# Patient Record
Sex: Female | Born: 1935 | Race: White | Hispanic: No | Marital: Married | Smoking: Former smoker
Health system: Southern US, Community
[De-identification: ages and names within clinical notes are randomized; demographics above are authoritative.]

## PROBLEM LIST (undated history)

## (undated) DIAGNOSIS — I10 Essential (primary) hypertension: Secondary | ICD-10-CM

## (undated) DIAGNOSIS — R570 Cardiogenic shock: Secondary | ICD-10-CM

## (undated) DIAGNOSIS — E785 Hyperlipidemia, unspecified: Secondary | ICD-10-CM

## (undated) DIAGNOSIS — I2510 Atherosclerotic heart disease of native coronary artery without angina pectoris: Secondary | ICD-10-CM

## (undated) DIAGNOSIS — R001 Bradycardia, unspecified: Secondary | ICD-10-CM

## (undated) DIAGNOSIS — J9600 Acute respiratory failure, unspecified whether with hypoxia or hypercapnia: Secondary | ICD-10-CM

## (undated) DIAGNOSIS — F329 Major depressive disorder, single episode, unspecified: Secondary | ICD-10-CM

## (undated) DIAGNOSIS — Z9861 Coronary angioplasty status: Secondary | ICD-10-CM

## (undated) DIAGNOSIS — I480 Paroxysmal atrial fibrillation: Secondary | ICD-10-CM

## (undated) DIAGNOSIS — I639 Cerebral infarction, unspecified: Secondary | ICD-10-CM

## (undated) DIAGNOSIS — I951 Orthostatic hypotension: Secondary | ICD-10-CM

## (undated) DIAGNOSIS — G43909 Migraine, unspecified, not intractable, without status migrainosus: Secondary | ICD-10-CM

## (undated) DIAGNOSIS — F32A Depression, unspecified: Secondary | ICD-10-CM

## (undated) DIAGNOSIS — D649 Anemia, unspecified: Secondary | ICD-10-CM

## (undated) DIAGNOSIS — I255 Ischemic cardiomyopathy: Secondary | ICD-10-CM

## (undated) DIAGNOSIS — E039 Hypothyroidism, unspecified: Secondary | ICD-10-CM

## (undated) HISTORY — DX: Cerebral infarction, unspecified: I63.9

## (undated) HISTORY — PX: SHOULDER SURGERY: SHX246

---

## 1998-03-21 ENCOUNTER — Other Ambulatory Visit: Admission: RE | Admit: 1998-03-21 | Discharge: 1998-03-21 | Payer: Self-pay | Admitting: Obstetrics and Gynecology

## 1998-07-11 ENCOUNTER — Ambulatory Visit (HOSPITAL_COMMUNITY): Admission: RE | Admit: 1998-07-11 | Discharge: 1998-07-11 | Payer: Self-pay | Admitting: Gastroenterology

## 1998-10-10 ENCOUNTER — Ambulatory Visit (HOSPITAL_COMMUNITY): Admission: RE | Admit: 1998-10-10 | Discharge: 1998-10-10 | Payer: Self-pay | Admitting: Specialist

## 1998-10-10 ENCOUNTER — Encounter: Payer: Self-pay | Admitting: Specialist

## 1999-05-15 ENCOUNTER — Other Ambulatory Visit: Admission: RE | Admit: 1999-05-15 | Discharge: 1999-05-15 | Payer: Self-pay | Admitting: Obstetrics and Gynecology

## 1999-09-09 ENCOUNTER — Encounter: Admission: RE | Admit: 1999-09-09 | Discharge: 1999-09-09 | Payer: Self-pay | Admitting: Obstetrics and Gynecology

## 1999-09-09 ENCOUNTER — Encounter: Payer: Self-pay | Admitting: Obstetrics and Gynecology

## 1999-09-30 ENCOUNTER — Encounter (INDEPENDENT_AMBULATORY_CARE_PROVIDER_SITE_OTHER): Payer: Self-pay

## 1999-09-30 ENCOUNTER — Other Ambulatory Visit: Admission: RE | Admit: 1999-09-30 | Discharge: 1999-09-30 | Payer: Self-pay | Admitting: Obstetrics and Gynecology

## 2000-03-10 ENCOUNTER — Encounter: Admission: RE | Admit: 2000-03-10 | Discharge: 2000-03-10 | Payer: Self-pay | Admitting: Obstetrics and Gynecology

## 2000-03-10 ENCOUNTER — Encounter: Payer: Self-pay | Admitting: Obstetrics and Gynecology

## 2000-04-19 ENCOUNTER — Other Ambulatory Visit: Admission: RE | Admit: 2000-04-19 | Discharge: 2000-04-19 | Payer: Self-pay | Admitting: Obstetrics and Gynecology

## 2000-11-03 ENCOUNTER — Encounter: Payer: Self-pay | Admitting: Gastroenterology

## 2000-11-03 ENCOUNTER — Encounter: Admission: RE | Admit: 2000-11-03 | Discharge: 2000-11-03 | Payer: Self-pay | Admitting: Gastroenterology

## 2000-12-22 ENCOUNTER — Encounter (INDEPENDENT_AMBULATORY_CARE_PROVIDER_SITE_OTHER): Payer: Self-pay

## 2000-12-22 ENCOUNTER — Ambulatory Visit (HOSPITAL_COMMUNITY): Admission: RE | Admit: 2000-12-22 | Discharge: 2000-12-22 | Payer: Self-pay | Admitting: Obstetrics and Gynecology

## 2001-03-14 ENCOUNTER — Encounter: Admission: RE | Admit: 2001-03-14 | Discharge: 2001-03-14 | Payer: Self-pay | Admitting: Gastroenterology

## 2001-03-14 ENCOUNTER — Encounter: Payer: Self-pay | Admitting: Gastroenterology

## 2001-04-25 ENCOUNTER — Other Ambulatory Visit: Admission: RE | Admit: 2001-04-25 | Discharge: 2001-04-25 | Payer: Self-pay | Admitting: Obstetrics and Gynecology

## 2001-04-27 ENCOUNTER — Encounter: Payer: Self-pay | Admitting: Obstetrics and Gynecology

## 2001-04-27 ENCOUNTER — Encounter: Admission: RE | Admit: 2001-04-27 | Discharge: 2001-04-27 | Payer: Self-pay | Admitting: Obstetrics and Gynecology

## 2002-03-21 ENCOUNTER — Encounter: Payer: Self-pay | Admitting: Obstetrics and Gynecology

## 2002-03-21 ENCOUNTER — Encounter: Admission: RE | Admit: 2002-03-21 | Discharge: 2002-03-21 | Payer: Self-pay | Admitting: Obstetrics and Gynecology

## 2002-05-01 ENCOUNTER — Other Ambulatory Visit: Admission: RE | Admit: 2002-05-01 | Discharge: 2002-05-01 | Payer: Self-pay | Admitting: Obstetrics and Gynecology

## 2003-04-02 ENCOUNTER — Encounter: Payer: Self-pay | Admitting: Obstetrics and Gynecology

## 2003-04-02 ENCOUNTER — Encounter: Admission: RE | Admit: 2003-04-02 | Discharge: 2003-04-02 | Payer: Self-pay | Admitting: Obstetrics and Gynecology

## 2003-05-08 ENCOUNTER — Other Ambulatory Visit: Admission: RE | Admit: 2003-05-08 | Discharge: 2003-05-08 | Payer: Self-pay | Admitting: Obstetrics and Gynecology

## 2003-05-30 ENCOUNTER — Encounter: Payer: Self-pay | Admitting: Emergency Medicine

## 2003-05-30 ENCOUNTER — Emergency Department (HOSPITAL_COMMUNITY): Admission: EM | Admit: 2003-05-30 | Discharge: 2003-05-30 | Payer: Self-pay | Admitting: Emergency Medicine

## 2003-06-11 ENCOUNTER — Encounter: Admission: RE | Admit: 2003-06-11 | Discharge: 2003-06-11 | Payer: Self-pay | Admitting: Gastroenterology

## 2003-06-11 ENCOUNTER — Encounter: Payer: Self-pay | Admitting: Gastroenterology

## 2003-08-10 ENCOUNTER — Ambulatory Visit (HOSPITAL_BASED_OUTPATIENT_CLINIC_OR_DEPARTMENT_OTHER): Admission: RE | Admit: 2003-08-10 | Discharge: 2003-08-10 | Payer: Self-pay | Admitting: Gastroenterology

## 2004-01-10 ENCOUNTER — Ambulatory Visit (HOSPITAL_COMMUNITY): Admission: RE | Admit: 2004-01-10 | Discharge: 2004-01-10 | Payer: Self-pay | Admitting: Family Medicine

## 2004-04-18 ENCOUNTER — Encounter: Admission: RE | Admit: 2004-04-18 | Discharge: 2004-04-18 | Payer: Self-pay | Admitting: Gastroenterology

## 2004-05-16 ENCOUNTER — Encounter: Admission: RE | Admit: 2004-05-16 | Discharge: 2004-05-16 | Payer: Self-pay | Admitting: Obstetrics and Gynecology

## 2005-03-04 ENCOUNTER — Ambulatory Visit: Payer: Self-pay | Admitting: Internal Medicine

## 2005-06-01 ENCOUNTER — Encounter: Admission: RE | Admit: 2005-06-01 | Discharge: 2005-06-01 | Payer: Self-pay | Admitting: Obstetrics and Gynecology

## 2006-06-07 ENCOUNTER — Encounter: Admission: RE | Admit: 2006-06-07 | Discharge: 2006-06-07 | Payer: Self-pay | Admitting: Obstetrics and Gynecology

## 2006-12-30 ENCOUNTER — Encounter: Admission: RE | Admit: 2006-12-30 | Discharge: 2006-12-30 | Payer: Self-pay | Admitting: Gastroenterology

## 2007-06-29 ENCOUNTER — Encounter: Admission: RE | Admit: 2007-06-29 | Discharge: 2007-06-29 | Payer: Self-pay | Admitting: Obstetrics and Gynecology

## 2008-07-11 ENCOUNTER — Encounter: Admission: RE | Admit: 2008-07-11 | Discharge: 2008-07-11 | Payer: Self-pay | Admitting: Obstetrics and Gynecology

## 2008-07-30 ENCOUNTER — Ambulatory Visit (HOSPITAL_COMMUNITY): Admission: RE | Admit: 2008-07-30 | Discharge: 2008-07-30 | Payer: Self-pay | Admitting: Gastroenterology

## 2008-08-06 ENCOUNTER — Emergency Department (HOSPITAL_COMMUNITY): Admission: EM | Admit: 2008-08-06 | Discharge: 2008-08-06 | Payer: Self-pay | Admitting: Emergency Medicine

## 2009-02-15 ENCOUNTER — Encounter: Admission: RE | Admit: 2009-02-15 | Discharge: 2009-02-15 | Payer: Self-pay | Admitting: Gastroenterology

## 2009-07-31 ENCOUNTER — Encounter: Admission: RE | Admit: 2009-07-31 | Discharge: 2009-07-31 | Payer: Self-pay | Admitting: Obstetrics and Gynecology

## 2010-08-05 ENCOUNTER — Encounter: Admission: RE | Admit: 2010-08-05 | Discharge: 2010-08-05 | Payer: Self-pay | Admitting: Obstetrics and Gynecology

## 2010-11-08 ENCOUNTER — Encounter: Payer: Self-pay | Admitting: Obstetrics and Gynecology

## 2011-02-03 ENCOUNTER — Other Ambulatory Visit (HOSPITAL_COMMUNITY)
Admission: RE | Admit: 2011-02-03 | Discharge: 2011-02-03 | Disposition: A | Discharge status: Home / Self Care | Payer: Medicare Other | Source: Ambulatory Visit | Attending: Internal Medicine | Admitting: Internal Medicine

## 2011-02-03 ENCOUNTER — Other Ambulatory Visit: Payer: Self-pay | Admitting: Internal Medicine

## 2011-02-03 DIAGNOSIS — Z124 Encounter for screening for malignant neoplasm of cervix: Secondary | ICD-10-CM | POA: Insufficient documentation

## 2011-02-03 DIAGNOSIS — Z1159 Encounter for screening for other viral diseases: Secondary | ICD-10-CM | POA: Insufficient documentation

## 2011-03-06 NOTE — Op Note (Signed)
[facility] [place] [facility] [place]  Patient:    Melinda Noble, Melinda Noble                          MRN: [id number] Proc. Date: 12/22/00 Adm. Date:  [id number] Attending:  Morene Antu                           Operative Report  PREOPERATIVE DIAGNOSES:       1. Postmenopausal bleeding.                               2. Endocervical mass.  POSTOPERATIVE DIAGNOSES:      1. Postmenopausal bleeding.                               2. Endocervical mass.  PROCEDURE:                    Dilation and curettage hysteroscopy through                               resectoscopic excision of cervical mass.  SURGEON:                      Sherry A. Rosalio Macadamia, M.D.  ANESTHESIA:                   MAC.  INDICATIONS:                  This is a 77 year old G4, P4-0-0-4 woman, who has had intermittent postmenopausal bleeding for several months.  The patient was initially treated by lowering her estrogen dose and increasing her progesterone dose.  The patient had approximately one month of no bleeding; however, the bleeding has persisted since the middle of January.  Because of this, an ultrasound was performed.  Ultrasound revealed normal uterus with endometrial stripe; however, there was an approximately .5 cm mass in the high endocervical cavity.  Because of this, the patient is brought to the operating room for [facility] hysteroscopy with the resectoscope.  FINDINGS:                     Normal size anteflexed uterus, high endocervical mass.  DESCRIPTION OF PROCEDURE:     The patient was brought into the operating room and given adequate IV sedation.  She was placed in dorsal lithotomy position. Her perineum was washed with Hibiclens.  Pelvic examination was performed. The patient was draped in a sterile fashion.  Speculum was placed within the vagina.  The vagina was washed with Hibiclens.  Paracervical block was administered with 1% Nesacaine.  Anterior lip of the cervix was grasped with  a single-tooth tenaculum.  The cervix was sounded.  The cervix was dilated with Pratt dilators to a #31.  Hysteroscope was easily introduced into the endometrial cavity.  Pictures were obtained.  Superficial samples in the endometrium were obtained circumferentially.  The cervical mass was then excised using double-loop right-angled resector.  This was excised in its entirety.  Final picture was obtained.  The speculum was placed back in the vagina.  Adequate hemostasis was present.  All instruments were removed from the vagina.  The patient was taken out of the dorsal lithotomy  position.  She was awakened.  She was moved from the operating table to a stretcher in stable condition.  Complications were none.  Estimated blood loss less than 5 cc. Sorbitol differential -210. DD:  12/22/00 TD:  12/22/00 Job: [id number]

## 2011-07-03 ENCOUNTER — Other Ambulatory Visit: Payer: Self-pay | Admitting: Internal Medicine

## 2011-07-03 DIAGNOSIS — Z1231 Encounter for screening mammogram for malignant neoplasm of breast: Secondary | ICD-10-CM

## 2011-08-11 ENCOUNTER — Ambulatory Visit
Admission: RE | Admit: 2011-08-11 | Discharge: 2011-08-11 | Disposition: A | Discharge status: Home / Self Care | Payer: Medicare Other | Source: Ambulatory Visit | Attending: Internal Medicine | Admitting: Internal Medicine

## 2011-08-11 DIAGNOSIS — Z1231 Encounter for screening mammogram for malignant neoplasm of breast: Secondary | ICD-10-CM

## 2011-08-14 ENCOUNTER — Other Ambulatory Visit: Payer: Self-pay | Admitting: Gastroenterology

## 2011-10-29 DIAGNOSIS — L820 Inflamed seborrheic keratosis: Secondary | ICD-10-CM | POA: Diagnosis not present

## 2011-12-24 DIAGNOSIS — L820 Inflamed seborrheic keratosis: Secondary | ICD-10-CM | POA: Diagnosis not present

## 2011-12-24 DIAGNOSIS — L738 Other specified follicular disorders: Secondary | ICD-10-CM | POA: Diagnosis not present

## 2011-12-24 DIAGNOSIS — L259 Unspecified contact dermatitis, unspecified cause: Secondary | ICD-10-CM | POA: Diagnosis not present

## 2012-01-28 DIAGNOSIS — L259 Unspecified contact dermatitis, unspecified cause: Secondary | ICD-10-CM | POA: Diagnosis not present

## 2012-01-28 DIAGNOSIS — B079 Viral wart, unspecified: Secondary | ICD-10-CM | POA: Diagnosis not present

## 2012-02-08 DIAGNOSIS — I10 Essential (primary) hypertension: Secondary | ICD-10-CM | POA: Diagnosis not present

## 2012-02-08 DIAGNOSIS — E039 Hypothyroidism, unspecified: Secondary | ICD-10-CM | POA: Diagnosis not present

## 2012-02-08 DIAGNOSIS — R05 Cough: Secondary | ICD-10-CM | POA: Diagnosis not present

## 2012-02-08 DIAGNOSIS — M76899 Other specified enthesopathies of unspecified lower limb, excluding foot: Secondary | ICD-10-CM | POA: Diagnosis not present

## 2012-02-08 DIAGNOSIS — K219 Gastro-esophageal reflux disease without esophagitis: Secondary | ICD-10-CM | POA: Diagnosis not present

## 2012-02-08 DIAGNOSIS — R0789 Other chest pain: Secondary | ICD-10-CM | POA: Diagnosis not present

## 2012-02-08 DIAGNOSIS — Z1331 Encounter for screening for depression: Secondary | ICD-10-CM | POA: Diagnosis not present

## 2012-02-08 DIAGNOSIS — Z86010 Personal history of colonic polyps: Secondary | ICD-10-CM | POA: Diagnosis not present

## 2012-02-08 DIAGNOSIS — Z0000 Encounter for general adult medical examination without abnormal findings: Secondary | ICD-10-CM | POA: Diagnosis not present

## 2012-02-08 DIAGNOSIS — E559 Vitamin D deficiency, unspecified: Secondary | ICD-10-CM | POA: Diagnosis not present

## 2012-02-08 DIAGNOSIS — Z79899 Other long term (current) drug therapy: Secondary | ICD-10-CM | POA: Diagnosis not present

## 2012-02-09 DIAGNOSIS — J301 Allergic rhinitis due to pollen: Secondary | ICD-10-CM | POA: Diagnosis not present

## 2012-02-09 DIAGNOSIS — Z1331 Encounter for screening for depression: Secondary | ICD-10-CM | POA: Diagnosis not present

## 2012-02-09 DIAGNOSIS — K219 Gastro-esophageal reflux disease without esophagitis: Secondary | ICD-10-CM | POA: Diagnosis not present

## 2012-02-09 DIAGNOSIS — R0789 Other chest pain: Secondary | ICD-10-CM | POA: Diagnosis not present

## 2012-02-09 DIAGNOSIS — Z0000 Encounter for general adult medical examination without abnormal findings: Secondary | ICD-10-CM | POA: Diagnosis not present

## 2012-02-09 DIAGNOSIS — R05 Cough: Secondary | ICD-10-CM | POA: Diagnosis not present

## 2012-02-09 DIAGNOSIS — Z86010 Personal history of colonic polyps: Secondary | ICD-10-CM | POA: Diagnosis not present

## 2012-02-09 DIAGNOSIS — E559 Vitamin D deficiency, unspecified: Secondary | ICD-10-CM | POA: Diagnosis not present

## 2012-03-23 DIAGNOSIS — Z961 Presence of intraocular lens: Secondary | ICD-10-CM | POA: Diagnosis not present

## 2012-03-23 DIAGNOSIS — H26499 Other secondary cataract, unspecified eye: Secondary | ICD-10-CM | POA: Diagnosis not present

## 2012-03-23 DIAGNOSIS — H31009 Unspecified chorioretinal scars, unspecified eye: Secondary | ICD-10-CM | POA: Diagnosis not present

## 2012-03-23 DIAGNOSIS — H1045 Other chronic allergic conjunctivitis: Secondary | ICD-10-CM | POA: Diagnosis not present

## 2012-04-29 DIAGNOSIS — L821 Other seborrheic keratosis: Secondary | ICD-10-CM | POA: Diagnosis not present

## 2012-04-29 DIAGNOSIS — L820 Inflamed seborrheic keratosis: Secondary | ICD-10-CM | POA: Diagnosis not present

## 2012-06-03 DIAGNOSIS — IMO0002 Reserved for concepts with insufficient information to code with codable children: Secondary | ICD-10-CM | POA: Diagnosis not present

## 2012-06-07 DIAGNOSIS — M545 Low back pain: Secondary | ICD-10-CM | POA: Diagnosis not present

## 2012-06-07 DIAGNOSIS — IMO0002 Reserved for concepts with insufficient information to code with codable children: Secondary | ICD-10-CM | POA: Diagnosis not present

## 2012-06-10 DIAGNOSIS — M545 Low back pain: Secondary | ICD-10-CM | POA: Diagnosis not present

## 2012-06-10 DIAGNOSIS — IMO0002 Reserved for concepts with insufficient information to code with codable children: Secondary | ICD-10-CM | POA: Diagnosis not present

## 2012-06-13 DIAGNOSIS — M545 Low back pain: Secondary | ICD-10-CM | POA: Diagnosis not present

## 2012-06-13 DIAGNOSIS — IMO0002 Reserved for concepts with insufficient information to code with codable children: Secondary | ICD-10-CM | POA: Diagnosis not present

## 2012-06-17 DIAGNOSIS — IMO0002 Reserved for concepts with insufficient information to code with codable children: Secondary | ICD-10-CM | POA: Diagnosis not present

## 2012-06-17 DIAGNOSIS — M545 Low back pain: Secondary | ICD-10-CM | POA: Diagnosis not present

## 2012-06-21 DIAGNOSIS — M545 Low back pain: Secondary | ICD-10-CM | POA: Diagnosis not present

## 2012-06-21 DIAGNOSIS — IMO0002 Reserved for concepts with insufficient information to code with codable children: Secondary | ICD-10-CM | POA: Diagnosis not present

## 2012-06-24 DIAGNOSIS — M545 Low back pain: Secondary | ICD-10-CM | POA: Diagnosis not present

## 2012-06-24 DIAGNOSIS — IMO0002 Reserved for concepts with insufficient information to code with codable children: Secondary | ICD-10-CM | POA: Diagnosis not present

## 2012-06-28 DIAGNOSIS — IMO0002 Reserved for concepts with insufficient information to code with codable children: Secondary | ICD-10-CM | POA: Diagnosis not present

## 2012-06-28 DIAGNOSIS — M545 Low back pain: Secondary | ICD-10-CM | POA: Diagnosis not present

## 2012-06-30 DIAGNOSIS — IMO0002 Reserved for concepts with insufficient information to code with codable children: Secondary | ICD-10-CM | POA: Diagnosis not present

## 2012-06-30 DIAGNOSIS — M545 Low back pain: Secondary | ICD-10-CM | POA: Diagnosis not present

## 2012-07-06 ENCOUNTER — Other Ambulatory Visit: Payer: Self-pay | Admitting: Internal Medicine

## 2012-07-06 DIAGNOSIS — IMO0002 Reserved for concepts with insufficient information to code with codable children: Secondary | ICD-10-CM | POA: Diagnosis not present

## 2012-07-06 DIAGNOSIS — M545 Low back pain: Secondary | ICD-10-CM | POA: Diagnosis not present

## 2012-07-06 DIAGNOSIS — Z1231 Encounter for screening mammogram for malignant neoplasm of breast: Secondary | ICD-10-CM

## 2012-07-08 DIAGNOSIS — IMO0002 Reserved for concepts with insufficient information to code with codable children: Secondary | ICD-10-CM | POA: Diagnosis not present

## 2012-07-08 DIAGNOSIS — M545 Low back pain: Secondary | ICD-10-CM | POA: Diagnosis not present

## 2012-07-26 DIAGNOSIS — I10 Essential (primary) hypertension: Secondary | ICD-10-CM | POA: Diagnosis not present

## 2012-07-26 DIAGNOSIS — R252 Cramp and spasm: Secondary | ICD-10-CM | POA: Diagnosis not present

## 2012-07-26 DIAGNOSIS — K5900 Constipation, unspecified: Secondary | ICD-10-CM | POA: Diagnosis not present

## 2012-07-26 DIAGNOSIS — IMO0002 Reserved for concepts with insufficient information to code with codable children: Secondary | ICD-10-CM | POA: Diagnosis not present

## 2012-07-26 DIAGNOSIS — Z0000 Encounter for general adult medical examination without abnormal findings: Secondary | ICD-10-CM | POA: Diagnosis not present

## 2012-07-26 DIAGNOSIS — E559 Vitamin D deficiency, unspecified: Secondary | ICD-10-CM | POA: Diagnosis not present

## 2012-07-26 DIAGNOSIS — Z23 Encounter for immunization: Secondary | ICD-10-CM | POA: Diagnosis not present

## 2012-07-26 DIAGNOSIS — K219 Gastro-esophageal reflux disease without esophagitis: Secondary | ICD-10-CM | POA: Diagnosis not present

## 2012-07-26 DIAGNOSIS — M76899 Other specified enthesopathies of unspecified lower limb, excluding foot: Secondary | ICD-10-CM | POA: Diagnosis not present

## 2012-07-27 DIAGNOSIS — M545 Low back pain: Secondary | ICD-10-CM | POA: Diagnosis not present

## 2012-07-27 DIAGNOSIS — IMO0002 Reserved for concepts with insufficient information to code with codable children: Secondary | ICD-10-CM | POA: Diagnosis not present

## 2012-08-17 ENCOUNTER — Ambulatory Visit
Admission: RE | Admit: 2012-08-17 | Discharge: 2012-08-17 | Disposition: A | Discharge status: Home / Self Care | Payer: Medicare Other | Source: Ambulatory Visit | Attending: Internal Medicine | Admitting: Internal Medicine

## 2012-08-17 DIAGNOSIS — Z1231 Encounter for screening mammogram for malignant neoplasm of breast: Secondary | ICD-10-CM | POA: Diagnosis not present

## 2012-08-26 ENCOUNTER — Other Ambulatory Visit: Payer: Self-pay | Admitting: Dermatology

## 2012-08-26 DIAGNOSIS — L851 Acquired keratosis [keratoderma] palmaris et plantaris: Secondary | ICD-10-CM | POA: Diagnosis not present

## 2012-08-26 DIAGNOSIS — D235 Other benign neoplasm of skin of trunk: Secondary | ICD-10-CM | POA: Diagnosis not present

## 2012-08-26 DIAGNOSIS — Z85828 Personal history of other malignant neoplasm of skin: Secondary | ICD-10-CM | POA: Diagnosis not present

## 2012-10-25 DIAGNOSIS — F4320 Adjustment disorder, unspecified: Secondary | ICD-10-CM | POA: Diagnosis not present

## 2012-11-10 DIAGNOSIS — Z85828 Personal history of other malignant neoplasm of skin: Secondary | ICD-10-CM | POA: Diagnosis not present

## 2012-11-10 DIAGNOSIS — B353 Tinea pedis: Secondary | ICD-10-CM | POA: Diagnosis not present

## 2012-11-10 DIAGNOSIS — L821 Other seborrheic keratosis: Secondary | ICD-10-CM | POA: Diagnosis not present

## 2012-11-10 DIAGNOSIS — D1801 Hemangioma of skin and subcutaneous tissue: Secondary | ICD-10-CM | POA: Diagnosis not present

## 2012-11-10 DIAGNOSIS — L819 Disorder of pigmentation, unspecified: Secondary | ICD-10-CM | POA: Diagnosis not present

## 2012-11-10 DIAGNOSIS — L820 Inflamed seborrheic keratosis: Secondary | ICD-10-CM | POA: Diagnosis not present

## 2012-11-10 DIAGNOSIS — L723 Sebaceous cyst: Secondary | ICD-10-CM | POA: Diagnosis not present

## 2012-11-22 DIAGNOSIS — M25579 Pain in unspecified ankle and joints of unspecified foot: Secondary | ICD-10-CM | POA: Diagnosis not present

## 2012-11-22 DIAGNOSIS — M66369 Spontaneous rupture of flexor tendons, unspecified lower leg: Secondary | ICD-10-CM | POA: Diagnosis not present

## 2012-12-05 DIAGNOSIS — M7660 Achilles tendinitis, unspecified leg: Secondary | ICD-10-CM | POA: Diagnosis not present

## 2012-12-05 DIAGNOSIS — R269 Unspecified abnormalities of gait and mobility: Secondary | ICD-10-CM | POA: Diagnosis not present

## 2012-12-07 DIAGNOSIS — M7660 Achilles tendinitis, unspecified leg: Secondary | ICD-10-CM | POA: Diagnosis not present

## 2012-12-07 DIAGNOSIS — R269 Unspecified abnormalities of gait and mobility: Secondary | ICD-10-CM | POA: Diagnosis not present

## 2012-12-08 DIAGNOSIS — M7660 Achilles tendinitis, unspecified leg: Secondary | ICD-10-CM | POA: Diagnosis not present

## 2012-12-08 DIAGNOSIS — R269 Unspecified abnormalities of gait and mobility: Secondary | ICD-10-CM | POA: Diagnosis not present

## 2012-12-13 DIAGNOSIS — R269 Unspecified abnormalities of gait and mobility: Secondary | ICD-10-CM | POA: Diagnosis not present

## 2012-12-13 DIAGNOSIS — M7660 Achilles tendinitis, unspecified leg: Secondary | ICD-10-CM | POA: Diagnosis not present

## 2012-12-15 DIAGNOSIS — R269 Unspecified abnormalities of gait and mobility: Secondary | ICD-10-CM | POA: Diagnosis not present

## 2012-12-15 DIAGNOSIS — M7660 Achilles tendinitis, unspecified leg: Secondary | ICD-10-CM | POA: Diagnosis not present

## 2012-12-26 DIAGNOSIS — R269 Unspecified abnormalities of gait and mobility: Secondary | ICD-10-CM | POA: Diagnosis not present

## 2012-12-26 DIAGNOSIS — M7660 Achilles tendinitis, unspecified leg: Secondary | ICD-10-CM | POA: Diagnosis not present

## 2013-01-03 DIAGNOSIS — R269 Unspecified abnormalities of gait and mobility: Secondary | ICD-10-CM | POA: Diagnosis not present

## 2013-01-03 DIAGNOSIS — M7660 Achilles tendinitis, unspecified leg: Secondary | ICD-10-CM | POA: Diagnosis not present

## 2013-01-06 DIAGNOSIS — M7660 Achilles tendinitis, unspecified leg: Secondary | ICD-10-CM | POA: Diagnosis not present

## 2013-01-06 DIAGNOSIS — R269 Unspecified abnormalities of gait and mobility: Secondary | ICD-10-CM | POA: Diagnosis not present

## 2013-01-09 DIAGNOSIS — M25579 Pain in unspecified ankle and joints of unspecified foot: Secondary | ICD-10-CM | POA: Diagnosis not present

## 2013-01-09 DIAGNOSIS — M25519 Pain in unspecified shoulder: Secondary | ICD-10-CM | POA: Diagnosis not present

## 2013-01-10 DIAGNOSIS — R269 Unspecified abnormalities of gait and mobility: Secondary | ICD-10-CM | POA: Diagnosis not present

## 2013-01-10 DIAGNOSIS — M7660 Achilles tendinitis, unspecified leg: Secondary | ICD-10-CM | POA: Diagnosis not present

## 2013-01-11 DIAGNOSIS — I10 Essential (primary) hypertension: Secondary | ICD-10-CM | POA: Diagnosis not present

## 2013-01-11 DIAGNOSIS — F329 Major depressive disorder, single episode, unspecified: Secondary | ICD-10-CM | POA: Diagnosis not present

## 2013-01-18 DIAGNOSIS — M5412 Radiculopathy, cervical region: Secondary | ICD-10-CM | POA: Diagnosis not present

## 2013-01-18 DIAGNOSIS — M75120 Complete rotator cuff tear or rupture of unspecified shoulder, not specified as traumatic: Secondary | ICD-10-CM | POA: Diagnosis not present

## 2013-01-27 ENCOUNTER — Emergency Department (HOSPITAL_COMMUNITY): Payer: Medicare Other

## 2013-01-27 ENCOUNTER — Encounter (HOSPITAL_COMMUNITY): Payer: Self-pay | Admitting: Emergency Medicine

## 2013-01-27 ENCOUNTER — Emergency Department (HOSPITAL_COMMUNITY)
Admission: EM | Admit: 2013-01-27 | Discharge: 2013-01-27 | Disposition: A | Discharge status: Home / Self Care | Payer: Medicare Other | Attending: Emergency Medicine | Admitting: Emergency Medicine

## 2013-01-27 DIAGNOSIS — M545 Low back pain: Secondary | ICD-10-CM | POA: Diagnosis not present

## 2013-01-27 DIAGNOSIS — M542 Cervicalgia: Secondary | ICD-10-CM

## 2013-01-27 DIAGNOSIS — S298XXA Other specified injuries of thorax, initial encounter: Secondary | ICD-10-CM | POA: Diagnosis not present

## 2013-01-27 DIAGNOSIS — S0993XA Unspecified injury of face, initial encounter: Secondary | ICD-10-CM | POA: Diagnosis not present

## 2013-01-27 DIAGNOSIS — Y9389 Activity, other specified: Secondary | ICD-10-CM | POA: Insufficient documentation

## 2013-01-27 DIAGNOSIS — S0990XA Unspecified injury of head, initial encounter: Secondary | ICD-10-CM | POA: Diagnosis not present

## 2013-01-27 DIAGNOSIS — G44309 Post-traumatic headache, unspecified, not intractable: Secondary | ICD-10-CM | POA: Diagnosis not present

## 2013-01-27 DIAGNOSIS — R51 Headache: Secondary | ICD-10-CM | POA: Diagnosis not present

## 2013-01-27 DIAGNOSIS — IMO0002 Reserved for concepts with insufficient information to code with codable children: Secondary | ICD-10-CM | POA: Insufficient documentation

## 2013-01-27 DIAGNOSIS — T148XXA Other injury of unspecified body region, initial encounter: Secondary | ICD-10-CM | POA: Diagnosis not present

## 2013-01-27 DIAGNOSIS — Y92410 Unspecified street and highway as the place of occurrence of the external cause: Secondary | ICD-10-CM | POA: Insufficient documentation

## 2013-01-27 DIAGNOSIS — S199XXA Unspecified injury of neck, initial encounter: Secondary | ICD-10-CM | POA: Insufficient documentation

## 2013-01-27 HISTORY — DX: Essential (primary) hypertension: I10

## 2013-01-27 MED ORDER — METHOCARBAMOL 500 MG PO TABS: 500.0000 mg | ORAL_TABLET | Freq: Two times a day (BID) | ORAL | Status: DC | PRN

## 2013-01-27 MED ORDER — OXYCODONE-ACETAMINOPHEN 5-325 MG PO TABS: 1.0000 | ORAL_TABLET | ORAL | Status: DC | PRN

## 2013-01-27 NOTE — ED Notes (Signed)
Patient transported to X-ray 
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
## 2013-01-28 NOTE — ED Provider Notes (Signed)
Medical screening examination/treatment/procedure(s) were conducted as a shared visit with non-physician practitioner(s) and myself.  I personally evaluated the patient during the encounter   Loren Racer, MD 01/28/13 [phone]

## 2013-01-30 DIAGNOSIS — M19019 Primary osteoarthritis, unspecified shoulder: Secondary | ICD-10-CM | POA: Diagnosis not present

## 2013-02-08 DIAGNOSIS — F329 Major depressive disorder, single episode, unspecified: Secondary | ICD-10-CM | POA: Diagnosis not present

## 2013-02-08 DIAGNOSIS — Z79899 Other long term (current) drug therapy: Secondary | ICD-10-CM | POA: Diagnosis not present

## 2013-02-08 DIAGNOSIS — I10 Essential (primary) hypertension: Secondary | ICD-10-CM | POA: Diagnosis not present

## 2013-02-08 DIAGNOSIS — Z0000 Encounter for general adult medical examination without abnormal findings: Secondary | ICD-10-CM | POA: Diagnosis not present

## 2013-02-08 DIAGNOSIS — IMO0002 Reserved for concepts with insufficient information to code with codable children: Secondary | ICD-10-CM | POA: Diagnosis not present

## 2013-02-08 DIAGNOSIS — Z1331 Encounter for screening for depression: Secondary | ICD-10-CM | POA: Diagnosis not present

## 2013-02-08 DIAGNOSIS — E039 Hypothyroidism, unspecified: Secondary | ICD-10-CM | POA: Diagnosis not present

## 2013-02-08 DIAGNOSIS — R252 Cramp and spasm: Secondary | ICD-10-CM | POA: Diagnosis not present

## 2013-02-08 DIAGNOSIS — E559 Vitamin D deficiency, unspecified: Secondary | ICD-10-CM | POA: Diagnosis not present

## 2013-02-08 DIAGNOSIS — K219 Gastro-esophageal reflux disease without esophagitis: Secondary | ICD-10-CM | POA: Diagnosis not present

## 2013-02-22 DIAGNOSIS — Z85828 Personal history of other malignant neoplasm of skin: Secondary | ICD-10-CM | POA: Diagnosis not present

## 2013-02-22 DIAGNOSIS — L259 Unspecified contact dermatitis, unspecified cause: Secondary | ICD-10-CM | POA: Diagnosis not present

## 2013-02-22 DIAGNOSIS — L940 Localized scleroderma [morphea]: Secondary | ICD-10-CM | POA: Diagnosis not present

## 2013-02-22 DIAGNOSIS — L821 Other seborrheic keratosis: Secondary | ICD-10-CM | POA: Diagnosis not present

## 2013-02-22 DIAGNOSIS — L738 Other specified follicular disorders: Secondary | ICD-10-CM | POA: Diagnosis not present

## 2013-02-22 DIAGNOSIS — L723 Sebaceous cyst: Secondary | ICD-10-CM | POA: Diagnosis not present

## 2013-05-10 ENCOUNTER — Emergency Department (HOSPITAL_COMMUNITY)
Admission: EM | Admit: 2013-05-10 | Discharge: 2013-05-10 | Disposition: A | Discharge status: Home / Self Care | Payer: Medicare Other | Attending: Emergency Medicine | Admitting: Emergency Medicine

## 2013-05-10 ENCOUNTER — Encounter (HOSPITAL_COMMUNITY): Payer: Self-pay | Admitting: Emergency Medicine

## 2013-05-10 DIAGNOSIS — Z79899 Other long term (current) drug therapy: Secondary | ICD-10-CM | POA: Diagnosis not present

## 2013-05-10 DIAGNOSIS — I10 Essential (primary) hypertension: Secondary | ICD-10-CM | POA: Diagnosis not present

## 2013-05-10 DIAGNOSIS — R110 Nausea: Secondary | ICD-10-CM | POA: Insufficient documentation

## 2013-05-10 DIAGNOSIS — H538 Other visual disturbances: Secondary | ICD-10-CM | POA: Insufficient documentation

## 2013-05-10 DIAGNOSIS — R51 Headache: Secondary | ICD-10-CM | POA: Insufficient documentation

## 2013-05-10 HISTORY — DX: Major depressive disorder, single episode, unspecified: F32.9

## 2013-05-10 HISTORY — DX: Depression, unspecified: F32.A

## 2013-05-10 HISTORY — DX: Migraine, unspecified, not intractable, without status migrainosus: G43.909

## 2013-05-10 MED ORDER — METOCLOPRAMIDE HCL 5 MG/ML IJ SOLN
10.0000 mg | Freq: Once | INTRAMUSCULAR | Status: AC
  Administered 2013-05-10: 10 mg via INTRAMUSCULAR
  Filled 2013-05-10: qty 2

## 2013-05-10 MED ORDER — DIPHENHYDRAMINE HCL 50 MG/ML IJ SOLN
25.0000 mg | Freq: Once | INTRAMUSCULAR | Status: AC
  Administered 2013-05-10: 25 mg via INTRAVENOUS
  Filled 2013-05-10: qty 1

## 2013-05-10 MED ORDER — TRAMADOL HCL 50 MG PO TABS: 50.0000 mg | ORAL_TABLET | Freq: Four times a day (QID) | ORAL | Status: DC | PRN

## 2013-05-10 MED ORDER — SODIUM CHLORIDE 0.9 % IV BOLUS (SEPSIS)
1000.0000 mL | Freq: Once | INTRAVENOUS | Status: AC
  Administered 2013-05-10: 1000 mL via INTRAVENOUS

## 2013-05-10 MED ORDER — IBUPROFEN 400 MG PO TABS
600.0000 mg | ORAL_TABLET | Freq: Once | ORAL | Status: AC
  Administered 2013-05-10: 600 mg via ORAL
  Filled 2013-05-10 (×2): qty 1

## 2013-05-10 MED ORDER — METOCLOPRAMIDE HCL 10 MG PO TABS: 10.0000 mg | ORAL_TABLET | Freq: Four times a day (QID) | ORAL | Status: DC | PRN

## 2013-05-10 NOTE — ED Provider Notes (Signed)
History    CSN: [id number] Arrival date & time 05/10/13  0459  First MD Initiated Contact with Patient 05/10/13 [phone]     Chief Complaint  Patient presents with  . Headache   (Consider location/radiation/quality/duration/timing/severity/associated sxs/prior Treatment) The history is provided by the patient.   79 year old female was awakened at 1 AM by severe headache at her vertex. TEE headache as a dull pain with associated photophobia. There is nausea but no vomiting. Headache is similar to migraines she has had in the past but she states it has been many years since she's had a migraine. Headache was rated at 9/10 at its worst but it has subsided somewhat and is now rated at 8/10. Nothing has made headache any better. She denies any visual disturbance, weakness, numbness. Past Medical History  Diagnosis Date  . Hypertension    History reviewed. No pertinent past surgical history. No family history on file. History  Substance Use Topics  . Smoking status: Never Smoker   . Smokeless tobacco: Not on file  . Alcohol Use: No   OB History   Grav Para Term Preterm Abortions TAB SAB Ect Mult Living                 Review of Systems  All other systems reviewed and are negative.    Allergies  Iohexol; Penicillins; and Sulfa antibiotics  Home Medications   Current Outpatient Rx  Name  Route  Sig  Dispense  Refill  . atenolol (TENORMIN) 25 MG tablet   Oral   Take 25 mg by mouth daily.         . Biotin 1000 MCG tablet   Oral   Take 1,000 mcg by mouth daily.         . calcium-vitamin D (OSCAL WITH D) 500-200 MG-UNIT per tablet   Oral   Take 2 tablets by mouth daily.         . cholecalciferol (VITAMIN D) 1000 UNITS tablet   Oral   Take 1,000 Units by mouth daily.         . clorazepate (TRANXENE) 3.75 MG tablet   Oral   Take 0.9375 mg by mouth 2 (two) times daily as needed for anxiety.         Marland Kitchen levothyroxine (SYNTHROID, LEVOTHROID) 100 MCG tablet    Oral   Take 100 mcg by mouth daily before breakfast.         . methocarbamol (ROBAXIN) 500 MG tablet   Oral   Take 1 tablet (500 mg total) by mouth 2 (two) times daily as needed.   20 tablet   0   . Multiple Vitamins-Minerals (MULTIVITAMIN WITH MINERALS) tablet   Oral   Take 1 tablet by mouth daily.         Marland Kitchen oxyCODONE-acetaminophen (PERCOCET/ROXICET) 5-325 MG per tablet   Oral   Take 1 tablet by mouth every 4 (four) hours as needed for pain.   15 tablet   0   . sertraline (ZOLOFT) 50 MG tablet   Oral   Take 50 mg by mouth daily.         . vitamin E (VITAMIN E) 400 UNIT capsule   Oral   Take 400 Units by mouth daily.          BP 205/81  Temp(Src) 98.1 F (36.7 C) (Oral)  Resp 18  SpO2 100% Physical Exam  Nursing note and vitals reviewed.  79 year old female, resting comfortably and in no  acute distress. Vital signs are significant for hypertension with pressure 25/81. Oxygen saturation is 100%, which is normal. Head is normocephalic and atraumatic. PERRLA, EOMI. Oropharynx is clear. Fundi show no hemorrhage, exudate, or papilledema. There is no tenderness to palpation over the temporal arteries. Neck is nontender and supple without adenopathy or JVD. Back is nontender and there is no CVA tenderness. Lungs are clear without rales, wheezes, or rhonchi. Chest is nontender. Heart has regular rate and rhythm without murmur. Abdomen is soft, flat, nontender without masses or hepatosplenomegaly and peristalsis is normoactive. Extremities have no cyanosis or edema, full range of motion is present. Skin is warm and dry without rash. Neurologic: Mental status is normal, cranial nerves are intact, there are no motor or sensory deficits.  ED Course  Procedures (including critical care time)  1. Headache   2. Hypertension     MDM  Headache which is probably a migraine headache. I reviewed her old records and she had an MRA in 2008 showing no evidence of any aneurysm  is. She will be given a headache cocktail and reassessed.  She got excellent relief of her headache with IV fluids and IV metoclopramide with diphenhydramine. Blood pressure has come down to under 180 systolic indicating that at least a portion of her hypertension was related to her degree of pain. She is discharged with prescription for metoclopramide and is to followup with her PCP  Dione Booze, MD 05/10/13 [phone]

## 2013-05-10 NOTE — ED Notes (Addendum)
PT. WOKE UP AT 1 AM THIS MORNING WITH HEADACHE AND NAUSEA , DENIES INJURY , NO BLURRED VISION ,FEVER OR CHILLS. HYPERTENSIVE AT ARRIVAL.

## 2013-05-10 NOTE — ED Notes (Signed)
Pt is here with headache to forehead area that woke her up with nausea.  Pt pain has improved.  No drifts, weakness, vision change, slurred speech, or facial droop.  Ambulatory to bathroom.  Pupils 3 RRB

## 2013-06-26 DIAGNOSIS — M7660 Achilles tendinitis, unspecified leg: Secondary | ICD-10-CM | POA: Diagnosis not present

## 2013-06-28 DIAGNOSIS — M7660 Achilles tendinitis, unspecified leg: Secondary | ICD-10-CM | POA: Diagnosis not present

## 2013-07-03 DIAGNOSIS — M7660 Achilles tendinitis, unspecified leg: Secondary | ICD-10-CM | POA: Diagnosis not present

## 2013-07-06 DIAGNOSIS — M7660 Achilles tendinitis, unspecified leg: Secondary | ICD-10-CM | POA: Diagnosis not present

## 2013-07-11 ENCOUNTER — Other Ambulatory Visit: Payer: Self-pay

## 2013-07-11 DIAGNOSIS — Z1231 Encounter for screening mammogram for malignant neoplasm of breast: Secondary | ICD-10-CM

## 2013-07-12 DIAGNOSIS — M7660 Achilles tendinitis, unspecified leg: Secondary | ICD-10-CM | POA: Diagnosis not present

## 2013-07-27 DIAGNOSIS — M7660 Achilles tendinitis, unspecified leg: Secondary | ICD-10-CM | POA: Diagnosis not present

## 2013-08-01 DIAGNOSIS — M7660 Achilles tendinitis, unspecified leg: Secondary | ICD-10-CM | POA: Diagnosis not present

## 2013-08-08 DIAGNOSIS — Z23 Encounter for immunization: Secondary | ICD-10-CM | POA: Diagnosis not present

## 2013-08-09 DIAGNOSIS — M7660 Achilles tendinitis, unspecified leg: Secondary | ICD-10-CM | POA: Diagnosis not present

## 2013-08-10 DIAGNOSIS — L821 Other seborrheic keratosis: Secondary | ICD-10-CM | POA: Diagnosis not present

## 2013-08-10 DIAGNOSIS — D1801 Hemangioma of skin and subcutaneous tissue: Secondary | ICD-10-CM | POA: Diagnosis not present

## 2013-08-10 DIAGNOSIS — L723 Sebaceous cyst: Secondary | ICD-10-CM | POA: Diagnosis not present

## 2013-08-10 DIAGNOSIS — L570 Actinic keratosis: Secondary | ICD-10-CM | POA: Diagnosis not present

## 2013-08-10 DIAGNOSIS — D239 Other benign neoplasm of skin, unspecified: Secondary | ICD-10-CM | POA: Diagnosis not present

## 2013-08-10 DIAGNOSIS — Z85828 Personal history of other malignant neoplasm of skin: Secondary | ICD-10-CM | POA: Diagnosis not present

## 2013-08-10 DIAGNOSIS — L820 Inflamed seborrheic keratosis: Secondary | ICD-10-CM | POA: Diagnosis not present

## 2013-08-10 DIAGNOSIS — L819 Disorder of pigmentation, unspecified: Secondary | ICD-10-CM | POA: Diagnosis not present

## 2013-08-16 DIAGNOSIS — E559 Vitamin D deficiency, unspecified: Secondary | ICD-10-CM | POA: Diagnosis not present

## 2013-08-16 DIAGNOSIS — R252 Cramp and spasm: Secondary | ICD-10-CM | POA: Diagnosis not present

## 2013-08-16 DIAGNOSIS — I10 Essential (primary) hypertension: Secondary | ICD-10-CM | POA: Diagnosis not present

## 2013-08-16 DIAGNOSIS — IMO0002 Reserved for concepts with insufficient information to code with codable children: Secondary | ICD-10-CM | POA: Diagnosis not present

## 2013-08-16 DIAGNOSIS — E039 Hypothyroidism, unspecified: Secondary | ICD-10-CM | POA: Diagnosis not present

## 2013-08-16 DIAGNOSIS — F4320 Adjustment disorder, unspecified: Secondary | ICD-10-CM | POA: Diagnosis not present

## 2013-08-16 DIAGNOSIS — K5900 Constipation, unspecified: Secondary | ICD-10-CM | POA: Diagnosis not present

## 2013-08-16 DIAGNOSIS — M7660 Achilles tendinitis, unspecified leg: Secondary | ICD-10-CM | POA: Diagnosis not present

## 2013-08-16 DIAGNOSIS — K219 Gastro-esophageal reflux disease without esophagitis: Secondary | ICD-10-CM | POA: Diagnosis not present

## 2013-08-21 ENCOUNTER — Ambulatory Visit
Admission: RE | Admit: 2013-08-21 | Discharge: 2013-08-21 | Disposition: A | Discharge status: Home / Self Care | Payer: Medicare Other | Source: Ambulatory Visit

## 2013-08-21 DIAGNOSIS — Z1231 Encounter for screening mammogram for malignant neoplasm of breast: Secondary | ICD-10-CM | POA: Diagnosis not present

## 2013-08-23 DIAGNOSIS — M7660 Achilles tendinitis, unspecified leg: Secondary | ICD-10-CM | POA: Diagnosis not present

## 2013-08-30 DIAGNOSIS — M7660 Achilles tendinitis, unspecified leg: Secondary | ICD-10-CM | POA: Diagnosis not present

## 2013-08-30 DIAGNOSIS — R269 Unspecified abnormalities of gait and mobility: Secondary | ICD-10-CM | POA: Diagnosis not present

## 2013-09-04 DIAGNOSIS — M7660 Achilles tendinitis, unspecified leg: Secondary | ICD-10-CM | POA: Diagnosis not present

## 2013-09-04 DIAGNOSIS — R269 Unspecified abnormalities of gait and mobility: Secondary | ICD-10-CM | POA: Diagnosis not present

## 2013-10-09 ENCOUNTER — Emergency Department (HOSPITAL_COMMUNITY): Payer: Medicare Other

## 2013-10-09 ENCOUNTER — Ambulatory Visit (HOSPITAL_COMMUNITY): Admit: 2013-10-09 | Payer: Self-pay | Admitting: Cardiology

## 2013-10-09 ENCOUNTER — Inpatient Hospital Stay (HOSPITAL_COMMUNITY)
Admission: EM | Admit: 2013-10-09 | Discharge: 2013-10-17 | DRG: 246 | Disposition: A | Discharge status: Skilled Nursing Facility | Payer: Medicare Other | Attending: Cardiology | Admitting: Cardiology

## 2013-10-09 ENCOUNTER — Other Ambulatory Visit: Payer: Self-pay | Admitting: Internal Medicine

## 2013-10-09 ENCOUNTER — Encounter (HOSPITAL_COMMUNITY): Payer: Self-pay | Admitting: Emergency Medicine

## 2013-10-09 ENCOUNTER — Encounter (HOSPITAL_COMMUNITY)
Admission: EM | Disposition: A | Discharge status: Skilled Nursing Facility | Payer: Self-pay | Source: Home / Self Care | Attending: Cardiology

## 2013-10-09 DIAGNOSIS — I501 Left ventricular failure: Secondary | ICD-10-CM

## 2013-10-09 DIAGNOSIS — K5900 Constipation, unspecified: Secondary | ICD-10-CM | POA: Diagnosis not present

## 2013-10-09 DIAGNOSIS — I4891 Unspecified atrial fibrillation: Secondary | ICD-10-CM | POA: Diagnosis not present

## 2013-10-09 DIAGNOSIS — M549 Dorsalgia, unspecified: Secondary | ICD-10-CM | POA: Diagnosis not present

## 2013-10-09 DIAGNOSIS — E872 Acidosis, unspecified: Secondary | ICD-10-CM

## 2013-10-09 DIAGNOSIS — I509 Heart failure, unspecified: Secondary | ICD-10-CM | POA: Diagnosis not present

## 2013-10-09 DIAGNOSIS — I2582 Chronic total occlusion of coronary artery: Secondary | ICD-10-CM | POA: Diagnosis present

## 2013-10-09 DIAGNOSIS — J9601 Acute respiratory failure with hypoxia: Secondary | ICD-10-CM

## 2013-10-09 DIAGNOSIS — E874 Mixed disorder of acid-base balance: Secondary | ICD-10-CM | POA: Diagnosis not present

## 2013-10-09 DIAGNOSIS — I2109 ST elevation (STEMI) myocardial infarction involving other coronary artery of anterior wall: Principal | ICD-10-CM | POA: Diagnosis present

## 2013-10-09 DIAGNOSIS — R935 Abnormal findings on diagnostic imaging of other abdominal regions, including retroperitoneum: Secondary | ICD-10-CM | POA: Diagnosis not present

## 2013-10-09 DIAGNOSIS — R0902 Hypoxemia: Secondary | ICD-10-CM | POA: Diagnosis present

## 2013-10-09 DIAGNOSIS — Z87891 Personal history of nicotine dependence: Secondary | ICD-10-CM

## 2013-10-09 DIAGNOSIS — E039 Hypothyroidism, unspecified: Secondary | ICD-10-CM | POA: Diagnosis present

## 2013-10-09 DIAGNOSIS — IMO0002 Reserved for concepts with insufficient information to code with codable children: Secondary | ICD-10-CM | POA: Diagnosis not present

## 2013-10-09 DIAGNOSIS — Z955 Presence of coronary angioplasty implant and graft: Secondary | ICD-10-CM

## 2013-10-09 DIAGNOSIS — I2510 Atherosclerotic heart disease of native coronary artery without angina pectoris: Secondary | ICD-10-CM | POA: Diagnosis present

## 2013-10-09 DIAGNOSIS — J9600 Acute respiratory failure, unspecified whether with hypoxia or hypercapnia: Secondary | ICD-10-CM | POA: Diagnosis not present

## 2013-10-09 DIAGNOSIS — R918 Other nonspecific abnormal finding of lung field: Secondary | ICD-10-CM | POA: Diagnosis not present

## 2013-10-09 DIAGNOSIS — I10 Essential (primary) hypertension: Secondary | ICD-10-CM

## 2013-10-09 DIAGNOSIS — J9819 Other pulmonary collapse: Secondary | ICD-10-CM | POA: Diagnosis not present

## 2013-10-09 DIAGNOSIS — Z79899 Other long term (current) drug therapy: Secondary | ICD-10-CM

## 2013-10-09 DIAGNOSIS — R071 Chest pain on breathing: Secondary | ICD-10-CM | POA: Diagnosis not present

## 2013-10-09 DIAGNOSIS — Z4682 Encounter for fitting and adjustment of non-vascular catheter: Secondary | ICD-10-CM | POA: Diagnosis not present

## 2013-10-09 DIAGNOSIS — I480 Paroxysmal atrial fibrillation: Secondary | ICD-10-CM

## 2013-10-09 DIAGNOSIS — K8020 Calculus of gallbladder without cholecystitis without obstruction: Secondary | ICD-10-CM | POA: Diagnosis not present

## 2013-10-09 DIAGNOSIS — Z882 Allergy status to sulfonamides status: Secondary | ICD-10-CM

## 2013-10-09 DIAGNOSIS — T41205A Adverse effect of unspecified general anesthetics, initial encounter: Secondary | ICD-10-CM | POA: Diagnosis not present

## 2013-10-09 DIAGNOSIS — I498 Other specified cardiac arrhythmias: Secondary | ICD-10-CM | POA: Diagnosis present

## 2013-10-09 DIAGNOSIS — E785 Hyperlipidemia, unspecified: Secondary | ICD-10-CM

## 2013-10-09 DIAGNOSIS — R7309 Other abnormal glucose: Secondary | ICD-10-CM | POA: Diagnosis not present

## 2013-10-09 DIAGNOSIS — R109 Unspecified abdominal pain: Secondary | ICD-10-CM

## 2013-10-09 DIAGNOSIS — I469 Cardiac arrest, cause unspecified: Secondary | ICD-10-CM

## 2013-10-09 DIAGNOSIS — Z91041 Radiographic dye allergy status: Secondary | ICD-10-CM

## 2013-10-09 DIAGNOSIS — M79609 Pain in unspecified limb: Secondary | ICD-10-CM | POA: Diagnosis not present

## 2013-10-09 DIAGNOSIS — I255 Ischemic cardiomyopathy: Secondary | ICD-10-CM

## 2013-10-09 DIAGNOSIS — T380X5A Adverse effect of glucocorticoids and synthetic analogues, initial encounter: Secondary | ICD-10-CM | POA: Diagnosis not present

## 2013-10-09 DIAGNOSIS — Z880 Allergy status to penicillin: Secondary | ICD-10-CM

## 2013-10-09 DIAGNOSIS — I219 Acute myocardial infarction, unspecified: Secondary | ICD-10-CM

## 2013-10-09 DIAGNOSIS — I2542 Coronary artery dissection: Secondary | ICD-10-CM | POA: Diagnosis not present

## 2013-10-09 DIAGNOSIS — G43909 Migraine, unspecified, not intractable, without status migrainosus: Secondary | ICD-10-CM | POA: Diagnosis present

## 2013-10-09 DIAGNOSIS — R570 Cardiogenic shock: Secondary | ICD-10-CM

## 2013-10-09 DIAGNOSIS — I2589 Other forms of chronic ischemic heart disease: Secondary | ICD-10-CM | POA: Diagnosis not present

## 2013-10-09 DIAGNOSIS — F329 Major depressive disorder, single episode, unspecified: Secondary | ICD-10-CM | POA: Diagnosis present

## 2013-10-09 DIAGNOSIS — Y840 Cardiac catheterization as the cause of abnormal reaction of the patient, or of later complication, without mention of misadventure at the time of the procedure: Secondary | ICD-10-CM | POA: Diagnosis not present

## 2013-10-09 DIAGNOSIS — I213 ST elevation (STEMI) myocardial infarction of unspecified site: Secondary | ICD-10-CM

## 2013-10-09 DIAGNOSIS — I517 Cardiomegaly: Secondary | ICD-10-CM | POA: Diagnosis not present

## 2013-10-09 DIAGNOSIS — J811 Chronic pulmonary edema: Secondary | ICD-10-CM | POA: Diagnosis present

## 2013-10-09 DIAGNOSIS — F3289 Other specified depressive episodes: Secondary | ICD-10-CM | POA: Diagnosis present

## 2013-10-09 HISTORY — DX: Acute respiratory failure, unspecified whether with hypoxia or hypercapnia: J96.00

## 2013-10-09 HISTORY — PX: CORONARY ANGIOPLASTY WITH STENT PLACEMENT: SHX49

## 2013-10-09 HISTORY — PX: LEFT HEART CATHETERIZATION WITH CORONARY ANGIOGRAM: SHX5451

## 2013-10-09 HISTORY — DX: Coronary angioplasty status: Z98.61

## 2013-10-09 HISTORY — DX: Atherosclerotic heart disease of native coronary artery without angina pectoris: I25.10

## 2013-10-09 HISTORY — DX: Ischemic cardiomyopathy: I25.5

## 2013-10-09 LAB — BASIC METABOLIC PANEL
CO2: 22 mEq/L (ref 19–32)
Calcium: 10.3 mg/dL (ref 8.4–10.5)
Creatinine, Ser: 0.87 mg/dL (ref 0.50–1.10)
Glucose, Bld: 147 mg/dL — ABNORMAL HIGH (ref 70–99)

## 2013-10-09 LAB — CBC
HCT: 44.9 % (ref 36.0–46.0)
Hemoglobin: 14.9 g/dL (ref 12.0–15.0)
MCH: 30.7 pg (ref 26.0–34.0)
MCV: 92.6 fL (ref 78.0–100.0)
RBC: 4.85 MIL/uL (ref 3.87–5.11)

## 2013-10-09 LAB — APTT: aPTT: 33 seconds (ref 24–37)

## 2013-10-09 LAB — PROTIME-INR
INR: 0.94 (ref 0.00–1.49)
Prothrombin Time: 12.4 seconds (ref 11.6–15.2)

## 2013-10-09 LAB — POCT I-STAT TROPONIN I: Troponin i, poc: 1.21 ng/mL (ref 0.00–0.08)

## 2013-10-09 SURGERY — LEFT HEART CATHETERIZATION WITH CORONARY ANGIOGRAM
Anesthesia: LOCAL

## 2013-10-09 MED ORDER — DIPHENHYDRAMINE HCL 50 MG/ML IJ SOLN
50.0000 mg | Freq: Once | INTRAMUSCULAR | Status: AC
  Administered 2013-10-09: 50 mg via INTRAVENOUS
  Filled 2013-10-09: qty 1

## 2013-10-09 MED ORDER — MORPHINE SULFATE 4 MG/ML IJ SOLN: 6.0000 mg | Freq: Once | INTRAMUSCULAR | Status: DC

## 2013-10-09 MED ORDER — MORPHINE SULFATE 4 MG/ML IJ SOLN
INTRAMUSCULAR | Status: AC
  Filled 2013-10-09: qty 1

## 2013-10-09 MED ORDER — MORPHINE SULFATE 2 MG/ML IJ SOLN
INTRAMUSCULAR | Status: AC
  Administered 2013-10-09: 6 mg via INTRAVENOUS
  Filled 2013-10-09: qty 1

## 2013-10-09 MED ORDER — HEPARIN SODIUM (PORCINE) 1000 UNIT/ML IJ SOLN
4000.0000 [IU] | Freq: Once | INTRAMUSCULAR | Status: AC
  Administered 2013-10-09: 4000 [IU] via INTRAVENOUS
  Filled 2013-10-09: qty 4

## 2013-10-09 MED ORDER — ASPIRIN 81 MG PO CHEW
324.0000 mg | CHEWABLE_TABLET | Freq: Once | ORAL | Status: AC
  Administered 2013-10-09: 324 mg via ORAL
  Filled 2013-10-09: qty 4

## 2013-10-09 MED ORDER — ONDANSETRON HCL 4 MG/2ML IJ SOLN
4.0000 mg | Freq: Once | INTRAMUSCULAR | Status: AC
  Administered 2013-10-09: 4 mg via INTRAVENOUS

## 2013-10-09 MED ORDER — FENTANYL CITRATE 0.05 MG/ML IJ SOLN
50.0000 ug | Freq: Once | INTRAMUSCULAR | Status: AC
  Administered 2013-10-09: 50 ug via INTRAVENOUS
  Filled 2013-10-09: qty 2

## 2013-10-09 MED ORDER — ASPIRIN 81 MG PO CHEW
324.0000 mg | CHEWABLE_TABLET | Freq: Once | ORAL | Status: DC
  Filled 2013-10-09: qty 4

## 2013-10-09 MED ORDER — ONDANSETRON HCL 4 MG/2ML IJ SOLN
INTRAMUSCULAR | Status: AC
  Filled 2013-10-09: qty 2

## 2013-10-09 MED ORDER — FENTANYL CITRATE 0.05 MG/ML IJ SOLN
INTRAMUSCULAR | Status: AC
  Filled 2013-10-09: qty 2

## 2013-10-09 MED ORDER — IOHEXOL 350 MG/ML SOLN
100.0000 mL | Freq: Once | INTRAVENOUS | Status: AC | PRN
  Administered 2013-10-09: 100 mL via INTRAVENOUS

## 2013-10-09 MED ORDER — SODIUM CHLORIDE 0.9 % IV BOLUS (SEPSIS)
1000.0000 mL | Freq: Once | INTRAVENOUS | Status: AC
  Administered 2013-10-09: 1000 mL via INTRAVENOUS

## 2013-10-09 MED ORDER — MIDAZOLAM HCL 2 MG/2ML IJ SOLN
INTRAMUSCULAR | Status: AC
  Filled 2013-10-09: qty 2

## 2013-10-09 MED ORDER — METHYLPREDNISOLONE SODIUM SUCC 125 MG IJ SOLR
125.0000 mg | Freq: Once | INTRAMUSCULAR | Status: AC
  Administered 2013-10-09: 125 mg via INTRAVENOUS
  Filled 2013-10-09: qty 2

## 2013-10-09 MED ORDER — ONDANSETRON HCL 4 MG/2ML IJ SOLN: 4.0000 mg | Freq: Once | INTRAMUSCULAR | Status: DC

## 2013-10-09 NOTE — ED Notes (Signed)
Pt states that this morning she started having extreme pain between her shoulder blades that radiates to her chest. States that she had an ekg done at her PCP but doc said that she could be having problems with her gall bladder or her aorta. Nausea.

## 2013-10-09 NOTE — ED Notes (Addendum)
Patient returned to room from CT, new EKG obtained and given to [facility].

## 2013-10-09 NOTE — ED Notes (Signed)
EDP Campos at bedside; order to activate code STEMI. [facility] Paged

## 2013-10-09 NOTE — ED Notes (Signed)
Dr Juleen China notified of elevated troponin

## 2013-10-09 NOTE — ED Notes (Addendum)
Patient transported to CT with RN and MD at CT bedside

## 2013-10-09 NOTE — H&P (Signed)
History and Physical  Patient ID: Melinda Noble MRN: [id number], DOB: [birth year] Date of Encounter: 10/10/2013, 3:31 AM Primary Physician: Pearla Dubonnet, MD Primary Cardiologist: None  Chief Complaint: Chest pain Reason for Admission: STEMI  HPI: 79 year old woman with history of HTN and hypothyroidism, who presented to the [facility] ED with back pain that began this morning.  Over the course of the day, the pain became more localized to the center of her chest and has been accompanied by nausea.  She denies a history of prior heart disease.  Initial EKG showed 1 mm of anterolateral ST elevation without reciprocal changes in the setting of borderline LVH.  Due to the patient's initial pain being located in the back and accompanied by markedly elevated blood pressure, CTA of the chest and abdomen was performed without evidence of aortic dissection.  However, the ST elevation became progressively more pronounced on subsequent EKG's leading a STEMI alert being sent at 11:13 PM.  Her initial troponin at [facility] was 1.21.  The patient was brought to the cardiac cath lab where she would found to have an occlusion of the mid-LAD.  After initial PCI to the mid-LAD, poor reflow was noted further attempts to open the mid/distal LAD with PTCA and additional sent placement was performed.  During the case, the patient became progressively more agitated followed by periods of apnea, prompting intubation and mechanical ventilation.  She has also required norepinephrine for blood pressure support following intubation.  Past Medical History  Diagnosis Date  . Hypertension   . Migraine headache   . Thyroid disease   . Depression      Most Recent Cardiac Studies: CTA chest (10/09/13): No evidence of aortic dissection.  Coronary artery calcifications present.   Surgical History:  Past Surgical History  Procedure Laterality Date  . Shoulder surgery       Home Meds: Prior to Admission medications    Medication Sig Start Date Nasiya Pascual Date Taking? Authorizing Provider  atenolol (TENORMIN) 50 MG tablet Take 25 mg by mouth 2 (two) times daily.    Historical Provider, MD  Biotin 1000 MCG tablet Take 1,000 mcg by mouth daily.    Historical Provider, MD  calcium-vitamin D (OSCAL WITH D) 500-200 MG-UNIT per tablet Take 2 tablets by mouth daily.    Historical Provider, MD  cholecalciferol (VITAMIN D) 1000 UNITS tablet Take 1,000 Units by mouth daily.    Historical Provider, MD  clorazepate (TRANXENE) 3.75 MG tablet Take 0.9375 mg by mouth 2 (two) times daily as needed for anxiety.    Historical Provider, MD  levothyroxine (SYNTHROID, LEVOTHROID) 100 MCG tablet Take 100 mcg by mouth daily before breakfast.    Historical Provider, MD  metoCLOPramide (REGLAN) 10 MG tablet Take 1 tablet (10 mg total) by mouth every 6 (six) hours as needed (Nausea or headache). 05/10/13   Dione Booze, MD  Multiple Vitamins-Minerals (MULTIVITAMIN WITH MINERALS) tablet Take 1 tablet by mouth daily.    Historical Provider, MD  omeprazole (PRILOSEC) 20 MG capsule Take 20 mg by mouth daily.    Historical Provider, MD  sertraline (ZOLOFT) 50 MG tablet Take 50 mg by mouth daily.    Historical Provider, MD  traMADol (ULTRAM) 50 MG tablet Take 1 tablet (50 mg total) by mouth every 6 (six) hours as needed for pain. 05/10/13   Dione Booze, MD  vitamin E (VITAMIN E) 400 UNIT capsule Take 400 Units by mouth daily.    Historical Provider, MD    Allergies:  Allergies  Allergen Reactions  . Iohexol      Code: HIVES, Desc: anaphylactic shock s/p contrast injection many yrs ago--suggested that pt NEVER have iv contrast//a.c., Onset Date: [id number]   . Penicillins Nausea Only  . Sulfa Antibiotics Rash    History   Social History  . Marital Status: Married    Spouse Name: N/A    Number of Children: N/A  . Years of Education: N/A   Occupational History  . Not on file.   Social History Main Topics  . Smoking status: Former Games developer    . Smokeless tobacco: Not on file  . Alcohol Use: No  . Drug Use: No  . Sexual Activity: Not on file   Other Topics Concern  . Not on file   Social History Narrative  . No narrative on file     No family history on file.  Review of Systems: Unable to obtain due to patient's critical illness, intubation, and mechanical ventilation.    Labs:   Lab Results  Component Value Date   WBC 11.6* 10/09/2013   HGB 14.9 10/09/2013   HCT 44.9 10/09/2013   MCV 92.6 10/09/2013   PLT 243 10/09/2013     Recent Labs Lab 10/09/13 2220  NA 136  K 3.8  CL 102  CO2 22  BUN 26*  CREATININE 0.87  CALCIUM 10.3  GLUCOSE 147*   Tn: 1.21  Radiology/Studies:  Ct Angio Chest W/cm &/or Wo Cm  10/09/2013   CLINICAL DATA:  Chest and abdominal pain.  Assess for dissection.  EXAM: CT ANGIOGRAPHY CHEST, ABDOMEN AND PELVIS  TECHNIQUE: Multidetector CT imaging through the chest, abdomen and pelvis was performed using the standard protocol during bolus administration of intravenous contrast. Multiplanar reconstructed images including MIPs were obtained and reviewed to evaluate the vascular anatomy.  CONTRAST:  OMNIPAQUE IOHEXOL 350 MG/ML SOLN  COMPARISON:  None.  FINDINGS: CTA CHEST FINDINGS  There is no evidence of aortic dissection. Minimal calcific atherosclerotic disease is noted along the aortic arch and descending thoracic aorta, with minimal thrombus along the descending thoracic aorta. There is no evidence of aneurysmal dilatation. Aside from mild calcification along the proximal great vessels, the great vessels are grossly unremarkable in appearance.  Evaluation for pulmonary embolus is markedly limited given the phase of contrast enhancement; no central pulmonary embolus is seen.  Mild bilateral dependent subsegmental atelectasis is noted, most prominent along the medial aspect of the right lung. This appears to reflect underlying osteophytes. There is no evidence of pleural effusion or  pneumothorax. No masses are identified; no abnormal focal contrast enhancement is seen.  The mediastinum is unremarkable in appearance. No mediastinal lymphadenopathy is seen. No pericardial effusion is identified. No axillary lymphadenopathy is seen. The diminutive thyroid gland is unremarkable in appearance.  No acute osseous abnormalities are seen.  Review of the MIP images confirms the above findings.  CTA ABDOMEN AND PELVIS FINDINGS  There is no evidence of aortic dissection. Scattered calcific atherosclerotic disease is noted along the abdominal aorta and its branches. The celiac trunk, superior mesenteric artery, bilateral renal arteries and inferior mesenteric artery remain patent. Calcification is most prominent at the origin of the superior mesenteric artery, with perhaps mild associated luminal narrowing.  The liver and spleen are unremarkable in appearance. Several large stones are seen within the gallbladder; the gallbladder is otherwise unremarkable in appearance. The pancreas and adrenal glands are unremarkable.  The kidneys are unremarkable in appearance. There is no evidence of hydronephrosis.  No renal or ureteral stones are seen. No perinephric stranding is appreciated.  No free fluid is identified. The small bowel is unremarkable in appearance. The stomach is within normal limits. No acute vascular abnormalities are seen.  The appendix is not definitely seen; there is no evidence for appendicitis. Scattered diverticulosis is noted along the distal descending and sigmoid colon, without evidence of diverticulitis.  The bladder is mildly distended and grossly unremarkable in appearance. The uterus is within normal limits. The ovaries are relatively symmetric; no suspicious adnexal masses are seen. No inguinal lymphadenopathy is seen.  No acute osseous abnormalities are identified.  Review of the MIP images confirms the above findings.  IMPRESSION: 1. No evidence of aortic dissection. Mild calcific  atherosclerotic disease more prominent within the abdomen. Calcification is most prominent at the origin of the superior mesenteric artery, with perhaps mild associated luminal narrowing. 2. No central pulmonary embolus seen, though evaluation for pulmonary embolus is limited given the phase of contrast enhancement. 3. Mild bilateral dependent subsegmental atelectasis noted; lungs otherwise clear. 4. Cholelithiasis; gallbladder otherwise unremarkable in appearance. 5. Scattered diverticulosis along the distal descending and sigmoid colon, without evidence of diverticulitis.   Electronically Signed   By: Roanna Raider M.D.   On: 10/09/2013 23:27    EKG: NSR with LAD and anterolateral ST elevation (1-2 mm V2-V6).  Physical Exam: Blood pressure 167/59, pulse 131, temperature 98.1 F (36.7 C), temperature source Oral, resp. rate 18, height 5\' 6"  (1.676 m), weight 95.255 kg (210 lb), SpO2 91.00%. General: Overweight, elderly woman lying in bed.  She is intubated and sedated following LHC. Head: ETT in place.  PERRL  Neck: Negative for carotid bruits. JVD not elevated. Lungs: Clear anteriorly without wheezes or crackles Heart: RRR with S1 S2. No murmurs, rubs, or gallops appreciated. Abdomen: Soft, non-tender, non-distended with normoactive bowel sounds. No hepatomegaly. No rebound/guarding. No obvious abdominal masses. Msk:  Unable to assess due to intubation/sedation. Extremities: No clubbing or cyanosis. No edema.  Distal pedal pulses are 2+ and equal bilaterally.  Right femoral venous and arterial sheaths in place with small, soft overlying hematoma.  Right wrist TR band in place.  2+ left radial and 1+ PT/DP pulses bilaterally. Neuro: Intubated and sedated.  Prior to [facility], patient was alert and oriented.  She was able to move all 4 extremities. Psych:  Unable to assess.    ASSESSMENT AND PLAN:  79 y/o woman with h/o HTN and hypothyroidism, admitted following emergent LHC for STEMI with mid-LAD  occlusion.  Cardiac:  Patient s/p DES x 2 to the mid/distal LAD with poor reflow noted on initial post-PCI images.  She remains on tirofiban, as P2Y12 inhibitor could not be administered during LHC.  She remains hypotensive, requiring norepinephrine to maintain MAP's above 65.  - Admit to cardiac ICU  - Load and continue ticagrelor once enteric access has been obtained and continue ASA 81 mg daily  - Plan to complete 18 hour course of tirofiban, given extensive LAD clot burden  - Continue norepinephrine for goal MAP > 65  - Consider adding dopamine for BP and inotropic support, particularly if patient remains bradycardiac  - Obtain transthoracic echocardiogram in AM to assess LV function  - Given hypotension requiring vasopressor support, will not initiate beta-blocker or ACEI/ARB at this time  - Initiate atorvastatin 80 mg daily  - Consider gentle diuresis as blood pressure allows  - Right femoral venous and arterial sheath to be maintained until additional arterial and central venous  access can be obtained in the morning, following washout of bivalirudin  - Trend troponins until they have peaked, then stop  - Obtain lipid panel and hemoglobin A1c for further risk stratification  Pulm: Patient intubated for airway protection and hypoxic respiratory failure in the setting of apnea.  - CCM consulted for assistance with vent management; appreciate recommendations  - Obtain CXR to confirm ETT placement and to evaluate for interval development of pulmonary edema  - ABG now  - Diurese as blood pressure allows.  Renal: No evidence of renal dysfunction thus far, though patient received large IV contrast load with CTA and LHC.  - Foley catheter in place for careful I/O monitoring  - Avoid nephrotoxic agents  ID: No evidence of acute infection; hypotension most likely related to cardiogenic shock.  Suspect mild leukocytosis is due to MI.  - Obtain CXR  - Obtain UA with reflex culture  - Consider  empiric antibiotics if patient becomes febrile or has worsening hemodynamic instability  GI/FEN: Enteric access placed with OG tube.  - Start GI prophylaxis with PPI  - Defer tube feeds at this time given critical illness with high pressor requirements  Endocrine:  Patient has history of hypothyroidism.  - Continue home levothyroxine dose  - Glucose monitoring with SSI as needed  Neuro:  Patient intubated and sedated but no focal deficits prior to intubation.  - Continue midazolam and fentanyl for sedation.  Code status: Full code (patient reportedly has living will but family states that she would want to undergo CPR if condition is potentially reversible).  Signed, [place] Arroyave A. MD  10/10/2013, 3:31 AM

## 2013-10-09 NOTE — ED Provider Notes (Signed)
CSN: [id number]     Arrival date & time 10/09/13  2210 History   First MD Initiated Contact with Patient 10/09/13 2219     Chief Complaint  Patient presents with  . Pain between Shoulders    (Consider location/radiation/quality/duration/timing/severity/associated sxs/prior Treatment) HPI  79yF with back pain. Onset this morning. Pain is between shoulder blades and radiates into lower back and both arms. Has been constant since onset. No CP. No SOB. No diaphoresis. Nausea.  Never had pain like this before. No unusual leg pain or swelling. No fever or chills. Saw PCP for the same today and was told concerned that either her gallbladder or her aorta. Hx of HTN.  Former smoker. No known CAD. Reports previous stress testing years ago which she reports as routine and thinks was normal. Took two tylenol PTA because of her symptoms. Not on ASA or other thinners.   Past Medical History  Diagnosis Date  . Hypertension   . Migraine headache   . Thyroid disease   . Depression    Past Surgical History  Procedure Laterality Date  . Shoulder surgery     No family history on file. History  Substance Use Topics  . Smoking status: Former Games developer  . Smokeless tobacco: Not on file  . Alcohol Use: No   OB History   Grav Para Term Preterm Abortions TAB SAB Ect Mult Living                 Review of Systems  All systems reviewed and negative, other than as noted in HPI.   Allergies  Iohexol; Penicillins; and Sulfa antibiotics  Home Medications   Current Outpatient Rx  Name  Route  Sig  Dispense  Refill  . atenolol (TENORMIN) 50 MG tablet   Oral   Take 25 mg by mouth 2 (two) times daily.         . Biotin 1000 MCG tablet   Oral   Take 1,000 mcg by mouth daily.         . calcium-vitamin D (OSCAL WITH D) 500-200 MG-UNIT per tablet   Oral   Take 2 tablets by mouth daily.         . cholecalciferol (VITAMIN D) 1000 UNITS tablet   Oral   Take 1,000 Units by mouth daily.          . clorazepate (TRANXENE) 3.75 MG tablet   Oral   Take 0.9375 mg by mouth 2 (two) times daily as needed for anxiety.         Marland Kitchen levothyroxine (SYNTHROID, LEVOTHROID) 100 MCG tablet   Oral   Take 100 mcg by mouth daily before breakfast.         . metoCLOPramide (REGLAN) 10 MG tablet   Oral   Take 1 tablet (10 mg total) by mouth every 6 (six) hours as needed (Nausea or headache).   30 tablet   0   . Multiple Vitamins-Minerals (MULTIVITAMIN WITH MINERALS) tablet   Oral   Take 1 tablet by mouth daily.         Marland Kitchen omeprazole (PRILOSEC) 20 MG capsule   Oral   Take 20 mg by mouth daily.         . sertraline (ZOLOFT) 50 MG tablet   Oral   Take 50 mg by mouth daily.         . traMADol (ULTRAM) 50 MG tablet   Oral   Take 1 tablet (50 mg total) by mouth every 6 (  six) hours as needed for pain.   15 tablet   0   . vitamin E (VITAMIN E) 400 UNIT capsule   Oral   Take 400 Units by mouth daily.          BP 167/59  Pulse 68  Temp(Src) 98.1 F (36.7 C) (Oral)  Resp 18  SpO2 91% Physical Exam  Nursing note and vitals reviewed. Constitutional:  Laying in bed. Appears uncomfortable.   HENT:  Head: Normocephalic and atraumatic.  Eyes: Conjunctivae are normal. Right eye exhibits no discharge. Left eye exhibits no discharge.  Neck: Neck supple.  Cardiovascular: Normal rate, regular rhythm and normal heart sounds.  Exam reveals no gallop and no friction rub.   No murmur heard. Pulmonary/Chest: Effort normal and breath sounds normal. No respiratory distress. She exhibits no tenderness.  Abdominal: Soft. She exhibits no distension. There is no tenderness.  Musculoskeletal: She exhibits no edema and no tenderness.  Lower extremities symmetric as compared to each other. No calf tenderness. Negative Homan's. No palpable cords.   Neurological: She is alert.  Skin: Skin is warm and dry. She is not diaphoretic.  Psychiatric: She has a normal mood and affect. Her behavior is  normal. Thought content normal.    ED Course  Procedures (including critical care time)  CRITICAL CARE Performed by: Raeford Razor  Total critical care time: 40 minutes  Critical care time was exclusive of separately billable procedures and treating other patients. Critical care was necessary to treat or prevent imminent or life-threatening deterioration. Critical care was time spent personally by me on the following activities: development of treatment plan with patient and/or surrogate as well as nursing, discussions with consultants, evaluation of patient's response to treatment, examination of patient, obtaining history from patient or surrogate, ordering and performing treatments and interventions, ordering and review of laboratory studies, ordering and review of radiographic studies, pulse oximetry and re-evaluation of patient's condition. .  Labs Review Labs Reviewed  CBC - Abnormal; Notable for the following:    WBC 11.6 (*)    All other components within normal limits  POCT I-STAT TROPONIN I - Abnormal; Notable for the following:    Troponin i, poc 1.21 (*)    All other components within normal limits  BASIC METABOLIC PANEL  TYPE AND SCREEN   Imaging Review Ct Angio Chest W/cm &/or Wo Cm  10/09/2013   CLINICAL DATA:  Chest and abdominal pain.  Assess for dissection.  EXAM: CT ANGIOGRAPHY CHEST, ABDOMEN AND PELVIS  TECHNIQUE: Multidetector CT imaging through the chest, abdomen and pelvis was performed using the standard protocol during bolus administration of intravenous contrast. Multiplanar reconstructed images including MIPs were obtained and reviewed to evaluate the vascular anatomy.  CONTRAST:  OMNIPAQUE IOHEXOL 350 MG/ML SOLN  COMPARISON:  None.  FINDINGS: CTA CHEST FINDINGS  There is no evidence of aortic dissection. Minimal calcific atherosclerotic disease is noted along the aortic arch and descending thoracic aorta, with minimal thrombus along the descending  thoracic aorta. There is no evidence of aneurysmal dilatation. Aside from mild calcification along the proximal great vessels, the great vessels are grossly unremarkable in appearance.  Evaluation for pulmonary embolus is markedly limited given the phase of contrast enhancement; no central pulmonary embolus is seen.  Mild bilateral dependent subsegmental atelectasis is noted, most prominent along the medial aspect of the right lung. This appears to reflect underlying osteophytes. There is no evidence of pleural effusion or pneumothorax. No masses are identified; no abnormal focal contrast  enhancement is seen.  The mediastinum is unremarkable in appearance. No mediastinal lymphadenopathy is seen. No pericardial effusion is identified. No axillary lymphadenopathy is seen. The diminutive thyroid gland is unremarkable in appearance.  No acute osseous abnormalities are seen.  Review of the MIP images confirms the above findings.  CTA ABDOMEN AND PELVIS FINDINGS  There is no evidence of aortic dissection. Scattered calcific atherosclerotic disease is noted along the abdominal aorta and its branches. The celiac trunk, superior mesenteric artery, bilateral renal arteries and inferior mesenteric artery remain patent. Calcification is most prominent at the origin of the superior mesenteric artery, with perhaps mild associated luminal narrowing.  The liver and spleen are unremarkable in appearance. Several large stones are seen within the gallbladder; the gallbladder is otherwise unremarkable in appearance. The pancreas and adrenal glands are unremarkable.  The kidneys are unremarkable in appearance. There is no evidence of hydronephrosis. No renal or ureteral stones are seen. No perinephric stranding is appreciated.  No free fluid is identified. The small bowel is unremarkable in appearance. The stomach is within normal limits. No acute vascular abnormalities are seen.  The appendix is not definitely seen; there is no  evidence for appendicitis. Scattered diverticulosis is noted along the distal descending and sigmoid colon, without evidence of diverticulitis.  The bladder is mildly distended and grossly unremarkable in appearance. The uterus is within normal limits. The ovaries are relatively symmetric; no suspicious adnexal masses are seen. No inguinal lymphadenopathy is seen.  No acute osseous abnormalities are identified.  Review of the MIP images confirms the above findings.  IMPRESSION: 1. No evidence of aortic dissection. Mild calcific atherosclerotic disease more prominent within the abdomen. Calcification is most prominent at the origin of the superior mesenteric artery, with perhaps mild associated luminal narrowing. 2. No central pulmonary embolus seen, though evaluation for pulmonary embolus is limited given the phase of contrast enhancement. 3. Mild bilateral dependent subsegmental atelectasis noted; lungs otherwise clear. 4. Cholelithiasis; gallbladder otherwise unremarkable in appearance. 5. Scattered diverticulosis along the distal descending and sigmoid colon, without evidence of diverticulitis.   Electronically Signed   By: Roanna Raider M.D.   On: 10/09/2013 23:27   Ct Angio Abdomen W/cm &/or Wo Contrast  10/09/2013   CLINICAL DATA:  Chest and abdominal pain.  Assess for dissection.  EXAM: CT ANGIOGRAPHY CHEST, ABDOMEN AND PELVIS  TECHNIQUE: Multidetector CT imaging through the chest, abdomen and pelvis was performed using the standard protocol during bolus administration of intravenous contrast. Multiplanar reconstructed images including MIPs were obtained and reviewed to evaluate the vascular anatomy.  CONTRAST:  OMNIPAQUE IOHEXOL 350 MG/ML SOLN  COMPARISON:  None.  FINDINGS: CTA CHEST FINDINGS  There is no evidence of aortic dissection. Minimal calcific atherosclerotic disease is noted along the aortic arch and descending thoracic aorta, with minimal thrombus along the descending thoracic aorta.  There is no evidence of aneurysmal dilatation. Aside from mild calcification along the proximal great vessels, the great vessels are grossly unremarkable in appearance.  Evaluation for pulmonary embolus is markedly limited given the phase of contrast enhancement; no central pulmonary embolus is seen.  Mild bilateral dependent subsegmental atelectasis is noted, most prominent along the medial aspect of the right lung. This appears to reflect underlying osteophytes. There is no evidence of pleural effusion or pneumothorax. No masses are identified; no abnormal focal contrast enhancement is seen.  The mediastinum is unremarkable in appearance. No mediastinal lymphadenopathy is seen. No pericardial effusion is identified. No axillary lymphadenopathy is seen. The  diminutive thyroid gland is unremarkable in appearance.  No acute osseous abnormalities are seen.  Review of the MIP images confirms the above findings.  CTA ABDOMEN AND PELVIS FINDINGS  There is no evidence of aortic dissection. Scattered calcific atherosclerotic disease is noted along the abdominal aorta and its branches. The celiac trunk, superior mesenteric artery, bilateral renal arteries and inferior mesenteric artery remain patent. Calcification is most prominent at the origin of the superior mesenteric artery, with perhaps mild associated luminal narrowing.  The liver and spleen are unremarkable in appearance. Several large stones are seen within the gallbladder; the gallbladder is otherwise unremarkable in appearance. The pancreas and adrenal glands are unremarkable.  The kidneys are unremarkable in appearance. There is no evidence of hydronephrosis. No renal or ureteral stones are seen. No perinephric stranding is appreciated.  No free fluid is identified. The small bowel is unremarkable in appearance. The stomach is within normal limits. No acute vascular abnormalities are seen.  The appendix is not definitely seen; there is no evidence for  appendicitis. Scattered diverticulosis is noted along the distal descending and sigmoid colon, without evidence of diverticulitis.  The bladder is mildly distended and grossly unremarkable in appearance. The uterus is within normal limits. The ovaries are relatively symmetric; no suspicious adnexal masses are seen. No inguinal lymphadenopathy is seen.  No acute osseous abnormalities are identified.  Review of the MIP images confirms the above findings.  IMPRESSION: 1. No evidence of aortic dissection. Mild calcific atherosclerotic disease more prominent within the abdomen. Calcification is most prominent at the origin of the superior mesenteric artery, with perhaps mild associated luminal narrowing. 2. No central pulmonary embolus seen, though evaluation for pulmonary embolus is limited given the phase of contrast enhancement. 3. Mild bilateral dependent subsegmental atelectasis noted; lungs otherwise clear. 4. Cholelithiasis; gallbladder otherwise unremarkable in appearance. 5. Scattered diverticulosis along the distal descending and sigmoid colon, without evidence of diverticulitis.   Electronically Signed   By: Roanna Raider M.D.   On: 10/09/2013 23:27    EKG Interpretation   None      EKG:  Rhythm: normal sinus Rate: 68 PR: 196 ms QRS: 109 ms QTc: 447 ms ST segments: >16mm STE V2-V5  MDM   1. STEMI (ST elevation myocardial infarction)      79 year old female with back pain. Initial clinical concern for aortic dissection. Initial EKGs concerning, but nondiagnostic. Spoke with internationalist, Dr [name]. He reviewed EKGs and then subsequently discussed elevated troponin. Given high suspicion for dissection, procedeed with CT angiography. Solumedrol/benadryl pretreatment because of her history and I accompanied her to CT. Aspirin and heparin were held. CT does not show evidence of a dissection. Patient developing chest pain in the emergency room. Repeat EKG with dynamic changes and  consistent with STEMI. Aspirin/heparin ordered. Transfer to [place] for cardiac catheterization and further management.    Raeford Razor, MD 10/09/13 [phone]

## 2013-10-10 ENCOUNTER — Inpatient Hospital Stay (HOSPITAL_COMMUNITY): Payer: Medicare Other

## 2013-10-10 ENCOUNTER — Encounter (HOSPITAL_COMMUNITY): Payer: Self-pay | Admitting: Anesthesiology

## 2013-10-10 ENCOUNTER — Inpatient Hospital Stay (HOSPITAL_COMMUNITY): Payer: Medicare Other | Admitting: Anesthesiology

## 2013-10-10 ENCOUNTER — Encounter (HOSPITAL_COMMUNITY)
Admission: EM | Disposition: A | Discharge status: Skilled Nursing Facility | Payer: Self-pay | Source: Home / Self Care | Attending: Cardiology

## 2013-10-10 ENCOUNTER — Encounter (HOSPITAL_COMMUNITY): Payer: Medicare Other | Admitting: Anesthesiology

## 2013-10-10 DIAGNOSIS — Z9861 Coronary angioplasty status: Secondary | ICD-10-CM

## 2013-10-10 DIAGNOSIS — R570 Cardiogenic shock: Secondary | ICD-10-CM | POA: Diagnosis not present

## 2013-10-10 DIAGNOSIS — I2510 Atherosclerotic heart disease of native coronary artery without angina pectoris: Secondary | ICD-10-CM

## 2013-10-10 DIAGNOSIS — E872 Acidosis: Secondary | ICD-10-CM

## 2013-10-10 DIAGNOSIS — Z4682 Encounter for fitting and adjustment of non-vascular catheter: Secondary | ICD-10-CM | POA: Diagnosis not present

## 2013-10-10 DIAGNOSIS — I219 Acute myocardial infarction, unspecified: Secondary | ICD-10-CM

## 2013-10-10 DIAGNOSIS — I2542 Coronary artery dissection: Secondary | ICD-10-CM | POA: Diagnosis not present

## 2013-10-10 DIAGNOSIS — R935 Abnormal findings on diagnostic imaging of other abdominal regions, including retroperitoneum: Secondary | ICD-10-CM | POA: Diagnosis not present

## 2013-10-10 DIAGNOSIS — J9600 Acute respiratory failure, unspecified whether with hypoxia or hypercapnia: Secondary | ICD-10-CM | POA: Diagnosis not present

## 2013-10-10 DIAGNOSIS — I517 Cardiomegaly: Secondary | ICD-10-CM

## 2013-10-10 DIAGNOSIS — R918 Other nonspecific abnormal finding of lung field: Secondary | ICD-10-CM | POA: Diagnosis not present

## 2013-10-10 DIAGNOSIS — Z955 Presence of coronary angioplasty implant and graft: Secondary | ICD-10-CM

## 2013-10-10 DIAGNOSIS — I2109 ST elevation (STEMI) myocardial infarction involving other coronary artery of anterior wall: Secondary | ICD-10-CM

## 2013-10-10 HISTORY — DX: Acute respiratory failure, unspecified whether with hypoxia or hypercapnia: J96.00

## 2013-10-10 LAB — LIPID PANEL
Cholesterol: 173 mg/dL (ref 0–200)
HDL: 46 mg/dL (ref >39)
Total CHOL/HDL Ratio: 3.8 RATIO
Triglycerides: 73 mg/dL (ref <150)

## 2013-10-10 LAB — POCT I-STAT 3, ART BLOOD GAS (G3+)
Acid-base deficit: 9 mmol/L — ABNORMAL HIGH (ref 0.0–2.0)
Bicarbonate: 19.4 mEq/L — ABNORMAL LOW (ref 20.0–24.0)
O2 Saturation: 95 %
O2 Saturation: 99 %
Patient temperature: 94
TCO2: 19 mmol/L (ref 0–100)
pCO2 arterial: 44.2 mmHg (ref 35.0–45.0)
pH, Arterial: 7.236 — ABNORMAL LOW (ref 7.350–7.450)
pO2, Arterial: 127 mmHg — ABNORMAL HIGH (ref 80.0–100.0)

## 2013-10-10 LAB — GLUCOSE, CAPILLARY
Glucose-Capillary: 297 mg/dL — ABNORMAL HIGH (ref 70–99)
Glucose-Capillary: 307 mg/dL — ABNORMAL HIGH (ref 70–99)
Glucose-Capillary: 127 mg/dL — ABNORMAL HIGH (ref 70–99)

## 2013-10-10 LAB — BLOOD GAS, ARTERIAL
Acid-base deficit: 6.4 mmol/L — ABNORMAL HIGH (ref 0.0–2.0)
Drawn by: 39866
FIO2: 0.7 %
O2 Saturation: 99.1 %
PEEP: 5 cmH2O
Patient temperature: 95.4
RATE: 24 resp/min
pCO2 arterial: 28.1 mmHg — ABNORMAL LOW (ref 35.0–45.0)
pO2, Arterial: 124 mmHg — ABNORMAL HIGH (ref 80.0–100.0)

## 2013-10-10 LAB — BASIC METABOLIC PANEL
BUN: 22 mg/dL (ref 6–23)
CO2: 20 mEq/L (ref 19–32)
Calcium: 8.7 mg/dL (ref 8.4–10.5)
Creatinine, Ser: 0.7 mg/dL (ref 0.50–1.10)
Glucose, Bld: 345 mg/dL — ABNORMAL HIGH (ref 70–99)
Sodium: 137 mEq/L (ref 135–145)

## 2013-10-10 LAB — URINALYSIS, ROUTINE W REFLEX MICROSCOPIC
Ketones, ur: 15 mg/dL — AB
Leukocytes, UA: NEGATIVE
Nitrite: NEGATIVE
Protein, ur: NEGATIVE mg/dL
pH: 5 (ref 5.0–8.0)

## 2013-10-10 LAB — CBC
HCT: 40.2 % (ref 36.0–46.0)
Hemoglobin: 13.4 g/dL (ref 12.0–15.0)
Hemoglobin: 12.3 g/dL (ref 12.0–15.0)
MCH: 31.2 pg (ref 26.0–34.0)
MCH: 30.8 pg (ref 26.0–34.0)
MCHC: 33.3 g/dL (ref 30.0–36.0)
MCV: 93.7 fL (ref 78.0–100.0)
MCV: 92 fL (ref 78.0–100.0)
Platelets: 283 10*3/uL (ref 150–400)
Platelets: 243 10*3/uL (ref 150–400)
RBC: 4.29 MIL/uL (ref 3.87–5.11)
RBC: 4 MIL/uL (ref 3.87–5.11)
WBC: 21.7 10*3/uL — ABNORMAL HIGH (ref 4.0–10.5)

## 2013-10-10 LAB — CK TOTAL AND CKMB (NOT AT ARMC)
CK, MB: >500 ng/mL (ref 0.3–4.0)
CK, MB: 376.4 ng/mL (ref 0.3–4.0)
Relative Index: 15.2 — ABNORMAL HIGH (ref 0.0–2.5)
Relative Index: 12.2 — ABNORMAL HIGH (ref 0.0–2.5)
Total CK: 2469 U/L — ABNORMAL HIGH (ref 7–177)

## 2013-10-10 LAB — TYPE AND SCREEN
ABO/RH(D): O POS
Antibody Screen: NEGATIVE

## 2013-10-10 LAB — TROPONIN I
Troponin I: >20 ng/mL (ref <0.30)
Troponin I: >20 ng/mL (ref <0.30)

## 2013-10-10 LAB — HEMOGLOBIN A1C: Mean Plasma Glucose: 120 mg/dL — ABNORMAL HIGH (ref <117)

## 2013-10-10 LAB — MRSA PCR SCREENING: MRSA by PCR: NEGATIVE

## 2013-10-10 LAB — PLATELET COUNT: Platelets: 275 10*3/uL (ref 150–400)

## 2013-10-10 LAB — ABO/RH: ABO/RH(D): O POS

## 2013-10-10 SURGERY — CORONARY ARTERY BYPASS GRAFTING (CABG)
Anesthesia: General

## 2013-10-10 MED ORDER — FENTANYL BOLUS VIA INFUSION
25.0000 ug | INTRAVENOUS | Status: DC | PRN
  Administered 2013-10-10 (×2): 50 ug via INTRAVENOUS
  Filled 2013-10-10: qty 50

## 2013-10-10 MED ORDER — ACETAMINOPHEN 325 MG PO TABS
650.0000 mg | ORAL_TABLET | ORAL | Status: DC | PRN
  Administered 2013-10-11 – 2013-10-16 (×7): 650 mg via ORAL
  Filled 2013-10-10 (×7): qty 2

## 2013-10-10 MED ORDER — ATROPINE SULFATE 0.1 MG/ML IJ SOLN
INTRAMUSCULAR | Status: AC
  Filled 2013-10-10: qty 10

## 2013-10-10 MED ORDER — DOPAMINE-DEXTROSE 3.2-5 MG/ML-% IV SOLN
INTRAVENOUS | Status: AC
  Filled 2013-10-10: qty 250

## 2013-10-10 MED ORDER — DOPAMINE-DEXTROSE 3.2-5 MG/ML-% IV SOLN: 2.0000 ug/kg/min | INTRAVENOUS | Status: DC

## 2013-10-10 MED ORDER — TIROFIBAN HCL IV 12.5 MG/250 ML
0.0750 ug/kg/min | INTRAVENOUS | Status: AC
  Administered 2013-10-10: 0.075 ug/kg/min via INTRAVENOUS
  Filled 2013-10-10 (×2): qty 250

## 2013-10-10 MED ORDER — SODIUM CHLORIDE 0.9 % IV SOLN
1.0000 mg/h | INTRAVENOUS | Status: AC
  Filled 2013-10-10: qty 10

## 2013-10-10 MED ORDER — SUCCINYLCHOLINE CHLORIDE 20 MG/ML IJ SOLN
INTRAMUSCULAR | Status: AC | PRN
  Administered 2013-10-10: 100 mg via INTRAVENOUS

## 2013-10-10 MED ORDER — NITROGLYCERIN IN D5W 200-5 MCG/ML-% IV SOLN
2.0000 ug/min | INTRAVENOUS | Status: DC
  Filled 2013-10-10: qty 250

## 2013-10-10 MED ORDER — ATORVASTATIN CALCIUM 80 MG PO TABS
80.0000 mg | ORAL_TABLET | Freq: Every day | ORAL | Status: DC
  Administered 2013-10-10 (×2): 80 mg
  Filled 2013-10-10 (×3): qty 1

## 2013-10-10 MED ORDER — DEXMEDETOMIDINE HCL IN NACL 400 MCG/100ML IV SOLN
0.1000 ug/kg/h | INTRAVENOUS | Status: DC
  Filled 2013-10-10: qty 100

## 2013-10-10 MED ORDER — SODIUM CHLORIDE 0.9 % IV SOLN
INTRAVENOUS | Status: DC
  Filled 2013-10-10: qty 1

## 2013-10-10 MED ORDER — BIVALIRUDIN 250 MG IV SOLR
INTRAVENOUS | Status: AC
  Filled 2013-10-10: qty 250

## 2013-10-10 MED ORDER — ROCURONIUM BROMIDE 100 MG/10ML IV SOLN
INTRAVENOUS | Status: AC | PRN
  Administered 2013-10-10: 50 mg via INTRAVENOUS

## 2013-10-10 MED ORDER — SODIUM CHLORIDE 0.9 % IV SOLN
0.0000 ug/h | INTRAVENOUS | Status: DC
  Administered 2013-10-11: 100 ug/h via INTRAVENOUS
  Filled 2013-10-10 (×2): qty 50

## 2013-10-10 MED ORDER — ASPIRIN 81 MG PO CHEW
81.0000 mg | CHEWABLE_TABLET | Freq: Every day | ORAL | Status: DC
  Administered 2013-10-10 – 2013-10-17 (×8): 81 mg via ORAL
  Filled 2013-10-10 (×8): qty 1

## 2013-10-10 MED ORDER — SODIUM CHLORIDE 0.9 % IV SOLN
10.0000 ug/h | INTRAVENOUS | Status: DC
  Filled 2013-10-10: qty 50

## 2013-10-10 MED ORDER — PANTOPRAZOLE SODIUM 40 MG IV SOLR
40.0000 mg | Freq: Every day | INTRAVENOUS | Status: DC
  Administered 2013-10-10 – 2013-10-11 (×2): 40 mg via INTRAVENOUS
  Filled 2013-10-10 (×2): qty 40

## 2013-10-10 MED ORDER — VANCOMYCIN HCL 10 G IV SOLR
1250.0000 mg | INTRAVENOUS | Status: DC
  Filled 2013-10-10: qty 1250

## 2013-10-10 MED ORDER — MAGNESIUM SULFATE 50 % IJ SOLN
40.0000 meq | INTRAMUSCULAR | Status: DC
  Filled 2013-10-10: qty 10

## 2013-10-10 MED ORDER — BIOTENE DRY MOUTH MT LIQD
15.0000 mL | Freq: Four times a day (QID) | OROMUCOSAL | Status: DC
  Administered 2013-10-10: 15 mL via OROMUCOSAL

## 2013-10-10 MED ORDER — POTASSIUM CHLORIDE 2 MEQ/ML IV SOLN
80.0000 meq | INTRAVENOUS | Status: DC
  Filled 2013-10-10: qty 40

## 2013-10-10 MED ORDER — TIROFIBAN HCL IV 12.5 MG/250 ML
0.0750 ug/kg/min | INTRAVENOUS | Status: DC
  Filled 2013-10-10: qty 250

## 2013-10-10 MED ORDER — TIROFIBAN HCL IV 12.5 MG/250 ML
INTRAVENOUS | Status: AC
  Filled 2013-10-10: qty 250

## 2013-10-10 MED ORDER — TICAGRELOR 90 MG PO TABS
90.0000 mg | ORAL_TABLET | Freq: Two times a day (BID) | ORAL | Status: DC
  Administered 2013-10-10 – 2013-10-17 (×15): 90 mg via ORAL
  Filled 2013-10-10 (×17): qty 1

## 2013-10-10 MED ORDER — LEVOTHYROXINE SODIUM 100 MCG PO TABS
100.0000 ug | ORAL_TABLET | Freq: Every day | ORAL | Status: DC
  Administered 2013-10-10 – 2013-10-17 (×8): 100 ug via ORAL
  Filled 2013-10-10 (×10): qty 1

## 2013-10-10 MED ORDER — SODIUM CHLORIDE 0.9 % IV SOLN
INTRAVENOUS | Status: DC
  Filled 2013-10-10: qty 40

## 2013-10-10 MED ORDER — PLASMA-LYTE 148 IV SOLN
INTRAVENOUS | Status: DC
  Filled 2013-10-10: qty 2.5

## 2013-10-10 MED ORDER — SODIUM CHLORIDE 0.9 % IJ SOLN
3.0000 mL | Freq: Two times a day (BID) | INTRAMUSCULAR | Status: DC
  Administered 2013-10-10 – 2013-10-11 (×2): 10 mL via INTRAVENOUS
  Administered 2013-10-12 – 2013-10-16 (×7): 3 mL via INTRAVENOUS

## 2013-10-10 MED ORDER — NOREPINEPHRINE BITARTRATE 1 MG/ML IJ SOLN
2.0000 ug/min | INTRAVENOUS | Status: DC
  Administered 2013-10-10: 15 ug/min via INTRAVENOUS
  Administered 2013-10-11: 4 ug/min via INTRAVENOUS
  Filled 2013-10-10 (×2): qty 16

## 2013-10-10 MED ORDER — LIDOCAINE HCL (PF) 1 % IJ SOLN
INTRAMUSCULAR | Status: AC
  Filled 2013-10-10: qty 30

## 2013-10-10 MED ORDER — ONDANSETRON HCL 4 MG/2ML IJ SOLN: 4.0000 mg | Freq: Four times a day (QID) | INTRAMUSCULAR | Status: DC | PRN

## 2013-10-10 MED ORDER — SODIUM CHLORIDE 0.9 % IV SOLN
INTRAVENOUS | Status: DC
  Filled 2013-10-10: qty 30

## 2013-10-10 MED ORDER — PHENYLEPHRINE HCL 10 MG/ML IJ SOLN
30.0000 ug/min | INTRAVENOUS | Status: DC
  Filled 2013-10-10: qty 2

## 2013-10-10 MED ORDER — SODIUM CHLORIDE 0.9 % IV SOLN
250.0000 mL | INTRAVENOUS | Status: DC | PRN
  Administered 2013-10-10: 250 mL via INTRAVENOUS

## 2013-10-10 MED ORDER — PANTOPRAZOLE SODIUM 40 MG PO TBEC: 40.0000 mg | DELAYED_RELEASE_TABLET | Freq: Every day | ORAL | Status: DC

## 2013-10-10 MED ORDER — SODIUM BICARBONATE 8.4 % IV SOLN: 50.0000 meq | Freq: Once | INTRAVENOUS | Status: DC

## 2013-10-10 MED ORDER — DOPAMINE-DEXTROSE 3.2-5 MG/ML-% IV SOLN
5.0000 ug/kg/min | INTRAVENOUS | Status: DC
  Administered 2013-10-10: 5 ug/kg/min via INTRAVENOUS

## 2013-10-10 MED ORDER — ADENOSINE 6 MG/2ML IV SOLN
INTRAVENOUS | Status: AC
  Filled 2013-10-10: qty 2

## 2013-10-10 MED ORDER — DEXTROSE 5 % IV SOLN
750.0000 mg | INTRAVENOUS | Status: DC
  Filled 2013-10-10: qty 750

## 2013-10-10 MED ORDER — DEXTROSE 5 % IV SOLN
1.5000 g | INTRAVENOUS | Status: DC
  Filled 2013-10-10: qty 1.5

## 2013-10-10 MED ORDER — CHLORHEXIDINE GLUCONATE 0.12 % MT SOLN: 15.0000 mL | Freq: Two times a day (BID) | OROMUCOSAL | Status: DC

## 2013-10-10 MED ORDER — MIDAZOLAM HCL 2 MG/2ML IJ SOLN
INTRAMUSCULAR | Status: AC
  Filled 2013-10-10: qty 2

## 2013-10-10 MED ORDER — EPINEPHRINE HCL 1 MG/ML IJ SOLN
0.5000 ug/min | INTRAVENOUS | Status: DC
  Filled 2013-10-10: qty 4

## 2013-10-10 MED ORDER — BIOTENE DRY MOUTH MT LIQD
15.0000 mL | Freq: Four times a day (QID) | OROMUCOSAL | Status: DC
  Administered 2013-10-10 – 2013-10-11 (×5): 15 mL via OROMUCOSAL

## 2013-10-10 MED ORDER — VERAPAMIL HCL 2.5 MG/ML IV SOLN
INTRAVENOUS | Status: AC
  Filled 2013-10-10: qty 2

## 2013-10-10 MED ORDER — SODIUM CHLORIDE 0.9 % IJ SOLN
3.0000 mL | INTRAMUSCULAR | Status: DC | PRN
  Administered 2013-10-15: 3 mL via INTRAVENOUS

## 2013-10-10 MED ORDER — HEPARIN (PORCINE) IN NACL 2-0.9 UNIT/ML-% IJ SOLN
INTRAMUSCULAR | Status: AC
  Filled 2013-10-10: qty 1000

## 2013-10-10 MED ORDER — ETOMIDATE 2 MG/ML IV SOLN
INTRAVENOUS | Status: AC | PRN
  Administered 2013-10-10: 14 mg via INTRAVENOUS

## 2013-10-10 MED ORDER — ONDANSETRON HCL 4 MG/2ML IJ SOLN
INTRAMUSCULAR | Status: AC
  Filled 2013-10-10: qty 2

## 2013-10-10 MED ORDER — HEPARIN (PORCINE) IN NACL 2-0.9 UNIT/ML-% IJ SOLN
INTRAMUSCULAR | Status: AC
  Filled 2013-10-10: qty 500

## 2013-10-10 MED ORDER — SODIUM CHLORIDE 0.9 % IV SOLN
1.0000 mL/kg/h | INTRAVENOUS | Status: AC
  Administered 2013-10-10: 1 mL/kg/h via INTRAVENOUS

## 2013-10-10 MED ORDER — NITROGLYCERIN 0.2 MG/ML ON CALL CATH LAB
INTRAVENOUS | Status: AC
  Filled 2013-10-10: qty 1

## 2013-10-10 MED ORDER — SODIUM CHLORIDE 0.9 % IV SOLN
INTRAVENOUS | Status: DC | PRN
  Administered 2013-10-10: 05:00:00 via INTRAVENOUS

## 2013-10-10 MED ORDER — ONDANSETRON HCL 4 MG/2ML IJ SOLN
4.0000 mg | Freq: Four times a day (QID) | INTRAMUSCULAR | Status: DC | PRN
  Administered 2013-10-13: 4 mg via INTRAVENOUS
  Filled 2013-10-10: qty 2

## 2013-10-10 MED ORDER — CEFAZOLIN SODIUM-DEXTROSE 2-3 GM-% IV SOLR
2.0000 g | Freq: Once | INTRAVENOUS | Status: AC
  Administered 2013-10-10: 2 g via INTRAVENOUS
  Filled 2013-10-10: qty 50

## 2013-10-10 MED ORDER — INSULIN ASPART 100 UNIT/ML ~~LOC~~ SOLN
0.0000 [IU] | SUBCUTANEOUS | Status: DC
  Administered 2013-10-10: 8 [IU] via SUBCUTANEOUS
  Administered 2013-10-10 (×2): 2 [IU] via SUBCUTANEOUS
  Administered 2013-10-10: 11 [IU] via SUBCUTANEOUS
  Administered 2013-10-10: 3 [IU] via SUBCUTANEOUS
  Administered 2013-10-10 – 2013-10-11 (×3): 2 [IU] via SUBCUTANEOUS

## 2013-10-10 MED ORDER — TICAGRELOR 90 MG PO TABS
180.0000 mg | ORAL_TABLET | Freq: Once | ORAL | Status: AC
  Administered 2013-10-10: 180 mg
  Filled 2013-10-10: qty 2

## 2013-10-10 MED ORDER — CHLORHEXIDINE GLUCONATE 0.12 % MT SOLN
15.0000 mL | Freq: Two times a day (BID) | OROMUCOSAL | Status: DC
  Administered 2013-10-10 – 2013-10-11 (×3): 15 mL via OROMUCOSAL
  Filled 2013-10-10 (×3): qty 15

## 2013-10-10 SURGICAL SUPPLY — 82 items
ATTRACTOMAT 16X20 MAGNETIC DRP (DRAPES) ×3 IMPLANT
BAG DECANTER FOR FLEXI CONT (MISCELLANEOUS) ×2 IMPLANT
BANDAGE ELASTIC 4 VELCRO ST LF (GAUZE/BANDAGES/DRESSINGS) ×3 IMPLANT
BANDAGE ELASTIC 6 VELCRO ST LF (GAUZE/BANDAGES/DRESSINGS) ×3 IMPLANT
BANDAGE GAUZE ELAST BULKY 4 IN (GAUZE/BANDAGES/DRESSINGS) ×3 IMPLANT
BASKET HEART (ORDER IN 25'S) (MISCELLANEOUS) ×1
BASKET HEART (ORDER IN 25S) (MISCELLANEOUS) ×2 IMPLANT
BLADE STERNUM SYSTEM 6 (BLADE) ×3 IMPLANT
CANISTER SUCTION 2500CC (MISCELLANEOUS) ×2 IMPLANT
CANNULA EZ GLIDE AORTIC 21FR (CANNULA) ×3 IMPLANT
CANNULA VENOUS LOW PROF 34X46 (CANNULA) IMPLANT
CATH CPB KIT HENDRICKSON (MISCELLANEOUS) ×3 IMPLANT
CATH ROBINSON RED A/P 18FR (CATHETERS) ×3 IMPLANT
CATH THORACIC 36FR (CATHETERS) ×3 IMPLANT
CATH THORACIC 36FR RT ANG (CATHETERS) ×3 IMPLANT
CLIP TI MEDIUM 24 (CLIP) IMPLANT
CLIP TI WIDE RED SMALL 24 (CLIP) IMPLANT
COVER SURGICAL LIGHT HANDLE (MISCELLANEOUS) ×3 IMPLANT
COVER TABLE BACK 60X90 (DRAPES) IMPLANT
CRADLE DONUT ADULT HEAD (MISCELLANEOUS) ×3 IMPLANT
DRAPE CARDIOVASCULAR INCISE (DRAPES) ×2
DRAPE SLUSH/WARMER DISC (DRAPES) ×3 IMPLANT
DRAPE SRG 135X102X78XABS (DRAPES) ×2 IMPLANT
DRSG COVADERM 4X14 (GAUZE/BANDAGES/DRESSINGS) ×3 IMPLANT
ELECT REM PT RETURN 9FT ADLT (ELECTROSURGICAL) ×4
ELECTRODE REM PT RTRN 9FT ADLT (ELECTROSURGICAL) ×4 IMPLANT
GLOVE BIOGEL PI IND STRL 6 (GLOVE) ×1 IMPLANT
GLOVE BIOGEL PI IND STRL 6.5 (GLOVE) ×1 IMPLANT
GLOVE BIOGEL PI INDICATOR 6 (GLOVE) ×1
GLOVE BIOGEL PI INDICATOR 6.5 (GLOVE) ×1
GLOVE EUDERMIC 7 POWDERFREE (GLOVE) ×9 IMPLANT
GOWN PREVENTION PLUS XLARGE (GOWN DISPOSABLE) ×6 IMPLANT
GOWN STRL NON-REIN LRG LVL3 (GOWN DISPOSABLE) ×12 IMPLANT
HEMOSTAT POWDER SURGIFOAM 1G (HEMOSTASIS) ×6 IMPLANT
HEMOSTAT SURGICEL 2X14 (HEMOSTASIS) ×3 IMPLANT
INSERT FOGARTY XLG (MISCELLANEOUS) IMPLANT
KIT BASIN OR (CUSTOM PROCEDURE TRAY) ×3 IMPLANT
KIT ROOM TURNOVER OR (KITS) ×3 IMPLANT
KIT SUCTION CATH 14FR (SUCTIONS) ×6 IMPLANT
KIT VASOVIEW W/TROCAR VH 2000 (KITS) ×2 IMPLANT
MARKER GRAFT CORONARY BYPASS (MISCELLANEOUS) ×6 IMPLANT
NS IRRIG 1000ML POUR BTL (IV SOLUTION) ×15 IMPLANT
PACK OPEN HEART (CUSTOM PROCEDURE TRAY) ×3 IMPLANT
PAD ARMBOARD 7.5X6 YLW CONV (MISCELLANEOUS) ×6 IMPLANT
PAD ELECT DEFIB RADIOL ZOLL (MISCELLANEOUS) ×3 IMPLANT
PENCIL BUTTON HOLSTER BLD 10FT (ELECTRODE) ×3 IMPLANT
PUNCH AORTIC ROTATE 4.0MM (MISCELLANEOUS) IMPLANT
PUNCH AORTIC ROTATE 4.5MM 8IN (MISCELLANEOUS) IMPLANT
PUNCH AORTIC ROTATE 5MM 8IN (MISCELLANEOUS) IMPLANT
SPONGE GAUZE 4X4 12PLY (GAUZE/BANDAGES/DRESSINGS) ×6 IMPLANT
SUT BONE WAX W31G (SUTURE) ×3 IMPLANT
SUT MNCRL AB 4-0 PS2 18 (SUTURE) IMPLANT
SUT PROLENE 3 0 SH DA (SUTURE) ×3 IMPLANT
SUT PROLENE 4 0 RB 1 (SUTURE)
SUT PROLENE 4 0 SH DA (SUTURE) IMPLANT
SUT PROLENE 4-0 RB1 .5 CRCL 36 (SUTURE) IMPLANT
SUT PROLENE 6 0 C 1 30 (SUTURE) ×6 IMPLANT
SUT PROLENE 7 0 BV1 MDA (SUTURE) ×3 IMPLANT
SUT PROLENE 8 0 BV175 6 (SUTURE) IMPLANT
SUT SILK  1 MH (SUTURE)
SUT SILK 1 MH (SUTURE) IMPLANT
SUT STEEL 6MS V (SUTURE) IMPLANT
SUT STEEL STERNAL CCS#1 18IN (SUTURE) IMPLANT
SUT STEEL SZ 6 DBL 3X14 BALL (SUTURE) IMPLANT
SUT VIC AB 1 CTX 36 (SUTURE) ×4
SUT VIC AB 1 CTX36XBRD ANBCTR (SUTURE) ×4 IMPLANT
SUT VIC AB 2-0 CT1 27 (SUTURE)
SUT VIC AB 2-0 CT1 TAPERPNT 27 (SUTURE) IMPLANT
SUT VIC AB 2-0 CTX 27 (SUTURE) IMPLANT
SUT VIC AB 3-0 SH 27 (SUTURE)
SUT VIC AB 3-0 SH 27X BRD (SUTURE) IMPLANT
SUT VIC AB 3-0 X1 27 (SUTURE) IMPLANT
SUT VICRYL 4-0 PS2 18IN ABS (SUTURE) IMPLANT
SUTURE E-PAK OPEN HEART (SUTURE) ×3 IMPLANT
SYSTEM SAHARA CHEST DRAIN ATS (WOUND CARE) ×3 IMPLANT
TOWEL OR 17X24 6PK STRL BLUE (TOWEL DISPOSABLE) ×6 IMPLANT
TOWEL OR 17X26 10 PK STRL BLUE (TOWEL DISPOSABLE) ×6 IMPLANT
TRAY FOLEY IC TEMP SENS 14FR (CATHETERS) ×3 IMPLANT
TUBE FEEDING 8FR 16IN STR KANG (MISCELLANEOUS) ×3 IMPLANT
TUBING INSUFFLATION 10FT LAP (TUBING) ×3 IMPLANT
UNDERPAD 30X30 INCONTINENT (UNDERPADS AND DIAPERS) ×3 IMPLANT
WATER STERILE IRR 1000ML POUR (IV SOLUTION) ×6 IMPLANT

## 2013-10-10 NOTE — Progress Notes (Signed)
Subjective: Intubated, writing notes, alert and oriented.  Objective: Vital signs in last 24 hours: Temp:  [95.4 F (35.2 C)-98.1 F (36.7 C)] 97.6 F (36.4 C) (12/23 0721) Pulse Rate:  [51-131] 67 (12/23 0800) Resp:  [12-25] 25 (12/23 0800) BP: (77-182)/(22-110) 94/57 mmHg (12/23 0800) SpO2:  [91 %-100 %] 100 % (12/23 0800) Arterial Line BP: (100-155)/(45-68) 100/45 mmHg (12/23 0800) FiO2 (%):  [50 %-100 %] 50 % (12/23 0652) Weight:  [210 lb (95.255 kg)-220 lb 7.4 oz (100 kg)] 220 lb 7.4 oz (100 kg) (12/23 0345) Weight change:  Last BM Date: 10/09/13 Intake/Output from previous day: +85 12/22 0701 - 12/23 0700 In: 657.9 [I.V.:467.9; NG/GT:140; IV Piggyback:50] Out: 450 [Urine:450] Intake/Output this shift: Total I/O In: 121 [I.V.:121] Out: 150 [Urine:150]  PE: General:Pleasant affect, NAD, though anxious, ET tube bothers her.  Skin:Warm and dry, brisk capillary refill, bear hugger in place HEENT:normocephalic, sclera clear, mucus membranes moist Heart:S1S2 RRR without murmur, gallup, rub or click Lungs: without rales, + rhonchi rt base, no wheezes RUE:AVWU, non tender, + BS, do not palpate liver spleen or masses Ext:no lower ext edema, 2+ pedal pulses rt groin with ecchymosis and line in place Neuro:alert and oriented, MAE, follows commands, + facial symmetry   Lab Results:  Recent Labs  10/09/13 2220 10/10/13 0430 10/10/13 0742  WBC 11.6* 16.8*  --   HGB 14.9 13.4  --   HCT 44.9 40.2  --   PLT 243 283 275   BMET  Recent Labs  10/09/13 2220 10/10/13 0430  NA 136 137  K 3.8 4.0  CL 102 104  CO2 22 20  GLUCOSE 147* 345*  BUN 26* 22  CREATININE 0.87 0.70  CALCIUM 10.3 8.7    Recent Labs  10/10/13 0430  TROPONINI >20.00*    Lab Results  Component Value Date   CHOL 173 10/10/2013   HDL 46 10/10/2013   LDLCALC 112* 10/10/2013   TRIG 73 10/10/2013   CHOLHDL 3.8 10/10/2013   No results found for this basename: HGBA1C     No results found  for this basename: TSH    Hepatic Function Panel No results found for this basename: PROT, ALBUMIN, AST, ALT, ALKPHOS, BILITOT, BILIDIR, IBILI,  in the last 72 hours  Recent Labs  10/10/13 0430  CHOL 173   No results found for this basename: PROTIME,  in the last 72 hours  Studies/Results: Imaging studies reviewed.  Cardiac cath: 100% thrombotic occluded mid LAD associated dissection. It is unclear whether this was a spontaneous dissection versus related to the initial predilation balloon angioplasty. There is presence of intramural thrombus at the initial dissection site.  Successful complex PCI of a long segment of LAD using 2 overlapping Promus Premier DES stents as described incorporating balloon angioplasty, aspiration thrombectomy and intracoronary/IVUS with restoration of TIMI 2 flow after initial no reflow phenomenon.  Suspect that the patient's prolonged chest pain may very well have indicated prolonged LAD occlusion and therefore myocardial stunning leading to no reflow phenomenon after balloon angioplasty, dissection and intramural hematoma Moderate proximal LAD disease and moderate to significant proximal RCA disease in a large dominant RCA  Likely large anterior infarct, will confirm with echocardiogram, associated cardiogenic shock requiring vasopressor therapy  Hypoxic respiratory failure, likely secondary to sedation plus or minus Pulmonary edema requiring emergent intubation by anesthesiology  Moderate sized right femoral hematoma and small to moderate right radial hematoma, both appear stable upon transfer to CCU.  Mixed respiratory and metabolic acidosis  Medications: I have reviewed the patient's current medications. Scheduled Meds: . antiseptic oral rinse  15 mL Mouth Rinse QID  . aspirin  81 mg Oral Daily  . atorvastatin  80 mg Per Tube q1800  . chlorhexidine  15 mL Mouth Rinse BID  . insulin aspart  0-15 Units Subcutaneous Q4H  . levothyroxine  100 mcg Oral QAC  breakfast  . midazolam (VERSED) infusion  1 mg/hr Intravenous STAT  . ondansetron      . pantoprazole (PROTONIX) IV  40 mg Intravenous Daily  . sodium bicarbonate  50 mEq Intravenous Once  . sodium chloride  3 mL Intravenous Q12H  . Ticagrelor  90 mg Oral BID   Continuous Infusions: . sodium chloride 0.525 mL/kg/hr (10/10/13 0500)  . DOPamine Stopped (10/10/13 0801)  . fentaNYL infusion INTRAVENOUS 50 mcg/hr (10/10/13 0345)  . norepinephrine (LEVOPHED) Adult infusion 8 mcg/min (10/10/13 0630)  . tirofiban 0.075 mcg/kg/min (10/10/13 0416)   PRN Meds:.sodium chloride, sodium chloride, acetaminophen, fentaNYL, ondansetron (ZOFRAN) IV, sodium chloride  Assessment/Plan: Principal Problem:   STEMI (ST elevation myocardial infarction) Active Problems:   Cardiogenic shock   Presence of drug coated stent in LAD coronary artery - 2 overlapping Promx DES 2.5 x 38, 2.5 x 12 (postdilated to ~3 mm)   Metabolic acidosis   Acute respiratory failure due to hypoxia  PLAN: Pt off Dopamine, improving.  Troponin >20, intubated, Multifocal ventricular tach.  Mg+ pending- reperfusion.  CT neg for disection. CXR with poss pul edema. Pulmonary following vent.     LOS: 1 day   Time spent with pt. :30 minutes. [facility] R  Nurse Practitioner Certified Pager [phone] or after 5pm and on weekends call [phone] 10/10/2013, 8:28 AM  I seen and evaluated the patient this morning along with Nada Boozer, NP. She continues to become more stable with ABGs improved, requiring less oxygen. She is now awake and writing notes. She is asking to be extubated. Her blood pressures are relatively stable on ever decreasing dose of Levophed. Dopamine was weaned off. No sign of adverse contrast reaction. Urine output continues to be brisk. Continue Aggrastat for total of 18 hours, but loading dose of Brilinta was administered.  For now would keep the femoral lines sutured in place as long as she is on pressors and  intubated. Preferably would remove that and wants Aggrastat is complete. Hematoma is dramatically improved.  She denies any active chest discomfort. Bedside review of echocardiogram reveals as expected severe distal anterior anteroapical hypokinesis to akinesis. Will do formal read, would anticipate EF to be very low. Apparently the inferior wall does appear to be hyperdynamic and compensatory. As expected CK and troponin levels are very elevated.  At present, she remains hemodynamically stable, ECG is improving. [facility] M. is following vent, would anticipate allowing her to rest on the vent today. Left-sided airspace opacification, possible pulmonary edema. If necessary could likely initiate some diuresis as blood pressure stabilizes. Statin ordered.  Marykay Lex, M.D., M.S. [facility] [address] [place], [place]  [address]  [phone] Pager # [phone] 10/10/2013 11:55 AM

## 2013-10-10 NOTE — Progress Notes (Signed)
CRITICAL VALUE ALERT  Critical value received:  Trop >20  Date of notification:  10/10/2013  Time of notification:  0543  Critical value read back:yes  Nurse who received alert:  Sunday Corn Cartner   Pt post cath, with previously charted positive trop. Expected value.

## 2013-10-10 NOTE — Progress Notes (Signed)
Pt placed on CP/PS per Dr. Sung Amabile. OK for pt to remain on CP/PS overnight and plan to extubate in the morning. No complications noted. RT will monitor.

## 2013-10-10 NOTE — Anesthesia Procedure Notes (Signed)
Procedure Name: Intubation Performed by: Hart Robinsons Pre-anesthesia Checklist: Patient identified, Emergency Drugs available, Suction available, Patient being monitored and Timeout performed Patient Re-evaluated:Patient Re-evaluated prior to inductionOxygen Delivery Method: Ambu bag Preoxygenation: Pre-oxygenation with 100% oxygen Intubation Type: IV induction and Rapid sequence Grade View: Grade I Tube type: Oral Tube size: 7.5 mm Number of attempts: 1 Airway Equipment and Method: Video-laryngoscopy and Stylet Placement Confirmation: ETT inserted through vocal cords under direct vision,  positive ETCO2,  CO2 detector and breath sounds checked- equal and bilateral Secured at: 21 cm Tube secured with: Tape Dental Injury: Teeth and Oropharynx as per pre-operative assessment  Comments: Induced and intubated in cath lab emergently. CF

## 2013-10-10 NOTE — Anesthesia Preprocedure Evaluation (Signed)
Anesthesia Evaluation  Patient identified by MRN, date of birth, ID band Patient awake    Reviewed: Allergy & Precautions, H&P , NPO status , Patient's Chart, lab work & pertinent test results, reviewed documented beta blocker date and time , Unable to perform ROS - Chart review only  Airway Mallampati: III TM Distance: >3 FB and <3 FB Neck ROM: full    Dental   Pulmonary shortness of breath, at rest and lying, former smoker,  + rhonchi     rales    Cardiovascular hypertension, On Medications and On Home Beta Blockers + Past MI and + Cardiac Stents Rhythm:regular     Neuro/Psych  Headaches, PSYCHIATRIC DISORDERS    GI/Hepatic negative GI ROS, Neg liver ROS,   Endo/Other  negative endocrine ROS  Renal/GU negative Renal ROS  negative genitourinary   Musculoskeletal   Abdominal   Peds  Hematology negative hematology ROS (+)   Anesthesia Other Findings See surgeon's H&P   Reproductive/Obstetrics negative OB ROS                           Anesthesia Physical Anesthesia Plan  ASA: IV and emergent  Anesthesia Plan: General   Post-op Pain Management:    Induction: Intravenous, Rapid sequence and Cricoid pressure planned  Airway Management Planned: Oral ETT  Additional Equipment: Arterial line, CVP, PA Cath, TEE and Ultrasound Guidance Line Placement  Intra-op Plan:   Post-operative Plan: Post-operative intubation/ventilation  Informed Consent: I have reviewed the patients History and Physical, chart, labs and discussed the procedure including the risks, benefits and alternatives for the proposed anesthesia with the patient or authorized representative who has indicated his/her understanding and acceptance.   History available from chart only and Only emergency history available  Plan Discussed with: CRNA and Surgeon  Anesthesia Plan Comments:         Anesthesia Quick Evaluation

## 2013-10-10 NOTE — Progress Notes (Signed)
INITIAL NUTRITION ASSESSMENT  DOCUMENTATION CODES Per approved criteria  -Obesity Unspecified   INTERVENTION: 1.  Enteral nutrition; if pt to remain intubated and TF warranted, initiate Vital HP @ 20 mL/hr continuous.  Advance by 10 mL q 4 hrs to 60 mL/hr goal to provide 1440 kcal, 126g protein, 1195 mL free water.  NUTRITION DIAGNOSIS: Inadequate oral intake related to inability to eat as evidenced by pt intubated, NPO.   Monitor:  1.  Enteral nutrition; initiation with tolerance.  Enteral nutrition to provide 60-70% of estimated calorie needs (22-25 kcals/kg ideal body weight) and 100% of estimated protein needs, based on ASPEN guidelines for permissive underfeeding in critically ill obese individuals. 2.  Wt/wt change; monitor trends  Reason for Assessment: vent  79 y.o. female  Admitting Dx: STEMI (ST elevation myocardial infarction)  ASSESSMENT: Pt admitted with back pain; found to have STEMI.  Now s/p cath and remains intubated due to cardiogenic shock.  Patient is currently intubated on ventilator support.  MV: 13.0 L/min Temp (24hrs), Avg:97.5 F (36.4 C), Min:95.4 F (35.2 C), Max:98.8 F (37.1 C)  Propofol: none  Pt is getting a bath during visit.  No family at bedside.  Nutrition Focused Physical Exam: Subcutaneous Fat:  Orbital Region: not observed Upper Arm Region: WNL Thoracic and Lumbar Region: WNL Muscle:  Temple Region: not observed Clavicle Bone Region: WNL Clavicle and Acromion Bone Region: WNL Scapular Bone Region: WNL Dorsal Hand: WNL Patellar Region: WNL Anterior Thigh Region: WNL Posterior Calf Region: WNL  Edema: none present  Height: Ht Readings from Last 1 Encounters:  10/09/13 5\' 6"  (1.676 m)    Weight: Wt Readings from Last 1 Encounters:  10/10/13 220 lb 7.4 oz (100 kg)    Ideal Body Weight: 59.1  % Ideal Body Weight: 169%  Wt Readings from Last 10 Encounters:  10/10/13 220 lb 7.4 oz (100 kg)  10/10/13 220 lb 7.4 oz  (100 kg)  10/10/13 220 lb 7.4 oz (100 kg)    Usual Body Weight: unknown  % Usual Body Weight: unable to assess  BMI:  Body mass index is 35.6 kg/(m^2).  Estimated Nutritional Needs: Kcal: 1911 Permissive underfeeding goals: 1300-1475 Protein: >/=118g Fluid: >1.5 L/day  Skin: intact  Diet Order: NPO  EDUCATION NEEDS: -Education not appropriate at this time   Intake/Output Summary (Last 24 hours) at 10/10/13 1401 Last data filed at 10/10/13 1300  Gross per 24 hour  Intake 1262.6 ml  Output   1000 ml  Net  262.6 ml    Last BM: 12/22  Labs:   Recent Labs Lab 10/09/13 2220 10/10/13 0430  NA 136 137  K 3.8 4.0  CL 102 104  CO2 22 20  BUN 26* 22  CREATININE 0.87 0.70  CALCIUM 10.3 8.7  MG  --  1.7  GLUCOSE 147* 345*    CBG (last 3)   Recent Labs  10/10/13 0449 10/10/13 0724  GLUCAP 297* 307*    Scheduled Meds: . antiseptic oral rinse  15 mL Mouth Rinse QID  . aspirin  81 mg Oral Daily  . atorvastatin  80 mg Per Tube q1800  . chlorhexidine  15 mL Mouth Rinse BID  . insulin aspart  0-15 Units Subcutaneous Q4H  . levothyroxine  100 mcg Oral QAC breakfast  . midazolam (VERSED) infusion  1 mg/hr Intravenous STAT  . pantoprazole (PROTONIX) IV  40 mg Intravenous Daily  . sodium bicarbonate  50 mEq Intravenous Once  . sodium chloride  3 mL  Intravenous Q12H  . Ticagrelor  90 mg Oral BID    Continuous Infusions: . DOPamine Stopped (10/10/13 0801)  . fentaNYL infusion INTRAVENOUS 50 mcg/hr (10/10/13 0345)  . norepinephrine (LEVOPHED) Adult infusion 10 mcg/min (10/10/13 0945)  . tirofiban 0.075 mcg/kg/min (10/10/13 0416)    Past Medical History  Diagnosis Date  . Hypertension   . Migraine headache   . Thyroid disease   . Depression   . Acute respiratory failure due to hypoxia 10/10/2013    Past Surgical History  Procedure Laterality Date  . Shoulder surgery      Loyce Dys, MS RD LDN Clinical Inpatient Dietitian Pager:  [phone] Weekend/After hours pager: [phone]

## 2013-10-10 NOTE — Progress Notes (Addendum)
During the course of the patient's cardiac catheterization PCI procedure, the patient became hypotensive and hypoxic requiring intubation and initiation of intravenous vasopressors. Central venous access and arterial line access was obtained.  And then was managing the patient's ventilator and pressors during the completion of the procedure. Post procedure I was present at the patient's bedside for at least an hour monitoring blood pressure and heart rate changes as the patient had several episodes of vagal mediated bradycardia as well as AIVR mediated hypotension.  She was also noted to be acidotic, most likely metabolic but some respiratory component. Intravenous bicarbonate was administered, and intravenous vasopressors were adjusted including titration Levophed and dopamine infusions.  The patient is critically ill with cardiac shock after anterior ST elevation MI Complicated by hypoxic respiratory failure requiring intubation. This requires extensive medical decision-making and coordination with some services including [facility].  Total critical care time for PCI and post PCI: 90 minutes  Jodiann Ognibene W, M.D., M.S. [facility] [address] [place], [place]  [address]  [phone] Pager # [phone] 10/10/2013 5:41 AM

## 2013-10-10 NOTE — Consult Note (Signed)
Reason for Consult: STEMI due to LAD occlusion Referring Physician: Dr. Joretta Bachelor Melinda Noble is an 79 y.o. female.  HPI: Melinda Noble is an 79 yo woman who presented with a cc/o severe back pain.  Melinda Noble presented to the [facility] ED ~ 10 PM 12/22 with a c/o severe pain between her shoulder blades. She had been having the pain most of the day. On arrival ECG showed diffuse J point elevation. No clear STEMI at that point. Troponin was 1.2. She was noted to be severely hypertensive.  She had a CT of the chest to r/o aortic dissection. After the Ct a repeat EKG showed clear ST elevation anteriorly. She was transported to [facility] cath lab emergently as a code STEMI.    Past Medical History  Diagnosis Date  . Hypertension   . Migraine headache   . Thyroid disease   . Depression   . Acute respiratory failure due to hypoxia 10/10/2013    Past Surgical History  Procedure Laterality Date  . Shoulder surgery      No family history on file.  Social History:  reports that she has quit smoking. She does not have any smokeless tobacco history on file. She reports that she does not drink alcohol or use illicit drugs.  Allergies:  Allergies  Allergen Reactions  . Iohexol      Code: HIVES, Desc: anaphylactic shock s/p contrast injection many yrs ago--suggested that pt NEVER have iv contrast//a.c., Onset Date: [id number]   . Penicillins Nausea Only  . Sulfa Antibiotics Rash    Medications:  Prior to Admission:  Prescriptions prior to admission  Medication Sig Dispense Refill  . acetaminophen (TYLENOL) 500 MG tablet Take 500-1,000 mg by mouth every 6 (six) hours as needed for mild pain.      Marland Kitchen atenolol (TENORMIN) 25 MG tablet Take 12.5 mg by mouth 2 (two) times daily.      . Biotin 1000 MCG tablet Take 1,000 mcg by mouth daily.      . calcium-vitamin D (OSCAL WITH D) 500-200 MG-UNIT per tablet Take 2 tablets by mouth daily.      . cholecalciferol (VITAMIN D) 1000 UNITS tablet Take 1,000 Units by  mouth daily.      . clobetasol cream (TEMOVATE) 0.05 % Apply 1 application topically 2 (two) times daily.      . clorazepate (TRANXENE) 3.75 MG tablet Take 0.9375 mg by mouth 2 (two) times daily as needed for anxiety.      . famotidine (PEPCID) 10 MG tablet Take 10 mg by mouth 2 (two) times daily as needed for heartburn or indigestion.      . fexofenadine (ALLEGRA) 60 MG tablet Take 60 mg by mouth 2 (two) times daily as needed for allergies or rhinitis.      Marland Kitchen levothyroxine (SYNTHROID, LEVOTHROID) 100 MCG tablet Take 100 mcg by mouth daily before breakfast.      . Multiple Vitamins-Minerals (MULTIVITAMIN WITH MINERALS) tablet Take 1 tablet by mouth daily.      . sertraline (ZOLOFT) 50 MG tablet Take 50 mg by mouth daily.      . vitamin E (VITAMIN E) 400 UNIT capsule Take 400 Units by mouth daily.        Results for orders placed during the hospital encounter of 10/09/13 (from the past 48 hour(s))  CBC     Status: Abnormal   Collection Time    10/09/13 10:20 PM      Result Value Range  WBC 11.6 (*) 4.0 - 10.5 K/uL   RBC 4.85  3.87 - 5.11 MIL/uL   Hemoglobin 14.9  12.0 - 15.0 g/dL   HCT 16.1  09.6 - 04.5 %   MCV 92.6  78.0 - 100.0 fL   MCH 30.7  26.0 - 34.0 pg   MCHC 33.2  30.0 - 36.0 g/dL   RDW 40.9  81.1 - 91.4 %   Platelets 243  150 - 400 K/uL  BASIC METABOLIC PANEL     Status: Abnormal   Collection Time    10/09/13 10:20 PM      Result Value Range   Sodium 136  135 - 145 mEq/L   Potassium 3.8  3.5 - 5.1 mEq/L   Chloride 102  96 - 112 mEq/L   CO2 22  19 - 32 mEq/L   Glucose, Bld 147 (*) 70 - 99 mg/dL   BUN 26 (*) 6 - 23 mg/dL   Creatinine, Ser 7.82  0.50 - 1.10 mg/dL   Calcium 95.6  8.4 - 21.3 mg/dL   GFR calc non Af Amer 61 (*) >90 mL/min   GFR calc Af Amer 71 (*) >90 mL/min   Comment: (NOTE)     The eGFR has been calculated using the CKD EPI equation.     This calculation has not been validated in all clinical situations.     eGFR's persistently <90 mL/min signify  possible Chronic Kidney     Disease.  APTT     Status: None   Collection Time    10/09/13 10:25 PM      Result Value Range   aPTT 33  24 - 37 seconds  PROTIME-INR     Status: None   Collection Time    10/09/13 10:25 PM      Result Value Range   Prothrombin Time 12.4  11.6 - 15.2 seconds   INR 0.94  0.00 - 1.49  POCT I-STAT TROPONIN I     Status: Abnormal   Collection Time    10/09/13 10:37 PM      Result Value Range   Troponin i, poc 1.21 (*) 0.00 - 0.08 ng/mL   Comment NOTIFIED PHYSICIAN     Comment 3            Comment: Due to the release kinetics of cTnI,     a negative result within the first hours     of the onset of symptoms does not rule out     myocardial infarction with certainty.     If myocardial infarction is still suspected,     repeat the test at appropriate intervals.  TYPE AND SCREEN     Status: None   Collection Time    10/09/13 10:45 PM      Result Value Range   ABO/RH(D) O POS     Antibody Screen NEG     Sample Expiration 10/12/2013    POCT ACTIVATED CLOTTING TIME     Status: None   Collection Time    10/10/13 12:11 AM      Result Value Range   Activated Clotting Time 481    TYPE AND SCREEN     Status: None   Collection Time    10/10/13  1:32 AM      Result Value Range   ABO/RH(D) O POS     Antibody Screen NEG     Sample Expiration 10/13/2013    ABO/RH     Status: None   Collection  Time    10/10/13  1:32 AM      Result Value Range   ABO/RH(D) O POS    POCT I-STAT 3, BLOOD GAS (G3+)     Status: Abnormal   Collection Time    10/10/13  2:34 AM      Result Value Range   pH, Arterial 7.241 (*) 7.350 - 7.450   pCO2 arterial 41.9  35.0 - 45.0 mmHg   pO2, Arterial 89.0  80.0 - 100.0 mmHg   Bicarbonate 18.0 (*) 20.0 - 24.0 mEq/L   TCO2 19  0 - 100 mmol/L   O2 Saturation 95.0     Acid-base deficit 9.0 (*) 0.0 - 2.0 mmol/L   Sample type ARTERIAL    MRSA PCR SCREENING     Status: None   Collection Time    10/10/13  3:29 AM      Result Value  Range   MRSA by PCR NEGATIVE  NEGATIVE   Comment:            The GeneXpert MRSA Assay (FDA     approved for NASAL specimens     only), is one component of a     comprehensive MRSA colonization     surveillance program. It is not     intended to diagnose MRSA     infection nor to guide or     monitor treatment for     MRSA infections.  POCT I-STAT 3, BLOOD GAS (G3+)     Status: Abnormal   Collection Time    10/10/13  3:55 AM      Result Value Range   pH, Arterial 7.236 (*) 7.350 - 7.450   pCO2 arterial 44.2  35.0 - 45.0 mmHg   pO2, Arterial 127.0 (*) 80.0 - 100.0 mmHg   Bicarbonate 19.4 (*) 20.0 - 24.0 mEq/L   TCO2 21  0 - 100 mmol/L   O2 Saturation 99.0     Acid-base deficit 9.0 (*) 0.0 - 2.0 mmol/L   Patient temperature 94.0 F     Collection site FEMORAL ARTERY     Drawn by Operator     Sample type ARTERIAL    URINALYSIS, ROUTINE W REFLEX MICROSCOPIC     Status: Abnormal   Collection Time    10/10/13  4:05 AM      Result Value Range   Color, Urine YELLOW  YELLOW   APPearance CLEAR  CLEAR   Specific Gravity, Urine <1.005 (*) 1.005 - 1.030   pH 5.0  5.0 - 8.0   Glucose, UA NEGATIVE  NEGATIVE mg/dL   Hgb urine dipstick NEGATIVE  NEGATIVE   Bilirubin Urine NEGATIVE  NEGATIVE   Ketones, ur 15 (*) NEGATIVE mg/dL   Protein, ur NEGATIVE  NEGATIVE mg/dL   Urobilinogen, UA 0.2  0.0 - 1.0 mg/dL   Nitrite NEGATIVE  NEGATIVE   Leukocytes, UA NEGATIVE  NEGATIVE   Comment: MICROSCOPIC NOT DONE ON URINES WITH NEGATIVE PROTEIN, BLOOD, LEUKOCYTES, NITRITE, OR GLUCOSE <1000 mg/dL.  CBC     Status: Abnormal   Collection Time    10/10/13  4:30 AM      Result Value Range   WBC 16.8 (*) 4.0 - 10.5 K/uL   RBC 4.29  3.87 - 5.11 MIL/uL   Hemoglobin 13.4  12.0 - 15.0 g/dL   HCT 08.6  57.8 - 46.9 %   MCV 93.7  78.0 - 100.0 fL   MCH 31.2  26.0 - 34.0 pg   MCHC 33.3  30.0 - 36.0 g/dL   RDW 14.7  82.9 - 56.2 %   Platelets 283  150 - 400 K/uL  BASIC METABOLIC PANEL     Status: Abnormal    Collection Time    10/10/13  4:30 AM      Result Value Range   Sodium 137  135 - 145 mEq/L   Potassium 4.0  3.5 - 5.1 mEq/L   Chloride 104  96 - 112 mEq/L   CO2 20  19 - 32 mEq/L   Glucose, Bld 345 (*) 70 - 99 mg/dL   BUN 22  6 - 23 mg/dL   Creatinine, Ser 1.30  0.50 - 1.10 mg/dL   Calcium 8.7  8.4 - 86.5 mg/dL   GFR calc non Af Amer 80 (*) >90 mL/min   GFR calc Af Amer >90  >90 mL/min   Comment: (NOTE)     The eGFR has been calculated using the CKD EPI equation.     This calculation has not been validated in all clinical situations.     eGFR's persistently <90 mL/min signify possible Chronic Kidney     Disease.  TROPONIN I     Status: Abnormal   Collection Time    10/10/13  4:30 AM      Result Value Range   Troponin I >20.00 (*) <0.30 ng/mL   Comment:            Due to the release kinetics of cTnI,     a negative result within the first hours     of the onset of symptoms does not rule out     myocardial infarction with certainty.     If myocardial infarction is still suspected,     repeat the test at appropriate intervals.     CRITICAL RESULT CALLED TO, READ BACK BY AND VERIFIED WITH:     CHURCH,H RN 10/10/2013 0543 JORDANS  HEMOGLOBIN A1C     Status: Abnormal   Collection Time    10/10/13  4:30 AM      Result Value Range   Hemoglobin A1C 5.8 (*) <5.7 %   Comment: (NOTE)                                                                               According to the ADA Clinical Practice Recommendations for 2011, when     HbA1c is used as a screening test:      >=6.5%   Diagnostic of Diabetes Mellitus               (if abnormal result is confirmed)     5.7-6.4%   Increased risk of developing Diabetes Mellitus     References:Diagnosis and Classification of Diabetes Mellitus,Diabetes     Care,2011,34(Suppl 1):S62-S69 and Standards of Medical Care in             Diabetes - 2011,Diabetes Care,2011,34 (Suppl 1):S11-S61.   Mean Plasma Glucose 120 (*) <117 mg/dL   Comment:  Performed at [facility]  LIPID PANEL     Status: Abnormal   Collection Time    10/10/13  4:30 AM      Result Value Range   Cholesterol  173  0 - 200 mg/dL   Triglycerides 73  <952 mg/dL   HDL 46  >84 mg/dL   Total CHOL/HDL Ratio 3.8     VLDL 15  0 - 40 mg/dL   LDL Cholesterol 132 (*) 0 - 99 mg/dL   Comment:            Total Cholesterol/HDL:CHD Risk     Coronary Heart Disease Risk Table                         Men   Women      1/2 Average Risk   3.4   3.3      Average Risk       5.0   4.4      2 X Average Risk   9.6   7.1      3 X Average Risk  23.4   11.0                Use the calculated Patient Ratio     above and the CHD Risk Table     to determine the patient's CHD Risk.                ATP III CLASSIFICATION (LDL):      <100     mg/dL   Optimal      440-102  mg/dL   Near or Above                        Optimal      130-159  mg/dL   Borderline      725-366  mg/dL   High      >440     mg/dL   Very High  MAGNESIUM     Status: None   Collection Time    10/10/13  4:30 AM      Result Value Range   Magnesium 1.7  1.5 - 2.5 mg/dL  GLUCOSE, CAPILLARY     Status: Abnormal   Collection Time    10/10/13  4:49 AM      Result Value Range   Glucose-Capillary 297 (*) 70 - 99 mg/dL  BLOOD GAS, ARTERIAL     Status: Abnormal   Collection Time    10/10/13  6:42 AM      Result Value Range   FIO2 0.70     Delivery systems VENTILATOR     Mode PRESSURE REGULATED VOLUME CONTROL     VT 500     Rate 24     Peep/cpap 5.0     pH, Arterial 7.406  7.350 - 7.450   pCO2 arterial 28.1 (*) 35.0 - 45.0 mmHg   pO2, Arterial 124.0 (*) 80.0 - 100.0 mmHg   Bicarbonate 17.7 (*) 20.0 - 24.0 mEq/L   TCO2 18.7  0 - 100 mmol/L   Acid-base deficit 6.4 (*) 0.0 - 2.0 mmol/L   O2 Saturation 99.1     Patient temperature 95.4     Collection site A-LINE     Drawn by [phone]     Sample type ARTERIAL     Allens test (pass/fail) PASS  PASS  GLUCOSE, CAPILLARY     Status: Abnormal   Collection  Time    10/10/13  7:24 AM      Result Value Range   Glucose-Capillary 307 (*) 70 - 99 mg/dL  PLATELET COUNT     Status: None   Collection  Time    10/10/13  7:42 AM      Result Value Range   Platelets 275  150 - 400 K/uL  TROPONIN I     Status: Abnormal   Collection Time    10/10/13  8:00 AM      Result Value Range   Troponin I >20.00 (*) <0.30 ng/mL   Comment:            Due to the release kinetics of cTnI,     a negative result within the first hours     of the onset of symptoms does not rule out     myocardial infarction with certainty.     If myocardial infarction is still suspected,     repeat the test at appropriate intervals.     CRITICAL VALUE NOTED.  VALUE IS CONSISTENT WITH PREVIOUSLY REPORTED AND CALLED VALUE.  CK TOTAL AND CKMB     Status: Abnormal   Collection Time    10/10/13  8:00 AM      Result Value Range   Total CK 3116 (*) 7 - 177 U/L   CK, MB >500.0 (*) 0.3 - 4.0 ng/mL   Comment: CRITICAL RESULT CALLED TO, READ BACK BY AND VERIFIED WITH:     K.BURGESS,RN 10/10/13 1025 BY BSLADE   Relative Index NOT CALCULATED  0.0 - 2.5  CBC     Status: Abnormal   Collection Time    10/10/13  2:00 PM      Result Value Range   WBC 21.7 (*) 4.0 - 10.5 K/uL   RBC 4.00  3.87 - 5.11 MIL/uL   Hemoglobin 12.3  12.0 - 15.0 g/dL   HCT 16.1  09.6 - 04.5 %   MCV 92.0  78.0 - 100.0 fL   MCH 30.8  26.0 - 34.0 pg   MCHC 33.4  30.0 - 36.0 g/dL   RDW 40.9  81.1 - 91.4 %   Platelets 243  150 - 400 K/uL  GLUCOSE, CAPILLARY     Status: Abnormal   Collection Time    10/10/13  3:09 PM      Result Value Range   Glucose-Capillary 142 (*) 70 - 99 mg/dL  TROPONIN I     Status: Abnormal   Collection Time    10/10/13  3:30 PM      Result Value Range   Troponin I >20.00 (*) <0.30 ng/mL   Comment: CRITICAL VALUE NOTED.  VALUE IS CONSISTENT WITH PREVIOUSLY REPORTED AND CALLED VALUE.                Due to the release kinetics of cTnI,     a negative result within the first hours     of  the onset of symptoms does not rule out     myocardial infarction with certainty.     If myocardial infarction is still suspected,     repeat the test at appropriate intervals.  CK TOTAL AND CKMB     Status: Abnormal   Collection Time    10/10/13  3:30 PM      Result Value Range   Total CK 2469 (*) 7 - 177 U/L   CK, MB 376.4 (*) 0.3 - 4.0 ng/mL   Comment: CRITICAL VALUE NOTED.  VALUE IS CONSISTENT WITH PREVIOUSLY REPORTED AND CALLED VALUE.   Relative Index 15.2 (*) 0.0 - 2.5    Ct Angio Chest W/cm &/or Wo Cm  10/09/2013   CLINICAL DATA:  Chest and abdominal  pain.  Assess for dissection.  EXAM: CT ANGIOGRAPHY CHEST, ABDOMEN AND PELVIS  TECHNIQUE: Multidetector CT imaging through the chest, abdomen and pelvis was performed using the standard protocol during bolus administration of intravenous contrast. Multiplanar reconstructed images including MIPs were obtained and reviewed to evaluate the vascular anatomy.  CONTRAST:  OMNIPAQUE IOHEXOL 350 MG/ML SOLN  COMPARISON:  None.  FINDINGS: CTA CHEST FINDINGS  There is no evidence of aortic dissection. Minimal calcific atherosclerotic disease is noted along the aortic arch and descending thoracic aorta, with minimal thrombus along the descending thoracic aorta. There is no evidence of aneurysmal dilatation. Aside from mild calcification along the proximal great vessels, the great vessels are grossly unremarkable in appearance.  Evaluation for pulmonary embolus is markedly limited given the phase of contrast enhancement; no central pulmonary embolus is seen.  Mild bilateral dependent subsegmental atelectasis is noted, most prominent along the medial aspect of the right lung. This appears to reflect underlying osteophytes. There is no evidence of pleural effusion or pneumothorax. No masses are identified; no abnormal focal contrast enhancement is seen.  The mediastinum is unremarkable in appearance. No mediastinal lymphadenopathy is seen. No pericardial  effusion is identified. No axillary lymphadenopathy is seen. The diminutive thyroid gland is unremarkable in appearance.  No acute osseous abnormalities are seen.  Review of the MIP images confirms the above findings.  CTA ABDOMEN AND PELVIS FINDINGS  There is no evidence of aortic dissection. Scattered calcific atherosclerotic disease is noted along the abdominal aorta and its branches. The celiac trunk, superior mesenteric artery, bilateral renal arteries and inferior mesenteric artery remain patent. Calcification is most prominent at the origin of the superior mesenteric artery, with perhaps mild associated luminal narrowing.  The liver and spleen are unremarkable in appearance. Several large stones are seen within the gallbladder; the gallbladder is otherwise unremarkable in appearance. The pancreas and adrenal glands are unremarkable.  The kidneys are unremarkable in appearance. There is no evidence of hydronephrosis. No renal or ureteral stones are seen. No perinephric stranding is appreciated.  No free fluid is identified. The small bowel is unremarkable in appearance. The stomach is within normal limits. No acute vascular abnormalities are seen.  The appendix is not definitely seen; there is no evidence for appendicitis. Scattered diverticulosis is noted along the distal descending and sigmoid colon, without evidence of diverticulitis.  The bladder is mildly distended and grossly unremarkable in appearance. The uterus is within normal limits. The ovaries are relatively symmetric; no suspicious adnexal masses are seen. No inguinal lymphadenopathy is seen.  No acute osseous abnormalities are identified.  Review of the MIP images confirms the above findings.  IMPRESSION: 1. No evidence of aortic dissection. Mild calcific atherosclerotic disease more prominent within the abdomen. Calcification is most prominent at the origin of the superior mesenteric artery, with perhaps mild associated luminal narrowing. 2. No  central pulmonary embolus seen, though evaluation for pulmonary embolus is limited given the phase of contrast enhancement. 3. Mild bilateral dependent subsegmental atelectasis noted; lungs otherwise clear. 4. Cholelithiasis; gallbladder otherwise unremarkable in appearance. 5. Scattered diverticulosis along the distal descending and sigmoid colon, without evidence of diverticulitis.   Electronically Signed   By: Roanna Raider M.D.   On: 10/09/2013 23:27   Ct Angio Abdomen W/cm &/or Wo Contrast  10/09/2013   CLINICAL DATA:  Chest and abdominal pain.  Assess for dissection.  EXAM: CT ANGIOGRAPHY CHEST, ABDOMEN AND PELVIS  TECHNIQUE: Multidetector CT imaging through the chest, abdomen and pelvis was performed  using the standard protocol during bolus administration of intravenous contrast. Multiplanar reconstructed images including MIPs were obtained and reviewed to evaluate the vascular anatomy.  CONTRAST:  OMNIPAQUE IOHEXOL 350 MG/ML SOLN  COMPARISON:  None.  FINDINGS: CTA CHEST FINDINGS  There is no evidence of aortic dissection. Minimal calcific atherosclerotic disease is noted along the aortic arch and descending thoracic aorta, with minimal thrombus along the descending thoracic aorta. There is no evidence of aneurysmal dilatation. Aside from mild calcification along the proximal great vessels, the great vessels are grossly unremarkable in appearance.  Evaluation for pulmonary embolus is markedly limited given the phase of contrast enhancement; no central pulmonary embolus is seen.  Mild bilateral dependent subsegmental atelectasis is noted, most prominent along the medial aspect of the right lung. This appears to reflect underlying osteophytes. There is no evidence of pleural effusion or pneumothorax. No masses are identified; no abnormal focal contrast enhancement is seen.  The mediastinum is unremarkable in appearance. No mediastinal lymphadenopathy is seen. No pericardial effusion is identified. No  axillary lymphadenopathy is seen. The diminutive thyroid gland is unremarkable in appearance.  No acute osseous abnormalities are seen.  Review of the MIP images confirms the above findings.  CTA ABDOMEN AND PELVIS FINDINGS  There is no evidence of aortic dissection. Scattered calcific atherosclerotic disease is noted along the abdominal aorta and its branches. The celiac trunk, superior mesenteric artery, bilateral renal arteries and inferior mesenteric artery remain patent. Calcification is most prominent at the origin of the superior mesenteric artery, with perhaps mild associated luminal narrowing.  The liver and spleen are unremarkable in appearance. Several large stones are seen within the gallbladder; the gallbladder is otherwise unremarkable in appearance. The pancreas and adrenal glands are unremarkable.  The kidneys are unremarkable in appearance. There is no evidence of hydronephrosis. No renal or ureteral stones are seen. No perinephric stranding is appreciated.  No free fluid is identified. The small bowel is unremarkable in appearance. The stomach is within normal limits. No acute vascular abnormalities are seen.  The appendix is not definitely seen; there is no evidence for appendicitis. Scattered diverticulosis is noted along the distal descending and sigmoid colon, without evidence of diverticulitis.  The bladder is mildly distended and grossly unremarkable in appearance. The uterus is within normal limits. The ovaries are relatively symmetric; no suspicious adnexal masses are seen. No inguinal lymphadenopathy is seen.  No acute osseous abnormalities are identified.  Review of the MIP images confirms the above findings.  IMPRESSION: 1. No evidence of aortic dissection. Mild calcific atherosclerotic disease more prominent within the abdomen. Calcification is most prominent at the origin of the superior mesenteric artery, with perhaps mild associated luminal narrowing. 2. No central pulmonary embolus  seen, though evaluation for pulmonary embolus is limited given the phase of contrast enhancement. 3. Mild bilateral dependent subsegmental atelectasis noted; lungs otherwise clear. 4. Cholelithiasis; gallbladder otherwise unremarkable in appearance. 5. Scattered diverticulosis along the distal descending and sigmoid colon, without evidence of diverticulitis.   Electronically Signed   By: Roanna Raider M.D.   On: 10/09/2013 23:27   Portable Chest Xray  10/10/2013   CLINICAL DATA:  Assess endotracheal tube placement.  EXAM: PORTABLE CHEST - 1 VIEW  COMPARISON:  Chest radiograph performed 01/27/2013, and CTA of the chest performed 10/09/2013  FINDINGS: The patient's endotracheal tube is seen ending 4 cm above the carina. Enteric tube is noted extending below the diaphragm.  There is new diffuse left-sided airspace opacification; this raises concern for  asymmetric flash pulmonary edema. More mild right perihilar airspace opacity is seen. No pleural effusion or pneumothorax is identified.  The cardiomediastinal silhouette is borderline normal in size. No acute osseous abnormalities are seen. External pacing pads are noted.  IMPRESSION: 1. Endotracheal tube seen ending 4 cm above the carina. 2. New diffuse left-sided airspace opacification raises concern for asymmetric flash pulmonary edema. More mild right perihilar airspace opacity also seen.   Electronically Signed   By: Roanna Raider M.D.   On: 10/10/2013 05:04   Dg Abd Portable 1v  10/10/2013   CLINICAL DATA:  Orogastric tube placement.  EXAM: PORTABLE ABDOMEN - 1 VIEW  COMPARISON:  CTA of the abdomen performed 10/09/2013  FINDINGS: The patient's enteric tube is noted extending across the expected location of the stomach and likely to the duodenum, though its tip is not visualized. The visualized bowel gas pattern is grossly unremarkable.  Diffuse left-sided airspace opacification is better characterized on concurrent chest radiograph. External pacing pads  are noted. No acute osseous abnormalities are seen.  IMPRESSION: 1. Enteric tube noted extending across the stomach and likely to the duodenum, though its tip is not visualized. Unremarkable bowel gas pattern. 2. Diffuse left-sided airspace opacification, better characterized on concurrent chest radiograph.   Electronically Signed   By: Roanna Raider M.D.   On: 10/10/2013 05:08    Review of Systems  Unable to perform ROS: intubated   Blood pressure 147/50, pulse 63, temperature 99.3 F (37.4 C), temperature source Oral, resp. rate 12, height 5\' 6"  (1.676 m), weight 220 lb 7.4 oz (100 kg), SpO2 99.00%. Physical Exam  Vitals reviewed. Constitutional:  Intubated and sedated on cath table with catheterization in progress when I was in to see her.  Neurological:  sedated    Assessment/Plan:  Melinda. Noble was still having CP on arrival to the cath lab. Dr. [name] did an urgent cath. She had totally occluded LAD. He was able to cross with a wire and ballooned the vessel reestablishing some flow. A stent was placed and initially there appeared to be good distal flow. Her ST segments did remain elevated. A post deployment dilatation was done and with contrast injection after that there was a no reflow phenomenon.   I was called to the cath lab at this time (~1230 AM). Additional dilatations re-established some flow distally. An additional stent was placed at the distal end of the first in case there was a dissection at the distal end of the stent or at the origin of the diagonal branch.   Dr. Excell Seltzer arrived. There continued to be issues with poor outflow despite no apparent anatomic issues. I did not feel that CABG would be helpful in this setting as the issue was outflow not inflow. However, I stayed in the lab throughout the remainder of the procedure in case surgical intervention was necessary.  An IVUS was done and showed no hemodynamically significant stenosis or dissection. The overall picture  was consistent with no reflow. The consensus among Dr. [name], Dr. Excell Seltzer and myself was that no further revascularization was possible and there was no role for surgery.  I was in the cath lab for 2 hours   Daniqua Campoy C 10/10/2013, 6:51 PM

## 2013-10-10 NOTE — Progress Notes (Signed)
Pt switched to full support due to decreased minute ventilation.

## 2013-10-10 NOTE — Progress Notes (Signed)
Echo Lab  2D Echocardiogram completed.  Rhodesia Stanger L Marasia Newhall, RDCS 10/10/2013 9:39 AM

## 2013-10-10 NOTE — CV Procedure (Addendum)
CARDIAC CATHETERIZATION AND PERCUTANEOUS CORONARY INTERVENTION REPORT  NAME:  Melinda Noble   MRN: [id number] DOB:  1930-06-01   ADMIT DATE: 10/09/2013 Procedure Date: 10/10/2013  INTERVENTIONAL CARDIOLOGIST: Marykay Lex, M.D., MS PRIMARY CARE PROVIDER: Pearla Dubonnet, MD PRIMARY CARDIOLOGIST: Marykay Lex  PATIENT:  Melinda Noble is a 79 y.o. female with a history of hypertension and hypothyroidism but no prior coronary disease, as well as reportedly well controlled lipids. Earlier in the day 10/09/2013, she noted severe pain localizes in her chest and between her shoulder blades radiating to both shoulders.  He initially went to her primary physician's office where an ECG failed to show any abnormalities. The pain persisted throughout the day the patient then presented to [facility] Emergency Room , where she was found to be quite hypertensive and described as sharp tearing type pain that sounded concerning for possible aortic dissection. She had diffuse anterolateral ST elevations but without reciprocal changes, in fact there is mild inferior solutions also noted. Based on these findings, and the concern of how she presented with the description or symptoms and hypertension, a baseline troponin was checked and she was taken for emergent CT Angiogram of the chest to rule out possible aortic dissection prior to anticoagulation. Her initial troponin was 1.2, was felt to be relatively low for ongoing chest pain all day long with possible anterior ST elevations. The CT scan did not show any dissection, however upon arriving back to the emergency room the patient's pain got progressively worse, and a repeat ECG showed clear-cut anterior ST elevations of at least 3-4 mm with reciprocal changes present in at this time it was called the case was transported to the [facility]iac Catheter[facility] Lab via the emergency room for stabilization. Upon arrival she was in 9-10 out of 10  pain with significant anterior situations noted. The decision made to proceed with emergent cardiac catheterization.   PRE-OPERATIVE DIAGNOSIS:    ANTERIOR STEMI  PROCEDURES PERFORMED:    CORONARY ANGIOGRAPHY   COMPLEX PERCUTANEOUS CORONARY INTERVENTION OF 100% THROMBOTICALLY OCCLUDED MID LAD WITH PRE-DILATION RELATED DISSECTION WITH SUBINTIMAL HEMATOMA - PCI WITH 2 OVERLAPPING DES PROMUS PREMIER (2.5 MM X 38 MM proximal; 2.5 MM X 12 MM distal overlap - post-dilated to ~3 mm)  INTRACORONARY THROMBECTOMY  INTRAVASCULAR ULTRASOUND OF DISTAL TO MID LAD  PTCA OF DISTAL LAD   PLACEMENT OF CENTRAL VENOUS LINE - RIGHT COMMON FEMORAL VEIN  PLACEMENT OF RIGHT COMMON FEMORAL ARTERIAL SHEATH FOR POSSIBLE IABP  INTUBATION - BY ANESTHESIOLOGY  PROCEDURE:Consent:  Risks of procedure as well as the alternatives and risks of each were explained to the (patient/caregiver).  Emergency Verbal Consent for procedure obtained -- Unable to obtain consent because of emergent medical necessity.  PROCEDURE: The patient was brought to the 2nd Floor [facility] in the fasting state and prepped and draped in the usual sterile fashion for Right groin or radial access. A modified Allen's test with plethysmography was performed, revealing excellent Ulnar artery collateral flow.  Sterile technique was used including antiseptics, cap, gloves, gown, hand hygiene, mask and sheet.  Skin prep: Chlorhexidine.  Time Out: Verified patient identification, verified procedure, site/side was marked, verified correct patient position, special equipment/implants available, medications/allergies/relevent history reviewed, required imaging and test results available.  Performed  Access: RIGHT RADIAL Artery; 6 Fr Sheath -- Seldinger technique (Angiocath Micropuncture Kit)  IA Radial Cocktail, IV ANGIOMAX Diagnostic:   5 Fr TIG 4.0, 6 Fr XB LAD  3.5 advanced and exchanged over long exchange safety J  wire  Left & Right Coronary Artery Angiography: TIG 4.0  After Initial No-Reflow post Stent dilation & Intubation -- The decision was made to obtain Emergent Femoral Arterial & Venous Access for possible IABP & Vasopressor medications.  Right Common Femoral Artery & Vein - Unfortunately the patient coughed during initial access attempt & access was lost; pressure held x 4 min; then first Venous followed by Arterial Access was obtained.  Arterial Access: RFA - 6 Fr; Modified Seldinger technique (very difficult to advance wire, required needle repositioning)  Venous Access: RFV - 6 Fr; Seldinger Technique   PCI:  6 Fr XB LAD 3.5  TR Band:  1445 Hours, 13 mL air (small hematoma noted - pt was moving arm extensively during procedure.  RFA & RFV lines sutured into place & manual pressure held x 2 for ~15-20 min for hematoma noted after sheath placement; site was soft upon transfer to CCU.  MEDICATIONS:  Anesthesia:  Local Lidocaine 2 ml - R wrist; 15 ml R Groin.  Sedation:  2 mg IV Versed, 100 mcg IV fentanyl ;  Followed by Fentanyl & Versed gtt  Premedication: 125 mcg of intravenous Solu-Medrol, 50 mg IV Benadryl and 20 mg IV Pepcid given at [facility] long prior to CT angiogram of the chest  Omnipaque Contrast: 300-320 ml  Anticoagulation:  Angiomax Bolus & drip  Anti-Platelet Agent:  Aggrastat single bolus with drip administered after initial stent deployment. Radial Cocktail: 5 mg Verapamil, 400 mcg NTG, 2 ml 2% Lidocaine in 10 ml NS Intracoronary Nitroglycerin 200 mcg x3 injections Intracoronary Adenosine 12 mcg x2 injections Levophed drip initiated at 10 mcg per kilogram per minute increased to 20 mcg kilogram per minute  Hemodynamics:  Central Aortic / Mean Pressures: Initial pressure is 145/75 mmHg; final pressure on 20 mcg of Levophed 124/70 mmHg  Left Ventriculography: Aortic valve was not crossed, and she plans were to perform LV gram after PCI, however the activity or  seizure and the patient's instability decision was made to the procedure  Coronary Anatomy:  Left Main: Large-caliber vessel that bifurcates into a small caliber nondominant Circumflex and a moderate to large caliber LAD LAD: Large arge-caliber vessel the proximal 50% eccentric lesion. After this there is a moderate caliber first diagonal branch and a significant septal perforator trunk followed by a 100% occlusion of the LAD in the mid vessel.  Following initial angioplasty, a focal dissection is noted just beyond the initial occlusion site. This did not appear to extend to the level of the second diagonal branch which was also a moderate caliber vessel. The LAD distally remained relatively large-caliber vessel distally is a wraps the apex.  D1: Moderate caliber relatively proximal branch that was not involved with the stenosis. However there was some diffuse irregularities at the takeoff of this vessel that required a stent the past proximal to the takeoff.  D2: Small moderate caliber vessel also not involved lesion, but jeopardized by the upstream parent vessel occlusion  Left Circumflex: Small moderate caliber vessel that gives off a small AV groove branch then bifurcates into OM 1 and OM 2 with minimal luminal irregularities.   RCA: Very large caliber, dominant vessel with a inferior takeoff. There is a 60-70% short acentric stenosis at the first bend. The vessel then mobilize into a large caliber vessel that bifurcates distally into the Right Posterior Descending Artery and the  Right Posterior AV Groove Branch (RPAV); after the initial  lesion, there minimal luminal irregularities.  RPDA: Large-caliber vessel it reaches all again the apex. Minimal luminal irregularities.  RPL Sysytem:The RPAV he is a moderate caliber vessel that essentially terminates as a large posterior lateral branch.  Initial review of the angiography were revealed the clear-cut culprit lesion the pancreas elevations  being the mid LAD occlusion. Preparations were then made to proceed with percutaneous intervention. IV Angiomax was used at time of sheath placement. Plans were to administer Brilinta, however Echinacea she the procedure patient was requiring nonrebreather mask for oxygenation and was quite nauseated. Therefore there was concern that she would not be to keep the medication down. I then decided to use IV Aggrastat single bolus.  Percutaneous Coronary Intervention:    100% mid LAD occlusion followed by dissection with intramural hematoma.  0% stenosis post-PCI  TIMI 0 flow pre-PCI TIMI 2 flow post PCI Guide: 6 Fr   XB LAD  Guidewire: A total of 4 wires were used --2 BMW wires (initial wire was BMW), A Pro-Water Soft wire followed by a Luge wire final PCI wire was Luge)  The initial BMW wire had difficulty passing beyond the first septal trunk, therefore the predilation balloon was advanced for wire support, wire then seem to easily into the distal LAD in several occasions passing into small branches, therefore confirming that the wire remained intraluminal. Predilation Balloon: Sprinter Legend 2.0 mm x 15 mm;   8 Atm x 30 Sec, 8 Atm x 40 Sec,  After initial inflations there did appear to be a initially focal dissection just distal to the initial occlusion.  Third inflation at the dissection site: 8 Atm x 45 Sec  At this point decision was made to cover the entire section with a stent. The area in question range from just at the D1 takeoff to just proximal to D2 Stent #1: Promus Premier 2.5 mm x 38 mm;   Initial deployment: 12 Atm x 30 Sec, post inflation angiography revealed restoration of flow with no clear evidence of residual dissection, therefore the balloon was used to post dilate gently.  Post-dilation with stent balloon: 16 Atm x 45 Sec  Unfortunately after this inflation, no reflow phenomenon was noted beyond the proximal portion of the stent. At this point the patient had significant  changes and worsening of the anterior ST elevations. Her pain became close to unbearable.  1 pass with the Pronto aspiration thrombectomy catheter was performed, with no significant benefit.  Cover more inflations with the original predilation balloon were performed at 8 and 12 atmospheres for 50 seconds along the stented segment.  It was at this point that the Aggrastat was initiated.   With increased sedation, the patient's respiratory status became somewhat compromised due to a history of OSA. At this point, the decision made to have anesthesia come to assist with intubation. Right common femoral arterial and venous lines were placed, with some difficulty initially with arterial access.  Pressure was held when a hematoma was noted to be present.  CT surgery consultation was also obtained and Dr. Dorris Fetch graciously came in. At this point there was concern of possible propagation of the dissection. After some consideration the decision made to place a second stent overlapping the distal portion of the first stent as there was the appearance of possible subintimal hematoma. Several additional wires were used to ensure that the initial wire was intraluminal. Each wire was easily passed into a branch of the main LAD channel.  Stent #2: Promus Premier 2.5  mm x 12 mm; 3 wires were placed including 2 BMW wires in the D2 and distal S/P branches with the working wire now being the Luge that was clearly noted to pass into a diagonal branch and a septal perforator beyond the stented segment before being passed distally. The 2 BMW wires were removed before deployment of the stent.  Initial deployment: 12 Atm x 30 Sec, post inflation angiography revealed restoration of flow with no clear evidence of residual dissection, therefore the balloon was used to post dilate gently at the overlapped segment.  Post-dilation with stent balloon: 14 Atm x 45 Sec  Again post inflation angiography revealed poor/slow  reflow.  Intracoronary adenosine was administered, somewhat improved flow was noted initially but then all by again no reflow phenomenon. At this point Dr. Excell Seltzer graciously came in to be of assistance. After reviewing the images, we decided to perform intravascular ultrasound to determine if the dissection was fully covered, and the extent of intramural hematoma  Intravascular Ultrasound (IVUS)  The IVUS catheter was advanced over the luge wire down to the distal LAD and a complete pullback was performed. This demonstrated normal lumen downstream beyond the stent, with a well covered dissection plane and intramural hematoma.  The IVUS catheter was then removed. The decision was made to perform low grade inflations with a compliant balloon throughout the entire distal LAD and stented segment. The vessel downstream appear to be close to 3 mm diameter. Stent segment did appear to be relatively well apposed.  Post-dilation Balloon: Emerge Monorail compliant balloon 3.0 mm x 30 mm   Inflations were made at the distal stent extending distally for 2 overlapping inflations, then the balloon was pulled back into the stented segment for post dilation of the stent.  At distal stent overlap: 6  Atm x 45  Sec, (3.0 mm)  Distal LAD: 4 Atm x 45 Sec (2.85 mm)   2 inflations within the stented segment: 12 Atm x 45 Sec; Final Diameter: 3.0 mm    additional injections of intracoronary nitroglycerin glycerin and intracoronary adenosine were administered.  Post deployment angiography in multiple views, with and without guidewire in place revealed excellent stent deployment and lesion coverage, However there were only remained TIMI 2 flow post PCI that did reach all the way down to the apex on final angiography.  There was no evidence of  residual dissection or perforation.   Angiomax was discontinued at this point  At this point, the decision was made to terminate the procedure. The plan for using a pigtail  catheter across aortic valve was abandoned due to  the duration of procedure.  An echocardiogram will be ordered.  After completion of the PCI the catheter was removed with the out of body over wire.  The Radial sheath was removed with placement of a TR band. A mild amount hematoma was noted due to extensive movement of the sheath. Direct manual pressure was held on the right groin for 20 minutes with the arterial and venous sheaths sutured in place.  Minimal residual hematomas noted, therefore decision was made not to place a FemoStop.  Type and cross was checked for precautionary measures.  PATIENT DISPOSITION:    The patient was transferred to the CCU intubated and critically ill, however essentially hemodynamicaly stable in addition, albeit on Levophed.  There was definite improvement in the ECG post PCI.  The patient became hypoxic, and hypotensive with extreme ST elevations during the initial slow reflow timeframe. She was intubated for  airway management, partly because of hypoxia and to allow for no sedation but also noted as the patient noted being extremely nauseated.    EBL:  External EBL likely less than 40 ml, however in the femoral hematoma quite likely 50-60 mL additional loss minimum.   The patient  was initially unstable, stabilized with intubation and pressors followed by completed PCI with restoration of TIMI 2 flow in the LAD.  POST-OPERATIVE DIAGNOSIS:    100% thrombotic occluded mid LAD associated dissection. It is unclear whether this was a spontaneous dissection versus related to the initial predilation balloon angioplasty. There is presence of intramural thrombus at the initial dissection site.    Successful complex PCI of a long segment of LAD using 2 overlapping Promus Premier DES stents as described incorporating balloon angioplasty, aspiration thrombectomy and intracoronary/IVUS with restoration of TIMI 2 flow after initial no reflow phenomenon.  Suspect that the  patient's prolonged chest pain may very well have indicated prolonged LAD occlusion and therefore myocardial stunning leading to no reflow phenomenon after balloon angioplasty, dissection and intramural hematoma  Moderate proximal LAD disease and moderate to significant proximal RCA disease in a large dominant RCA  Likely large anterior infarct, will confirm with echocardiogram, associated cardiogenic shock requiring vasopressor therapy  Hypoxic respiratory failure, likely secondary to sedation plus or minus Pulmonary edema requiring emergent intubation by anesthesiology  Moderate sized right femoral hematoma and small to moderate right radial hematoma, both appear stable upon transfer to CCU.  Mixed respiratory and metabolic acidosis  PLAN OF CARE:  Admit to CCU, with critical care to assist with ventilation status. May require intravenous bicarbonate for acidosis  Post radial cath care  Will remove femoral lines in the morning once hemodynamic stable, and no need for central venous access  Administer loading dose of Ticagrelor per OG tube once placed  Would hope to begin to wean Levophed throughout the morning. Monitor for reperfusion arrhythmias and bradycardia  Monitor for signs of heart failure, may require diuresis, for now we'll allow for pressure stabilization as she is intubated. Consider diuresis as needed for oxygenation, and based on echocardiographic findings.  Right heart catheterization was not performed due to to the prolonged intervention procedure. I personally spent 20-25 minutes discussing the patient's prognosis, presentation, and the complete procedure with the patient family including her husband, 2 sons and 2 daughters along with one son-in-law.    Marykay Lex, M.D., M.S. [facility] [address] [place], [place]  [address]  [phone]  10/10/2013 3:29 AM

## 2013-10-10 NOTE — Consult Note (Signed)
Name: Melinda Noble MRN: [id number] DOB: Jan 28, 1930    ADMISSION DATE:  10/09/2013 CONSULTATION DATE:  10/10/2013  REFERRING MD :  [name] PRIMARY SERVICE: Cards  CHIEF COMPLAINT:  Chest pain  BRIEF PATIENT DESCRIPTION: 79/F with acute AWMI, CT angio neg for dissection, underwent PTCA to LAD , course complicated by resp failure due to pulmonary edema & cardiogenic shock.  SIGNIFICANT EVENTS / STUDIES:  CT angio 12/22 neg dissection  LINES / TUBES: ETT 12/23 >> Rt fem CVL 12/23 (cards) >>  CULTURES: resp 12/23 >>  ANTIBIOTICS: Cefazolin 12/23 >>  HISTORY OF PRESENT ILLNESS:  79 year old woman with history of HTN and hypothyroidism, who presented to the [facility] ED with back pain & J point 1mm elevation on initial EKG. CT angio neg for dissection. STEMi was evident on follow up ekg. Underwent emergent PTCA/ stent  to LAD with timi-2 flow. Course coplicated by apneic episodes requiring intubation & hypotension requiring pressors,   PAST MEDICAL HISTORY :  Past Medical History  Diagnosis Date  . Hypertension   . Migraine headache   . Thyroid disease   . Depression    Past Surgical History  Procedure Laterality Date  . Shoulder surgery     Prior to Admission medications   Medication Sig Start Date End Date Taking? Authorizing Provider  atenolol (TENORMIN) 50 MG tablet Take 25 mg by mouth 2 (two) times daily.    Historical Provider, MD  Biotin 1000 MCG tablet Take 1,000 mcg by mouth daily.    Historical Provider, MD  calcium-vitamin D (OSCAL WITH D) 500-200 MG-UNIT per tablet Take 2 tablets by mouth daily.    Historical Provider, MD  cholecalciferol (VITAMIN D) 1000 UNITS tablet Take 1,000 Units by mouth daily.    Historical Provider, MD  clorazepate (TRANXENE) 3.75 MG tablet Take 0.9375 mg by mouth 2 (two) times daily as needed for anxiety.    Historical Provider, MD  levothyroxine (SYNTHROID, LEVOTHROID) 100 MCG tablet Take 100 mcg by mouth daily before breakfast.     Historical Provider, MD  metoCLOPramide (REGLAN) 10 MG tablet Take 1 tablet (10 mg total) by mouth every 6 (six) hours as needed (Nausea or headache). 05/10/13   Dione Booze, MD  Multiple Vitamins-Minerals (MULTIVITAMIN WITH MINERALS) tablet Take 1 tablet by mouth daily.    Historical Provider, MD  omeprazole (PRILOSEC) 20 MG capsule Take 20 mg by mouth daily.    Historical Provider, MD  sertraline (ZOLOFT) 50 MG tablet Take 50 mg by mouth daily.    Historical Provider, MD  traMADol (ULTRAM) 50 MG tablet Take 1 tablet (50 mg total) by mouth every 6 (six) hours as needed for pain. 05/10/13   Dione Booze, MD  vitamin E (VITAMIN E) 400 UNIT capsule Take 400 Units by mouth daily.    Historical Provider, MD   Allergies  Allergen Reactions  . Iohexol      Code: HIVES, Desc: anaphylactic shock s/p contrast injection many yrs ago--suggested that pt NEVER have iv contrast//a.c., Onset Date: 40981191   . Penicillins Nausea Only  . Sulfa Antibiotics Rash    FAMILY HISTORY:  No family history on file. SOCIAL HISTORY:  reports that she has quit smoking. She does not have any smokeless tobacco history on file. She reports that she does not drink alcohol or use illicit drugs.  REVIEW OF SYSTEMS:  Unable to obtain since intubated  SUBJECTIVE:   VITAL SIGNS: Temp:  [95.4 F (35.2 C)-98.1 F (36.7 C)] 95.4 F (35.2  C) (12/23 0345) Pulse Rate:  [51-131] 85 (12/23 0500) Resp:  [12-24] 24 (12/23 0500) BP: (77-182)/(22-110) 142/48 mmHg (12/23 0500) SpO2:  [91 %-100 %] 99 % (12/23 0500) Arterial Line BP: (111-155)/(52-68) 137/66 mmHg (12/23 0500) FiO2 (%):  [80 %-100 %] 80 % (12/23 0500) Weight:  [95.255 kg (210 lb)-100 kg (220 lb 7.4 oz)] 100 kg (220 lb 7.4 oz) (12/23 0345) HEMODYNAMICS:   VENTILATOR SETTINGS: Vent Mode:  [-] PRVC FiO2 (%):  [80 %-100 %] 80 % Set Rate:  [16 bmp-24 bmp] 24 bmp Vt Set:  [500 mL] 500 mL PEEP:  [5 cmH20] 5 cmH20 Plateau Pressure:  [17 cmH20] 17 cmH20 INTAKE /  OUTPUT: Intake/Output     12/22 0701 - 12/23 0700   I.V. (mL/kg) 217.9 (2.2)   IV Piggyback 50   Total Intake(mL/kg) 267.9 (2.7)   Urine (mL/kg/hr) 450   Total Output 450   Net -182.1         PHYSICAL EXAMINATION: Gen.acutely ill, well-nourished, in no distress, sedated ENT - no lesions, no post nasal drip Neck: No JVD, no thyromegaly, no carotid bruits Lungs: no use of accessory muscles, no dullness to percussion, clear without rales or rhonchi  Cardiovascular: Rhythm regular, heart sounds  normal, no murmurs, no peripheral edema Abdomen: soft and non-tender, no hepatosplenomegaly, BS normal. Musculoskeletal: No deformities, no cyanosis or clubbing Neuro:  Seadted, unresponsive Skin:  Warm, no lesions/ rash   LABS:  CBC  Recent Labs Lab 10/09/13 2220 10/10/13 0430  WBC 11.6* 16.8*  HGB 14.9 13.4  HCT 44.9 40.2  PLT 243 283   Coag's  Recent Labs Lab 10/09/13 2225  APTT 33  INR 0.94   BMET  Recent Labs Lab 10/09/13 2220  NA 136  K 3.8  CL 102  CO2 22  BUN 26*  CREATININE 0.87  GLUCOSE 147*   Electrolytes  Recent Labs Lab 10/09/13 2220  CALCIUM 10.3   Sepsis Markers No results found for this basename: LATICACIDVEN, PROCALCITON, O2SATVEN,  in the last 168 hours ABG  Recent Labs Lab 10/10/13 0355  PHART 7.236*  PCO2ART 44.2  PO2ART 127.0*   Liver Enzymes No results found for this basename: AST, ALT, ALKPHOS, BILITOT, ALBUMIN,  in the last 168 hours Cardiac Enzymes No results found for this basename: TROPONINI, PROBNP,  in the last 168 hours Glucose No results found for this basename: GLUCAP,  in the last 168 hours  Imaging Ct Angio Chest W/cm &/or Wo Cm  10/09/2013   CLINICAL DATA:  Chest and abdominal pain.  Assess for dissection.  EXAM: CT ANGIOGRAPHY CHEST, ABDOMEN AND PELVIS  TECHNIQUE: Multidetector CT imaging through the chest, abdomen and pelvis was performed using the standard protocol during bolus administration of intravenous  contrast. Multiplanar reconstructed images including MIPs were obtained and reviewed to evaluate the vascular anatomy.  CONTRAST:  OMNIPAQUE IOHEXOL 350 MG/ML SOLN  COMPARISON:  None.  FINDINGS: CTA CHEST FINDINGS  There is no evidence of aortic dissection. Minimal calcific atherosclerotic disease is noted along the aortic arch and descending thoracic aorta, with minimal thrombus along the descending thoracic aorta. There is no evidence of aneurysmal dilatation. Aside from mild calcification along the proximal great vessels, the great vessels are grossly unremarkable in appearance.  Evaluation for pulmonary embolus is markedly limited given the phase of contrast enhancement; no central pulmonary embolus is seen.  Mild bilateral dependent subsegmental atelectasis is noted, most prominent along the medial aspect of the right lung. This appears to  reflect underlying osteophytes. There is no evidence of pleural effusion or pneumothorax. No masses are identified; no abnormal focal contrast enhancement is seen.  The mediastinum is unremarkable in appearance. No mediastinal lymphadenopathy is seen. No pericardial effusion is identified. No axillary lymphadenopathy is seen. The diminutive thyroid gland is unremarkable in appearance.  No acute osseous abnormalities are seen.  Review of the MIP images confirms the above findings.  CTA ABDOMEN AND PELVIS FINDINGS  There is no evidence of aortic dissection. Scattered calcific atherosclerotic disease is noted along the abdominal aorta and its branches. The celiac trunk, superior mesenteric artery, bilateral renal arteries and inferior mesenteric artery remain patent. Calcification is most prominent at the origin of the superior mesenteric artery, with perhaps mild associated luminal narrowing.  The liver and spleen are unremarkable in appearance. Several large stones are seen within the gallbladder; the gallbladder is otherwise unremarkable in appearance. The pancreas and  adrenal glands are unremarkable.  The kidneys are unremarkable in appearance. There is no evidence of hydronephrosis. No renal or ureteral stones are seen. No perinephric stranding is appreciated.  No free fluid is identified. The small bowel is unremarkable in appearance. The stomach is within normal limits. No acute vascular abnormalities are seen.  The appendix is not definitely seen; there is no evidence for appendicitis. Scattered diverticulosis is noted along the distal descending and sigmoid colon, without evidence of diverticulitis.  The bladder is mildly distended and grossly unremarkable in appearance. The uterus is within normal limits. The ovaries are relatively symmetric; no suspicious adnexal masses are seen. No inguinal lymphadenopathy is seen.  No acute osseous abnormalities are identified.  Review of the MIP images confirms the above findings.  IMPRESSION: 1. No evidence of aortic dissection. Mild calcific atherosclerotic disease more prominent within the abdomen. Calcification is most prominent at the origin of the superior mesenteric artery, with perhaps mild associated luminal narrowing. 2. No central pulmonary embolus seen, though evaluation for pulmonary embolus is limited given the phase of contrast enhancement. 3. Mild bilateral dependent subsegmental atelectasis noted; lungs otherwise clear. 4. Cholelithiasis; gallbladder otherwise unremarkable in appearance. 5. Scattered diverticulosis along the distal descending and sigmoid colon, without evidence of diverticulitis.   Electronically Signed   By: Roanna Raider M.D.   On: 10/09/2013 23:27   Ct Angio Abdomen W/cm &/or Wo Contrast  10/09/2013   CLINICAL DATA:  Chest and abdominal pain.  Assess for dissection.  EXAM: CT ANGIOGRAPHY CHEST, ABDOMEN AND PELVIS  TECHNIQUE: Multidetector CT imaging through the chest, abdomen and pelvis was performed using the standard protocol during bolus administration of intravenous contrast. Multiplanar  reconstructed images including MIPs were obtained and reviewed to evaluate the vascular anatomy.  CONTRAST:  OMNIPAQUE IOHEXOL 350 MG/ML SOLN  COMPARISON:  None.  FINDINGS: CTA CHEST FINDINGS  There is no evidence of aortic dissection. Minimal calcific atherosclerotic disease is noted along the aortic arch and descending thoracic aorta, with minimal thrombus along the descending thoracic aorta. There is no evidence of aneurysmal dilatation. Aside from mild calcification along the proximal great vessels, the great vessels are grossly unremarkable in appearance.  Evaluation for pulmonary embolus is markedly limited given the phase of contrast enhancement; no central pulmonary embolus is seen.  Mild bilateral dependent subsegmental atelectasis is noted, most prominent along the medial aspect of the right lung. This appears to reflect underlying osteophytes. There is no evidence of pleural effusion or pneumothorax. No masses are identified; no abnormal focal contrast enhancement is seen.  The mediastinum  is unremarkable in appearance. No mediastinal lymphadenopathy is seen. No pericardial effusion is identified. No axillary lymphadenopathy is seen. The diminutive thyroid gland is unremarkable in appearance.  No acute osseous abnormalities are seen.  Review of the MIP images confirms the above findings.  CTA ABDOMEN AND PELVIS FINDINGS  There is no evidence of aortic dissection. Scattered calcific atherosclerotic disease is noted along the abdominal aorta and its branches. The celiac trunk, superior mesenteric artery, bilateral renal arteries and inferior mesenteric artery remain patent. Calcification is most prominent at the origin of the superior mesenteric artery, with perhaps mild associated luminal narrowing.  The liver and spleen are unremarkable in appearance. Several large stones are seen within the gallbladder; the gallbladder is otherwise unremarkable in appearance. The pancreas and adrenal glands are  unremarkable.  The kidneys are unremarkable in appearance. There is no evidence of hydronephrosis. No renal or ureteral stones are seen. No perinephric stranding is appreciated.  No free fluid is identified. The small bowel is unremarkable in appearance. The stomach is within normal limits. No acute vascular abnormalities are seen.  The appendix is not definitely seen; there is no evidence for appendicitis. Scattered diverticulosis is noted along the distal descending and sigmoid colon, without evidence of diverticulitis.  The bladder is mildly distended and grossly unremarkable in appearance. The uterus is within normal limits. The ovaries are relatively symmetric; no suspicious adnexal masses are seen. No inguinal lymphadenopathy is seen.  No acute osseous abnormalities are identified.  Review of the MIP images confirms the above findings.  IMPRESSION: 1. No evidence of aortic dissection. Mild calcific atherosclerotic disease more prominent within the abdomen. Calcification is most prominent at the origin of the superior mesenteric artery, with perhaps mild associated luminal narrowing. 2. No central pulmonary embolus seen, though evaluation for pulmonary embolus is limited given the phase of contrast enhancement. 3. Mild bilateral dependent subsegmental atelectasis noted; lungs otherwise clear. 4. Cholelithiasis; gallbladder otherwise unremarkable in appearance. 5. Scattered diverticulosis along the distal descending and sigmoid colon, without evidence of diverticulitis.   Electronically Signed   By: Roanna Raider M.D.   On: 10/09/2013 23:27   Portable Chest Xray  10/10/2013   CLINICAL DATA:  Assess endotracheal tube placement.  EXAM: PORTABLE CHEST - 1 VIEW  COMPARISON:  Chest radiograph performed 01/27/2013, and CTA of the chest performed 10/09/2013  FINDINGS: The patient's endotracheal tube is seen ending 4 cm above the carina. Enteric tube is noted extending below the diaphragm.  There is new diffuse  left-sided airspace opacification; this raises concern for asymmetric flash pulmonary edema. More mild right perihilar airspace opacity is seen. No pleural effusion or pneumothorax is identified.  The cardiomediastinal silhouette is borderline normal in size. No acute osseous abnormalities are seen. External pacing pads are noted.  IMPRESSION: 1. Endotracheal tube seen ending 4 cm above the carina. 2. New diffuse left-sided airspace opacification raises concern for asymmetric flash pulmonary edema. More mild right perihilar airspace opacity also seen.   Electronically Signed   By: Roanna Raider M.D.   On: 10/10/2013 05:04     CXR: ETT in position, new left sided ASD - edema vs pa  ASSESSMENT / PLAN:  PULMONARY A:Acute respiratory failure - due to cardiogenic shock, pulm edema vs aspiration, favor edema due to bloody secretions P:   PRVC Vent bundle   CARDIOVASCULAR A: AWMI s/p PTCA to LAD Cardiogenic shock AIVR - s/o revascularisation P:  Taper levophed to off Keep low dose dopamine due to brady rhythm-  AIVR Lasix once off levo Rest per cards - tirofiban etc  RENAL A:  High risk AKI - contrast for CT & angio Metab acidosis, likely lactate P:   Follow Hydrate chk lactate - expect to resolve as hemodynamics improve  GASTROINTESTINAL A:  No issues P:   Protonix for SUP  HEMATOLOGIC A:  Rt femoral hematoma with CVL attempt P:  Follow CBC -expect Hb to drop  INFECTIOUS A:  Doubt aspiration pna - favor asymm edema P:   Hold abx, low threshold to start  ENDOCRINE A:  Hyperglycemia, steroid induced -given for contrast allergy   P:   SSI -mod scale, ca increase to resistant  NEUROLOGIC A:  No issues P:   Fent gtt Add versed gtt only if required  TODAY'S SUMMARY: Cardiogenic shock & pulmonary edema s/p PTCA to LAD for AWMI- keep intubated x 24h until off pressors, d/w cards  I have personally obtained a history, examined the patient, evaluated laboratory and  imaging results, formulated the assessment and plan and placed orders. CRITICAL CARE: The patient is critically ill with multiple organ systems failure and requires high complexity decision making for assessment and support, frequent evaluation and titration of therapies, application of advanced monitoring technologies and extensive interpretation of multiple databases. Critical Care Time devoted to patient care services described in this note is 60 minutes.   Oretha Milch  Pulmonary and Critical Care Medicine [facility] Pager: [phone]  10/10/2013, 5:07 AM

## 2013-10-10 NOTE — Care Management Note (Addendum)
    Page 1 of 1   10/10/2013     1:00:28 PM   CARE MANAGEMENT NOTE 10/10/2013  Patient:  Melinda Noble, Melinda Noble   Account Number:  [id number]  Date Initiated:  10/10/2013  Documentation initiated by:  Junius Creamer  Subjective/Objective Assessment:   adm w mi, vent     Action/Plan:   lives w husband, pcp dr Molly Maduro gates   Anticipated DC Date:     Anticipated DC Plan:  HOME/SELF CARE      DC Planning Services  CM consult  Medication Assistance      Choice offered to / List presented to:             Status of service:   Medicare Important Message given?   (If response is "NO", the following Medicare IM given date fields will be blank) Date Medicare IM given:   Date Additional Medicare IM given:    Discharge Disposition:  HOME/SELF CARE  Per UR Regulation:  Reviewed for med. necessity/level of care/duration of stay  If discussed at Long Length of Stay Meetings, dates discussed:    Comments:  12/23 0925 debbie Geana Walts rn,bsn left 30day free brilinta card w per cm sec to med coverage til -10-19-13. has 30day free card to start.

## 2013-10-10 NOTE — Progress Notes (Signed)
Met pt's husband in ed and escorted him to 2nd floor lobby. Per pt's husband's request, called their [place] at [facility]. Pt's priest, two sons and daughter arrived while pt was in cath lab. Served as liaison btwn dr and family until cath was complete and Dr. [name] updated family and in detail described the cath procedure and pt's condition. Family expressed appreciation for updates during the process and appreciative of Dr. [name]. Marjory Lies Chaplain

## 2013-10-10 NOTE — H&P (Signed)
History and Physical Interval Note:  NAME:  Melinda Noble   MRN: [id number] DOB:  [birth year]   ADMIT DATE: 10/09/2013   10/10/2013 3:15 AM  Melinda Noble is a 79 y.o. female who initially presented to [facility] ER with Severe pain in the back btw the shoulder blades radiating to arms. Had been having Sx most of the day - that "never stopped". Upon initial evaluation --ECG noted to have what appeared to be diffuse J point elevation - more prominent in Anterior Leads - no reciprocal changes.   She was profoundly hypertensive. Troponin checked - was 1.2.  Based upon her presentation / type of pain, there was concern for possible Aortic Dissection -- a CTA of the chest was performed to rule out this Life Threatening condition prior to initiating anticoagulation.  Interestingly, once completed, the repeat ECG showed clear Anterior (~3-4 mm) Anterior STE with reciprocal changes & pain was worse.  Code STEMI called.  She was brought [facility] & up to the cath Lab upon team arrival.    She was in ~8-9/10 CP, but hemodynamically stable.  Past Medical History  Diagnosis Date  . Hypertension   . Migraine headache   . Thyroid disease   . Depression    Past Surgical History  Procedure Laterality Date  . Shoulder surgery      FAMHx: No family history on file.  SOCHx:  reports that she has quit smoking. She does not have any smokeless tobacco history on file. She reports that she does not drink alcohol or use illicit drugs.  ALLERGIES: Allergies  Allergen Reactions  . Iohexol      Code: HIVES, Desc: anaphylactic shock s/p contrast injection many yrs ago--suggested that pt NEVER have iv contrast//a.c., Onset Date: 13244010   . Penicillins Nausea Only  . Sulfa Antibiotics Rash    HOME MEDICATIONS: Prescriptions prior to admission  Medication Sig Dispense Refill  . atenolol (TENORMIN) 50 MG tablet Take 25 mg by mouth 2 (two) times daily.      . Biotin 1000 MCG tablet Take 1,000 mcg by mouth  daily.      . calcium-vitamin D (OSCAL WITH D) 500-200 MG-UNIT per tablet Take 2 tablets by mouth daily.      . cholecalciferol (VITAMIN D) 1000 UNITS tablet Take 1,000 Units by mouth daily.      . clorazepate (TRANXENE) 3.75 MG tablet Take 0.9375 mg by mouth 2 (two) times daily as needed for anxiety.      Marland Kitchen levothyroxine (SYNTHROID, LEVOTHROID) 100 MCG tablet Take 100 mcg by mouth daily before breakfast.      . metoCLOPramide (REGLAN) 10 MG tablet Take 1 tablet (10 mg total) by mouth every 6 (six) hours as needed (Nausea or headache).  30 tablet  0  . Multiple Vitamins-Minerals (MULTIVITAMIN WITH MINERALS) tablet Take 1 tablet by mouth daily.      Marland Kitchen omeprazole (PRILOSEC) 20 MG capsule Take 20 mg by mouth daily.      . sertraline (ZOLOFT) 50 MG tablet Take 50 mg by mouth daily.      . traMADol (ULTRAM) 50 MG tablet Take 1 tablet (50 mg total) by mouth every 6 (six) hours as needed for pain.  15 tablet  0  . vitamin E (VITAMIN E) 400 UNIT capsule Take 400 Units by mouth daily.        PHYSICAL EXAM:Blood pressure 167/59, pulse 68, temperature 98.1 F (36.7 C), temperature source Oral, resp. rate 18, height 5'  6" (1.676 m), weight 210 lb (95.255 kg), SpO2 91.00%. See Full H&P  IMPRESSION & PLAN The patients' history has been reviewed, patient examined, no change in status from most recent note, stable for surgery. I have reviewed the patients' chart and labs. Questions were answered to the patient's satisfaction.    Melinda Noble has presented today for surgery, with the diagnosis of ANTERIOR STEMI: The various methods of treatment have been discussed with the patient and family.   Risks / Complications include, but not limited to: Death, MI, CVA/TIA, VF/VT (with defibrillation), Bradycardia (need for temporary pacer placement), contrast induced nephropathy, bleeding / bruising / hematoma / pseudoaneurysm, vascular or coronary injury (with possible emergent CT or Vascular Surgery), adverse  medication reactions, infection.     After consideration of risks, benefits and other options for treatment, the patient has given verbal (Emergency Consent) FOR Procedure(s): EMERGENT.  LEFT HEART CATHETERIZATION AND CORONARY ANGIOGRAPHY +/- AD HOC PERCUTANEOUS CORONARY INTERVENTION   as a surgical intervention.   We will proceed with the planned procedure.   HARDING,DAVID W [facility] [address] [place], [place]  [address]  [phone]  10/10/2013 3:15 AM

## 2013-10-10 NOTE — Progress Notes (Signed)
Indwelling foley catheter inserted using sterile procedure after a bedside huddle, per physician orders. Urine sample sent for UA directly after insertion. Melinda Noble

## 2013-10-11 ENCOUNTER — Inpatient Hospital Stay (HOSPITAL_COMMUNITY): Payer: Medicare Other

## 2013-10-11 ENCOUNTER — Other Ambulatory Visit: Payer: Medicare Other

## 2013-10-11 DIAGNOSIS — J811 Chronic pulmonary edema: Secondary | ICD-10-CM | POA: Diagnosis present

## 2013-10-11 DIAGNOSIS — R918 Other nonspecific abnormal finding of lung field: Secondary | ICD-10-CM | POA: Diagnosis not present

## 2013-10-11 DIAGNOSIS — I501 Left ventricular failure: Secondary | ICD-10-CM

## 2013-10-11 DIAGNOSIS — I2109 ST elevation (STEMI) myocardial infarction involving other coronary artery of anterior wall: Secondary | ICD-10-CM | POA: Diagnosis not present

## 2013-10-11 DIAGNOSIS — I219 Acute myocardial infarction, unspecified: Secondary | ICD-10-CM | POA: Diagnosis not present

## 2013-10-11 DIAGNOSIS — I2542 Coronary artery dissection: Secondary | ICD-10-CM | POA: Diagnosis not present

## 2013-10-11 DIAGNOSIS — R570 Cardiogenic shock: Secondary | ICD-10-CM | POA: Diagnosis not present

## 2013-10-11 DIAGNOSIS — J9600 Acute respiratory failure, unspecified whether with hypoxia or hypercapnia: Secondary | ICD-10-CM | POA: Diagnosis not present

## 2013-10-11 DIAGNOSIS — E872 Acidosis: Secondary | ICD-10-CM | POA: Diagnosis not present

## 2013-10-11 LAB — CBC WITH DIFFERENTIAL/PLATELET
Basophils Absolute: 0 10*3/uL (ref 0.0–0.1)
Basophils Relative: 0 % (ref 0–1)
Eosinophils Absolute: 0 10*3/uL (ref 0.0–0.7)
Eosinophils Relative: 0 % (ref 0–5)
HCT: 32.7 % — ABNORMAL LOW (ref 36.0–46.0)
MCH: 30.4 pg (ref 26.0–34.0)
MCHC: 32.4 g/dL (ref 30.0–36.0)
MCV: 93.7 fL (ref 78.0–100.0)
Monocytes Absolute: 1.9 10*3/uL — ABNORMAL HIGH (ref 0.1–1.0)
Platelets: 216 10*3/uL (ref 150–400)
RBC: 3.49 MIL/uL — ABNORMAL LOW (ref 3.87–5.11)
RDW: 14.9 % (ref 11.5–15.5)

## 2013-10-11 LAB — COMPREHENSIVE METABOLIC PANEL
ALT: 43 U/L — ABNORMAL HIGH (ref 0–35)
AST: 202 U/L — ABNORMAL HIGH (ref 0–37)
Albumin: 2.9 g/dL — ABNORMAL LOW (ref 3.5–5.2)
Alkaline Phosphatase: 69 U/L (ref 39–117)
BUN: 23 mg/dL (ref 6–23)
Chloride: 111 mEq/L (ref 96–112)
Glucose, Bld: 130 mg/dL — ABNORMAL HIGH (ref 70–99)
Potassium: 3.8 mEq/L (ref 3.5–5.1)
Sodium: 141 mEq/L (ref 135–145)
Total Bilirubin: 0.5 mg/dL (ref 0.3–1.2)
Total Protein: 5.5 g/dL — ABNORMAL LOW (ref 6.0–8.3)

## 2013-10-11 LAB — POCT I-STAT 3, ART BLOOD GAS (G3+)
Bicarbonate: 23.9 mEq/L (ref 20.0–24.0)
TCO2: 25 mmol/L (ref 0–100)
pCO2 arterial: 38.9 mmHg (ref 35.0–45.0)
pH, Arterial: 7.398 (ref 7.350–7.450)

## 2013-10-11 LAB — TROPONIN I: Troponin I: >20 ng/mL (ref <0.30)

## 2013-10-11 LAB — PRO B NATRIURETIC PEPTIDE: Pro B Natriuretic peptide (BNP): 2172 pg/mL — ABNORMAL HIGH (ref 0–450)

## 2013-10-11 LAB — GLUCOSE, CAPILLARY: Glucose-Capillary: 125 mg/dL — ABNORMAL HIGH (ref 70–99)

## 2013-10-11 LAB — POCT ACTIVATED CLOTTING TIME: Activated Clotting Time: 110 seconds

## 2013-10-11 MED ORDER — FENTANYL CITRATE 0.05 MG/ML IJ SOLN
12.5000 ug | INTRAMUSCULAR | Status: DC | PRN
  Administered 2013-10-12 (×2): 12.5 ug via INTRAVENOUS
  Administered 2013-10-12 – 2013-10-14 (×2): 25 ug via INTRAVENOUS
  Filled 2013-10-11 (×4): qty 2

## 2013-10-11 MED ORDER — AMIODARONE HCL IN DEXTROSE 360-4.14 MG/200ML-% IV SOLN
30.0000 mg/h | INTRAVENOUS | Status: DC
  Administered 2013-10-12 – 2013-10-13 (×2): 30 mg/h via INTRAVENOUS
  Filled 2013-10-11 (×7): qty 200

## 2013-10-11 MED ORDER — ATORVASTATIN CALCIUM 80 MG PO TABS
80.0000 mg | ORAL_TABLET | Freq: Every day | ORAL | Status: DC
  Administered 2013-10-12 – 2013-10-16 (×5): 80 mg via ORAL
  Filled 2013-10-11 (×6): qty 1

## 2013-10-11 MED ORDER — AMIODARONE HCL IN DEXTROSE 360-4.14 MG/200ML-% IV SOLN
60.0000 mg/h | INTRAVENOUS | Status: AC
  Administered 2013-10-11 (×2): 60 mg/h via INTRAVENOUS
  Filled 2013-10-11: qty 200

## 2013-10-11 MED ORDER — NOREPINEPHRINE BITARTRATE 1 MG/ML IJ SOLN
2.0000 ug/min | INTRAMUSCULAR | Status: DC
  Filled 2013-10-11: qty 16

## 2013-10-11 MED ORDER — AMIODARONE HCL IN DEXTROSE 360-4.14 MG/200ML-% IV SOLN
INTRAVENOUS | Status: AC
  Administered 2013-10-11: 60 mg/h via INTRAVENOUS
  Filled 2013-10-11: qty 200

## 2013-10-11 MED ORDER — AMIODARONE LOAD VIA INFUSION
150.0000 mg | Freq: Once | INTRAVENOUS | Status: AC
  Administered 2013-10-11: 150 mg via INTRAVENOUS
  Filled 2013-10-11: qty 83.34

## 2013-10-11 MED ORDER — BIOTENE DRY MOUTH MT LIQD
15.0000 mL | Freq: Two times a day (BID) | OROMUCOSAL | Status: DC
  Administered 2013-10-11 – 2013-10-17 (×10): 15 mL via OROMUCOSAL

## 2013-10-11 MED ORDER — SPIRONOLACTONE 12.5 MG HALF TABLET
12.5000 mg | ORAL_TABLET | Freq: Once | ORAL | Status: AC
  Administered 2013-10-11: 12.5 mg via ORAL
  Filled 2013-10-11: qty 1

## 2013-10-11 MED ORDER — ATROPINE SULFATE 0.1 MG/ML IJ SOLN
INTRAMUSCULAR | Status: AC
  Filled 2013-10-11: qty 10

## 2013-10-11 NOTE — Significant Event (Signed)
Pt has passed SBT and notes indicate plan for extubation after sheaths removed. Extubation protocol ordered. Will monitor post extubation   Billy Fischer, MD ; [facility] [phone].  After 5:30 PM or weekends, call [phone]

## 2013-10-11 NOTE — Progress Notes (Signed)
R femoral arterial and venous sheaths removed; pressure held for 25 minutes on arterial sheath starting at 1436; pressure held for 10 minutes on venous sheath starting at 1450; site a level 1 (bruising, marked) at beginning of sheath hold; site level 1 (same bruising) at end of holding; pt educated on holding pressure when coughing; R pedal pulse +1 before and after sheath hold; pt's vital signs remained stable throughout sheath hold; atropine at bedside, but did not have to use; 2nd RN at bedside as well for sheath pull Shearon Balo, RN

## 2013-10-11 NOTE — Progress Notes (Addendum)
225cc of fentanyl wasted in sink, witnessed by 2 RNs; Shearon Balo, RN Carlton Adam, RN

## 2013-10-11 NOTE — Procedures (Signed)
Extubation Procedure Note  Patient Details:   Name: Melinda Noble DOB: 1930-08-31 MRN: [id number]   Airway Documentation:     Evaluation  O2 sats: stable throughout Complications: No apparent complications Patient did tolerate procedure well. Bilateral Breath Sounds: Rhonchi Suctioning: Airway Yes  Pt is stable and placed on 4L [place].   Devra Dopp D 10/11/2013, 4:22 PM

## 2013-10-11 NOTE — Clinical Documentation Improvement (Signed)
THIS DOCUMENT IS NOT A PERMANENT PART OF THE MEDICAL RECORD  Please update your documentation with the medical record to reflect your response to this query. If you need help knowing how to do this please call [phone].  10/11/13   Dear Dr. [name],  Per Notes patient developed "pulmonary edema" associated with Acute respiratory failure and cardiogenic shock. Please indicate in Notes and DC summary if this is "Acute pulmonary edema" to illustrate the severity of illness and risk of mortality of this patient. Thank you.  You may use possible, probable, or suspect with inpatient documentation. possible, probable, suspected diagnoses MUST be documented at the time of discharge  Reviewed: additional documentation in the medical record  Thank You,  Beverley Fiedler  Clinical Documentation Specialist: [phone] Health Information Management [place]

## 2013-10-11 NOTE — Progress Notes (Signed)
ADMISSION DATE:  10/09/2013 CONSULTATION DATE:  10/11/2013  REFERRING MD :  [name] PRIMARY SERVICE: Cards  CHIEF COMPLAINT:  Chest pain  BRIEF PATIENT DESCRIPTION: 82/F with acute AWMI, CT angio neg for dissection, underwent PTCA to LAD , course complicated by resp failure due to pulmonary edema & cardiogenic shock.  SIGNIFICANT EVENTS / STUDIES:  CT angio 12/22 neg dissection  LINES / TUBES: ETT 12/23 >> Rt fem CVL 12/23 (cards) >>  CULTURES: resp 12/23 >>  ANTIBIOTICS: Cefazolin 12/23 >>off    SUBJECTIVE:  Looks good  VITAL SIGNS: Temp:  [98.8 F (37.1 C)-99.4 F (37.4 C)] 98.9 F (37.2 C) (12/24 0800) Pulse Rate:  [56-109] 90 (12/24 0952) Resp:  [12-25] 19 (12/24 0952) BP: (87-170)/(39-147) 151/62 mmHg (12/24 0952) SpO2:  [95 %-100 %] 99 % (12/24 0952) Arterial Line BP: (103-166)/(34-69) 166/66 mmHg (12/24 0952) FiO2 (%):  [30 %] 30 % (12/24 0939) HEMODYNAMICS:   VENTILATOR SETTINGS: Vent Mode:  [-] PSV;CPAP FiO2 (%):  [30 %] 30 % Set Rate:  [14 bmp-24 bmp] 14 bmp Vt Set:  [500 mL] 500 mL PEEP:  [5 cmH20] 5 cmH20 Pressure Support:  [5 cmH20-8 cmH20] 5 cmH20 Plateau Pressure:  [15 cmH20-18 cmH20] 18 cmH20 INTAKE / OUTPUT: Intake/Output     12/23 0701 - 12/24 0700 12/24 0701 - 12/25 0700   I.V. (mL/kg) 1563.9 (15.6) 110 (1.1)   NG/GT 50    IV Piggyback     Total Intake(mL/kg) 1613.9 (16.1) 110 (1.1)   Urine (mL/kg/hr) 1140 (0.5) 45 (0.1)   Emesis/NG output 250 (0.1)    Total Output 1390 45   Net +223.9 +65          PHYSICAL EXAMINATION: Gen.No distress on SBT ENT - orally intubated  Neck: No JVD, no thyromegaly, no carotid bruits Lungs: basilar rales  Cardiovascular: Rhythm regular, heart sounds  normal, no murmurs, no peripheral edema Abdomen: soft and non-tender, no hepatosplenomegaly, BS normal. Musculoskeletal: No deformities, no cyanosis or clubbing Neuro:  Seadted, unresponsive Skin:  Warm, no lesions/  rash   LABS:  CBC  Recent Labs Lab 10/10/13 0430 10/10/13 0742 10/10/13 1400 10/11/13 0500  WBC 16.8*  --  21.7* 17.6*  HGB 13.4  --  12.3 10.6*  HCT 40.2  --  36.8 32.7*  PLT 283 275 243 216   Coag's  Recent Labs Lab 10/09/13 2225  APTT 33  INR 0.94   BMET  Recent Labs Lab 10/09/13 2220 10/10/13 0430 10/11/13 0500  NA 136 137 141  K 3.8 4.0 3.8  CL 102 104 111  CO2 22 20 21   BUN 26* 22 23  CREATININE 0.87 0.70 0.79  GLUCOSE 147* 345* 130*   Electrolytes  Recent Labs Lab 10/09/13 2220 10/10/13 0430 10/11/13 0500  CALCIUM 10.3 8.7 9.3  MG  --  1.7  --    Sepsis Markers No results found for this basename: LATICACIDVEN, PROCALCITON, O2SATVEN,  in the last 168 hours ABG  Recent Labs Lab 10/10/13 0234 10/10/13 0355 10/10/13 0642  PHART 7.241* 7.236* 7.406  PCO2ART 41.9 44.2 28.1*  PO2ART 89.0 127.0* 124.0*   Liver Enzymes  Recent Labs Lab 10/11/13 0500  AST 202*  ALT 43*  ALKPHOS 69  BILITOT 0.5  ALBUMIN 2.9*   Cardiac Enzymes  Recent Labs Lab 10/10/13 0800 10/10/13 1530 10/11/13 0500  TROPONINI >20.00* >20.00* >20.00*  PROBNP  --   --  2172.0*   Glucose  Recent Labs Lab 10/10/13 1127 10/10/13 1509  10/10/13 2037 10/10/13 2349 10/11/13 0500 10/11/13 0834  GLUCAP 170* 142* 127* 142* 125* 121*    Imaging Ct Angio Chest W/cm &/or Wo Cm  10/09/2013   CLINICAL DATA:  Chest and abdominal pain.  Assess for dissection.  EXAM: CT ANGIOGRAPHY CHEST, ABDOMEN AND PELVIS  TECHNIQUE: Multidetector CT imaging through the chest, abdomen and pelvis was performed using the standard protocol during bolus administration of intravenous contrast. Multiplanar reconstructed images including MIPs were obtained and reviewed to evaluate the vascular anatomy.  CONTRAST:  OMNIPAQUE IOHEXOL 350 MG/ML SOLN  COMPARISON:  None.  FINDINGS: CTA CHEST FINDINGS  There is no evidence of aortic dissection. Minimal calcific atherosclerotic disease is noted  along the aortic arch and descending thoracic aorta, with minimal thrombus along the descending thoracic aorta. There is no evidence of aneurysmal dilatation. Aside from mild calcification along the proximal great vessels, the great vessels are grossly unremarkable in appearance.  Evaluation for pulmonary embolus is markedly limited given the phase of contrast enhancement; no central pulmonary embolus is seen.  Mild bilateral dependent subsegmental atelectasis is noted, most prominent along the medial aspect of the right lung. This appears to reflect underlying osteophytes. There is no evidence of pleural effusion or pneumothorax. No masses are identified; no abnormal focal contrast enhancement is seen.  The mediastinum is unremarkable in appearance. No mediastinal lymphadenopathy is seen. No pericardial effusion is identified. No axillary lymphadenopathy is seen. The diminutive thyroid gland is unremarkable in appearance.  No acute osseous abnormalities are seen.  Review of the MIP images confirms the above findings.  CTA ABDOMEN AND PELVIS FINDINGS  There is no evidence of aortic dissection. Scattered calcific atherosclerotic disease is noted along the abdominal aorta and its branches. The celiac trunk, superior mesenteric artery, bilateral renal arteries and inferior mesenteric artery remain patent. Calcification is most prominent at the origin of the superior mesenteric artery, with perhaps mild associated luminal narrowing.  The liver and spleen are unremarkable in appearance. Several large stones are seen within the gallbladder; the gallbladder is otherwise unremarkable in appearance. The pancreas and adrenal glands are unremarkable.  The kidneys are unremarkable in appearance. There is no evidence of hydronephrosis. No renal or ureteral stones are seen. No perinephric stranding is appreciated.  No free fluid is identified. The small bowel is unremarkable in appearance. The stomach is within normal limits. No  acute vascular abnormalities are seen.  The appendix is not definitely seen; there is no evidence for appendicitis. Scattered diverticulosis is noted along the distal descending and sigmoid colon, without evidence of diverticulitis.  The bladder is mildly distended and grossly unremarkable in appearance. The uterus is within normal limits. The ovaries are relatively symmetric; no suspicious adnexal masses are seen. No inguinal lymphadenopathy is seen.  No acute osseous abnormalities are identified.  Review of the MIP images confirms the above findings.  IMPRESSION: 1. No evidence of aortic dissection. Mild calcific atherosclerotic disease more prominent within the abdomen. Calcification is most prominent at the origin of the superior mesenteric artery, with perhaps mild associated luminal narrowing. 2. No central pulmonary embolus seen, though evaluation for pulmonary embolus is limited given the phase of contrast enhancement. 3. Mild bilateral dependent subsegmental atelectasis noted; lungs otherwise clear. 4. Cholelithiasis; gallbladder otherwise unremarkable in appearance. 5. Scattered diverticulosis along the distal descending and sigmoid colon, without evidence of diverticulitis.   Electronically Signed   By: Roanna Raider M.D.   On: 10/09/2013 23:27   Ct Angio Abdomen W/cm &/or Wo  Contrast  10/09/2013   CLINICAL DATA:  Chest and abdominal pain.  Assess for dissection.  EXAM: CT ANGIOGRAPHY CHEST, ABDOMEN AND PELVIS  TECHNIQUE: Multidetector CT imaging through the chest, abdomen and pelvis was performed using the standard protocol during bolus administration of intravenous contrast. Multiplanar reconstructed images including MIPs were obtained and reviewed to evaluate the vascular anatomy.  CONTRAST:  OMNIPAQUE IOHEXOL 350 MG/ML SOLN  COMPARISON:  None.  FINDINGS: CTA CHEST FINDINGS  There is no evidence of aortic dissection. Minimal calcific atherosclerotic disease is noted along the aortic arch and  descending thoracic aorta, with minimal thrombus along the descending thoracic aorta. There is no evidence of aneurysmal dilatation. Aside from mild calcification along the proximal great vessels, the great vessels are grossly unremarkable in appearance.  Evaluation for pulmonary embolus is markedly limited given the phase of contrast enhancement; no central pulmonary embolus is seen.  Mild bilateral dependent subsegmental atelectasis is noted, most prominent along the medial aspect of the right lung. This appears to reflect underlying osteophytes. There is no evidence of pleural effusion or pneumothorax. No masses are identified; no abnormal focal contrast enhancement is seen.  The mediastinum is unremarkable in appearance. No mediastinal lymphadenopathy is seen. No pericardial effusion is identified. No axillary lymphadenopathy is seen. The diminutive thyroid gland is unremarkable in appearance.  No acute osseous abnormalities are seen.  Review of the MIP images confirms the above findings.  CTA ABDOMEN AND PELVIS FINDINGS  There is no evidence of aortic dissection. Scattered calcific atherosclerotic disease is noted along the abdominal aorta and its branches. The celiac trunk, superior mesenteric artery, bilateral renal arteries and inferior mesenteric artery remain patent. Calcification is most prominent at the origin of the superior mesenteric artery, with perhaps mild associated luminal narrowing.  The liver and spleen are unremarkable in appearance. Several large stones are seen within the gallbladder; the gallbladder is otherwise unremarkable in appearance. The pancreas and adrenal glands are unremarkable.  The kidneys are unremarkable in appearance. There is no evidence of hydronephrosis. No renal or ureteral stones are seen. No perinephric stranding is appreciated.  No free fluid is identified. The small bowel is unremarkable in appearance. The stomach is within normal limits. No acute vascular  abnormalities are seen.  The appendix is not definitely seen; there is no evidence for appendicitis. Scattered diverticulosis is noted along the distal descending and sigmoid colon, without evidence of diverticulitis.  The bladder is mildly distended and grossly unremarkable in appearance. The uterus is within normal limits. The ovaries are relatively symmetric; no suspicious adnexal masses are seen. No inguinal lymphadenopathy is seen.  No acute osseous abnormalities are identified.  Review of the MIP images confirms the above findings.  IMPRESSION: 1. No evidence of aortic dissection. Mild calcific atherosclerotic disease more prominent within the abdomen. Calcification is most prominent at the origin of the superior mesenteric artery, with perhaps mild associated luminal narrowing. 2. No central pulmonary embolus seen, though evaluation for pulmonary embolus is limited given the phase of contrast enhancement. 3. Mild bilateral dependent subsegmental atelectasis noted; lungs otherwise clear. 4. Cholelithiasis; gallbladder otherwise unremarkable in appearance. 5. Scattered diverticulosis along the distal descending and sigmoid colon, without evidence of diverticulitis.   Electronically Signed   By: Roanna Raider M.D.   On: 10/09/2013 23:27   Dg Chest Port 1 View  10/11/2013   CLINICAL DATA:  Pulmonary edema .  EXAM: PORTABLE CHEST - 1 VIEW  COMPARISON:  10/10/2013.  FINDINGS: Endotracheal tube, and NG  tube in stable anatomic position. Defibrillator pads are noted. Previously identified severe diffuse left lung infiltrate has partially cleared. Mild infiltrate present in the right lung base. These findings suggest underlying pulmonary edema with significant clearing of the left lung from prior exam. No pleural effusion or pneumothorax.  IMPRESSION: 1.  Stable line and tube positions.  2. Significant interim clearing of diffuse left lung pulmonary infiltrate suggesting clearing of asymmetric pulmonary edema.  Mild bibasilar infiltrates are present.   Electronically Signed   By: Maisie Fus  Register   On: 10/11/2013 07:28   Portable Chest Xray  10/10/2013   CLINICAL DATA:  Assess endotracheal tube placement.  EXAM: PORTABLE CHEST - 1 VIEW  COMPARISON:  Chest radiograph performed 01/27/2013, and CTA of the chest performed 10/09/2013  FINDINGS: The patient's endotracheal tube is seen ending 4 cm above the carina. Enteric tube is noted extending below the diaphragm.  There is new diffuse left-sided airspace opacification; this raises concern for asymmetric flash pulmonary edema. More mild right perihilar airspace opacity is seen. No pleural effusion or pneumothorax is identified.  The cardiomediastinal silhouette is borderline normal in size. No acute osseous abnormalities are seen. External pacing pads are noted.  IMPRESSION: 1. Endotracheal tube seen ending 4 cm above the carina. 2. New diffuse left-sided airspace opacification raises concern for asymmetric flash pulmonary edema. More mild right perihilar airspace opacity also seen.   Electronically Signed   By: Roanna Raider M.D.   On: 10/10/2013 05:04   Dg Abd Portable 1v  10/10/2013   CLINICAL DATA:  Orogastric tube placement.  EXAM: PORTABLE ABDOMEN - 1 VIEW  COMPARISON:  CTA of the abdomen performed 10/09/2013  FINDINGS: The patient's enteric tube is noted extending across the expected location of the stomach and likely to the duodenum, though its tip is not visualized. The visualized bowel gas pattern is grossly unremarkable.  Diffuse left-sided airspace opacification is better characterized on concurrent chest radiograph. External pacing pads are noted. No acute osseous abnormalities are seen.  IMPRESSION: 1. Enteric tube noted extending across the stomach and likely to the duodenum, though its tip is not visualized. Unremarkable bowel gas pattern. 2. Diffuse left-sided airspace opacification, better characterized on concurrent chest radiograph.    Electronically Signed   By: Roanna Raider M.D.   On: 10/10/2013 05:08    CXR: ETT in position, new left sided ASD - edema vs pna, marked improvement   ASSESSMENT / PLAN:  PULMONARY A:Acute respiratory failure - due to cardiogenic shock, pulm edema vs aspiration, favor edema  CXR improved/ excellent SBT parameters  P:   Wean w/ plan to extubate after activity restrictions expired from removing sheath Weaning this am with excellent MV, TV, rate, rsbi, abg Did pass concerns myocardial O2 demand concerns after extubation and vent off to family Supportive care F/u cxr in am  She has excellent neurostatus for extubation  CARDIOVASCULAR A:  AWMI s/p PTCA to LAD Cardiogenic shock-->improved  AIVR - s/o revascularisation P:  Taper levophed to off , MAP goal 60 Rest per cards - tirofiban etc  RENAL A:   High risk AKI - contrast for CT & angio Metab acidosis, likely lactate P:   Follow Hydrate chk lactate - expect to resolve as hemodynamics improve  GASTROINTESTINAL A:  No issues P:   Protonix for SUP If not extubated, start TF  HEMATOLOGIC A:   Rt femoral hematoma with CVL attempt P:  Follow CBC -expect Hb to drop  INFECTIOUS A:  Doubt aspiration  pna - favor asymm edema P:   Hold abx, low threshold to start pcxr resolving  ENDOCRINE A:   Hyperglycemia, steroid induced -given for contrast allergy   P:   SSI -mod scale, ca increase to resistant  NEUROLOGIC A:  No issues P:   D/c sedation, PRN   TODAY'S SUMMARY: Cardiogenic shock  (resolving) & pulmonary edema s/p PTCA to LAD for AWMI- Should be able to come off vent later today. Updated great family  I have personally obtained a history, examined the patient, evaluated laboratory and imaging results, formulated the assessment and plan and placed orders. CRITICAL CARE: The patient is critically ill with multiple organ systems failure and requires high complexity decision making for assessment and support,  frequent evaluation and titration of therapies, application of advanced monitoring technologies and extensive interpretation of multiple databases. Critical Care Time devoted to patient care services described in this note is 30 minutes.   BABCOCK,PETE    10/11/2013, 10:15 AM  Mcarthur Rossetti. Tyson Alias, MD, FACP Pgr: [phone] [facility]

## 2013-10-11 NOTE — Progress Notes (Signed)
Pt c/o of L sided jaw & ear pain; MD made aware, STAT EKG ordered; no new changes - MD aware; pain resolved quickly; will continue to monitor closely and update as needed

## 2013-10-11 NOTE — Progress Notes (Signed)
PULMONARY / CRITICAL CARE MEDICINE  ADMISSION DATE:  10/09/2013 CONSULTATION DATE:  10/12/2013  REFERRING MD :  Cardiology PRIMARY SERVICE: Cardiology  CHIEF COMPLAINT:  Acute respiratory failure  BRIEF PATIENT DESCRIPTION: 79 yo with AMI s/p PTCA / stents to LAD who developed cardiogenic shock, pulmonary edema and acute respiratory failure.  SIGNIFICANT EVENTS / STUDIES:  12/22  CTA chest >>>  no dissection  LINES / TUBES: ETT 12/23 >>> 12/24 R fem CVL 12/23 >>> 12/24  CULTURES: 12/23  MRSA >>> neg  ANTIBIOTICS:  INTERVAL HISTORY: Extubated. No respiratory issues overnight. AF/RVR >>> Amiodarone started.  VITAL SIGNS: Temp:  [98.3 F (36.8 C)-98.9 F (37.2 C)] 98.6 F (37 C) (12/25 0400) Pulse Rate:  [51-110] 67 (12/25 0500) Resp:  [12-23] 22 (12/25 0500) BP: (71-151)/(31-122) 101/46 mmHg (12/25 0500) SpO2:  [96 %-100 %] 99 % (12/25 0500) Arterial Line BP: (97-166)/(42-69) 108/45 mmHg (12/24 1400) FiO2 (%):  [30 %] 30 % (12/24 1600) HEMODYNAMICS:   VENTILATOR SETTINGS: Vent Mode:  [-] PSV;CPAP FiO2 (%):  [30 %] 30 % PEEP:  [5 cmH20] 5 cmH20 Pressure Support:  [5 cmH20-8 cmH20] 8 cmH20 INTAKE / OUTPUT: Intake/Output     12/24 0701 - 12/25 0700   I.V. (mL/kg) 1081.4 (10.8)   Total Intake(mL/kg) 1081.4 (10.8)   Urine (mL/kg/hr) 295 (0.1)   Total Output 295   Net +786.4         PHYSICAL EXAMINATION: General:  Appears acutely ill, mechanically ventilated, synchronous Neuro:  Encephalopathic, nonfocal, cough / gag diminished HEENT:  PERRL, OETT / OGT Cardiovascular:  RRR, no m/r/g Lungs:  Bilateral diminished air entry, no w/r/r Abdomen:  Soft, nontender, bowel sounds diminished Musculoskeletal:  Moves all extremities, no edema Skin:  Intact  LABS:  CBC  Recent Labs Lab 10/10/13 0430 10/10/13 0742 10/10/13 1400 10/11/13 0500  WBC 16.8*  --  21.7* 17.6*  HGB 13.4  --  12.3 10.6*  HCT 40.2  --  36.8 32.7*  PLT 283 275 243 216   Coag's  Recent  Labs Lab 10/09/13 2225  APTT 33  INR 0.94   BMET  Recent Labs Lab 10/09/13 2220 10/10/13 0430 10/11/13 0500  NA 136 137 141  K 3.8 4.0 3.8  CL 102 104 111  CO2 22 20 21   BUN 26* 22 23  CREATININE 0.87 0.70 0.79  GLUCOSE 147* 345* 130*   Electrolytes  Recent Labs Lab 10/09/13 2220 10/10/13 0430 10/11/13 0500  CALCIUM 10.3 8.7 9.3  MG  --  1.7  --    Sepsis Markers No results found for this basename: LATICACIDVEN, PROCALCITON, O2SATVEN,  in the last 168 hours ABG  Recent Labs Lab 10/10/13 0355 10/10/13 0642 10/11/13 1118  PHART 7.236* 7.406 7.398  PCO2ART 44.2 28.1* 38.9  PO2ART 127.0* 124.0* 111.0*   Liver Enzymes  Recent Labs Lab 10/11/13 0500  AST 202*  ALT 43*  ALKPHOS 69  BILITOT 0.5  ALBUMIN 2.9*   Cardiac Enzymes  Recent Labs Lab 10/10/13 0800 10/10/13 1530 10/11/13 0500  TROPONINI >20.00* >20.00* >20.00*  PROBNP  --   --  2172.0*   Glucose  Recent Labs Lab 10/10/13 1509 10/10/13 2037 10/10/13 2349 10/11/13 0500 10/11/13 0834 10/11/13 1116  GLUCAP 142* 127* 142* 125* 121* 108*   CXR:   ASSESSMENT / PLAN:  PULMONARY A:  Acute respiratory failure in setting of acute pulmonary edema. P:   Goal SpO2>92 Supplemental oxygen PRN  CARDIOVASCULAR A: NSTEMI s/p PTCA to LAD.  Cardiogenic  shock - resolving. P:  Cardiology following  ASA, Brilinta, Lipitor Amiodarone gtt Levophed gtt @2 , titrate to off  RENAL A:  No active issues. P:   Trend BMP NS@KVO   GASTROINTESTINAL A:  No active issues. P:   GI Px is not indicated Advance diet  HEMATOLOGIC A:  R femoral hematoma with CVL attempt. P:  Trend CBC SCDs for DVT Px  INFECTIOUS A:  No overt infection. P:   Defer antibiotics  ENDOCRINE A:  Hyperglycemia, steroid induced - given for contrast allergy. Hypothyroidism. P:   SSI Synthroid  NEUROLOGIC A:  No active issues. P:   No intervention required.  PCCM will sign off. Please re consult if  necessary.  I have personally obtained history, examined patient, evaluated and interpreted laboratory and imaging results, reviewed medical records, formulated assessment / plan and placed orders.  Lonia Farber, MD Pulmonary and Critical Care Medicine [facility] Pager: [phone]  10/12/2013, 6:05 AM

## 2013-10-11 NOTE — Progress Notes (Signed)
20cc of versed wasted in sink; witnessed by 2 RNs; Shearon Balo, RN

## 2013-10-11 NOTE — Progress Notes (Addendum)
Patient ID: Melinda Noble, female   DOB: 31-Oct-1929, 79 y.o.   MRN: [id number]  Subjective: Intubated, writing notes, alert and oriented. Left on Levophed of ~10 overnight.    Objective: Vital signs in last 24 hours: Temp:  [98.8 F (37.1 C)-99.4 F (37.4 C)] 98.9 F (37.2 C) (12/24 0800) Pulse Rate:  [56-109] 90 (12/24 0952) Resp:  [12-25] 19 (12/24 0952) BP: (87-170)/(39-147) 151/62 mmHg (12/24 0952) SpO2:  [95 %-100 %] 99 % (12/24 0952) Arterial Line BP: (103-166)/(34-69) 166/66 mmHg (12/24 0952) FiO2 (%):  [30 %] 30 % (12/24 0939) Weight change:  Last BM Date: 10/09/13 Intake/Output from previous day: +85 12/23 0701 - 12/24 0700 In: 1613.9 [I.V.:1563.9; NG/GT:50] Out: 1390 [Urine:1140; Emesis/NG output:250] Intake/Output this shift: Total I/O In: 110 [I.V.:110] Out: 45 [Urine:45]  PE: General:Pleasant affect, NAD, though anxious, ET tube bothers her.  Skin:Warm and dry, brisk capillary refill, bear hugger in place HEENT:normocephalic, sclera clear, mucus membranes moist Heart:S1S2 RRR without murmur, gallup, rub or click Lungs: without rales, + rhonchi rt base, no wheezes WUJ:WJXB, non tender, + BS, do not palpate liver spleen or masses Ext:no lower ext edema, 2+ pedal pulses rt groin with ecchymosis and line in place Neuro:alert and oriented, MAE, follows commands, + facial symmetry   Lab Results:  Recent Labs  10/10/13 1400 10/11/13 0500  WBC 21.7* 17.6*  HGB 12.3 10.6*  HCT 36.8 32.7*  PLT 243 216   BMET  Recent Labs  10/10/13 0430 10/11/13 0500  NA 137 141  K 4.0 3.8  CL 104 111  CO2 20 21  GLUCOSE 345* 130*  BUN 22 23  CREATININE 0.70 0.79  CALCIUM 8.7 9.3    Recent Labs  10/10/13 1530 10/11/13 0500  TROPONINI >20.00* >20.00*    Lab Results  Component Value Date   CHOL 173 10/10/2013   HDL 46 10/10/2013   LDLCALC 112* 10/10/2013   TRIG 73 10/10/2013   CHOLHDL 3.8 10/10/2013   Lab Results  Component Value Date   HGBA1C 5.8*  10/10/2013     No results found for this basename: TSH    Hepatic Function Panel  Recent Labs  10/11/13 0500  PROT 5.5*  ALBUMIN 2.9*  AST 202*  ALT 43*  ALKPHOS 69  BILITOT 0.5    Recent Labs  10/10/13 0430  CHOL 173   No results found for this basename: PROTIME,  in the last 72 hours  Studies/Results: Imaging studies personally reviewed. - CXR this AM with notable improvement of L Lung infiltrate  Cardiac cath: 100% thrombotic occluded mid LAD associated dissection. It is unclear whether this was a spontaneous dissection versus related to the initial predilation balloon angioplasty. There is presence of intramural thrombus at the initial dissection site.  Successful complex PCI of a long segment of LAD using 2 overlapping Promus Premier DES stents as described incorporating balloon angioplasty, aspiration thrombectomy and intracoronary/IVUS with restoration of TIMI 2 flow after initial no reflow phenomenon.  Suspect that the patient's prolonged chest pain may very well have indicated prolonged LAD occlusion and therefore myocardial stunning leading to no reflow phenomenon after balloon angioplasty, dissection and intramural hematoma Moderate proximal LAD disease and moderate to significant proximal RCA disease in a large dominant RCA  Likely large anterior infarct, will confirm with echocardiogram, associated cardiogenic shock requiring vasopressor therapy  Hypoxic respiratory failure, likely secondary to sedation plus or minus Pulmonary edema requiring emergent intubation by   Medications: I have reviewed the patient's  current medications. Scheduled Meds: . antiseptic oral rinse  15 mL Mouth Rinse QID  . aspirin  81 mg Oral Daily  . atorvastatin  80 mg Per Tube q1800  . chlorhexidine  15 mL Mouth Rinse BID  . insulin aspart  0-15 Units Subcutaneous Q4H  . levothyroxine  100 mcg Oral QAC breakfast  . pantoprazole (PROTONIX) IV  40 mg Intravenous Daily  . sodium  bicarbonate  50 mEq Intravenous Once  . sodium chloride  3 mL Intravenous Q12H  . Ticagrelor  90 mg Oral BID   Continuous Infusions: . DOPamine Stopped (10/10/13 0801)  . fentaNYL infusion INTRAVENOUS 75 mcg/hr (10/11/13 1000)  . norepinephrine (LEVOPHED) Adult infusion 4 mcg/min (10/11/13 1000)   PRN Meds:.sodium chloride, sodium chloride, acetaminophen, fentaNYL, ondansetron (ZOFRAN) IV, sodium chloride  Assessment/Plan: Principal Problem:   STEMI (ST elevation myocardial infarction) Active Problems:   Cardiogenic shock   Presence of drug coated stent in LAD coronary artery - 2 overlapping Promx DES 2.5 x 38, 2.5 x 12 (postdilated to ~3 mm)   Metabolic acidosis   Acute respiratory failure due to hypoxia from acute pulmonary edema   Acute pulmonary edema with congestive heart failure - due to Anterior STEMI   ECG - evolving Ant-Antlat MI Off Dopamine, improving.  Levophed down to 10. Aggrestat complete last PM - Brilinta Per OG tube given Troponin >20, intubated,  Tele more stable.   CT neg for disection.  CXR with improved pul edema. Pulmonary following vent. WBC elevated - from MI as no SSx of infection    LOS: 2 days   Plan: Echo confirms large Anterior / Anterolateral MI with apical A/DysKinesis as expected -- hope is that with reperfusion, some of the territory is hybernating & may recover.  Will need Life Vest on D/c   Wean Levophed to off - with hopes to wean completely off;   Once off pressors, would work to d/c RFA &RFV lines (while still intubated), then move towards extubation hopefully by this PM  On DAPT & Statin  Once stable off of pressors would consider ACE-I/ARB (as opposed to BB, due to episodic bradycardia --> but low dose BB prior to d/c)  No significant HF with improved CXR, but would anticipate need for low dose diuretic on d/c; start spironolactone today.  Thankfully renal Fxn has remained stable with steady UOP  Nausea with emesis - will need  standing antiemetic.  Not yet ready for [facility] - but will need [facility] & PT eval once able to ambulate - ? D/c needs.  Marykay Lex, M.D., M.S. [facility] [address] [place], [place]  [address]  [phone] Pager # [phone] 10/11/2013 11:00 AM  CRITICAL CARE:  The patient is critically ill with multiple organ systems failure and requires high complexity decision making for assessment and support, frequent evaluation and titration of therapies, application of advanced monitoring technologies and extensive interpretation of multiple databases. Also  Extensive patient & family counseling.  Critical Care Time devoted to patient care services described in this note is 45 minutes.

## 2013-10-12 ENCOUNTER — Inpatient Hospital Stay (HOSPITAL_COMMUNITY): Payer: Medicare Other

## 2013-10-12 ENCOUNTER — Other Ambulatory Visit: Payer: Self-pay | Admitting: Cardiology

## 2013-10-12 DIAGNOSIS — R570 Cardiogenic shock: Secondary | ICD-10-CM | POA: Diagnosis not present

## 2013-10-12 DIAGNOSIS — I2542 Coronary artery dissection: Secondary | ICD-10-CM | POA: Diagnosis not present

## 2013-10-12 DIAGNOSIS — I2109 ST elevation (STEMI) myocardial infarction involving other coronary artery of anterior wall: Secondary | ICD-10-CM | POA: Diagnosis not present

## 2013-10-12 DIAGNOSIS — I501 Left ventricular failure: Secondary | ICD-10-CM | POA: Diagnosis not present

## 2013-10-12 DIAGNOSIS — M79609 Pain in unspecified limb: Secondary | ICD-10-CM

## 2013-10-12 DIAGNOSIS — I219 Acute myocardial infarction, unspecified: Secondary | ICD-10-CM | POA: Diagnosis not present

## 2013-10-12 DIAGNOSIS — J9600 Acute respiratory failure, unspecified whether with hypoxia or hypercapnia: Secondary | ICD-10-CM | POA: Diagnosis not present

## 2013-10-12 LAB — BASIC METABOLIC PANEL
BUN: 20 mg/dL (ref 6–23)
CO2: 21 mEq/L (ref 19–32)
Chloride: 109 mEq/L (ref 96–112)
Creatinine, Ser: 0.65 mg/dL (ref 0.50–1.10)
GFR calc non Af Amer: 82 mL/min — ABNORMAL LOW (ref >90)
Glucose, Bld: 107 mg/dL — ABNORMAL HIGH (ref 70–99)
Potassium: 4 mEq/L (ref 3.5–5.1)

## 2013-10-12 LAB — CBC
HCT: 29.9 % — ABNORMAL LOW (ref 36.0–46.0)
Hemoglobin: 9.6 g/dL — ABNORMAL LOW (ref 12.0–15.0)
MCHC: 32.1 g/dL (ref 30.0–36.0)
RBC: 3.17 MIL/uL — ABNORMAL LOW (ref 3.87–5.11)
WBC: 10.9 10*3/uL — ABNORMAL HIGH (ref 4.0–10.5)

## 2013-10-12 LAB — HEPARIN LEVEL (UNFRACTIONATED): Heparin Unfractionated: <0.1 IU/mL — ABNORMAL LOW (ref 0.30–0.70)

## 2013-10-12 MED ORDER — HEPARIN (PORCINE) IN NACL 100-0.45 UNIT/ML-% IJ SOLN
1500.0000 [IU]/h | INTRAMUSCULAR | Status: DC
  Administered 2013-10-12: 1100 [IU]/h via INTRAVENOUS
  Administered 2013-10-13: 1500 [IU]/h via INTRAVENOUS
  Filled 2013-10-12 (×3): qty 250

## 2013-10-12 MED ORDER — MENTHOL 3 MG MT LOZG
1.0000 | LOZENGE | OROMUCOSAL | Status: DC | PRN
  Filled 2013-10-12: qty 9

## 2013-10-12 MED ORDER — FUROSEMIDE 20 MG PO TABS
20.0000 mg | ORAL_TABLET | Freq: Two times a day (BID) | ORAL | Status: DC
  Administered 2013-10-12 – 2013-10-17 (×11): 20 mg via ORAL
  Filled 2013-10-12 (×15): qty 1

## 2013-10-12 NOTE — Progress Notes (Signed)
*  PRELIMINARY RESULTS* Vascular Ultrasound Lower extremity venous duplex has been completed.  Preliminary findings: no evidence of DVT   Farrel Demark, RDMS, RVT  10/12/2013, 11:33 AM

## 2013-10-12 NOTE — Progress Notes (Signed)
Subjective: Complaining of pain between her shoulder blades, every similar to the pain she presented with. Denies chest pain. No SOB.   Objective: Vital signs in last 24 hours: Temp:  [98.3 F (36.8 C)-98.7 F (37.1 C)] 98.6 F (37 C) (12/25 0400) Pulse Rate:  [51-110] 58 (12/25 0600) Resp:  [12-23] 16 (12/25 0600) BP: (71-151)/(31-122) 107/37 mmHg (12/25 0600) SpO2:  [96 %-100 %] 100 % (12/25 0600) Arterial Line BP: (97-166)/(45-69) 108/45 mmHg (12/24 1400) FiO2 (%):  [30 %] 30 % (12/24 1600) Last BM Date: 10/09/13  Intake/Output from previous day: 12/24 0701 - 12/25 0700 In: 1120 [I.V.:1120] Out: 545 [Urine:545] Intake/Output this shift:    Medications Current Facility-Administered Medications  Medication Dose Route Frequency Provider Last Rate Last Dose  . 0.9 %  sodium chloride infusion  250 mL Intravenous PRN Marykay Lex, MD 20 mL/hr at 10/11/13 1600 250 mL at 10/11/13 1600  . 0.9 %  sodium chloride infusion   Intravenous PRN Marykay Lex, MD      . acetaminophen (TYLENOL) tablet 650 mg  650 mg Oral Q4H PRN Marykay Lex, MD   650 mg at 10/11/13 2140  . amiodarone (NEXTERONE PREMIX) 360 mg/200 mL dextrose IV infusion  30 mg/hr Intravenous Continuous Marykay Lex, MD 16.7 mL/hr at 10/12/13 0105 30 mg/hr at 10/12/13 0105  . antiseptic oral rinse (BIOTENE) solution 15 mL  15 mL Mouth Rinse BID Nelda Bucks, MD   15 mL at 10/11/13 2000  . aspirin chewable tablet 81 mg  81 mg Oral Daily Marykay Lex, MD   81 mg at 10/11/13 0913  . atorvastatin (LIPITOR) tablet 80 mg  80 mg Oral q1800 Merwyn Katos, MD      . fentaNYL (SUBLIMAZE) injection 12.5-25 mcg  12.5-25 mcg Intravenous Q2H PRN Merwyn Katos, MD      . levothyroxine (SYNTHROID, LEVOTHROID) tablet 100 mcg  100 mcg Oral QAC breakfast Marykay Lex, MD   100 mcg at 10/11/13 0900  . menthol-cetylpyridinium (CEPACOL) lozenge 3 mg  1 lozenge Oral PRN Lonia Farber, MD      .  norepinephrine (LEVOPHED) 16 mg in dextrose 5 % 250 mL infusion  2-50 mcg/min Intravenous Titrated Marykay Lex, MD 3.8 mL/hr at 10/12/13 0200 4 mcg/min at 10/12/13 0200  . ondansetron (ZOFRAN) injection 4 mg  4 mg Intravenous Q6H PRN Marykay Lex, MD      . sodium chloride 0.9 % injection 3 mL  3 mL Intravenous Q12H Marykay Lex, MD   10 mL at 10/11/13 0951  . sodium chloride 0.9 % injection 3 mL  3 mL Intravenous PRN Marykay Lex, MD      . Ticagrelor [facility]) tablet 90 mg  90 mg Oral BID Marykay Lex, MD   90 mg at 10/11/13 2140   Facility-Administered Medications Ordered in Other Encounters  Medication Dose Route Frequency Provider Last Rate Last Dose  . etomidate (AMIDATE) injection    Anesthesia Intra-op Hart Robinsons, MD   14 mg at 10/10/13 0105  . rocuronium [facility]) injection    Anesthesia Intra-op Hart Robinsons, MD   50 mg at 10/10/13 0110  . succinylcholine (ANECTINE) injection    Anesthesia Intra-op Hart Robinsons, MD   100 mg at 10/10/13 0105    PE: General appearance: alert, cooperative and no distress Lungs: clear to auscultation bilaterally Heart: irregularly irregular rhythm Extremities: no LEE Pulses: 2+ and symmetric Skin: warm and dry  Neurologic: Grossly normal  Lab Results:   Recent Labs  10/10/13 1400 10/11/13 0500 10/12/13 0616  WBC 21.7* 17.6* 10.9*  HGB 12.3 10.6* 9.6*  HCT 36.8 32.7* 29.9*  PLT 243 216 PLATELETS APPEAR ADEQUATE   BMET  Recent Labs  10/10/13 0430 10/11/13 0500 10/12/13 0616  NA 137 141 139  K 4.0 3.8 4.0  CL 104 111 109  CO2 20 21 21   GLUCOSE 345* 130* 107*  BUN 22 23 20   CREATININE 0.70 0.79 0.65  CALCIUM 8.7 9.3 9.2   PT/INR  Recent Labs  10/09/13 2225  LABPROT 12.4  INR 0.94   Cholesterol  Recent Labs  10/10/13 0430  CHOL 173   Cardiac Panel (last 3 results)  Recent Labs  10/10/13 0800 10/10/13 1530 10/10/13 2215 10/11/13 0500  CKTOTAL 3116* 2469* 1464*  --   CKMB  >500.0* 376.4* 178.2*  --   TROPONINI >20.00* >20.00*  --  >20.00*  RELINDX NOT CALCULATED 15.2* 12.2*  --       Assessment/Plan  Principal Problem:   STEMI (ST elevation myocardial infarction) Active Problems:   Cardiogenic shock   Presence of drug coated stent in LAD coronary artery - 2 overlapping Promx DES 2.5 x 38, 2.5 x 12 (postdilated to ~3 mm)   Metabolic acidosis   Acute respiratory failure due to hypoxia from acute pulmonary edema   Acute pulmonary edema with congestive heart failure - due to Anterior STEMI  Plan: Pt now with back pain between her shoulder blades, very similar to the pain she presented with. No chest pain. I have ordered a STAT EKG to evaluate. Will also recheck troponin to see if levels are trending downward. She continues in A-fib. HR  controlled in the 60-70s. Continue Amiodarone. BP stable on Levophed, 4 mcg/min. Will continue ASA, Brilinta and statin for newly placed DES in LAD. MD to follow.     LOS: 3 days    Melinda Noble 10/12/2013 8:10 AM  Agree with note written by Boyce Medici  PAC  S/P LAD intervention for LAD dissection/ Ant STEMI complicated by CGS on pressors. Just extubated yesterday. MAP about 60 on low dose levophed. Will attempt to wean. C/O back pain which was her presenting symptom but EKG shows no new changes (Qs across her precordium with persistent ST elevation c/w antero apical infarct). EF 30-35% by 2D. She remains in AFIB with CVR on IV amio. Will start on iv hep per Pharm w/o a bolus. Begin low dose oral lasix (UOP decreased and SOB on O2). Follow lytes, BNP. No BB for bow given relative hypotension. Keep in CCU.   Melinda Noble 10/12/2013 9:01 AM

## 2013-10-12 NOTE — Progress Notes (Signed)
ANTICOAGULATION CONSULT NOTE - Initial Consult  Pharmacy Consult for heparin Indication: atrial fibrillation  Allergies  Allergen Reactions  . Iohexol      Code: HIVES, Desc: anaphylactic shock s/p contrast injection many yrs ago--suggested that pt NEVER have iv contrast//a.c., Onset Date: [id number]   . Penicillins Nausea Only  . Sulfa Antibiotics Rash    Patient Measurements: Height: 5\' 6"  (167.6 cm) Weight: 220 lb 7.4 oz (100 kg) IBW/kg (Calculated) : 59.3 Heparin Dosing Weight: 82kg  Vital Signs: Temp: 98.4 F (36.9 C) (12/25 1300) Temp src: Oral (12/25 1300) BP: 127/58 mmHg (12/25 1800) Pulse Rate: 73 (12/25 1800)  Labs:  Recent Labs  10/09/13 2220 10/09/13 2225  10/10/13 0430  10/10/13 0800 10/10/13 1400 10/10/13 1530 10/10/13 2215 10/11/13 0500 10/12/13 0616 10/12/13 1057 10/12/13 1820  HGB 14.9  --   --  13.4  --   --  12.3  --   --  10.6* 9.6*  --   --   HCT 44.9  --   --  40.2  --   --  36.8  --   --  32.7* 29.9*  --   --   PLT 243  --   --  283  < >  --  243  --   --  216 PLATELETS APPEAR ADEQUATE  --   --   APTT  --  33  --   --   --   --   --   --   --   --   --   --   --   LABPROT  --  12.4  --   --   --   --   --   --   --   --   --   --   --   INR  --  0.94  --   --   --   --   --   --   --   --   --   --   --   HEPARINUNFRC  --   --   --   --   --   --   --   --   --   --   --   --  <0.10*  CREATININE 0.87  --   --  0.70  --   --   --   --   --  0.79 0.65  --   --   CKTOTAL  --   --   --   --   --  3116*  --  2469* 1464*  --   --   --   --   CKMB  --   --   --   --   --  >500.0*  --  376.4* 178.2*  --   --   --   --   TROPONINI  --   --   < > >20.00*  --  >20.00*  --  >20.00*  --  >20.00*  --  15.19*  --   < > = values in this interval not displayed.  Estimated Creatinine Clearance: 66.9 ml/min (by C-G formula based on Cr of 0.65).   Medical History: Past Medical History  Diagnosis Date  . Hypertension   . Migraine headache   . Thyroid  disease   . Depression   . Acute respiratory failure due to hypoxia 10/10/2013    Assessment: 82 YOF s/p placement of DES to LAD with recurrent chest  pain. Remains in AFib on amiodarone gtt. No changes on EKG. To start heparin for AFib. No overt bleeding noted. CBC stable. Heparin level undetectable.  Goal of Therapy:  Heparin level 0.3-0.7 units/ml Monitor platelets by anticoagulation protocol: Yes   Plan:  1. Increase heparin drip to 1500 units/hr 2. Heparin level in 8 hours 3. Daily heparin level and CBC 4. Follow for s/s bleeding and long term anticoagulation plans  Talbert Cage, PharmD Clinical Pharmacist 10/12/2013 7:09 PM

## 2013-10-12 NOTE — Progress Notes (Signed)
ANTICOAGULATION CONSULT NOTE - Initial Consult  Pharmacy Consult for heparin Indication: atrial fibrillation  Allergies  Allergen Reactions  . Iohexol      Code: HIVES, Desc: anaphylactic shock s/p contrast injection many yrs ago--suggested that pt NEVER have iv contrast//a.c., Onset Date: [id number]   . Penicillins Nausea Only  . Sulfa Antibiotics Rash    Patient Measurements: Height: 5\' 6"  (167.6 cm) Weight: 220 lb 7.4 oz (100 kg) IBW/kg (Calculated) : 59.3 Heparin Dosing Weight: 82kg  Vital Signs: Temp: 98.6 F (37 C) (12/25 0400) Temp src: Oral (12/25 0400) BP: 104/44 mmHg (12/25 0800) Pulse Rate: 60 (12/25 0800)  Labs:  Recent Labs  10/09/13 2220 10/09/13 2225  10/10/13 0430  10/10/13 0800 10/10/13 1400 10/10/13 1530 10/10/13 2215 10/11/13 0500 10/12/13 0616  HGB 14.9  --   --  13.4  --   --  12.3  --   --  10.6* 9.6*  HCT 44.9  --   --  40.2  --   --  36.8  --   --  32.7* 29.9*  PLT 243  --   --  283  < >  --  243  --   --  216 PLATELETS APPEAR ADEQUATE  APTT  --  33  --   --   --   --   --   --   --   --   --   LABPROT  --  12.4  --   --   --   --   --   --   --   --   --   INR  --  0.94  --   --   --   --   --   --   --   --   --   CREATININE 0.87  --   --  0.70  --   --   --   --   --  0.79 0.65  CKTOTAL  --   --   --   --   --  3116*  --  2469* 1464*  --   --   CKMB  --   --   --   --   --  >500.0*  --  376.4* 178.2*  --   --   TROPONINI  --   --   < > >20.00*  --  >20.00*  --  >20.00*  --  >20.00*  --   < > = values in this interval not displayed.  Estimated Creatinine Clearance: 66.9 ml/min (by C-G formula based on Cr of 0.65).   Medical History: Past Medical History  Diagnosis Date  . Hypertension   . Migraine headache   . Thyroid disease   . Depression   . Acute respiratory failure due to hypoxia 10/10/2013    Assessment: 82 YOF s/p placement of DES to LAD with recurrent chest pain. Remains in AFib on amiodarone gtt. No changes on EKG. To  start heparin for AFib. No overt bleeding noted. CBC stable.  Goal of Therapy:  Heparin level 0.3-0.7 units/ml Monitor platelets by anticoagulation protocol: Yes   Plan:  1. Start heparin drip at 1100 units/hr with NO BOLUS 2. Heparin level in 8 hours 3. Daily heparin level and CBC 4. Follow for s/s bleeding and long term anticoagulation plans  Cash Duce D. Lakeesha Fontanilla, PharmD, BCPS Clinical Pharmacist Pager: [phone] 10/12/2013 9:22 AM

## 2013-10-13 DIAGNOSIS — J9600 Acute respiratory failure, unspecified whether with hypoxia or hypercapnia: Secondary | ICD-10-CM | POA: Diagnosis not present

## 2013-10-13 DIAGNOSIS — I219 Acute myocardial infarction, unspecified: Secondary | ICD-10-CM | POA: Diagnosis not present

## 2013-10-13 DIAGNOSIS — I4891 Unspecified atrial fibrillation: Secondary | ICD-10-CM

## 2013-10-13 DIAGNOSIS — R570 Cardiogenic shock: Secondary | ICD-10-CM | POA: Diagnosis not present

## 2013-10-13 DIAGNOSIS — I501 Left ventricular failure: Secondary | ICD-10-CM | POA: Diagnosis not present

## 2013-10-13 LAB — BASIC METABOLIC PANEL
BUN: 19 mg/dL (ref 6–23)
CO2: 28 mEq/L (ref 19–32)
Chloride: 108 mEq/L (ref 96–112)
Creatinine, Ser: 0.74 mg/dL (ref 0.50–1.10)
GFR calc non Af Amer: 78 mL/min — ABNORMAL LOW (ref >90)
Potassium: 4.4 mEq/L (ref 3.5–5.1)

## 2013-10-13 LAB — CBC
HCT: 29.5 % — ABNORMAL LOW (ref 36.0–46.0)
MCH: 30.8 pg (ref 26.0–34.0)
MCV: 94.6 fL (ref 78.0–100.0)
RBC: 3.12 MIL/uL — ABNORMAL LOW (ref 3.87–5.11)
WBC: 8.1 10*3/uL (ref 4.0–10.5)

## 2013-10-13 LAB — HEPARIN LEVEL (UNFRACTIONATED): Heparin Unfractionated: 0.74 IU/mL — ABNORMAL HIGH (ref 0.30–0.70)

## 2013-10-13 MED ORDER — CARVEDILOL 3.125 MG PO TABS
3.1250 mg | ORAL_TABLET | Freq: Two times a day (BID) | ORAL | Status: DC
  Administered 2013-10-13: 3.125 mg via ORAL
  Filled 2013-10-13 (×4): qty 1

## 2013-10-13 MED ORDER — AMIODARONE HCL 200 MG PO TABS
400.0000 mg | ORAL_TABLET | Freq: Two times a day (BID) | ORAL | Status: DC
  Administered 2013-10-13 – 2013-10-17 (×9): 400 mg via ORAL
  Filled 2013-10-13 (×11): qty 2

## 2013-10-13 NOTE — Progress Notes (Signed)
CARDIAC REHAB PHASE I   PRE:  Rate/Rhythm: 67 sinus rhythm  BP:  Supine:   Sitting: 112/60  Standing:    SaO2: 97% 2L  MODE:  Ambulation: 170 ft   POST:  Rate/Rhythem: 96 sinus  BP:  Supine:   Sitting: 132/70  Standing:    SaO2: 99% 2L  1305-1345 Pt ambulated in hallway x2 assist using rolling walker.  Slow gait, strong veer to right.  Pt took 3 standing rest breaks.  Pt returned to chair, call light in reach. Husband in room.  Pt stated interest in participating in outpatient cardiac rehab.  Pt oriented to CRPII.  At pt request referral will be sent to [facility]  Outpatient cardiac rehab.  Understanding verbalized  Melinda Noble

## 2013-10-13 NOTE — Progress Notes (Signed)
Subjective: No complaints.   Objective: Vital signs in last 24 hours: Temp:  [98.1 F (36.7 C)-98.4 F (36.9 C)] 98.2 F (36.8 C) (12/26 0000) Pulse Rate:  [54-76] 58 (12/26 0700) Resp:  [10-25] 11 (12/26 0700) BP: (93-136)/(33-83) 132/54 mmHg (12/26 0700) SpO2:  [86 %-100 %] 97 % (12/26 0700) Last BM Date: 10/09/13 ("Loose stool" before admission)  Intake/Output from previous day: 12/25 0701 - 12/26 0700 In: 1375.3 [P.O.:360; I.V.:1015.3] Out: 3325 [Urine:3325] Intake/Output this shift:    Medications Current Facility-Administered Medications  Medication Dose Route Frequency Provider Last Rate Last Dose  . 0.9 %  sodium chloride infusion  250 mL Intravenous PRN Marykay Lex, MD 20 mL/hr at 10/11/13 1600 250 mL at 10/11/13 1600  . 0.9 %  sodium chloride infusion   Intravenous PRN Marykay Lex, MD      . acetaminophen (TYLENOL) tablet 650 mg  650 mg Oral Q4H PRN Marykay Lex, MD   650 mg at 10/13/13 0534  . amiodarone (NEXTERONE PREMIX) 360 mg/200 mL dextrose IV infusion  30 mg/hr Intravenous Continuous Marykay Lex, MD 16.7 mL/hr at 10/13/13 0245 30 mg/hr at 10/13/13 0245  . antiseptic oral rinse (BIOTENE) solution 15 mL  15 mL Mouth Rinse BID Nelda Bucks, MD   15 mL at 10/12/13 0844  . aspirin chewable tablet 81 mg  81 mg Oral Daily Marykay Lex, MD   81 mg at 10/12/13 1610  . atorvastatin (LIPITOR) tablet 80 mg  80 mg Oral q1800 Merwyn Katos, MD   80 mg at 10/12/13 1819  . fentaNYL (SUBLIMAZE) injection 12.5-25 mcg  12.5-25 mcg Intravenous Q2H PRN Merwyn Katos, MD   12.5 mcg at 10/12/13 1829  . furosemide (LASIX) tablet 20 mg  20 mg Oral BID Robbie Lis, PA-C   20 mg at 10/12/13 1819  . heparin ADULT infusion 100 units/mL (25000 units/250 mL)  1,500 Units/hr Intravenous Continuous Marykay Lex, MD 15 mL/hr at 10/13/13 0530 1,500 Units/hr at 10/13/13 0530  . levothyroxine (SYNTHROID, LEVOTHROID) tablet 100 mcg  100 mcg Oral QAC breakfast  Marykay Lex, MD   100 mcg at 10/12/13 [phone]  . menthol-cetylpyridinium (CEPACOL) lozenge 3 mg  1 lozenge Oral PRN Lonia Farber, MD      . norepinephrine (LEVOPHED) 16 mg in dextrose 5 % 250 mL infusion  2-50 mcg/min Intravenous Titrated Marykay Lex, MD   1 mcg/min at 10/12/13 1112  . ondansetron (ZOFRAN) injection 4 mg  4 mg Intravenous Q6H PRN Marykay Lex, MD      . sodium chloride 0.9 % injection 3 mL  3 mL Intravenous Q12H Marykay Lex, MD   3 mL at 10/12/13 0931  . sodium chloride 0.9 % injection 3 mL  3 mL Intravenous PRN Marykay Lex, MD      . Ticagrelor [facility]) tablet 90 mg  90 mg Oral BID Marykay Lex, MD   90 mg at 10/12/13 2204   Facility-Administered Medications Ordered in Other Encounters  Medication Dose Route Frequency Provider Last Rate Last Dose  . etomidate (AMIDATE) injection    Anesthesia Intra-op Hart Robinsons, MD   14 mg at 10/10/13 0105  . rocuronium [facility]) injection    Anesthesia Intra-op Hart Robinsons, MD   50 mg at 10/10/13 0110  . succinylcholine (ANECTINE) injection    Anesthesia Intra-op Hart Robinsons, MD   100 mg at 10/10/13 0105    PE: General appearance: alert,  cooperative and no distress Lungs: clear to auscultation bilaterally Heart: regular rate and rhythm Extremities: no LEE Pulses: 2+ and symmetric Skin: warm and dry Neurologic: Grossly normal  Lab Results:   Recent Labs  10/11/13 0500 10/12/13 0616 10/13/13 0505  WBC 17.6* 10.9* 8.1  HGB 10.6* 9.6* 9.6*  HCT 32.7* 29.9* 29.5*  PLT 216 PLATELETS APPEAR ADEQUATE 122*   BMET  Recent Labs  10/11/13 0500 10/12/13 0616 10/13/13 0505  NA 141 139 144  K 3.8 4.0 4.4  CL 111 109 108  CO2 21 21 28   GLUCOSE 130* 107* 107*  BUN 23 20 19   CREATININE 0.79 0.65 0.74  CALCIUM 9.3 9.2 9.5     Assessment/Plan    Principal Problem:   STEMI (ST elevation myocardial infarction) Active Problems:   Cardiogenic shock   Presence of drug coated  stent in LAD coronary artery - 2 overlapping Promx DES 2.5 x 38, 2.5 x 12 (postdilated to ~3 mm)   Metabolic acidosis   Acute respiratory failure due to hypoxia from acute pulmonary edema   Acute pulmonary edema with congestive heart failure - due to Anterior STEMI  Plan: converted to NSR. Will transition to PO Amiodarone. HR stable in the 60s. BP improved. SBP in the 130s. Off of levophed. Plan to get out of bed and in a chair today. Ambulate. Transfer to stepdown.     LOS: 4 days    Brittainy M. Delmer Islam 10/13/2013 7:54 AM  Agree with note written by Boyce Medici  [facility]  Day # 4  Ant STEMI with PCI/Stent LAD complicated by no reflow and CGS. She converted to NSR yesterday on IV Amio and SBP increased subsequent to that allowing pressors to be weaned off. Diuresed on PO diuretic. Feeling better. Exam benign. Labs remarkable for decreasing plts since starting IV hep, therefore will D/C. OOB to chair. Ambulate. [facility] consult. Tx to Step down.   Jamelia Varano J 10/13/2013 8:00 AM

## 2013-10-14 DIAGNOSIS — I219 Acute myocardial infarction, unspecified: Secondary | ICD-10-CM | POA: Diagnosis not present

## 2013-10-14 DIAGNOSIS — J9600 Acute respiratory failure, unspecified whether with hypoxia or hypercapnia: Secondary | ICD-10-CM | POA: Diagnosis not present

## 2013-10-14 DIAGNOSIS — I2109 ST elevation (STEMI) myocardial infarction involving other coronary artery of anterior wall: Secondary | ICD-10-CM | POA: Diagnosis not present

## 2013-10-14 DIAGNOSIS — R570 Cardiogenic shock: Secondary | ICD-10-CM | POA: Diagnosis not present

## 2013-10-14 DIAGNOSIS — I2542 Coronary artery dissection: Secondary | ICD-10-CM | POA: Diagnosis not present

## 2013-10-14 DIAGNOSIS — I501 Left ventricular failure: Secondary | ICD-10-CM | POA: Diagnosis not present

## 2013-10-14 LAB — BASIC METABOLIC PANEL
BUN: 18 mg/dL (ref 6–23)
CO2: 28 mEq/L (ref 19–32)
Chloride: 105 mEq/L (ref 96–112)
GFR calc Af Amer: >90 mL/min (ref >90)
GFR calc non Af Amer: 78 mL/min — ABNORMAL LOW (ref >90)
Glucose, Bld: 95 mg/dL (ref 70–99)
Potassium: 4.1 mEq/L (ref 3.5–5.1)
Sodium: 140 mEq/L (ref 135–145)

## 2013-10-14 LAB — CBC
HCT: 32.9 % — ABNORMAL LOW (ref 36.0–46.0)
Hemoglobin: 10.5 g/dL — ABNORMAL LOW (ref 12.0–15.0)
MCH: 30.2 pg (ref 26.0–34.0)
MCHC: 31.9 g/dL (ref 30.0–36.0)
RBC: 3.48 MIL/uL — ABNORMAL LOW (ref 3.87–5.11)
RDW: 14.3 % (ref 11.5–15.5)

## 2013-10-14 MED ORDER — CARVEDILOL 6.25 MG PO TABS
6.2500 mg | ORAL_TABLET | Freq: Two times a day (BID) | ORAL | Status: DC
  Administered 2013-10-14 – 2013-10-17 (×6): 6.25 mg via ORAL
  Filled 2013-10-14 (×9): qty 1

## 2013-10-14 MED ORDER — LISINOPRIL 5 MG PO TABS
5.0000 mg | ORAL_TABLET | Freq: Every day | ORAL | Status: DC
  Administered 2013-10-14 – 2013-10-17 (×4): 5 mg via ORAL
  Filled 2013-10-14 (×4): qty 1

## 2013-10-14 MED ORDER — ALPRAZOLAM 0.25 MG PO TABS
0.2500 mg | ORAL_TABLET | Freq: Three times a day (TID) | ORAL | Status: DC | PRN
  Administered 2013-10-14 – 2013-10-17 (×4): 0.25 mg via ORAL
  Filled 2013-10-14 (×4): qty 1

## 2013-10-14 NOTE — Progress Notes (Signed)
Subjective:  No CP/SOB  Objective:  Temp:  [98.1 F (36.7 C)-99.1 F (37.3 C)] 98.1 F (36.7 C) (12/27 0738) Pulse Rate:  [57-66] 66 (12/27 0738) Resp:  [13-20] 18 (12/27 0738) BP: (117-158)/(41-72) 149/72 mmHg (12/27 0738) SpO2:  [94 %-100 %] 99 % (12/27 0738) Weight change:   Intake/Output from previous day: 12/26 0701 - 12/27 0700 In: 656.9 [P.O.:600; I.V.:56.9] Out: 1925 [Urine:1925]  Intake/Output from this shift:    Physical Exam: General appearance: alert and no distress Neck: no adenopathy, no carotid bruit, no JVD, supple, symmetrical, trachea midline and thyroid not enlarged, symmetric, no tenderness/mass/nodules Lungs: clear to auscultation bilaterally Heart: regular rate and rhythm, S1, S2 normal, no murmur, click, rub or gallop Extremities: extremities normal, atraumatic, no cyanosis or edema and Right groin ecchymotic  Lab Results: Results for orders placed during the hospital encounter of 10/09/13 (from the past 48 hour(s))  TROPONIN I     Status: Abnormal   Collection Time    10/12/13 10:57 AM      Result Value Range   Troponin I 15.19 (*) <0.30 ng/mL   Comment:            Due to the release kinetics of cTnI,     a negative result within the first hours     of the onset of symptoms does not rule out     myocardial infarction with certainty.     If myocardial infarction is still suspected,     repeat the test at appropriate intervals.     CRITICAL VALUE NOTED.  VALUE IS CONSISTENT WITH PREVIOUSLY REPORTED AND CALLED VALUE.  HEPARIN LEVEL (UNFRACTIONATED)     Status: Abnormal   Collection Time    10/12/13  6:20 PM      Result Value Range   Heparin Unfractionated <0.10 (*) 0.30 - 0.70 IU/mL   Comment:            IF HEPARIN RESULTS ARE BELOW     EXPECTED VALUES, AND PATIENT     DOSAGE HAS BEEN CONFIRMED,     SUGGEST FOLLOW UP TESTING     OF ANTITHROMBIN III LEVELS.  CBC     Status: Abnormal   Collection Time    10/13/13  5:05 AM   Result Value Range   WBC 8.1  4.0 - 10.5 K/uL   RBC 3.12 (*) 3.87 - 5.11 MIL/uL   Hemoglobin 9.6 (*) 12.0 - 15.0 g/dL   HCT 16.1 (*) 09.6 - 04.5 %   MCV 94.6  78.0 - 100.0 fL   MCH 30.8  26.0 - 34.0 pg   MCHC 32.5  30.0 - 36.0 g/dL   RDW 40.9  81.1 - 91.4 %   Platelets 122 (*) 150 - 400 K/uL  BASIC METABOLIC PANEL     Status: Abnormal   Collection Time    10/13/13  5:05 AM      Result Value Range   Sodium 144  135 - 145 mEq/L   Potassium 4.4  3.5 - 5.1 mEq/L   Chloride 108  96 - 112 mEq/L   CO2 28  19 - 32 mEq/L   Glucose, Bld 107 (*) 70 - 99 mg/dL   BUN 19  6 - 23 mg/dL   Creatinine, Ser 7.82  0.50 - 1.10 mg/dL   Calcium 9.5  8.4 - 95.6 mg/dL   GFR calc non Af Amer 78 (*) >90 mL/min   GFR calc Af Amer >90  >90  mL/min   Comment: (NOTE)     The eGFR has been calculated using the CKD EPI equation.     This calculation has not been validated in all clinical situations.     eGFR's persistently <90 mL/min signify possible Chronic Kidney     Disease.  HEPARIN LEVEL (UNFRACTIONATED)     Status: Abnormal   Collection Time    10/13/13  7:20 AM      Result Value Range   Heparin Unfractionated 0.74 (*) 0.30 - 0.70 IU/mL   Comment:            IF HEPARIN RESULTS ARE BELOW     EXPECTED VALUES, AND PATIENT     DOSAGE HAS BEEN CONFIRMED,     SUGGEST FOLLOW UP TESTING     OF ANTITHROMBIN III LEVELS.  CBC     Status: Abnormal   Collection Time    10/14/13  6:03 AM      Result Value Range   WBC 8.6  4.0 - 10.5 K/uL   RBC 3.48 (*) 3.87 - 5.11 MIL/uL   Hemoglobin 10.5 (*) 12.0 - 15.0 g/dL   HCT 13.0 (*) 86.5 - 78.4 %   MCV 94.5  78.0 - 100.0 fL   MCH 30.2  26.0 - 34.0 pg   MCHC 31.9  30.0 - 36.0 g/dL   RDW 69.6  29.5 - 28.4 %   Platelets 158  150 - 400 K/uL  BASIC METABOLIC PANEL     Status: Abnormal   Collection Time    10/14/13  6:03 AM      Result Value Range   Sodium 140  135 - 145 mEq/L   Potassium 4.1  3.5 - 5.1 mEq/L   Chloride 105  96 - 112 mEq/L   CO2 28  19 - 32 mEq/L    Glucose, Bld 95  70 - 99 mg/dL   BUN 18  6 - 23 mg/dL   Creatinine, Ser 1.32  0.50 - 1.10 mg/dL   Calcium 9.3  8.4 - 44.0 mg/dL   GFR calc non Af Amer 78 (*) >90 mL/min   GFR calc Af Amer >90  >90 mL/min   Comment: (NOTE)     The eGFR has been calculated using the CKD EPI equation.     This calculation has not been validated in all clinical situations.     eGFR's persistently <90 mL/min signify possible Chronic Kidney     Disease.    Imaging: Imaging results have been reviewed  Assessment/Plan:   1. Principal Problem: 2.   STEMI (ST elevation myocardial infarction) 3. Active Problems: 4.   Cardiogenic shock 5.   Presence of drug coated stent in LAD coronary artery - 2 overlapping Promx DES 2.5 x 38, 2.5 x 12 (postdilated to ~3 mm) 6.   Metabolic acidosis 7.   Acute respiratory failure due to hypoxia from acute pulmonary edema 8.   Acute pulmonary edema with congestive heart failure - due to Anterior STEMI 9.   Time Spent Directly with Patient:  20 minutes  Length of Stay:  LOS: 5 days   Day #5 Ant STEMI Rx with direct intervention complicated by CGS requiring pressors. LVEF 35% by 2D 12/23. Was in AF  But converted to NSR on IV now PO amio. Plts have increased off of iv Hep. Denies CP/SOB. Ambulating with [facility]. Exam benign. Will titrate meds (BP OK). Add ACE-I. Tx tele tomorrow then prob D/C home on Monday (her Birthday!!!).  Runell Gess 10/14/2013, 7:49 AM

## 2013-10-14 NOTE — Progress Notes (Signed)
Sleeping while turned onto Rt side .VSS .Spouse at bedside.

## 2013-10-14 NOTE — Progress Notes (Addendum)
CARDIAC REHAB PHASE I   PRE:  Rate/Rhythm: 68 NSR  BP:  Supine: 147/61 Sitting:   Standing:    SaO2: 98% RA  MODE:  Ambulation: 140 ft   POST:  Rate/Rhythm: 100 NSR  BP:  Supine:   Sitting: 179/66 rck 152/58 Standing:    SaO2: 99% RA  4098-1191 Patient ambulated 140 ft with assist x2 and pushing rolling walker. Gait steady, c/o SOB, "feeling tired" mid-way through walk. To chair after walk with legs elevated, call bell within reach. MI book given, patient received breakfast will return to continue MI education.   [id number] MI education completed with patient including, restrictions, risk factor modification, ASA and Brilinta use, CP, NTG use and calling 911. Activity progression discussed, heart healthy diet given, stent card and MI book given. Patient voices understanding of instructions given.  Cristy Hilts, MS, ACSM CES

## 2013-10-14 NOTE — Progress Notes (Signed)
C/O ACHING PAIN ,"9" / 10 ON PAIN SCALE, TO lT SCAPULA . SEE VS SECTION .Systolic  B/p = 84 ( correlates w/ SBP taken w/doppler). Fentanyl given IV at 13:28p for same pain . See 12-lead ECG  Done at that time .Another 12-lead ECG done at this time and Dr again informed . No changes in cephalocaudel assessment.Reassurance provided to Pt and spouse .

## 2013-10-15 DIAGNOSIS — I4891 Unspecified atrial fibrillation: Secondary | ICD-10-CM | POA: Diagnosis not present

## 2013-10-15 DIAGNOSIS — I501 Left ventricular failure: Secondary | ICD-10-CM | POA: Diagnosis not present

## 2013-10-15 DIAGNOSIS — I219 Acute myocardial infarction, unspecified: Secondary | ICD-10-CM | POA: Diagnosis not present

## 2013-10-15 DIAGNOSIS — R570 Cardiogenic shock: Secondary | ICD-10-CM | POA: Diagnosis not present

## 2013-10-15 LAB — CBC
HCT: 30.5 % — ABNORMAL LOW (ref 36.0–46.0)
Hemoglobin: 9.9 g/dL — ABNORMAL LOW (ref 12.0–15.0)
MCHC: 32.5 g/dL (ref 30.0–36.0)
Platelets: 165 10*3/uL (ref 150–400)

## 2013-10-15 LAB — BASIC METABOLIC PANEL
BUN: 22 mg/dL (ref 6–23)
Chloride: 104 mEq/L (ref 96–112)
GFR calc non Af Amer: 77 mL/min — ABNORMAL LOW (ref >90)
Glucose, Bld: 105 mg/dL — ABNORMAL HIGH (ref 70–99)
Potassium: 3.4 mEq/L — ABNORMAL LOW (ref 3.5–5.1)

## 2013-10-15 MED ORDER — SENNA 8.6 MG PO TABS
1.0000 | ORAL_TABLET | Freq: Every day | ORAL | Status: DC | PRN
  Administered 2013-10-15: 8.6 mg via ORAL
  Filled 2013-10-15: qty 1

## 2013-10-15 MED ORDER — DOCUSATE SODIUM 100 MG PO CAPS
100.0000 mg | ORAL_CAPSULE | Freq: Two times a day (BID) | ORAL | Status: DC | PRN
  Administered 2013-10-15: 100 mg via ORAL
  Filled 2013-10-15: qty 1

## 2013-10-15 MED ORDER — POTASSIUM CHLORIDE CRYS ER 20 MEQ PO TBCR
40.0000 meq | EXTENDED_RELEASE_TABLET | Freq: Once | ORAL | Status: AC
  Administered 2013-10-15: 40 meq via ORAL
  Filled 2013-10-15: qty 2

## 2013-10-15 MED ORDER — FLEET ENEMA 7-19 GM/118ML RE ENEM
1.0000 | ENEMA | Freq: Every day | RECTAL | Status: DC | PRN
  Filled 2013-10-15: qty 1

## 2013-10-15 NOTE — Progress Notes (Signed)
Subjective:  No CP/SOB  Objective:  Temp:  [97.8 F (36.6 C)-99.3 F (37.4 C)] 98.2 F (36.8 C) (12/28 0318) Pulse Rate:  [48-73] 63 (12/28 0318) Resp:  [15-98] 98 (12/28 0318) BP: (82-149)/(26-72) 104/47 mmHg (12/28 0318) SpO2:  [94 %-100 %] 94 % (12/28 0318) Weight change:   Intake/Output from previous day: 12/27 0701 - 12/28 0700 In: 420 [P.O.:420] Out: 600 [Urine:600]  Intake/Output from this shift:    Physical Exam: General appearance: alert and no distress Neck: no adenopathy, no carotid bruit, no JVD, supple, symmetrical, trachea midline and thyroid not enlarged, symmetric, no tenderness/mass/nodules Lungs: clear to auscultation bilaterally Heart: regular rate and rhythm, S1, S2 normal, no murmur, click, rub or gallop Extremities: extremities normal, atraumatic, no cyanosis or edema  Lab Results: Results for orders placed during the hospital encounter of 10/09/13 (from the past 48 hour(s))  CBC     Status: Abnormal   Collection Time    10/14/13  6:03 AM      Result Value Range   WBC 8.6  4.0 - 10.5 K/uL   RBC 3.48 (*) 3.87 - 5.11 MIL/uL   Hemoglobin 10.5 (*) 12.0 - 15.0 g/dL   HCT 81.1 (*) 91.4 - 78.2 %   MCV 94.5  78.0 - 100.0 fL   MCH 30.2  26.0 - 34.0 pg   MCHC 31.9  30.0 - 36.0 g/dL   RDW 95.6  21.3 - 08.6 %   Platelets 158  150 - 400 K/uL  BASIC METABOLIC PANEL     Status: Abnormal   Collection Time    10/14/13  6:03 AM      Result Value Range   Sodium 140  135 - 145 mEq/L   Potassium 4.1  3.5 - 5.1 mEq/L   Chloride 105  96 - 112 mEq/L   CO2 28  19 - 32 mEq/L   Glucose, Bld 95  70 - 99 mg/dL   BUN 18  6 - 23 mg/dL   Creatinine, Ser 5.78  0.50 - 1.10 mg/dL   Calcium 9.3  8.4 - 46.9 mg/dL   GFR calc non Af Amer 78 (*) >90 mL/min   GFR calc Af Amer >90  >90 mL/min   Comment: (NOTE)     The eGFR has been calculated using the CKD EPI equation.     This calculation has not been validated in all clinical situations.     eGFR's persistently  <90 mL/min signify possible Chronic Kidney     Disease.  CBC     Status: Abnormal   Collection Time    10/15/13  5:00 AM      Result Value Range   WBC 8.7  4.0 - 10.5 K/uL   RBC 3.25 (*) 3.87 - 5.11 MIL/uL   Hemoglobin 9.9 (*) 12.0 - 15.0 g/dL   HCT 62.9 (*) 52.8 - 41.3 %   MCV 93.8  78.0 - 100.0 fL   MCH 30.5  26.0 - 34.0 pg   MCHC 32.5  30.0 - 36.0 g/dL   RDW 24.4  01.0 - 27.2 %   Platelets 165  150 - 400 K/uL  BASIC METABOLIC PANEL     Status: Abnormal   Collection Time    10/15/13  5:00 AM      Result Value Range   Sodium 139  135 - 145 mEq/L   Potassium 3.4 (*) 3.5 - 5.1 mEq/L   Comment: DELTA CHECK NOTED   Chloride 104  96 - 112 mEq/L   CO2 25  19 - 32 mEq/L   Glucose, Bld 105 (*) 70 - 99 mg/dL   BUN 22  6 - 23 mg/dL   Creatinine, Ser 1.61  0.50 - 1.10 mg/dL   Calcium 9.2  8.4 - 09.6 mg/dL   GFR calc non Af Amer 77 (*) >90 mL/min   GFR calc Af Amer 89 (*) >90 mL/min   Comment: (NOTE)     The eGFR has been calculated using the CKD EPI equation.     This calculation has not been validated in all clinical situations.     eGFR's persistently <90 mL/min signify possible Chronic Kidney     Disease.    Imaging: Imaging results have been reviewed  Assessment/Plan:   1. Principal Problem: 2.   STEMI (ST elevation myocardial infarction) 3. Active Problems: 4.   Cardiogenic shock 5.   Presence of drug coated stent in LAD coronary artery - 2 overlapping Promx DES 2.5 x 38, 2.5 x 12 (postdilated to ~3 mm) 6.   Metabolic acidosis 7.   Acute respiratory failure due to hypoxia from acute pulmonary edema 8.   Acute pulmonary edema with congestive heart failure - due to Anterior STEMI 9.   Time Spent Directly with Patient:  20 minutes  Day #6 Ant STEMI/PCI/Stent with CGS and PAF. Marked improvement. NSR. VSS. Exam benign. Meds adjusted yesterday. Labs OK except for low K (replace). [facility]. Tx tele. Ambulate. Prob back to [facility]. ROV with [facility] 1-2 weeks then  [facility].   Length of Stay:  LOS: 6 days    Tidus Upchurch J 10/15/2013, 7:35 AM

## 2013-10-16 DIAGNOSIS — I255 Ischemic cardiomyopathy: Secondary | ICD-10-CM | POA: Diagnosis present

## 2013-10-16 DIAGNOSIS — I10 Essential (primary) hypertension: Secondary | ICD-10-CM | POA: Diagnosis not present

## 2013-10-16 DIAGNOSIS — E785 Hyperlipidemia, unspecified: Secondary | ICD-10-CM | POA: Diagnosis not present

## 2013-10-16 DIAGNOSIS — I2589 Other forms of chronic ischemic heart disease: Secondary | ICD-10-CM | POA: Diagnosis not present

## 2013-10-16 DIAGNOSIS — I219 Acute myocardial infarction, unspecified: Secondary | ICD-10-CM | POA: Diagnosis not present

## 2013-10-16 DIAGNOSIS — I517 Cardiomegaly: Secondary | ICD-10-CM | POA: Diagnosis not present

## 2013-10-16 DIAGNOSIS — I480 Paroxysmal atrial fibrillation: Secondary | ICD-10-CM | POA: Diagnosis not present

## 2013-10-16 LAB — BASIC METABOLIC PANEL
BUN: 22 mg/dL (ref 6–23)
Calcium: 9.2 mg/dL (ref 8.4–10.5)
Creatinine, Ser: 0.81 mg/dL (ref 0.50–1.10)
GFR calc non Af Amer: 66 mL/min — ABNORMAL LOW (ref >90)
Glucose, Bld: 101 mg/dL — ABNORMAL HIGH (ref 70–99)

## 2013-10-16 LAB — CBC
HCT: 29.4 % — ABNORMAL LOW (ref 36.0–46.0)
Hemoglobin: 9.6 g/dL — ABNORMAL LOW (ref 12.0–15.0)
MCH: 30.9 pg (ref 26.0–34.0)
MCHC: 32.7 g/dL (ref 30.0–36.0)
MCV: 94.5 fL (ref 78.0–100.0)

## 2013-10-16 MED ORDER — PERFLUTREN LIPID MICROSPHERE
1.0000 mL | INTRAVENOUS | Status: DC | PRN
  Filled 2013-10-16: qty 10

## 2013-10-16 MED ORDER — PERFLUTREN LIPID MICROSPHERE
1.0000 mL | Freq: Once | INTRAVENOUS | Status: AC
  Administered 2013-10-16: 2 mL via INTRAVENOUS
  Filled 2013-10-16 (×2): qty 10

## 2013-10-16 MED ORDER — LOPERAMIDE HCL 2 MG PO CAPS
2.0000 mg | ORAL_CAPSULE | Freq: Two times a day (BID) | ORAL | Status: DC | PRN
Start: 2013-10-16 — End: 2013-10-17
  Administered 2013-10-16: 2 mg via ORAL
  Filled 2013-10-16: qty 1

## 2013-10-16 NOTE — Progress Notes (Signed)
Subjective:  No chest pain  Objective:  Vital Signs in the last 24 hours: Temp:  [98.7 F (37.1 C)-99 F (37.2 C)] 98.7 F (37.1 C) (12/29 0547) Pulse Rate:  [62-65] 62 (12/29 0802) Resp:  [16-18] 16 (12/29 0802) BP: (92-129)/(42-61) 129/50 mmHg (12/29 0802) SpO2:  [95 %-99 %] 95 % (12/29 0802)  Intake/Output from previous day:  Intake/Output Summary (Last 24 hours) at 10/16/13 1610 Last data filed at 10/15/13 2100  Gross per 24 hour  Intake    360 ml  Output    601 ml  Net   -241 ml    Physical Exam: General appearance: alert, cooperative and no distress Lungs: clear to auscultation bilaterally Heart: regular rate and rhythm   Rate: 62  Rhythm: normal sinus rhythm  Lab Results:  Recent Labs  10/15/13 0500 10/16/13 0555  WBC 8.7 9.1  HGB 9.9* 9.6*  PLT 165 182    Recent Labs  10/15/13 0500 10/16/13 0555  NA 139 138  K 3.4* 3.7  CL 104 104  CO2 25 25  GLUCOSE 105* 101*  BUN 22 22  CREATININE 0.79 0.81   No results found for this basename: TROPONINI, CK, MB,  in the last 72 hours No results found for this basename: INR,  in the last 72 hours  Imaging: Imaging results have been reviewed  Cardiac Studies:  Assessment/Plan:   Principal Problem:   STEMI 10/09/13 Active Problems:   Presence of drug coated stent in LAD coronary artery - 2 overlapping Promx DES 2.5 x 38, 2.5 x 12 (postdilated to ~3 mm)   Cardiomyopathy, ischemic- 30-35% on admission 10/09/13   PAF- peri-MI. NSR on Amio at discharge   Cardiogenic shock- resolved   Metabolic acidosis- resolved   Acute respiratory failure due to hypoxia from acute pulmonary edema- resolved   Hx of HTN   Dyslipidemia- LDL 112   Acute pulmonary edema with congestive heart failure - due to Anterior STEMI- resolved   PLAN: Echo today. Possible discharge later. Continue Amio for now post MI but try and avoid anticoagulation if possible.   Corine Shelter PA-C Beeper [phone] 10/16/2013, 9:28 AM  I have  seen and evaluated the patient this AM along with Corine Shelter, PA.  I agree with his findings, examination as well as impression recommendations.  Looks & feels good today.  Walking with [facility]. BP & HR stable - no further Afib or CHF Sx.   Tolerating good baseline doses of ACE-I & Carvedilol  On PO Diuretic - no nausea  Continue Amiodarone load PO for peri-MI Afib.  Will continue until ~90 day f/u Echo (for Afib / & potential VT suppression given reduced EF with Anterior Scar).  Plan re-look Echo today w/ contrast to reassess EF +/- LV Apical thrombus (hope is to avoid triple medication for Antiplatelet & Anticoagulation).  Pending results of Echo - anticipate possible d/c later today.  If + thrombus, will need to keep & initiate Heparin/Warfarin  If ~20-30% EF, would order LifeVest (that may hold up d/c)  If EF >= 35% -- d/c to [name].   Marykay Lex, M.D., M.S. [facility] [address] [place], [place]  [address]  [phone] Pager # [phone] 10/16/2013 9:59 AM

## 2013-10-16 NOTE — Progress Notes (Signed)
RN notified social worker & [place], [place] of patient's intentions to be discharged to rehab section of her living facility, pending FL2 completion.  Social worker consult placed today.  Will continue to monitor. [place], Mitzi Hansen

## 2013-10-16 NOTE — Progress Notes (Signed)
  Echocardiogram 2D Echocardiogram has been performed.  [name] 10/16/2013, 2:16 PM

## 2013-10-16 NOTE — Evaluation (Signed)
Physical Therapy Evaluation Patient Details Name: DONTA MCINROY MRN: [id number] DOB: May 23, 1930 Today's Date: 10/16/2013 Time: 4098-1191 PT Time Calculation (min): 12 min  PT Assessment / Plan / Recommendation History of Present Illness  79 y.o. female who initially presented to [facility] ER with Severe pain in the back btw the shoulder blades radiating to arms. Had been having Sx most of the day - that "never stopped". Upon initial evaluation --ECG noted to have what appeared to be diffuse J point elevation - more prominent in Anterior Leads - no reciprocal changes.   Clinical Impression  Pt presents with decreased activity tolerance.  No further acute care PT needs identified at this time.   Pt will benefit from cardiac rehab at d/c and will need RW for energy conservation.    PT Assessment  All further PT needs can be met in the next venue of care    Follow Up Recommendations  Other (comment) (cardiac rehab)    Does the patient have the potential to tolerate intense rehabilitation      Barriers to Discharge        Equipment Recommendations  Rolling walker with 5" wheels    Recommendations for Other Services     Frequency      Precautions / Restrictions Restrictions Weight Bearing Restrictions: No   Pertinent Vitals/Pain No c/o pain      Mobility  Transfers Transfers: Sit to Stand;Stand to Sit Sit to Stand: 6: Modified independent (Device/Increase time) Stand to Sit: 6: Modified independent (Device/Increase time) Ambulation/Gait Ambulation/Gait Assistance: 6: Modified independent (Device/Increase time) Ambulation Distance (Feet): 100 Feet Assistive device: None Ambulation/Gait Assistance Details: pt requires multiple standing rest breaks during gait due to fatigue.  pt with no LOB.  Discussed with pt/daughters PT recommendation to use RW at this time for energy conservation, all in agreement.    Exercises     PT Diagnosis: Difficulty walking;Generalized weakness  PT  Problem List: Decreased strength;Decreased activity tolerance;Decreased mobility PT Treatment Interventions:       PT Goals(Current goals can be found in the care plan section)    Visit Information  Last PT Received On: 10/16/13 Assistance Needed: +1 History of Present Illness: 79 y.o. female who initially presented to [facility] ER with Severe pain in the back btw the shoulder blades radiating to arms. Had been having Sx most of the day - that "never stopped". Upon initial evaluation --ECG noted to have what appeared to be diffuse J point elevation - more prominent in Anterior Leads - no reciprocal changes.        Prior Functioning  Home Living Family/patient expects to be discharged to:: Assisted living Home Equipment: None Prior Function Level of Independence: Independent Communication Communication: No difficulties    Cognition  Cognition Arousal/Alertness: Awake/alert Behavior During Therapy: WFL for tasks assessed/performed Overall Cognitive Status: Within Functional Limits for tasks assessed    Extremity/Trunk Assessment Lower Extremity Assessment Lower Extremity Assessment: Generalized weakness Cervical / Trunk Assessment Cervical / Trunk Assessment: Normal   Balance    End of Session PT - End of Session Equipment Utilized During Treatment: Gait belt Activity Tolerance: Patient tolerated treatment well Patient left: in chair;with call bell/phone within reach;with family/visitor present Nurse Communication: Mobility status  GP     Granvel Proudfoot 10/16/2013, 1:17 PM

## 2013-10-16 NOTE — Progress Notes (Signed)
CARDIAC REHAB PHASE I   PRE:  Rate/Rhythm: 66 SR  BP:  Supine:   Sitting: 90/40  Standing:    SaO2: 100 RA  MODE:  Ambulation: 372 ft   POST:  Rate/Rhythm:   BP:  Supine:   Sitting: 110/66  Standing:    SaO2: 98 RA 0850-0930 Assisted X 1 and used walker to ambulate. Gait steady with walker. Pt able to walk 372 feet without c/o of cp or SOB. VS stable Pt back to recliner after walk with call light in reach and family present.  Melina Copa RN 10/16/2013 9:29 AM

## 2013-10-17 ENCOUNTER — Encounter (HOSPITAL_COMMUNITY): Payer: Self-pay | Admitting: Cardiology

## 2013-10-17 DIAGNOSIS — J9600 Acute respiratory failure, unspecified whether with hypoxia or hypercapnia: Secondary | ICD-10-CM | POA: Diagnosis not present

## 2013-10-17 DIAGNOSIS — I219 Acute myocardial infarction, unspecified: Secondary | ICD-10-CM | POA: Diagnosis not present

## 2013-10-17 DIAGNOSIS — R570 Cardiogenic shock: Secondary | ICD-10-CM | POA: Diagnosis not present

## 2013-10-17 DIAGNOSIS — I501 Left ventricular failure: Secondary | ICD-10-CM | POA: Diagnosis not present

## 2013-10-17 LAB — CBC
HCT: 31.2 % — ABNORMAL LOW (ref 36.0–46.0)
MCH: 30.4 pg (ref 26.0–34.0)
MCHC: 32.1 g/dL (ref 30.0–36.0)
MCV: 94.8 fL (ref 78.0–100.0)
Platelets: 203 10*3/uL (ref 150–400)
RDW: 14.6 % (ref 11.5–15.5)

## 2013-10-17 LAB — BASIC METABOLIC PANEL
BUN: 21 mg/dL (ref 6–23)
Chloride: 104 mEq/L (ref 96–112)
Creatinine, Ser: 0.86 mg/dL (ref 0.50–1.10)
GFR calc Af Amer: 71 mL/min — ABNORMAL LOW (ref >90)
GFR calc non Af Amer: 62 mL/min — ABNORMAL LOW (ref >90)
Glucose, Bld: 103 mg/dL — ABNORMAL HIGH (ref 70–99)

## 2013-10-17 MED ORDER — AMIODARONE HCL 200 MG PO TABS: 200.0000 mg | ORAL_TABLET | Freq: Every day | ORAL | Status: DC

## 2013-10-17 MED ORDER — AMIODARONE HCL 400 MG PO TABS: 400.0000 mg | ORAL_TABLET | Freq: Two times a day (BID) | ORAL | Status: DC

## 2013-10-17 MED ORDER — CARVEDILOL 6.25 MG PO TABS: 6.2500 mg | ORAL_TABLET | Freq: Two times a day (BID) | ORAL | Status: DC

## 2013-10-17 MED ORDER — TICAGRELOR 90 MG PO TABS: 90.0000 mg | ORAL_TABLET | Freq: Two times a day (BID) | ORAL | Status: DC

## 2013-10-17 MED ORDER — LISINOPRIL 5 MG PO TABS: 5.0000 mg | ORAL_TABLET | Freq: Every day | ORAL | Status: DC

## 2013-10-17 MED ORDER — ATORVASTATIN CALCIUM 80 MG PO TABS: 80.0000 mg | ORAL_TABLET | Freq: Every day | ORAL | Status: DC

## 2013-10-17 MED ORDER — FUROSEMIDE 20 MG PO TABS: 20.0000 mg | ORAL_TABLET | Freq: Two times a day (BID) | ORAL | Status: DC

## 2013-10-17 MED ORDER — ASPIRIN 81 MG PO CHEW: 81.0000 mg | CHEWABLE_TABLET | Freq: Every day | ORAL | Status: AC

## 2013-10-17 MED ORDER — AMIODARONE HCL 200 MG PO TABS: 200.0000 mg | ORAL_TABLET | Freq: Two times a day (BID) | ORAL | Status: DC

## 2013-10-17 NOTE — Progress Notes (Signed)
CARDIAC REHAB PHASE I   PRE:  Rate/Rhythm: 58 SB  BP:  Supine:   Sitting: 100/48  Standing:    SaO2: 98 RA  MODE:  Ambulation: 300 ft   POST:  Rate/Rhythm: 74  BP:  Supine:   Sitting: 100/50  Standing:    SaO2: 96 RA 1020-1045 Assisted X 1 and used walker to ambulate. Gait steady with walker. Pt is DOE and tires easily. No c/o of cp. VS stable Pt back to recliner after walk with call light in reach and family present.   Melina Copa RN 10/17/2013 10:43 AM

## 2013-10-17 NOTE — Discharge Summary (Signed)
Physician Discharge Summary  Patient ID: Melinda Noble MRN: [id number] DOB/AGE: 1930-04-17 79 y.o.  Admit date: 10/09/2013 Discharge date: 10/17/2013  Admission Diagnoses: STEMI  Discharge Diagnoses:  Principal Problem:   STEMI 10/09/13 Active Problems:   Cardiogenic shock- resolved   Presence of drug coated stent in LAD coronary artery - 2 overlapping Promx DES 2.5 x 38, 2.5 x 12 (postdilated to ~3 mm)   Metabolic acidosis- resolved   Acute respiratory failure due to hypoxia from acute pulmonary edema- resolved   Acute pulmonary edema with congestive heart failure - due to Anterior STEMI- resolved   Hx of HTN   Cardiomyopathy, ischemic- 30-35% on admission 10/09/13   Dyslipidemia- LDL 112   PAF- peri-MI. NSR on Amio at discharge   Discharged Condition: stable  Hospital Course: Melinda Noble is a 79 y.o. female with a history of hypertension and hypothyroidism but no prior coronary disease, as well as reportedly well controlled lipids. Earlier in the day on 10/09/2013, she noted severe pain localized in her chest and between her shoulder blades radiating to both shoulders. She initially went to her primary physician's office where an ECG failed to show any abnormalities. The pain persisted throughout the day. She then presented to [facility] Emergency Room, where she was found to be quite hypertensive and she described a sharp tearing type pain that sounded concerning for possible aortic dissection. She had diffuse anterolateral ST elevations but without reciprocal changes, in fact there is mild inferior solutions also noted. Based on these findings, and the concern of how she presented with the description or symptoms and hypertension, a baseline troponin was checked and she was taken for emergent CT Angiogram of the chest to rule out possible aortic dissection prior to anticoagulation. Her initial troponin was 1.2, was felt to be relatively low for ongoing chest pain all day long  with possible anterior ST elevations.  The CT scan did not show any dissection, however upon arriving back to the emergency room the patient's pain got progressively worse, and a repeat ECG showed clear-cut anterior ST elevations of at least 3-4 mm with reciprocal changes. Code STEMI was called and she was transported to the [facility] Cardiac Catheterization Lab via the emergency room for stabilization. Upon arrival she was in 9-10 out of 10 pain with significant anterior elevations were noted.   The procedure was performed by Dr. [name]. She was found to have a 100% occlusion of the LAD. Following initial angioplasty, a focal dissection was noted just beyond the initial occlusion site. It was unclear whether this was a spontaneous dissection versus related to the initial predilation balloon angioplasty. She underwent successful, but complex PCI of a long segment of the LAD using 2 overlapping Promus Premier DES stents as described incorporating balloon angioplasty, aspiration thrombectomy and intracoronary/IVUS with restoration of TIMI 2 flow after initial no reflow phenomenon. She also had moderate to significant proximal RCA disease in a large dominant RCA. During the procedure, she developed hypoxic respiratory failure, likely secondary to sedation plus or minus Pulmonary edema requiring emergent intubation by anesthesiology. She was placed on DAPT with ASA + Brilinta. She left the cath lab in stable condition. She was admitted to the CCU and critical care assisted with ventilation status. Post-cath, she developed atrial fibrillation and was placed on IV Amiodarone. She eventually converted to NSR and she was transitioned to PO Amiodarone. She was also on Levophed initially for hypotension. She was eventually weaned off the Levophed  and she was extubated. She had no further difficulties with BP and no further a-fib. A 2D echo on 10/10/13 demonstrated an EF of 35-40%, along with diskinesis of the  territory of the LAD. A low dose BB, as well as an ACE-I was initiated. She had no further pain. Cardiac rehab was consulted to assist with ambulation. She had little difficulty ambulating. It was decided to repeat an echo with contrast to see if she had any improvement in LV function, to see if a LifeVest was indicated, and to also rule out a thrombus. The echo was performed on 10/16/13 using Definity contrast.  Her EF improved to  40-45%. There was mid to distal anterior and apical hypokinesis. Definity contrast was administered and the left ventricular apexis well-visualized. There was no evidence for apical thrombus. Late definity uptake was seen in the myocardial layer of the ventricular septum, anterior wall and out to the apex, suggesting myocardial perfusion and viability in this territory. This suggest that her systolic function will continue improve. Based on this it was determined that she did not need a LifeVest and there was no need for oral anticoagulation. Prior to discharge, she was evaluated by PT/OT and it was recommended that she undergo rehab at a SNF. SW was consulted to assist with placement. On hospital day 8, she was last seen and examined by Dr. [name], who determined that she was stable for discharge. She was discharged home on ASA + Brilinta, Coreg, Lisinopril, Amiodarone and Lipitor. She was ordered to complete her week of 400 mg Amiodarone BID. She will then transition to 400 mg daily x 2 weeks, followed by 200 mg daily. Her Lipitor will also need to be decreased to 40 mg at time of office f/u.  She will need a BMP, LFTs and TSH checked prior to f/u. She will f/u with Corine Shelter, PA-C, on 10/24/12.    Consults: critical care  Significant Diagnostic Studies:   CT of chest 09/29/13 IMPRESSION:  1. No evidence of aortic dissection. Mild calcific atherosclerotic  disease more prominent within the abdomen. Calcification is most  prominent at the origin of the superior mesenteric  artery, with  perhaps mild associated luminal narrowing.  2. No central pulmonary embolus seen, though evaluation for  pulmonary embolus is limited given the phase of contrast  enhancement.  3. Mild bilateral dependent subsegmental atelectasis noted; lungs  otherwise clear.  4. Cholelithiasis; gallbladder otherwise unremarkable in appearance.  5. Scattered diverticulosis along the distal descending and sigmoid  colon, without evidence of diverticulitis.    Emergent LHC 10/09/13 POST-OPERATIVE DIAGNOSIS:  100% thrombotic occluded mid LAD associated dissection. It is unclear whether this was a spontaneous dissection versus related to the initial predilation balloon angioplasty. There is presence of intramural thrombus at the initial dissection site.  Successful complex PCI of a long segment of LAD using 2 overlapping Promus Premier DES stents as described incorporating balloon angioplasty, aspiration thrombectomy and intracoronary/IVUS with restoration of TIMI 2 flow after initial no reflow phenomenon.  Suspect that the patient's prolonged chest pain may very well have indicated prolonged LAD occlusion and therefore myocardial stunning leading to no reflow phenomenon after balloon angioplasty, dissection and intramural hematoma Moderate proximal LAD disease and moderate to significant proximal RCA disease in a large dominant RCA  Likely large anterior infarct, will confirm with echocardiogram, associated cardiogenic shock requiring vasopressor therapy  Hypoxic respiratory failure, likely secondary to sedation plus or minus Pulmonary edema requiring emergent intubation by anesthesiology  Moderate  sized right femoral hematoma and small to moderate right radial hematoma, both appear stable upon transfer to CCU.  Mixed respiratory and metabolic acidosis   2D echo w/ contrast 10/16/13 Study Conclusions  - HPI and indications: Limited study for LVEF. R/O apical thrombus. - Left ventricle: The  cavity size was normal. There was mild concentric hypertrophy. Systolic function was mildly to moderately reduced. The estimated ejection fraction was in the range of 40% to 45%. There is mid to distal anterior and apical hypokinesis. Definity contrast was administered and the left ventricular apexis well-visualized. There is no evidence for apical thrombus. Late definity uptake is seen in the myocardial layer of the ventricular septum,anterior wall andout to the apex, suggesting myocardial perfusion and viability in this territory.   Treatments: See Hospital Course  Discharge Exam: Blood pressure 116/65, pulse 70, temperature 97.5 F (36.4 C), temperature source Axillary, resp. rate 18, height 5\' 6"  (1.676 m), weight 100 kg (220 lb 7.4 oz), SpO2 99.00%.   Disposition: 01-Home or Self Care      Discharge Orders   Future Appointments Provider Department Dept Phone   10/24/2013 10:00 AM Eda Paschal [place], PA-C [facility] [phone]   Future Orders Complete By Expires   Amb Referral to Cardiac Rehabilitation  As directed    Diet - low sodium heart healthy  As directed    Increase activity slowly  As directed        Medication List    STOP taking these medications       atenolol 25 MG tablet  Commonly known as:  TENORMIN      TAKE these medications       acetaminophen 500 MG tablet  Commonly known as:  TYLENOL  Take 500-1,000 mg by mouth every 6 (six) hours as needed for mild pain.     amiodarone 400 MG tablet  Commonly known as:  PACERONE  Take 1 tablet (400 mg total) by mouth 2 (two) times daily.     amiodarone 200 MG tablet  Commonly known as:  PACERONE  Take 1 tablet (200 mg total) by mouth 2 (two) times daily.  Start taking on:  10/20/2013     amiodarone 200 MG tablet  Commonly known as:  PACERONE  Take 1 tablet (200 mg total) by mouth daily.  Start taking on:  11/04/2013     aspirin 81 MG chewable tablet  Chew 1 tablet (81 mg total) by mouth  daily.     atorvastatin 80 MG tablet  Commonly known as:  LIPITOR  Take 1 tablet (80 mg total) by mouth daily at 6 PM.     Biotin 1000 MCG tablet  Take 1,000 mcg by mouth daily.     calcium-vitamin D 500-200 MG-UNIT per tablet  Commonly known as:  OSCAL WITH D  Take 2 tablets by mouth daily.     carvedilol 6.25 MG tablet  Commonly known as:  COREG  Take 1 tablet (6.25 mg total) by mouth 2 (two) times daily with a meal.     cholecalciferol 1000 UNITS tablet  Commonly known as:  VITAMIN D  Take 1,000 Units by mouth daily.     clobetasol cream 0.05 %  Commonly known as:  TEMOVATE  Apply 1 application topically 2 (two) times daily.     clorazepate 3.75 MG tablet  Commonly known as:  TRANXENE  Take 0.9375 mg by mouth 2 (two) times daily as needed for anxiety.     famotidine 10 MG  tablet  Commonly known as:  PEPCID  Take 10 mg by mouth 2 (two) times daily as needed for heartburn or indigestion.     fexofenadine 60 MG tablet  Commonly known as:  ALLEGRA  Take 60 mg by mouth 2 (two) times daily as needed for allergies or rhinitis.     furosemide 20 MG tablet  Commonly known as:  LASIX  Take 1 tablet (20 mg total) by mouth 2 (two) times daily.     levothyroxine 100 MCG tablet  Commonly known as:  SYNTHROID, LEVOTHROID  Take 100 mcg by mouth daily before breakfast.     lisinopril 5 MG tablet  Commonly known as:  PRINIVIL,ZESTRIL  Take 1 tablet (5 mg total) by mouth daily.     multivitamin with minerals tablet  Take 1 tablet by mouth daily.     sertraline 50 MG tablet  Commonly known as:  ZOLOFT  Take 50 mg by mouth daily.     Ticagrelor 90 MG Tabs tablet  Commonly known as:  BRILINTA  Take 1 tablet (90 mg total) by mouth 2 (two) times daily.     Ticagrelor 90 MG Tabs tablet  Commonly known as:  BRILINTA  Take 1 tablet (90 mg total) by mouth 2 (two) times daily.     vitamin E 400 UNIT capsule  Generic drug:  vitamin E  Take 400 Units by mouth daily.        Follow-up Information   Follow up with Abelino Derrick, PA-C On 10/24/2013. (10:00 am )    Specialty:  Cardiology   Contact information:   [address] [place] [address] [phone]      TIME SPENT ON DISCHARGE, INCLUDING PHYSICIAN TIME: >30 MINUTES  Signed: Robbie Lis 10/17/2013, 10:23 AM  I have seen and evaluated the patient this AM along with Boyce Medici, PA. I agree with her findings, examination as well as impression recommendations.  Admitted overnight 12/23-24 with Anterior STEMI --> LAD occlusion with dissection --> complicated/difficult PCI (2 overlapping stents/aspiration thrombectomy, IVUS guided PTCA), Cardiogenic Shock--> Intubated & on pressors.  Pressors weaned over following ~1-2 days, then extubated.  Developed Afib - CVR --> Amiodarone --> converted, Amio now PO.  BP & HR improved & tolerating low dose BB+ACE-I.  Did require some Diuretics - now on PO; EF improved on f/u Echo.    Again looks great this AM. Ecchymoses stable to improving. No HF Sx.  Encouraging results from Echo with Definity -- improved EF 40-45%, therefore no need for LifeVest; improved distal Anterior (LAD) WM with definity uptake in myocardium to suggest viability in this region. ? Would expect continued improvement & would suggest successful revascularization.  Had loose stool yesterday after given bowel regimen for constipation. Now stable.  Tele stable - a few PVCs only.  Plan:  D/c to [name] today [facility] signed yesterday - but Echo not done-read in time for d/c).  On stable BB & ACE-I dose as well as baseline diuretic.  On DAPT - ASA + Brilinta, no bleeding (Hgb stable)  No further arrhythmias - Continue Amiodarone taper (400 mg bid x 2 more days, then 200 mg bid x 2 weeks, followed by 200 mg daily maintenance dose).  Will need Chem 10 + TSH prior to TCM f/u with APP next week.  F/u with Me ~4 wks  Phase 2 [facility] consult Continue Statin - but decrease to  40 mg on TCM f/u  MD Time with pt: 15 min  Marykay Lex, M.D., M.S. [facility] [address] [place], [place]  [address]  [phone] Pager # [phone] 10/17/2013 4:20 PM

## 2013-10-17 NOTE — Progress Notes (Signed)
Subjective: No complaints.   Objective: Vital signs in last 24 hours: Temp:  [97.5 F (36.4 C)-98.6 F (37 C)] 97.5 F (36.4 C) (12/30 0426) Pulse Rate:  [60-66] 66 (12/30 0426) Resp:  [16-24] 18 (12/30 0426) BP: (97-127)/(40-78) 127/50 mmHg (12/30 0426) SpO2:  [98 %-100 %] 99 % (12/30 0426) Last BM Date: 10/16/13  Intake/Output from previous day: 12/29 0701 - 12/30 0700 In: 480 [P.O.:480] Out: 6 [Stool:6] Intake/Output this shift:    Medications Current Facility-Administered Medications  Medication Dose Route Frequency Provider Last Rate Last Dose  . 0.9 %  sodium chloride infusion  250 mL Intravenous PRN Marykay Lex, MD   250 mL at 10/11/13 1600  . 0.9 %  sodium chloride infusion   Intravenous PRN Marykay Lex, MD      . acetaminophen (TYLENOL) tablet 650 mg  650 mg Oral Q4H PRN Marykay Lex, MD   650 mg at 10/16/13 0834  . ALPRAZolam Prudy Feeler) tablet 0.25 mg  0.25 mg Oral TID PRN Robbie Lis, PA-C   0.25 mg at 10/15/13 2207  . amiodarone (PACERONE) tablet 400 mg  400 mg Oral BID Melinda Simmons, PA-C   400 mg at 10/16/13 2134  . antiseptic oral rinse (BIOTENE) solution 15 mL  15 mL Mouth Rinse BID Nelda Bucks, MD   15 mL at 10/16/13 2134  . aspirin chewable tablet 81 mg  81 mg Oral Daily Marykay Lex, MD   81 mg at 10/16/13 1011  . atorvastatin (LIPITOR) tablet 80 mg  80 mg Oral q1800 Merwyn Katos, MD   80 mg at 10/16/13 1710  . carvedilol (COREG) tablet 6.25 mg  6.25 mg Oral BID WC Runell Gess, MD   6.25 mg at 10/16/13 1711  . docusate sodium (COLACE) capsule 100 mg  100 mg Oral BID PRN Melinda Simmons, PA-C   100 mg at 10/15/13 1759  . fentaNYL (SUBLIMAZE) injection 12.5-25 mcg  12.5-25 mcg Intravenous Q2H PRN Merwyn Katos, MD   25 mcg at 10/14/13 1358  . furosemide (LASIX) tablet 20 mg  20 mg Oral BID Melinda Simmons, PA-C   20 mg at 10/16/13 1711  . levothyroxine (SYNTHROID, LEVOTHROID) tablet 100 mcg  100 mcg Oral QAC  breakfast Marykay Lex, MD   100 mcg at 10/16/13 0806  . lisinopril (PRINIVIL,ZESTRIL) tablet 5 mg  5 mg Oral Daily Runell Gess, MD   5 mg at 10/16/13 1011  . loperamide (IMODIUM) capsule 2 mg  2 mg Oral BID PRN Marykay Lex, MD   2 mg at 10/16/13 1908  . menthol-cetylpyridinium (CEPACOL) lozenge 3 mg  1 lozenge Oral PRN Lonia Farber, MD      . ondansetron (ZOFRAN) injection 4 mg  4 mg Intravenous Q6H PRN Marykay Lex, MD   4 mg at 10/13/13 2044  . senna (SENOKOT) tablet 8.6 mg  1 tablet Oral Daily PRN Melinda Simmons, PA-C   8.6 mg at 10/15/13 1759  . sodium chloride 0.9 % injection 3 mL  3 mL Intravenous Q12H Marykay Lex, MD   3 mL at 10/16/13 2135  . sodium chloride 0.9 % injection 3 mL  3 mL Intravenous PRN Marykay Lex, MD   3 mL at 10/15/13 1012  . sodium phosphate (FLEET) 7-19 GM/118ML enema 1 enema  1 enema Rectal Daily PRN Melinda Simmons, PA-C      . Ticagrelor (BRILINTA) tablet 90 mg  90 mg Oral BID  Marykay Lex, MD   90 mg at 10/16/13 2134   Facility-Administered Medications Ordered in Other Encounters  Medication Dose Route Frequency Provider Last Rate Last Dose  . etomidate (AMIDATE) injection    Anesthesia Intra-op Hart Robinsons, MD   14 mg at 10/10/13 0105  . rocuronium [facility]) injection    Anesthesia Intra-op Hart Robinsons, MD   50 mg at 10/10/13 0110  . succinylcholine (ANECTINE) injection    Anesthesia Intra-op Hart Robinsons, MD   100 mg at 10/10/13 0105    PE: General appearance: alert, cooperative and no distress Lungs: clear to auscultation bilaterally Heart: regular rate and rhythm, S1, S2 normal, no murmur, click, rub or gallop Extremities: no LEE, mild ecchymosis of the wrist and groin Pulses: 2+ and symmetric Skin: warm and dry Neurologic: Grossly normal  Lab Results:   Recent Labs  10/15/13 0500 10/16/13 0555 10/17/13 0620  WBC 8.7 9.1 9.2  HGB 9.9* 9.6* 10.0*  HCT 30.5* 29.4* 31.2*  PLT 165 182 203     BMET  Recent Labs  10/15/13 0500 10/16/13 0555 10/17/13 0620  NA 139 138 141  K 3.4* 3.7 4.5  CL 104 104 104  CO2 25 25 26   GLUCOSE 105* 101* 103*  BUN 22 22 21   CREATININE 0.79 0.81 0.86  CALCIUM 9.2 9.2 9.3   Studies/Results:  2D echo 10/16/13 Study Conclusions  - HPI and indications: Limited study for LVEF. R/O apical thrombus. - Left ventricle: The cavity size was normal. There was mild concentric hypertrophy. Systolic function was mildly to moderately reduced. The estimated ejection fraction was in the range of 40% to 45%. There is mid to distal anterior and apical hypokinesis. Definity contrast was administered and the left ventricular apexis well-visualized. There is no evidence for apical thrombus. Late definity uptake is seen in the myocardial layer of the ventricular septum,anterior wall andout to the apex, suggesting myocardial perfusion and viability in this territory.  Assessment/Plan  Principal Problem:   STEMI 10/09/13 Active Problems:   Cardiogenic shock- resolved   Presence of drug coated stent in LAD coronary artery - 2 overlapping Promx DES 2.5 x 38, 2.5 x 12 (postdilated to ~3 mm)   Metabolic acidosis- resolved   Acute respiratory failure due to hypoxia from acute pulmonary edema- resolved   Acute pulmonary edema with congestive heart failure - due to Anterior STEMI- resolved   Hx of HTN   Cardiomyopathy, ischemic- 30-35% on admission 10/09/13   Dyslipidemia- LDL 112   PAF- peri-MI. NSR on Amio at discharge  Plan: 2D echo w/ contrast yesterday demonstrated an improved EF of 40-45% (compared to 35-40% on 12/23). There was mid to distal anterior and apical hypokinesis. Definity contrast was administered and the left ventricular apexis well-visualized. There was no evidence for apical thrombus. Late definity uptake was seen in the myocardial layer of the ventricular septum, anterior wall and out to the apex, suggesting myocardial perfusion and viability in  this territory. This suggest that her systolic function will continue improve. Will consider repeating an echo in our office in 3 months. HR and BP both stable. NSR with a few PVCs on telemetry. Labs ok. Will plan of d/c to rehab facility today. Will d/c on PO amiodarone, ASA, Brilinta, Coreg, Lisinopril and Lipitor. Will arrange post-hospital f/u with Dr. [name].      LOS: 8 days    Melinda Noble 10/17/2013 8:32 AM  I have seen and evaluated the patient this AM along with [place]  Sharol Harness, [place]. I agree with her findings, examination as well as impression recommendations.  Again looks great this AM.  Ecchymoses stable to improving.  No HF Sx. Encouraging results from Echo with Definity -- improved EF 40-45%, therefore no need for LifeVest; improved distal Anterior (LAD) WM with definity uptake in myocardium to suggest viability in this region.  ? Would expect continued improvement & would suggest successful revascularization. Had loose stool yesterday after given bowel regimen for constipation.  Now stable.  Tele stable - a few PVCs only.  Plan: D/c to [name] today [facility] signed yesterday - but Echo not done-read in time for d/c). On stable BB & ACE-I dose as well as baseline diuretic. On DAPT - ASA + Brilinta, no bleeding (Hgb stable) No further arrhythmias - Continue Amiodarone taper (400 mg bid x 2 more days, then 200 mg bid x 2 weeks, followed by 200 mg daily maintenance dose).  Will need Chem 10 + TSH prior to TCM f/u with APP next week.   F/u with Me ~4 wks  Phase 2 [facility] consult  Continue Statin - but decrease to 40 mg on TCM f/u   MD Time with pt: 15 min  Esten Dollar W, M.D., M.S. [facility] [address] [place], [place]  [address]  [phone] Pager # [phone] 10/17/2013 9:48 AM

## 2013-10-18 ENCOUNTER — Other Ambulatory Visit: Payer: Self-pay | Admitting: *Deleted

## 2013-10-18 ENCOUNTER — Encounter: Payer: Self-pay | Admitting: Geriatric Medicine

## 2013-10-18 ENCOUNTER — Telehealth (HOSPITAL_COMMUNITY): Payer: Self-pay | Admitting: Cardiology

## 2013-10-18 ENCOUNTER — Ambulatory Visit: Payer: Medicare Other | Admitting: Cardiology

## 2013-10-18 ENCOUNTER — Non-Acute Institutional Stay (SKILLED_NURSING_FACILITY): Payer: Medicare Other | Admitting: Geriatric Medicine

## 2013-10-18 DIAGNOSIS — F411 Generalized anxiety disorder: Secondary | ICD-10-CM | POA: Diagnosis not present

## 2013-10-18 DIAGNOSIS — I4891 Unspecified atrial fibrillation: Secondary | ICD-10-CM | POA: Diagnosis not present

## 2013-10-18 DIAGNOSIS — I480 Paroxysmal atrial fibrillation: Secondary | ICD-10-CM

## 2013-10-18 DIAGNOSIS — R279 Unspecified lack of coordination: Secondary | ICD-10-CM | POA: Diagnosis not present

## 2013-10-18 DIAGNOSIS — M6281 Muscle weakness (generalized): Secondary | ICD-10-CM | POA: Diagnosis not present

## 2013-10-18 DIAGNOSIS — I219 Acute myocardial infarction, unspecified: Secondary | ICD-10-CM | POA: Diagnosis not present

## 2013-10-18 DIAGNOSIS — I10 Essential (primary) hypertension: Secondary | ICD-10-CM | POA: Diagnosis not present

## 2013-10-18 DIAGNOSIS — I213 ST elevation (STEMI) myocardial infarction of unspecified site: Secondary | ICD-10-CM

## 2013-10-18 DIAGNOSIS — R269 Unspecified abnormalities of gait and mobility: Secondary | ICD-10-CM | POA: Diagnosis not present

## 2013-10-18 MED ORDER — CLORAZEPATE DIPOTASSIUM 3.75 MG PO TABS: ORAL_TABLET | ORAL | Status: DC

## 2013-10-18 NOTE — Telephone Encounter (Signed)
Spoke with patient, as well as her husband, over the phone. She has been doing well. No problems/symptoms since dischage. No questions regarding her medications. She is aware of her f/u office visit on 10/24/13. she had no questions for me.   BRITTAINY SIMMONS, PA-C

## 2013-10-18 NOTE — Assessment & Plan Note (Signed)
Patient reports she as been taking low dose Tranxene "for years",during hospitalization, was given alprazolam with good result. Spouse requested this medication be prescribed during Rehab stay.

## 2013-10-18 NOTE — Assessment & Plan Note (Signed)
Heart remains in regular rhythm, rate satisfactory. Continue amiodarone as prescribed with first dose reduction due January 2.

## 2013-10-18 NOTE — Assessment & Plan Note (Addendum)
No CP, mild ftaigue, low stamina. Will benefit from Rehab environment, PT/OT to regain independent functional status. Anticipate Rehab stay 7-10 days. Followup with cardiology as scheduled January 6.

## 2013-10-18 NOTE — Assessment & Plan Note (Signed)
BP well controlled.

## 2013-10-18 NOTE — Progress Notes (Signed)
Patient ID: Melinda Noble, female   DOB: September 19, 1930, 79 y.o.   MRN: [id number]   [facility] [id number])  Code Status: Full Code  Contact Information   Name Relation Home Work Mobile   [place] Spouse [phone]  [phone]   Kariya, Lavergne Daughter   [phone]      Chief Complaint  Patient presents with  . Hospitalization Follow-up    MI    HPI:  This is a 79 y.o. female resident of [name], Independent Living  section.  She was admitted to hospital 10/09/2013 with STEMI. There was initial concern for possible aortic dissection but this was ruled out. Patient was transported to the cardiac Cath Lab where she underwent angioplasty and complex PCI of a long segment of the LAD. Procedure was complicated by hypoxic respiratory failure requiring intubation. Following procedure patient was transferred to the intensive care unit where hypotension and atrial fibrillation were treated. Patient stabilized, atrial fibrillation resolved, she was extubated. 2-D echo on 10/10/2013 demonstrated EF of 35-40% along with dyskinesis of the territory of the LAD. Patient was started on low-dose beta blocker as well as ACE inhibitor. Cardiac rehabilitation was initiated. Repeat echocardiogram on 10/16/2013 showed improved EF to 40-45%. The patient continued to improve and was ready for discharge to skilled rehabilitation section at [name] on 10/17/2013.    Allergies  Allergen Reactions  . Iohexol      Code: HIVES, Desc: anaphylactic shock s/p contrast injection many yrs ago--suggested that pt NEVER have iv contrast//a.c., Onset Date: [id number]   . Penicillins Nausea Only  . Sulfa Antibiotics Rash   Medications: Patient's Medications  New Prescriptions   No medications on file  Previous Medications   ACETAMINOPHEN (TYLENOL) 500 MG TABLET    Take 500-1,000 mg by mouth every 6 (six) hours as needed for mild pain.   ALPRAZOLAM (XANAX) 0.25 MG TABLET     Take 0.25 mg by mouth 3 (three) times daily as needed for anxiety.   AMIODARONE (PACERONE) 200 MG TABLET    Take 1 tablet (200 mg total) by mouth 2 (two) times daily.   AMIODARONE (PACERONE) 200 MG TABLET    Take 1 tablet (200 mg total) by mouth daily.   AMIODARONE (PACERONE) 400 MG TABLET    Take 1 tablet (400 mg total) by mouth 2 (two) times daily.   ASPIRIN 81 MG CHEWABLE TABLET    Chew 1 tablet (81 mg total) by mouth daily.   ATORVASTATIN (LIPITOR) 80 MG TABLET    Take 1 tablet (80 mg total) by mouth daily at 6 PM.   BIOTIN 1000 MCG TABLET    Take 1,000 mcg by mouth daily.   CALCIUM-VITAMIN D (OSCAL WITH D) 500-200 MG-UNIT PER TABLET    Take 2 tablets by mouth daily.   CARVEDILOL (COREG) 6.25 MG TABLET    Take 1 tablet (6.25 mg total) by mouth 2 (two) times daily with a meal.   CHOLECALCIFEROL (VITAMIN D) 1000 UNITS TABLET    Take 1,000 Units by mouth daily.   CLOBETASOL CREAM (TEMOVATE) 0.05 %    Apply 1 application topically 2 (two) times daily.   CLORAZEPATE (TRANXENE) 3.75 MG TABLET    Take 1/4 tablet by mouth twice daily as needed for anxiety   FAMOTIDINE (PEPCID) 10 MG TABLET    Take 10 mg by mouth 2 (two) times daily as needed for heartburn or indigestion.   FEXOFENADINE (ALLEGRA) 60 MG TABLET    Take 60 mg  by mouth 2 (two) times daily as needed for allergies or rhinitis.   FUROSEMIDE (LASIX) 20 MG TABLET    Take 1 tablet (20 mg total) by mouth 2 (two) times daily.   LEVOTHYROXINE (SYNTHROID, LEVOTHROID) 100 MCG TABLET    Take 100 mcg by mouth daily before breakfast.   LISINOPRIL (PRINIVIL,ZESTRIL) 5 MG TABLET    Take 1 tablet (5 mg total) by mouth daily.   MULTIPLE VITAMINS-MINERALS (MULTIVITAMIN WITH MINERALS) TABLET    Take 1 tablet by mouth daily.   SERTRALINE (ZOLOFT) 50 MG TABLET    Take 50 mg by mouth daily.   TICAGRELOR (BRILINTA) 90 MG TABS TABLET    Take 1 tablet (90 mg total) by mouth 2 (two) times daily.   TICAGRELOR (BRILINTA) 90 MG TABS TABLET    Take 1 tablet (90 mg  total) by mouth 2 (two) times daily.   VITAMIN E (VITAMIN E) 400 UNIT CAPSULE    Take 400 Units by mouth daily.  Modified Medications   No medications on file  Discontinued Medications   No medications on file    DATA REVIEWED  Radiological:   Cardiovascular 10/10/2013 Transthoracic Echocardiography Study Conclusions - Left ventricle: The cavity size was normal. There was mild   concentric hypertrophy, with an appearance suggesting   concentric remodeling (increased wall thickness with   normal wall mass). Systolic function was moderately reduced. The estimated ejection fraction was in the range   of 35% to 40%. Doppler parameters are consistent with abnormal left ventricular relaxation (grade 1 diastolic dysfunction). Doppler parameters are consistent with   elevated mean left atrial filling pressure. - Regional wall motion abnormality: Dyskinesis of the apical septal and apical myocardium; akinesis of the mid  anteroseptal and apical inferior myocardium; severe   hypokinesis of the apical anterior myocardium; moderate hypokinesis of the mid inferolateral and apical lateral  myocardium; mild hypokinesis of the mid inferoseptal   myocardium. - Aortic valve: Trivial regurgitation. - Left atrium: The atrium was moderately dilated.  10/12/2013 Bilateral Lower Extremity Venous Duplex Evaluation Summary: - No evidence of deep vein thrombosis involving the right  lower extremity. - No evidence of deep vein thrombosis involving the left  lower extremity. - No evidence of Baker's cyst on the right or left.  10/16/2013 Transthoracic Echocardiography  -Study Conclusions - HPI and indications: Limited study for LVEF. R/O apical  thrombus. - Left ventricle: The cavity size was normal. There was mild concentric hypertrophy. Systolic function was mildly to moderately reduced. The estimated ejection fraction was in the range of 40% to 45%. There is mid to distal anterior and apical  hypokinesis. Definity contrast was administered  and the left ventricular apexis well-visualized. There is no evidence for apical thrombus. Late definity uptake is seen in the myocardial layer of the ventricular   septum,anterior wall andout to the apex, suggesting  myocardial perfusion and viability in this territory.  Laboratory: Hospital  Recent Labs  10/09/13 2220 10/10/13 0430  10/15/13 0500 10/16/13 0555 10/17/13 0620  NA 136 137  < > 139 138 141  K 3.8 4.0  < > 3.4* 3.7 4.5  CL 102 104  < > 104 104 104  CO2 22 20  < > 25 25 26   GLUCOSE 147* 345*  < > 105* 101* 103*  BUN 26* 22  < > 22 22 21   CREATININE 0.87 0.70  < > 0.79 0.81 0.86  CALCIUM 10.3 8.7  < > 9.2 9.2 9.3  MG  --  1.7  --   --   --   --   < > = values in this interval not displayed. Liver Function Tests:  Recent Labs  10/11/13 0500  AST 202*  ALT 43*  ALKPHOS 69  BILITOT 0.5  PROT 5.5*  ALBUMIN 2.9*   No results found for this basename: LIPASE, AMYLASE,  in the last 8760 hours No results found for this basename: AMMONIA,  in the last 8760 hours CBC:  Recent Labs  10/10/13 1400 10/11/13 0500  10/15/13 0500 10/16/13 0555 10/17/13 0620  WBC 21.7* 17.6*  < > 8.7 9.1 9.2  NEUTROABS  --  13.2*  --   --   --   --   HGB 12.3 10.6*  < > 9.9* 9.6* 10.0*  HCT 36.8 32.7*  < > 30.5* 29.4* 31.2*  MCV 92.0 93.7  < > 93.8 94.5 94.8  PLT 243 216  < > 165 182 203  < > = values in this interval not displayed. Cardiac Enzymes:  Recent Labs  10/10/13 0800 10/10/13 1530 10/10/13 2215 10/11/13 0500 10/12/13 1057  CKTOTAL 3116* 2469* 1464*  --   --   CKMB >500.0* 376.4* 178.2*  --   --   TROPONINI >20.00* >20.00*  --  >20.00* 15.19*   BNP: No components found with this basename: POCBNP,  CBG:  Recent Labs  10/11/13 1116 10/15/13 2128 10/16/13 0740  GLUCAP 108* 119* 99     Past Medical History  Diagnosis Date  . Hypertension   . Migraine headache   . Thyroid disease   . Depression   . Acute  respiratory failure due to hypoxia 10/10/2013    secondary to anterior STEMI related pulmonary edema (resolved)  . ST elevation (STEMI) myocardial infarction involving left anterior descending coronary artery 10/09/13    Mid LAD occulsion s/p PCI (extensive disection)  . CAD S/P percutaneous coronary angioplasty 10/09/13    DES x 2 to Mid LAD, 2.5 x 38, 2.5 x 12 Promus (covering extensive disection), moderate RCA disesase  . Ischemic cardiomyopathy 10/16/13    Repeat Echo EF 40-45% with distal LAD hypokenesis improved from initial, post STEMI   Past Surgical History  Procedure Laterality Date  . Shoulder surgery    . Coronary angioplasty with stent placement  10/09/13    2 overlapping Promus DES to mid LAD, occlusion/dissection (2.5 x 38, 2.5 x 12)   Social History:   History   Social History Narrative   Patient is Married, since 1958 Molly Maduro)   Lives in apartment, Independent Living section [facility] since 04[facility].   Stopped Smoking many years ago, minimal aalcohol history.    Regular exercise includes swimming 3d/week,   Patient has no Advanced planning documents                   Review of Systems:  DATA OBTAINED: from patient GENERAL: Feels tired, weak, not much appetite   SKIN: No itch, rash or open wounds EYES: No eye pain, dryness or itching  No change in vision EARS: No earache, tinnitus, change in hearing NOSE: No congestion, drainage or bleeding MOUTH/THROAT: No mouth or tooth pain  No sore throat   No difficulty chewing or swallowing RESPIRATORY: No cough, wheezing, Mild SOB w/ activity CARDIAC: No chest pain, palpitations  No edema. GI: No abdominal pain  No N/V/D or constipation  No heartburn or reflux  GU: No dysuria, frequency or urgency  No change in urine volume or  character  MUSCULOSKELETAL: No joint pain, swelling or stiffness  No back pain  No muscle ache, pain,  Gait is steady  No recent falls.  NEUROLOGIC: No dizziness,  fainting, headache,  PSYCHIATRIC: No feelings of anxiety, depression Sleeps well.     Physical Exam: Filed Vitals:   10/18/13 1500  BP: 129/73  Pulse: 60  Temp: 97.1 F (36.2 C)  Resp: 17  Height: 5\' 6"  (1.676 m)  Weight: 210 lb (95.255 kg)  SpO2: 96%  Body mass index is 33.91 kg/(m^2).  GENERAL APPEARANCE: No acute distress, appropriately groomed, Overweight body habitus. Alert, pleasant, conversant. SKIN: No diaphoresis, rash, unusual lesions, wounds HEAD: Normocephalic, atraumatic EYES: Conjunctiva/lids clear. Pupils round, reactive. EOMs intact.  EARS: External exam WNL, Hearing grossly normal. NOSE: No deformity or discharge. MOUTH/THROAT: Lips w/o lesions. Oral mucosa, tongue moist, w/o lesion. Oropharynx w/o redness or lesions.  NECK: Supple, full ROM. No thyroid tenderness, enlargement or nodule LYMPHATICS: No head, neck or supraclavicular adenopathy RESPIRATORY: Breathing is even, unlabored. Lung sounds are clear and full.  CARDIOVASCULAR: Heart RRR. No murmur or extra heart sounds  ARTERIAL: No carotid bruit.  DP pulse 2+.  VENOUS: No varicosities. No venous stasis skin changes  EDEMA: No peripheral or periorbital edema. GASTROINTESTINAL: Abdomen is soft, non-tender, not distended w/ normal bowel sounds.  MUSCULOSKELETAL: Moves all extremities with full ROM, strength and tone. Back is without kyphosis, scoliosis or spinal process tenderness.  NEUROLOGIC: Oriented to time, place, person. Cranial nerves 2-12 grossly intact, speech clear, no tremor.  PSYCHIATRIC: Mood and affect appropriate to situation   Assessment/Plan   STEMI 10/09/13 No CP, mild ftaigue, low stamina. Will benefit from Rehab environment, PT/OT to regain independent functional status. Anticipate Rehab stay 7-10 days. Followup with cardiology as scheduled January 6.  Generalized anxiety disorder Patient reports she as been taking low dose Tranxene "for years",during hospitalization, was given  alprazolam with good result. Spouse requested this medication be prescribed during Rehab stay.   Hx of HTN BP well controlled  PAF- peri-MI. NSR on Amio at discharge Heart remains in regular rhythm, rate satisfactory. Continue amiodarone as prescribed with first dose reduction due January 2.   Goals of care:   [place].     Labs/tests ordered: BMP, LFTs TSH   Follow up: As needed  Zuriah Bordas T.Markeia Harkless, NP-C 10/18/2013

## 2013-10-18 NOTE — Telephone Encounter (Signed)
Made first attempt for TCM phone call. Will try again later.   Ceaira Ernster, PA-C

## 2013-10-20 DIAGNOSIS — I2119 ST elevation (STEMI) myocardial infarction involving other coronary artery of inferior wall: Secondary | ICD-10-CM | POA: Diagnosis not present

## 2013-10-20 DIAGNOSIS — R0602 Shortness of breath: Secondary | ICD-10-CM | POA: Diagnosis not present

## 2013-10-20 DIAGNOSIS — R269 Unspecified abnormalities of gait and mobility: Secondary | ICD-10-CM | POA: Diagnosis not present

## 2013-10-20 DIAGNOSIS — R279 Unspecified lack of coordination: Secondary | ICD-10-CM | POA: Diagnosis not present

## 2013-10-20 DIAGNOSIS — R079 Chest pain, unspecified: Secondary | ICD-10-CM | POA: Diagnosis not present

## 2013-10-20 DIAGNOSIS — M6281 Muscle weakness (generalized): Secondary | ICD-10-CM | POA: Diagnosis not present

## 2013-10-22 DIAGNOSIS — N390 Urinary tract infection, site not specified: Secondary | ICD-10-CM | POA: Diagnosis not present

## 2013-10-23 ENCOUNTER — Encounter: Payer: Self-pay | Admitting: Geriatric Medicine

## 2013-10-23 DIAGNOSIS — R0602 Shortness of breath: Secondary | ICD-10-CM | POA: Diagnosis not present

## 2013-10-23 DIAGNOSIS — I2119 ST elevation (STEMI) myocardial infarction involving other coronary artery of inferior wall: Secondary | ICD-10-CM | POA: Diagnosis not present

## 2013-10-23 DIAGNOSIS — I10 Essential (primary) hypertension: Secondary | ICD-10-CM | POA: Diagnosis not present

## 2013-10-23 DIAGNOSIS — M6281 Muscle weakness (generalized): Secondary | ICD-10-CM | POA: Diagnosis not present

## 2013-10-23 DIAGNOSIS — R079 Chest pain, unspecified: Secondary | ICD-10-CM | POA: Diagnosis not present

## 2013-10-23 DIAGNOSIS — I2510 Atherosclerotic heart disease of native coronary artery without angina pectoris: Secondary | ICD-10-CM | POA: Diagnosis not present

## 2013-10-23 DIAGNOSIS — R269 Unspecified abnormalities of gait and mobility: Secondary | ICD-10-CM | POA: Diagnosis not present

## 2013-10-23 DIAGNOSIS — Z79899 Other long term (current) drug therapy: Secondary | ICD-10-CM | POA: Diagnosis not present

## 2013-10-23 DIAGNOSIS — R279 Unspecified lack of coordination: Secondary | ICD-10-CM | POA: Diagnosis not present

## 2013-10-23 LAB — LIPID PANEL
CHOLESTEROL: 98 mg/dL (ref 0–200)
HDL: 33 mg/dL — AB (ref 35–70)
LDL CALC: 46 mg/dL
Triglycerides: 130 mg/dL (ref 40–160)

## 2013-10-23 LAB — BASIC METABOLIC PANEL
BUN: 18 mg/dL (ref 4–21)
Creatinine: 0.8 mg/dL (ref 0.5–1.1)
GLUCOSE: 88 mg/dL
Potassium: 3.8 mmol/L (ref 3.4–5.3)
Sodium: 136 mmol/L — AB (ref 137–147)

## 2013-10-23 LAB — TSH: TSH: 3.59 u[IU]/mL (ref 0.41–5.90)

## 2013-10-24 ENCOUNTER — Encounter (HOSPITAL_COMMUNITY): Payer: Self-pay | Admitting: Emergency Medicine

## 2013-10-24 ENCOUNTER — Inpatient Hospital Stay (HOSPITAL_COMMUNITY)
Admission: EM | Admit: 2013-10-24 | Discharge: 2013-10-25 | DRG: 312 | Disposition: A | Discharge status: Home / Self Care | Payer: Medicare Other | Attending: Cardiology | Admitting: Cardiology

## 2013-10-24 ENCOUNTER — Ambulatory Visit (INDEPENDENT_AMBULATORY_CARE_PROVIDER_SITE_OTHER): Payer: Medicare Other | Admitting: Cardiovascular Disease

## 2013-10-24 ENCOUNTER — Encounter: Payer: Self-pay | Admitting: Cardiology

## 2013-10-24 VITALS — BP 100/56 | Ht 66.0 in | Wt 208.0 lb

## 2013-10-24 DIAGNOSIS — R079 Chest pain, unspecified: Secondary | ICD-10-CM | POA: Diagnosis not present

## 2013-10-24 DIAGNOSIS — Z9861 Coronary angioplasty status: Secondary | ICD-10-CM

## 2013-10-24 DIAGNOSIS — I252 Old myocardial infarction: Secondary | ICD-10-CM

## 2013-10-24 DIAGNOSIS — R6889 Other general symptoms and signs: Secondary | ICD-10-CM | POA: Diagnosis not present

## 2013-10-24 DIAGNOSIS — I951 Orthostatic hypotension: Secondary | ICD-10-CM | POA: Diagnosis not present

## 2013-10-24 DIAGNOSIS — Z955 Presence of coronary angioplasty implant and graft: Secondary | ICD-10-CM

## 2013-10-24 DIAGNOSIS — R269 Unspecified abnormalities of gait and mobility: Secondary | ICD-10-CM | POA: Diagnosis not present

## 2013-10-24 DIAGNOSIS — I480 Paroxysmal atrial fibrillation: Secondary | ICD-10-CM

## 2013-10-24 DIAGNOSIS — E039 Hypothyroidism, unspecified: Secondary | ICD-10-CM | POA: Diagnosis present

## 2013-10-24 DIAGNOSIS — I4891 Unspecified atrial fibrillation: Secondary | ICD-10-CM

## 2013-10-24 DIAGNOSIS — R570 Cardiogenic shock: Secondary | ICD-10-CM | POA: Diagnosis not present

## 2013-10-24 DIAGNOSIS — R279 Unspecified lack of coordination: Secondary | ICD-10-CM | POA: Diagnosis not present

## 2013-10-24 DIAGNOSIS — I255 Ischemic cardiomyopathy: Secondary | ICD-10-CM

## 2013-10-24 DIAGNOSIS — I2589 Other forms of chronic ischemic heart disease: Secondary | ICD-10-CM | POA: Diagnosis not present

## 2013-10-24 DIAGNOSIS — D649 Anemia, unspecified: Secondary | ICD-10-CM | POA: Diagnosis present

## 2013-10-24 DIAGNOSIS — M6281 Muscle weakness (generalized): Secondary | ICD-10-CM | POA: Diagnosis not present

## 2013-10-24 DIAGNOSIS — R0602 Shortness of breath: Secondary | ICD-10-CM | POA: Diagnosis not present

## 2013-10-24 DIAGNOSIS — I219 Acute myocardial infarction, unspecified: Secondary | ICD-10-CM

## 2013-10-24 DIAGNOSIS — I2119 ST elevation (STEMI) myocardial infarction involving other coronary artery of inferior wall: Secondary | ICD-10-CM | POA: Diagnosis not present

## 2013-10-24 DIAGNOSIS — I959 Hypotension, unspecified: Secondary | ICD-10-CM | POA: Diagnosis not present

## 2013-10-24 DIAGNOSIS — I213 ST elevation (STEMI) myocardial infarction of unspecified site: Secondary | ICD-10-CM

## 2013-10-24 LAB — COMPREHENSIVE METABOLIC PANEL
ALT: 27 U/L (ref 0–35)
AST: 29 U/L (ref 0–37)
Albumin: 3.5 g/dL (ref 3.5–5.2)
Alkaline Phosphatase: 107 U/L (ref 39–117)
BUN: 19 mg/dL (ref 6–23)
CO2: 27 mEq/L (ref 19–32)
Calcium: 10.2 mg/dL (ref 8.4–10.5)
Chloride: 102 mEq/L (ref 96–112)
Creatinine, Ser: 0.98 mg/dL (ref 0.50–1.10)
GFR calc Af Amer: 61 mL/min — ABNORMAL LOW (ref >90)
GFR calc non Af Amer: 53 mL/min — ABNORMAL LOW (ref >90)
Glucose, Bld: 107 mg/dL — ABNORMAL HIGH (ref 70–99)
Potassium: 3.9 mEq/L (ref 3.7–5.3)
Sodium: 140 mEq/L (ref 137–147)
Total Bilirubin: 0.7 mg/dL (ref 0.3–1.2)
Total Protein: 7 g/dL (ref 6.0–8.3)

## 2013-10-24 LAB — URINALYSIS, ROUTINE W REFLEX MICROSCOPIC
Bilirubin Urine: NEGATIVE
Glucose, UA: NEGATIVE mg/dL
Hgb urine dipstick: NEGATIVE
Ketones, ur: NEGATIVE mg/dL
Leukocytes, UA: NEGATIVE
Nitrite: NEGATIVE
Protein, ur: NEGATIVE mg/dL
Specific Gravity, Urine: 1.01 (ref 1.005–1.030)
Urobilinogen, UA: 1 mg/dL (ref 0.0–1.0)
pH: 6.5 (ref 5.0–8.0)

## 2013-10-24 LAB — CBC
HCT: 32.5 % — ABNORMAL LOW (ref 36.0–46.0)
Hemoglobin: 10.8 g/dL — ABNORMAL LOW (ref 12.0–15.0)
MCH: 31 pg (ref 26.0–34.0)
MCHC: 33.2 g/dL (ref 30.0–36.0)
MCV: 93.4 fL (ref 78.0–100.0)
Platelets: ADEQUATE 10*3/uL (ref 150–400)
RBC: 3.48 MIL/uL — ABNORMAL LOW (ref 3.87–5.11)
RDW: 15.1 % (ref 11.5–15.5)
WBC: 10.3 10*3/uL (ref 4.0–10.5)

## 2013-10-24 LAB — TSH: TSH: 3.069 u[IU]/mL (ref 0.350–4.500)

## 2013-10-24 LAB — MRSA PCR SCREENING: MRSA BY PCR: NEGATIVE

## 2013-10-24 MED ORDER — ACETAMINOPHEN 325 MG PO TABS
650.0000 mg | ORAL_TABLET | Freq: Four times a day (QID) | ORAL | Status: DC | PRN
  Administered 2013-10-24 – 2013-10-25 (×2): 650 mg via ORAL
  Filled 2013-10-24 (×2): qty 2

## 2013-10-24 MED ORDER — PANTOPRAZOLE SODIUM 40 MG PO TBEC
40.0000 mg | DELAYED_RELEASE_TABLET | Freq: Every day | ORAL | Status: DC
  Administered 2013-10-25: 40 mg via ORAL
  Filled 2013-10-24: qty 1

## 2013-10-24 MED ORDER — ATORVASTATIN CALCIUM 40 MG PO TABS
40.0000 mg | ORAL_TABLET | Freq: Every day | ORAL | Status: DC
  Filled 2013-10-24: qty 1

## 2013-10-24 MED ORDER — CARVEDILOL 3.125 MG PO TABS
3.1250 mg | ORAL_TABLET | Freq: Two times a day (BID) | ORAL | Status: DC
  Administered 2013-10-24 – 2013-10-25 (×2): 3.125 mg via ORAL
  Filled 2013-10-24 (×5): qty 1

## 2013-10-24 MED ORDER — AMIODARONE HCL 200 MG PO TABS
200.0000 mg | ORAL_TABLET | Freq: Two times a day (BID) | ORAL | Status: DC
  Administered 2013-10-24 – 2013-10-25 (×3): 200 mg via ORAL
  Filled 2013-10-24 (×5): qty 1

## 2013-10-24 MED ORDER — TICAGRELOR 90 MG PO TABS
90.0000 mg | ORAL_TABLET | Freq: Two times a day (BID) | ORAL | Status: DC
Start: 2013-10-24 — End: 2013-10-25
  Administered 2013-10-24 – 2013-10-25 (×2): 90 mg via ORAL
  Filled 2013-10-24 (×4): qty 1

## 2013-10-24 MED ORDER — SODIUM CHLORIDE 0.9 % IV SOLN
INTRAVENOUS | Status: DC
  Administered 2013-10-24 – 2013-10-25 (×2): via INTRAVENOUS

## 2013-10-24 MED ORDER — ASPIRIN 81 MG PO CHEW
81.0000 mg | CHEWABLE_TABLET | Freq: Once | ORAL | Status: AC
  Administered 2013-10-24: 81 mg via ORAL
  Filled 2013-10-24: qty 1

## 2013-10-24 MED ORDER — LEVOTHYROXINE SODIUM 100 MCG PO TABS
100.0000 ug | ORAL_TABLET | Freq: Every day | ORAL | Status: DC
  Administered 2013-10-25: 100 ug via ORAL
  Filled 2013-10-24 (×3): qty 1

## 2013-10-24 MED ORDER — SERTRALINE HCL 50 MG PO TABS
50.0000 mg | ORAL_TABLET | Freq: Every day | ORAL | Status: DC
Start: 2013-10-24 — End: 2013-10-25
  Administered 2013-10-24: 50 mg via ORAL
  Filled 2013-10-24 (×3): qty 1

## 2013-10-24 MED ORDER — CLORAZEPATE DIPOTASSIUM 3.75 MG PO TABS
3.7500 mg | ORAL_TABLET | Freq: Two times a day (BID) | ORAL | Status: DC | PRN
  Administered 2013-10-25: 3.75 mg via ORAL
  Filled 2013-10-24: qty 1

## 2013-10-24 NOTE — ED Notes (Signed)
Per EMS - pt coming from cardiologist, went there for a follow up appointment. Pt had a recent STEMI and stent placed. While at office she was found to have a BP 67/44 lying then sitting up 789 systolic. 12 lead showing 1st degree heart block, no elevation. Pt denies CP/SOB. BP 103/60 manually. HR 40s. A&oX4. 20 G in left hand. Pt has no complaints at all, thought she would just go to her appointment then go home. Pt took her tylenol and xanax PTA at doctors office, as usually. Pt has been doing PT, and has been told to wait before sitting and standing to get up.

## 2013-10-24 NOTE — ED Notes (Signed)
Spoke with Dr. Ellyn Hack, informed him of pt's new complaint of pain in left shoulder. He agreed with trying tylenol first since no changes in ekg and VVS. If the pain doesn't go away would like [place] to page the on call physician. He suggests possible stress test in the morning.

## 2013-10-24 NOTE — ED Notes (Signed)
Pt reports she has actually been feeling stronger then past couple of days. sts she has started her physical therapy and gets fatigued after that and sometimes sob at the end of it but no complaints other than that. Denies cp/sob now.

## 2013-10-24 NOTE — H&P (Addendum)
Patient ID: TIJERA ALLAMAN MRN: [id number], DOB/AGE: 78-07-1930   Admit date: 10/24/12   Primary Physician: Henrine Screws, MD Primary Cardiologist: Dr Ellyn Hack  HPI:    The patient is an 80 y.o. female with a history of hypertension and hypothyroidism.  On 10/09/2013, she noted severe pain localized in her chest and between her shoulder blades radiating to both shoulders. She initially went to her primary physician's office where an ECG failed to show any abnormalities. The pain persisted throughout the day. She then presented to [facility] Emergency Room, where she was found to be quite hypertensive and she described a sharp tearing type pain that sounded concerning for possible aortic dissection. She had diffuse anterolateral ST elevations but without reciprocal changes, in fact there is mild inferior solutions also noted. Based on these findings, and the concern of how she presented with the description or symptoms and hypertension, a baseline troponin was checked and she was taken for emergent CT Angiogram of the chest to rule out possible aortic dissection prior to anticoagulation. Her initial troponin was 1.2, was felt to be relatively low for ongoing chest pain all day long with possible anterior ST elevations.                    The CT scan did not show any dissection, however upon arriving back to the emergency room the patient's pain got progressively worse, and a repeat ECG showed clear-cut anterior ST elevations of at least 3-4 mm with reciprocal changes. Code STEMI was called and she was transported to the [facility] Cardiac Catheter[facility] Lab via the emergency room for stabilization. Upon arrival she was in 9-10 out of 10 pain with significant anterior elevations were noted.                   The procedure was performed by Dr. Ellyn Hack. She was found to have a 100% occlusion of the LAD. Following initial angioplasty, a focal dissection was noted just beyond the initial  occlusion site. It was unclear whether this was a spontaneous dissection versus related to the initial predilation balloon angioplasty. She underwent successful, but complex PCI of a long segment of the LAD using 2 overlapping Promus Premier DES stents as described incorporating balloon angioplasty, aspiration thrombectomy and intracoronary/IVUS with restoration of TIMI 2 flow after initial no reflow phenomenon. She also had moderate to significant proximal RCA disease in a large dominant RCA. During the procedure, she developed hypoxic respiratory failure, likely secondary to sedation plus or minus Pulmonary edema requiring emergent intubation by anesthesiology. She was placed on DAPT with ASA + Brilinta. She left the cath lab in stable condition. She was admitted to the CCU and critical care assisted with ventilation status. Post-cath, she developed atrial fibrillation and was placed on IV Amiodarone. She eventually converted to NSR and she was transitioned to PO Amiodarone. She was also on Levophed initially for hypotension. She was eventually weaned off the Levophed and she was extubated. She had no further difficulties with BP and no further a-fib. A 2D echo on 10/10/13 demonstrated an EF of 35-40%, along with diskinesis of the territory of the LAD. A low dose BB, as well as an ACE-I was initiated. She had no further pain. Cardiac rehab was consulted to assist with ambulation. She had little difficulty ambulating. It was decided to repeat an echo with contrast to see if she had any improvement in LV function, to see if a LifeVest was indicated,  and to also rule out a thrombus. The echo was performed on 10/16/13 using Definity contrast. Her EF improved to 40-45%. There was mid to distal anterior and apical hypokinesis. Definity contrast was administered and the left ventricular apexis well-visualized. There was no evidence for apical thrombus. Late definity uptake was seen in the myocardial layer of the  ventricular septum, anterior wall and out to the apex, suggesting myocardial perfusion and viability in this territory. This suggest that her systolic function will continue improve. Based on this it was determined that she did not need a LifeVest and there was no need for oral anticoagulation. Prior to discharge, she was evaluated by PT/OT and it was recommended that she undergo rehab at a SNF. She went to [facility]. Today she is in the office for follow up. While in the office she became hypotensive. Her B/P did not come up higher than 80 systolic when she was recumbent. Despite this she was fairly asymptomatic. It was deiced to send her to the hospital for admission and further evaluation. She was seen by Dr Gwenlyn Found and myself.    Problem List: Past Medical History  Diagnosis Date  . Hypertension   . Migraine headache   . Thyroid disease   . Depression   . Acute respiratory failure due to hypoxia 10/10/2013    secondary to anterior STEMI related pulmonary edema (resolved)  . ST elevation (STEMI) myocardial infarction involving left anterior descending coronary artery 10/09/13    Mid LAD occulsion s/p PCI (extensive disection)  . CAD S/P percutaneous coronary angioplasty 10/09/13    DES x 2 to Mid LAD, 2.5 x 38, 2.5 x 12 Promus (covering extensive disection), moderate RCA disesase  . Ischemic cardiomyopathy 10/16/13    Repeat Echo EF 40-45% with distal LAD hypokenesis improved from initial, post STEMI    Past Surgical History  Procedure Laterality Date  . Shoulder surgery    . Coronary angioplasty with stent placement  10/09/13    2 overlapping Promus DES to mid LAD, occlusion/dissection (2.5 x 38, 2.5 x 12)     Allergies:  Allergies  Allergen Reactions  . Iohexol      Code: HIVES, Desc: anaphylactic shock s/p contrast injection many yrs ago--suggested that pt NEVER have iv contrast//a.c., Onset Date: [id number]   . Penicillins Nausea Only  . Sulfa Antibiotics Rash     Home  Medications Current Outpatient Prescriptions  Medication Sig Dispense Refill  . acetaminophen (TYLENOL) 500 MG tablet Take 500-1,000 mg by mouth every 6 (six) hours as needed for mild pain.      Marland Kitchen ALPRAZolam (XANAX) 0.25 MG tablet Take 0.25 mg by mouth 3 (three) times daily as needed for anxiety.      Marland Kitchen amiodarone (PACERONE) 200 MG tablet Take 200 mg by mouth 2 (two) times daily. End on 11/04/2013 then take 1(200 mg) tablets daily      . aspirin 81 MG chewable tablet Chew 1 tablet (81 mg total) by mouth daily.      Marland Kitchen atorvastatin (LIPITOR) 80 MG tablet Take 1 tablet (80 mg total) by mouth daily at 6 PM.  30 tablet  5  . Biotin 1000 MCG tablet Take 1,000 mcg by mouth daily.      . calcium-vitamin D (OSCAL WITH D) 500-200 MG-UNIT per tablet Take 2 tablets by mouth daily.      . carvedilol (COREG) 6.25 MG tablet Take 1 tablet (6.25 mg total) by mouth 2 (two) times daily with a meal.  60 tablet  5  . cholecalciferol (VITAMIN D) 1000 UNITS tablet Take 1,000 Units by mouth daily.      . clobetasol cream (TEMOVATE) [id number] % Apply 1 application topically 2 (two) times daily.      . clorazepate (TRANXENE) 3.75 MG tablet Take 1/4 tablet by mouth twice daily as needed for anxiety  30 tablet  5  . famotidine (PEPCID) 10 MG tablet Take 10 mg by mouth 2 (two) times daily as needed for heartburn or indigestion.      . fexofenadine (ALLEGRA) 60 MG tablet Take 60 mg by mouth 2 (two) times daily as needed for allergies or rhinitis.      . furosemide (LASIX) 20 MG tablet Take 1 tablet (20 mg total) by mouth 2 (two) times daily.  60 tablet  5  . levothyroxine (SYNTHROID, LEVOTHROID) 100 MCG tablet Take 100 mcg by mouth daily before breakfast.      . lisinopril (PRINIVIL,ZESTRIL) 5 MG tablet Take 1 tablet (5 mg total) by mouth daily.  30 tablet  5  . Multiple Vitamins-Minerals (MULTIVITAMIN WITH MINERALS) tablet Take 1 tablet by mouth daily.      . sertraline (ZOLOFT) 50 MG tablet Take 50 mg by mouth daily.      .  Ticagrelor (BRILINTA) 90 MG TABS tablet Take 1 tablet (90 mg total) by mouth 2 (two) times daily.  60 tablet  5  . vitamin E (VITAMIN E) 400 UNIT capsule Take 400 Units by mouth daily.       No current facility-administered medications for this visit.   Facility-Administered Medications Ordered in Other Visits  Medication Dose Route Frequency Provider Last Rate Last Dose  . etomidate (AMIDATE) injection    Anesthesia Intra-op Fulton Reek, MD   14 mg at 10/10/13 0105  . rocuronium [facility]) injection    Anesthesia Intra-op Fulton Reek, MD   50 mg at 10/10/13 0110  . succinylcholine (ANECTINE) injection    Anesthesia Intra-op Fulton Reek, MD   100 mg at 10/10/13 0105     Family History  Problem Relation Age of Onset  . Varicose Veins Father      History   Social History  . Marital Status: Married    Spouse Name: N/A    Number of Children: N/A  . Years of Education: N/A   Occupational History  . Not on file.   Social History Main Topics  . Smoking status: Former Research scientist (life sciences)  . Smokeless tobacco: Not on file  . Alcohol Use: No  . Drug Use: No  . Sexual Activity: Not on file   Other Topics Concern  . Not on file   Social History Narrative   Patient is Married, since 1958 [name])   Lives in apartment, Independent Living section at [facility] since 04/2013.   Stopped Smoking many years ago, minimal aalcohol history.    Regular exercise includes swimming 3d/week,   Patient has no Advanced planning documents                    Review of Systems: General: negative for chills, fever, night sweats or weight changes.  Cardiovascular: negative for chest pain, dyspnea on exertion, edema, orthopnea, palpitations, paroxysmal nocturnal dyspnea or shortness of breath Dermatological: negative for rash Respiratory: negative for cough or wheezing Urologic: negative for hematuria Abdominal: negative for nausea, vomiting, diarrhea, bright red blood  per rectum, melena, or hematemesis Neurologic: negative for visual changes, syncope, or dizziness All other systems reviewed and  are otherwise negative except as noted above.  Physical Exam: Blood pressure 100/56, height 5\' 6"  (1.676 m), weight 208 lb (94.348 kg).  General appearance: alert, cooperative, no distress, moderately obese and pale Neck: no carotid bruit and no JVD Lungs: clear to auscultation bilaterally Heart: regular rate and rhythm and decreased heart sounds Abdomen: obese, non tender. Rectal external hemorrhoids, heme negative brown stool. Extremities: no edema Pulses: diminnished Skin: pale. cool, and dry Neurologic: Grossly normal    Labs:  No results found for this or any previous visit (from the past 24 hour(s)).     ASSESSMENT AND PLAN:   Orthostatic hypotension STEMI 10/09/13 Cardiomyopathy 30-35%  PLAN: Admit, check labs, decrease meds, gentle hydration.   Henri Medal, PA-C 10/24/2013, 11:59 AM  Agree with note written by Kerin Ransom [facility]  Agree with note by Kerin Ransom. PT was hypotensive and orthostatic. Will admit for gentle hydration.    Lorretta Harp 10/27/2013 5:24 PM

## 2013-10-24 NOTE — ED Provider Notes (Signed)
Medical screening examination/treatment/procedure(s) were performed by non-physician practitioner and as supervising physician I was immediately available for consultation/collaboration.  EKG Interpretation   None         Aneya Daddona David Yevette Knust, MD 10/24/13 1847 

## 2013-10-24 NOTE — ED Notes (Signed)
Phlebotomy at bedside.

## 2013-10-24 NOTE — Consult Note (Signed)
As him and evaluating this point with leukocoria PA-C in the office and agree with the decision to admit her for hydration. The patient is well-known to me with a history of acute anterior wall myocardial infarction into intervention by Dr. Ellyn Hack with cardiogenic shock and proximal A. Fib. Ultimately she recovered. Today in the office she is orthostatic with hypotension. Her exam otherwise is benign. Her EKG shows sinus rhythm without acute change was inferolateral T-wave inversion. Her exam is otherwise benign.  Lorretta Harp, M.D., [place], [facility], Laverta [place] [address] [place], [place]  [address]  [phone] 10/24/2013 5:00 PM

## 2013-10-24 NOTE — ED Provider Notes (Signed)
Patient presents emergency department with chief complaints of hypertension. She was seen at her cardiologist's office earlier this morning. Was sent to the hospital for admission. However there are no beds available in the hospital, so she was sent to the emergency department. She currently denies any chest pain, or new shortness of breath. She states that she does have shortness of breath with exertion at baseline following her recent hospitalization. She denies any dizziness. She has been successfully undergoing physical therapy. Reportedly, she became hypotensive and her cardiologist's office today. Her blood pressure was 67/44 lying, and then 275 systolic sitting up. EKG showed first degree block, with no ST changes. Patient is currently stable and not in any apparent distress. I discussed the patient with cardiology, who has already placed admission orders.  PE: Gen: A&O x4 HEENT: PERRL, EOM intact CHEST: RRR, no m/r/g LUNGS: CTAB, no w/r/r ABD: BS x 4, ND/NT EXT: No edema, strong peripheral pulses NEURO: Sensation and strength intact bilaterally   Assessment: Hypotension  Plan: Admit  I discussed the patient with Dr. Doy Mince.  Patient was seen by EDP because no beds are available in the hospital, and the patient will have wait a considerable amount of time and be monitored in the ED until taken to a bed.     Montine Circle, PA-C 10/24/13 1310

## 2013-10-24 NOTE — ED Notes (Signed)
Pt c/o discomfort to IV site, no swelling or redness seen, fluids flowing through IV with no issue. Pt asked if she would like staff to start a new IV, pt refused for the moment, wants to see if the discomfort will go away. Pt told to keep an eye on it and if she sees and redness or swelling to let staff know immediately.

## 2013-10-24 NOTE — Assessment & Plan Note (Signed)
STEMI 10/09/13 with shock

## 2013-10-24 NOTE — ED Notes (Addendum)
Pt reports about 5 mins ago she started to get pain behind her left shoulder, rating at a 3/10. Pt reports that it feels the same as when she had her last MI. VSS, no elevation seen on 12 lead, pt in non-diaphoretic, resp e/u, nad.

## 2013-10-25 DIAGNOSIS — I2119 ST elevation (STEMI) myocardial infarction involving other coronary artery of inferior wall: Secondary | ICD-10-CM | POA: Diagnosis not present

## 2013-10-25 DIAGNOSIS — R269 Unspecified abnormalities of gait and mobility: Secondary | ICD-10-CM | POA: Diagnosis not present

## 2013-10-25 DIAGNOSIS — R079 Chest pain, unspecified: Secondary | ICD-10-CM | POA: Diagnosis not present

## 2013-10-25 DIAGNOSIS — I951 Orthostatic hypotension: Secondary | ICD-10-CM | POA: Diagnosis not present

## 2013-10-25 DIAGNOSIS — M6281 Muscle weakness (generalized): Secondary | ICD-10-CM | POA: Diagnosis not present

## 2013-10-25 DIAGNOSIS — R0602 Shortness of breath: Secondary | ICD-10-CM | POA: Diagnosis not present

## 2013-10-25 DIAGNOSIS — R279 Unspecified lack of coordination: Secondary | ICD-10-CM | POA: Diagnosis not present

## 2013-10-25 LAB — BASIC METABOLIC PANEL
BUN: 19 mg/dL (ref 6–23)
CO2: 22 mEq/L (ref 19–32)
Calcium: 9.2 mg/dL (ref 8.4–10.5)
Chloride: 109 mEq/L (ref 96–112)
Creatinine, Ser: 0.92 mg/dL (ref 0.50–1.10)
GFR calc Af Amer: 66 mL/min — ABNORMAL LOW (ref >90)
GFR calc non Af Amer: 57 mL/min — ABNORMAL LOW (ref >90)
Glucose, Bld: 104 mg/dL — ABNORMAL HIGH (ref 70–99)
Potassium: 3.6 mEq/L — ABNORMAL LOW (ref 3.7–5.3)
Sodium: 141 mEq/L (ref 137–147)

## 2013-10-25 LAB — CBC
HCT: 30.3 % — ABNORMAL LOW (ref 36.0–46.0)
Hemoglobin: 9.8 g/dL — ABNORMAL LOW (ref 12.0–15.0)
MCH: 30.8 pg (ref 26.0–34.0)
MCHC: 32.3 g/dL (ref 30.0–36.0)
MCV: 95.3 fL (ref 78.0–100.0)
Platelets: 226 10*3/uL (ref 150–400)
RBC: 3.18 MIL/uL — ABNORMAL LOW (ref 3.87–5.11)
RDW: 15.2 % (ref 11.5–15.5)
WBC: 6.9 10*3/uL (ref 4.0–10.5)

## 2013-10-25 MED ORDER — FUROSEMIDE 20 MG PO TABS: 20.0000 mg | ORAL_TABLET | Freq: Every day | ORAL | Status: DC | PRN

## 2013-10-25 NOTE — Discharge Summary (Signed)
CARDIOLOGY DISCHARGE SUMMARY   Patient ID: Melinda Noble MRN: [id number] DOB/AGE: October 13, 1930 80 y.o.  Admit date: 10/24/2013 Discharge date: 10/25/2013  PCP: Henrine Screws, MD Primary Cardiologist: Ellyn Hack  Primary Discharge Diagnosis: Orthostatic hypotension   Secondary Discharge Diagnosis:  Sinus bradycardia - BB changed Anemia Hypothyroid    Procedures:  None  Hospital Course: Melinda Noble is a 80 y.o. female with hx anterior STEMI 12/22, PCI LAD w/ 2 DES, EF 40-45% 12/29. Seen in f/u in office 01/06 and was hypotensive. Admitted for management.  She was hydrated overnight, her atenolol was discontinued and she was started on Coreg. Her Lasix was held. Her condition improved and she had no more hypotension.   Her labs showed anemia, in line with previous values and she is encouraged to follow up with primary care for this. Discharge CBC is below, MCV OK. Her potassium decreased slightly with hydration, and was supplemented. A TSH was checked and was WNL, so no dose change on her Synthroid was indicated.   On 10/25/2013 she was seen by Dr. Mare Ferrari. She was ambulating without chest pain, SOB or dizziness. She was evaluated and all data were reviewed. No further inpatient workup is indicated and she is considered stable for discharge, to follow up as an outpatient.   Labs:   Lab Results  Component Value Date   WBC 6.9 10/25/2013   HGB 9.8* 10/25/2013   HCT 30.3* 10/25/2013   MCV 95.3 10/25/2013   PLT 226 10/25/2013     Recent Labs Lab 10/24/13 1412 10/25/13 0240  NA 140 141  K 3.9 3.6*  CL 102 109  CO2 27 22  BUN 19 19  CREATININE 0.98 0.92  CALCIUM 10.2 9.2  PROT 7.0  --   BILITOT 0.7  --   ALKPHOS 107  --   ALT 27  --   AST 29  --   GLUCOSE 107* 104*   EKG: 10/24/2013 Sinus Brady, no acute changes HR 53  FOLLOW UP PLANS AND APPOINTMENTS Allergies  Allergen Reactions  . Iohexol      Code: HIVES, Desc: anaphylactic shock s/p contrast injection many yrs  ago--suggested that pt NEVER have iv contrast//a.c., Onset Date: [id number]   . Penicillins Nausea Only  . Sulfa Antibiotics Rash     Medication List    STOP taking these medications       atenolol 25 MG tablet  Commonly known as:  TENORMIN      TAKE these medications       acetaminophen 500 MG tablet  Commonly known as:  TYLENOL  Take 500-1,000 mg by mouth every 6 (six) hours as needed for mild pain.     amiodarone 200 MG tablet  Commonly known as:  PACERONE  Take 200 mg by mouth 2 (two) times daily. End on 11/04/2013 then take 1(200 mg) tablets daily     aspirin 81 MG chewable tablet  Chew 1 tablet (81 mg total) by mouth daily.     atorvastatin 80 MG tablet  Commonly known as:  LIPITOR  Take 1 tablet (80 mg total) by mouth daily at 6 PM.     Biotin 1000 MCG tablet  Take 1,000 mcg by mouth daily.     calcium-vitamin D 500-200 MG-UNIT per tablet  Commonly known as:  OSCAL WITH D  Take 2 tablets by mouth daily.     carvedilol 6.25 MG tablet  Commonly known as:  COREG  Take 1 tablet (6.25 mg  total) by mouth 2 (two) times daily with a meal.     cholecalciferol 1000 UNITS tablet  Commonly known as:  VITAMIN D  Take 1,000 Units by mouth daily.     clorazepate 3.75 MG tablet  Commonly known as:  TRANXENE  Take 1.875 mg by mouth 2 (two) times daily as needed for anxiety.     fexofenadine 60 MG tablet  Commonly known as:  ALLEGRA  Take 60 mg by mouth 2 (two) times daily as needed for allergies or rhinitis.     furosemide 20 MG tablet  Commonly known as:  LASIX  Take 1 tablet (20 mg total) by mouth daily as needed (Weight gain of 5 lbs in a week, or 3 lbs in a day.).     levothyroxine 100 MCG tablet  Commonly known as:  SYNTHROID, LEVOTHROID  Take 100 mcg by mouth daily before breakfast.     lisinopril 5 MG tablet  Commonly known as:  PRINIVIL,ZESTRIL  Take 1 tablet (5 mg total) by mouth daily.     multivitamin with minerals tablet  Take 1 tablet by mouth  daily.     omeprazole 20 MG capsule  Commonly known as:  PRILOSEC  Take 20 mg by mouth daily.     sertraline 50 MG tablet  Commonly known as:  ZOLOFT  Take 50 mg by mouth daily.     Ticagrelor 90 MG Tabs tablet  Commonly known as:  BRILINTA  Take 1 tablet (90 mg total) by mouth 2 (two) times daily.     vitamin E 400 UNIT capsule  Generic drug:  vitamin E  Take 400 Units by mouth daily.        Discharge Orders   Future Appointments Provider Department Dept Phone   11/08/2013 10:00 AM Doreene Burke Altha, [facility] [phone]   Future Orders Complete By Expires   Diet - low sodium heart healthy  As directed    Increase activity slowly  As directed      Follow-up Information   Follow up with Erlene Quan, PA-C On 11/08/2013. (See for Dr. Ellyn Hack at 10:00 am)    Specialty:  Cardiology   Contact information:   [address] [place] [address] [phone]       BRING ALL MEDICATIONS WITH YOU TO FOLLOW UP APPOINTMENTS  Time spent with patient to include physician time: 38 min Signed: Rosaria Ferries, PA-C 10/25/2013, 11:50 AM Co-Sign MD

## 2013-10-25 NOTE — Discharge Instructions (Signed)
Weigh every day in the morning wearing the same kind of clothing. Take a furosemide (Lasix) 20 mg once a day for weight gains of 5 lbs in 1 week or 3 lbs overnight. Call if there are any questions.

## 2013-10-25 NOTE — Progress Notes (Signed)
Patient Name: Melinda Noble Date of Encounter: 10/25/2013  Active Problems:   Orthostatic hypotension    SUBJECTIVE:  No chest pain, no SOB, feels well. Wants to get up for BM.  OBJECTIVE Filed Vitals:   10/25/13 0210 10/25/13 0400 10/25/13 0736 10/25/13 0738  BP: 109/51 97/35  129/55  Pulse: 53 55  57  Temp:  97.6 F (36.4 C) 98.5 F (36.9 C) 98.5 F (36.9 C)  TempSrc:  Oral Oral Oral  Resp: 17 16  13   Height:      Weight:  214 lb 11.7 oz (97.4 kg)    SpO2: 96% 96%  95%    Intake/Output Summary (Last 24 hours) at 10/25/13 0754 Last data filed at 10/25/13 0600  Gross per 24 hour  Intake    780 ml  Output    675 ml  Net    105 ml   Filed Weights   10/24/13 1231 10/24/13 2025 10/25/13 0400  Weight: 206 lb (93.441 kg) 214 lb 11.7 oz (97.4 kg) 214 lb 11.7 oz (97.4 kg)    PHYSICAL EXAM General: Well developed, well nourished, female in no acute distress. Head: Normocephalic, atraumatic.  Neck: Supple without bruits, JVD not elevated. Lungs:  Resp regular and unlabored, CTA. Heart: RRR, S1, S2, no S3, S4, 2/6 murmur; no rub. Abdomen: Soft, non-tender, non-distended, BS + x 4.  Extremities: No clubbing, cyanosis, no edema.  Neuro: Alert and oriented X 3. Moves all extremities spontaneously. Psych: Normal affect.  LABS: CBC: Recent Labs  10/24/13 1412 10/25/13 0240  WBC 10.3 6.9  HGB 10.8* 9.8*  HCT 32.5* 30.3*  MCV 93.4 95.3  PLT PLATELET CLUMPS NOTED ON SMEAR, COUNT APPEARS ADEQUATE 419   Basic Metabolic Panel: Recent Labs  10/24/13 1412 10/25/13 0240  NA 140 141  K 3.9 3.6*  CL 102 109  CO2 27 22  GLUCOSE 107* 104*  BUN 19 19  CREATININE 0.98 0.92  CALCIUM 10.2 9.2   Liver Function Tests: Recent Labs  10/24/13 1412  AST 29  ALT 27  ALKPHOS 107  BILITOT 0.7  PROT 7.0  ALBUMIN 3.5   BNP: Pro B Natriuretic peptide (BNP)  Date/Time Value Range Status  10/11/2013  5:00 AM 2172.0* 0 - 450 pg/mL Final   Thyroid Function Tests: Recent  Labs  10/24/13 1412  TSH 3.069    TELE:   SR, sinus brady 50s. 1 episode wide-complex beats, brief, rate <100, no Sx. See strips.  Current Medications:  . amiodarone  200 mg Oral BID  . atorvastatin  40 mg Oral q1800  . carvedilol  3.125 mg Oral BID WC  . levothyroxine  100 mcg Oral QAC breakfast  . pantoprazole  40 mg Oral Daily  . sertraline  50 mg Oral QHS  . Ticagrelor  90 mg Oral BID   . sodium chloride 75 mL/hr at 10/25/13 0321    ASSESSMENT AND PLAN: 80 yo female with hx anterior STEMI 12/22, PCI LAD w/ 2 DES, EF 40-45% 12/29. Seen in f/u in office 01/06 and was hypotensive. Admitted for management.  Active Problems:   Orthostatic hypotension - improved with hydration. BUN at baseline, Cr still slightly above baseline. Continue hydration for now. OK to increase activity and see how tolerated. PTA was on Lasix 20 mg BID. Consider Lasix 20 mg daily PRN for edema, wt gain. Coreg changed 6.25->3.125 mg BID. TSH OK, no dose change in Synthroid.     Anemia - H&H in  line with values during recent admission, MCV OK. F/u as OP.  Plan: possible d/c later if does well with ambulation.   SignedRosaria Ferries , PA-C 7:54 AM 10/25/2013 Patient seen.  Agree with assessment and plan above.  Today she feels fine, has ambulated to bathroom without any symptoms. Okay for discharge today off lasix-- use lasix only PRN for weight gain or significant edema.  Office followup with Dr. Ellyn Hack.

## 2013-10-25 NOTE — Progress Notes (Signed)
Clinical Social Work Department BRIEF PSYCHOSOCIAL ASSESSMENT 10/25/2013  Patient:  Melinda Noble, Melinda Noble     Account Number:  [id number]     Admit date:  10/24/2013  Clinical Social Worker:  Freeman Caldron  Date/Time:  10/25/2013 12:22 PM  Referred by:  Physician  Date Referred:  10/25/2013 Referred for  SNF Placement   Other Referral:   Interview type:  Family Other interview type:   CSW spoke with pt's husband over the phone, at his request.    PSYCHOSOCIAL DATA Living Status:  FACILITY Admitted from facility:  [facility] Level of care:  [place] Primary support name:  Melinda Noble ([phone]) Primary support relationship to patient:  SPOUSE Degree of support available:   Good--pt from [facility] independent living level but was at rehab before admission to hospital. Husband explained this to [place] over the phone. CSW called [name] and facility requested discharge summary and signed FL2. CSW compiling discharge packet now and bringing this to pt's husband who will bring this to facility.    CURRENT CONCERNS Current Concerns  Post-Acute Placement   Other Concerns:    SOCIAL WORK ASSESSMENT / PLAN  Assessment/plan status:  No Further Intervention Required Other assessment/ plan:   Information/referral to community resources:   Discharge packet provided to pt and husband.    PATIENT'S/FAMILY'S RESPONSE TO PLAN OF CARE: Good--pt's husband expressed gratitude for CSW assistance and understanding of CSW role.       Ky Barban, MSW, [facility] Clinical Social Worker [phone]

## 2013-10-26 ENCOUNTER — Non-Acute Institutional Stay (SKILLED_NURSING_FACILITY): Payer: Medicare Other | Admitting: Geriatric Medicine

## 2013-10-26 ENCOUNTER — Encounter: Payer: Self-pay | Admitting: Geriatric Medicine

## 2013-10-26 ENCOUNTER — Telehealth: Payer: Self-pay | Admitting: Cardiology

## 2013-10-26 DIAGNOSIS — R269 Unspecified abnormalities of gait and mobility: Secondary | ICD-10-CM | POA: Diagnosis not present

## 2013-10-26 DIAGNOSIS — M6281 Muscle weakness (generalized): Secondary | ICD-10-CM | POA: Diagnosis not present

## 2013-10-26 DIAGNOSIS — I10 Essential (primary) hypertension: Secondary | ICD-10-CM | POA: Diagnosis not present

## 2013-10-26 DIAGNOSIS — I951 Orthostatic hypotension: Secondary | ICD-10-CM

## 2013-10-26 DIAGNOSIS — I2119 ST elevation (STEMI) myocardial infarction involving other coronary artery of inferior wall: Secondary | ICD-10-CM | POA: Diagnosis not present

## 2013-10-26 DIAGNOSIS — F411 Generalized anxiety disorder: Secondary | ICD-10-CM

## 2013-10-26 DIAGNOSIS — I213 ST elevation (STEMI) myocardial infarction of unspecified site: Secondary | ICD-10-CM

## 2013-10-26 DIAGNOSIS — I4891 Unspecified atrial fibrillation: Secondary | ICD-10-CM

## 2013-10-26 DIAGNOSIS — R279 Unspecified lack of coordination: Secondary | ICD-10-CM | POA: Diagnosis not present

## 2013-10-26 DIAGNOSIS — I219 Acute myocardial infarction, unspecified: Secondary | ICD-10-CM

## 2013-10-26 DIAGNOSIS — R0602 Shortness of breath: Secondary | ICD-10-CM | POA: Diagnosis not present

## 2013-10-26 DIAGNOSIS — I480 Paroxysmal atrial fibrillation: Secondary | ICD-10-CM

## 2013-10-26 DIAGNOSIS — R079 Chest pain, unspecified: Secondary | ICD-10-CM | POA: Diagnosis not present

## 2013-10-26 NOTE — Assessment & Plan Note (Addendum)
Remains free of chest pain, dyspnea. Had episode of hypotension at cardiology office 2 days ago, treated with IV fluids in hospital overnight. Lasix was discontinued. Patient is ambulating safely, is independent with ADLs. Spouse will be a support in the home with meals in general healthcare. Anticipate patient will be ready for discharge tomorrow morning. PT and OT will continue in the home setting

## 2013-10-26 NOTE — Progress Notes (Signed)
Patient ID: Melinda Noble, female   DOB: 04/18/1930, 80 y.o.   MRN: [id number]   [facility] [phone])  Code Status: Full Code      Contact Information   Name Relation Home Work Albion L Spouse [phone]  [phone]   Shannia, Jacuinde Daughter   [phone]      Chief Complaint  Patient presents with  . Discharge Note    Rehab discharge    HPI:  This is a 80 y.o. female resident of [facility], Independent Living  section.  She was admitted to hospital 10/09/2013 with STEMI. There was initial concern for possible aortic dissection but this was ruled out. Patient was transported to the cardiac Cath Lab where she underwent angioplasty and complex PCI of a long segment of the LAD. Procedure was complicated by hypoxic respiratory failure requiring intubation. Following procedure patient was transferred to the intensive care unit where hypotension and atrial fibrillation were treated. Patient stabilized, atrial fibrillation resolved, she was extubated. 2-D echo on 10/10/2013 demonstrated EF of 35-40% along with dyskinesis of the territory of the LAD. Patient was started on low-dose beta blocker as well as ACE inhibitor. Cardiac rehabilitation was initiated. Repeat echocardiogram on 10/16/2013 showed improved EF to 40-45%. The patient continued to improve and was ready for discharge to skilled rehabilitation section at [facility] on 10/17/2013.  Last visit: STEMI 10/09/13 No CP, mild ftaigue, low stamina. Will benefit from Rehab environment, PT/OT to regain independent functional status. Anticipate Rehab stay 7-10 days. Followup with cardiology as scheduled January 6.  Generalized anxiety disorder Patient reports she as been taking low dose Tranxene "for years",during hospitalization, was given alprazolam with good result. Spouse requested this medication be prescribed during Rehab stay.   Hx of HTN BP well controlled  PAF- peri-MI.  NSR on Amio at discharge Heart remains in regular rhythm, rate satisfactory. Continue amiodarone as prescribed with first dose reduction due January 2.  Since last visit, the patient made very good progress in recovering from her MI and hospitalization. Followup labs were performed January 5, all WNL. Discharge to her  Independent living home was  anticipated following cardiology appointment 10/24/2013. During that appointment patient had a hypotensive episode; she was transferred to the emergency department and admitted to the hospital overnight. Lab results showed mild anemia and mild hypokalemia. She received IV fluids, was discharged back to rehabilitation section of [name] late 10/25/2013.  Today patient reports feeling very well. Patient and nursing staff report she is independent with ambulation and ADLs. Vital signs have remained stable, her p.o. intake is adequate.  Allergies  Allergen Reactions  . Iohexol      Code: HIVES, Desc: anaphylactic shock s/p contrast injection many yrs ago--suggested that pt NEVER have iv contrast//a.c., Onset Date: [id number]   . Penicillins Nausea Only  . Sulfa Antibiotics Rash      Medication List       This list is accurate as of: 10/26/13  9:21 PM.  Always use your most recent med list.               acetaminophen 500 MG tablet  Commonly known as:  TYLENOL  Take 500-1,000 mg by mouth every 6 (six) hours as needed for mild pain.     ALPRAZolam 0.25 MG tablet  Commonly known as:  XANAX  Take 0.25 mg by mouth 3 (three) times daily as needed for anxiety.     amiodarone 200 MG tablet  Commonly known  as:  PACERONE  Take 200 mg by mouth 2 (two) times daily. End on 11/04/2013 then take 1(200 mg) tablets daily     aspirin 81 MG chewable tablet  Chew 1 tablet (81 mg total) by mouth daily.     atorvastatin 80 MG tablet  Commonly known as:  LIPITOR  Take 1 tablet (80 mg total) by mouth daily at 6 PM.     Biotin 1000 MCG tablet  Take 1,000 mcg  by mouth daily.     calcium-vitamin D 500-200 MG-UNIT per tablet  Commonly known as:  OSCAL WITH D  Take 2 tablets by mouth daily.     carvedilol 6.25 MG tablet  Commonly known as:  COREG  Take 1 tablet (6.25 mg total) by mouth 2 (two) times daily with a meal.     cholecalciferol 1000 UNITS tablet  Commonly known as:  VITAMIN D  Take 1,000 Units by mouth daily.     fexofenadine 60 MG tablet  Commonly known as:  ALLEGRA  Take 60 mg by mouth 2 (two) times daily as needed for allergies or rhinitis.     furosemide 20 MG tablet  Commonly known as:  LASIX  Take 1 tablet (20 mg total) by mouth daily as needed (Weight gain of 5 lbs in a week, or 3 lbs in a day.).     levothyroxine 100 MCG tablet  Commonly known as:  SYNTHROID, LEVOTHROID  Take 100 mcg by mouth daily before breakfast.     lisinopril 5 MG tablet  Commonly known as:  PRINIVIL,ZESTRIL  Take 1 tablet (5 mg total) by mouth daily.     multivitamin with minerals tablet  Take 1 tablet by mouth daily.     omeprazole 20 MG capsule  Commonly known as:  PRILOSEC  Take 20 mg by mouth daily.     sertraline 50 MG tablet  Commonly known as:  ZOLOFT  Take 50 mg by mouth daily.     Ticagrelor 90 MG Tabs tablet  Commonly known as:  BRILINTA  Take 1 tablet (90 mg total) by mouth 2 (two) times daily.     vitamin E 400 UNIT capsule  Generic drug:  vitamin E  Take 400 Units by mouth daily.         DATA REVIEWED  Radiological   Cardiovascular 10/10/2013 Transthoracic Echocardiography Study Conclusions - Left ventricle: The cavity size was normal. There was mild   concentric hypertrophy, with an appearance suggesting   concentric remodeling (increased wall thickness with   normal wall mass). Systolic function was moderately reduced. The estimated ejection fraction was in the range   of 35% to 40%. Doppler parameters are consistent with abnormal left ventricular relaxation (grade 1 diastolic dysfunction). Doppler  parameters are consistent with   elevated mean left atrial filling pressure. - Regional wall motion abnormality: Dyskinesis of the apical septal and apical myocardium; akinesis of the mid  anteroseptal and apical inferior myocardium; severe   hypokinesis of the apical anterior myocardium; moderate hypokinesis of the mid inferolateral and apical lateral  myocardium; mild hypokinesis of the mid inferoseptal   myocardium. - Aortic valve: Trivial regurgitation. - Left atrium: The atrium was moderately dilated.  10/12/2013 Bilateral Lower Extremity Venous Duplex Evaluation Summary: - No evidence of deep vein thrombosis involving the right  lower extremity. - No evidence of deep vein thrombosis involving the left  lower extremity. - No evidence of Baker's cyst on the right or left.  10/16/2013 Transthoracic Echocardiography  -  Study Conclusions - HPI and indications: Limited study for LVEF. R/O apical  thrombus. - Left ventricle: The cavity size was normal. There was mild concentric hypertrophy. Systolic function was mildly to moderately reduced. The estimated ejection fraction was in the range of 40% to 45%. There is mid to distal anterior and apical hypokinesis. Definity contrast was administered  and the left ventricular apexis well-visualized. There is no evidence for apical thrombus. Late definity uptake is seen in the myocardial layer of the ventricular   septum,anterior wall andout to the apex, suggesting  myocardial perfusion and viability in this territory.  Laboratory   [facility] 10/23/2013 sodium 136, potassium 3.8, glucose 80, BUN 18, creatinine 0.79  Total cholesterol 98, triglyceride 130, HDL 33, LDL 46  TSH 3.59  Lab Results- hospital 10/25/2013  Component Value Date   WBC 6.9 10/25/2013   HGB 9.8* 10/25/2013   HCT 30.3* 10/25/2013   PLT 226 10/25/2013        GLUCOSE 104* 10/25/2013   NA 141 10/25/2013   K 3.6* 10/25/2013   CL 109 10/25/2013   CREATININE 0.92 10/25/2013   BUN 19  10/25/2013   CO2 22 10/25/2013        TSH 3.069 10/24/2013   REVIEW OF SYSTEMS DATA OBTAINED: from patient, medical record, spouse GENERAL: Feels well, no fatigue, appetite is good SKIN: No itch, rash or open wounds EYES: No eye pain, dryness or itching  No change in vision EARS: No earache, tinnitus, change in hearing NOSE: No congestion, drainage or bleeding MOUTH/THROAT: No mouth or tooth pain  No sore throat   No difficulty chewing or swallowing RESPIRATORY: No cough, wheezing, SOB CARDIAC: No chest pain, palpitations  No edema. GI: No abdominal pain  No N/V/D or constipation  No heartburn or reflux  GU: No dysuria, frequency or urgency  No change in urine volume or character  MUSCULOSKELETAL: No joint pain, swelling or stiffness  No back pain  No muscle ache, pain,  Gait is steady  No recent falls.  NEUROLOGIC: No dizziness, fainting, headache,  PSYCHIATRIC: No feelings of anxiety, depression Sleeps well.     PHYSICAL EXAM   Filed Vitals:   10/26/13 2118  BP: 123/65  Pulse: 64  Temp: 97.5 F (36.4 C)  Resp: 18  Weight: 212 lb 12.8 oz (96.525 kg)  SpO2: 98%  Body mass index is 34.36 kg/(m^2).  GENERAL APPEARANCE: No acute distress, appropriately groomed, Overweight body habitus. Alert, pleasant, conversant. SKIN: No diaphoresis, rash, unusual lesions, wounds HEAD: Normocephalic, atraumatic EYES: Conjunctiva/lids clear. Pupils round, reactive. EOMs intact.  EARS: External exam WNL, Hearing grossly normal. NOSE: No deformity or discharge. MOUTH/THROAT: Lips w/o lesions. Oral mucosa, tongue moist, w/o lesion. Oropharynx w/o redness or lesions.  NECK: Supple, full ROM. No thyroid tenderness, enlargement or nodule LYMPHATICS: No head, neck or supraclavicular adenopathy RESPIRATORY: Breathing is even, unlabored. Lung sounds are clear and full.  CARDIOVASCULAR: Heart RRR. No murmur or extra heart sounds  ARTERIAL: No carotid bruit.  DP pulse 2+.  VENOUS: No varicosities. No  venous stasis skin changes  EDEMA: No peripheral or periorbital edema. GASTROINTESTINAL: Abdomen is soft, non-tender, not distended w/ normal bowel sounds.  MUSCULOSKELETAL: Moves all extremities with full ROM, strength and tone. Back is without kyphosis, scoliosis or spinal process tenderness.  NEUROLOGIC: Oriented to time, place, person. Cranial nerves 2-12 grossly intact, speech clear, no tremor.  PSYCHIATRIC: Mood and affect appropriate to situation   ASSESSMENT/PLAN  STEMI 10/09/13 Remains free of chest  pain, dyspnea. Had episode of hypotension at cardiology office 2 days ago, treated with IV fluids in hospital overnight. Lasix was discontinued. Patient is ambulating safely, is independent with ADLs. Spouse will be a support in the home with meals in general healthcare. Anticipate patient will be ready for discharge tomorrow morning. PT and OT will continue in the home setting  Generalized anxiety disorder Both patient/ spouse report that alprazolam seems to be helping her with anxiety better than Tranxene she's been taking for years. Will send patient home with one month prescription for alprazolam. She will discuss use of this medication with her primary care provider at her next visit  Hx of HTN Blood pressure remains well-controlled on current medication  PAF- peri-MI. NSR on Amio at discharge Heart remains in regular rhythm, satisfactory rate. Amiodarone dose has been reduced as directed, patient will continue with tapering dose as indicated on discharge summary. Next cardiology followup January 26  Orthostatic hypotension Resolved   Time: 35 minutes, >50% spent counseling/or care coordination  Follow up:  With Dr. Inda Merlin in 2-3 weeks, with cardiology as scheduled  Deana Krock T.Duran Ohern, NP-C 10/26/2013

## 2013-10-26 NOTE — Assessment & Plan Note (Signed)
Heart remains in regular rhythm, satisfactory rate. Amiodarone dose has been reduced as directed, patient will continue with tapering dose as indicated on discharge summary. Next cardiology followup January 26

## 2013-10-26 NOTE — Assessment & Plan Note (Signed)
Both patient/ spouse report that alprazolam seems to be helping her with anxiety better than Tranxene she's been taking for years. Will send patient home with one month prescription for alprazolam. She will discuss use of this medication with her primary care provider at her next visit

## 2013-10-26 NOTE — Assessment & Plan Note (Signed)
Blood pressure remains well controlled on current medication.

## 2013-10-26 NOTE — Telephone Encounter (Signed)
Need some clarification, on whether or not she needs to be in rehab and she wants to leave .Marland Kitchen Please call as soon as you can .Marland Kitchen

## 2013-10-26 NOTE — Telephone Encounter (Signed)
Returned call and pt verified x 2 w/ [name], pt's husband.  Stated pt is at [facility] and is wanting to go home.  Stated pt was seen on Tuesday and admitted to hospital by [facility] r/t her BP.  Stated he hasn't seen pt's discharge papers b/c the staff at [facility] has them and he wants to make sure they have the right information.  Stated pt is ready to go home and doesn't want to stay there much longer.    RN reviewed discharge information and informed pt was supposed to STOP atenolol and take Lasix prn weight gain (5 lbs in one week or 3 lbs in one day).  Pt also informed pt is supposed to f/u in 2 weeks.  Appt w/ Kerin Ransom, PA-C rescheduled for 1.19.15 at 10:20 am w/ Tarri Fuller, PA-C per husband's request.  Husband agreed w/ plan.

## 2013-10-27 DIAGNOSIS — M6281 Muscle weakness (generalized): Secondary | ICD-10-CM | POA: Diagnosis not present

## 2013-10-27 DIAGNOSIS — R079 Chest pain, unspecified: Secondary | ICD-10-CM | POA: Diagnosis not present

## 2013-10-27 DIAGNOSIS — R269 Unspecified abnormalities of gait and mobility: Secondary | ICD-10-CM | POA: Diagnosis not present

## 2013-10-27 DIAGNOSIS — R279 Unspecified lack of coordination: Secondary | ICD-10-CM | POA: Diagnosis not present

## 2013-10-27 DIAGNOSIS — R0602 Shortness of breath: Secondary | ICD-10-CM | POA: Diagnosis not present

## 2013-10-27 DIAGNOSIS — I2119 ST elevation (STEMI) myocardial infarction involving other coronary artery of inferior wall: Secondary | ICD-10-CM | POA: Diagnosis not present

## 2013-10-27 NOTE — Assessment & Plan Note (Signed)
Resolved

## 2013-10-30 DIAGNOSIS — R279 Unspecified lack of coordination: Secondary | ICD-10-CM | POA: Diagnosis not present

## 2013-10-30 DIAGNOSIS — M6281 Muscle weakness (generalized): Secondary | ICD-10-CM | POA: Diagnosis not present

## 2013-10-30 DIAGNOSIS — R0602 Shortness of breath: Secondary | ICD-10-CM | POA: Diagnosis not present

## 2013-10-30 DIAGNOSIS — R269 Unspecified abnormalities of gait and mobility: Secondary | ICD-10-CM | POA: Diagnosis not present

## 2013-10-30 DIAGNOSIS — I2119 ST elevation (STEMI) myocardial infarction involving other coronary artery of inferior wall: Secondary | ICD-10-CM | POA: Diagnosis not present

## 2013-10-30 DIAGNOSIS — R079 Chest pain, unspecified: Secondary | ICD-10-CM | POA: Diagnosis not present

## 2013-10-31 DIAGNOSIS — R0602 Shortness of breath: Secondary | ICD-10-CM | POA: Diagnosis not present

## 2013-10-31 DIAGNOSIS — R079 Chest pain, unspecified: Secondary | ICD-10-CM | POA: Diagnosis not present

## 2013-10-31 DIAGNOSIS — R269 Unspecified abnormalities of gait and mobility: Secondary | ICD-10-CM | POA: Diagnosis not present

## 2013-10-31 DIAGNOSIS — M6281 Muscle weakness (generalized): Secondary | ICD-10-CM | POA: Diagnosis not present

## 2013-10-31 DIAGNOSIS — R279 Unspecified lack of coordination: Secondary | ICD-10-CM | POA: Diagnosis not present

## 2013-10-31 DIAGNOSIS — I2119 ST elevation (STEMI) myocardial infarction involving other coronary artery of inferior wall: Secondary | ICD-10-CM | POA: Diagnosis not present

## 2013-11-01 DIAGNOSIS — R0602 Shortness of breath: Secondary | ICD-10-CM | POA: Diagnosis not present

## 2013-11-01 DIAGNOSIS — I2119 ST elevation (STEMI) myocardial infarction involving other coronary artery of inferior wall: Secondary | ICD-10-CM | POA: Diagnosis not present

## 2013-11-01 DIAGNOSIS — M6281 Muscle weakness (generalized): Secondary | ICD-10-CM | POA: Diagnosis not present

## 2013-11-01 DIAGNOSIS — K219 Gastro-esophageal reflux disease without esophagitis: Secondary | ICD-10-CM | POA: Diagnosis not present

## 2013-11-01 DIAGNOSIS — E7800 Pure hypercholesterolemia, unspecified: Secondary | ICD-10-CM | POA: Diagnosis not present

## 2013-11-01 DIAGNOSIS — R079 Chest pain, unspecified: Secondary | ICD-10-CM | POA: Diagnosis not present

## 2013-11-01 DIAGNOSIS — I4891 Unspecified atrial fibrillation: Secondary | ICD-10-CM | POA: Diagnosis not present

## 2013-11-01 DIAGNOSIS — R110 Nausea: Secondary | ICD-10-CM | POA: Diagnosis not present

## 2013-11-01 DIAGNOSIS — E039 Hypothyroidism, unspecified: Secondary | ICD-10-CM | POA: Diagnosis not present

## 2013-11-01 DIAGNOSIS — I10 Essential (primary) hypertension: Secondary | ICD-10-CM | POA: Diagnosis not present

## 2013-11-01 DIAGNOSIS — I219 Acute myocardial infarction, unspecified: Secondary | ICD-10-CM | POA: Diagnosis not present

## 2013-11-01 DIAGNOSIS — R279 Unspecified lack of coordination: Secondary | ICD-10-CM | POA: Diagnosis not present

## 2013-11-01 DIAGNOSIS — R269 Unspecified abnormalities of gait and mobility: Secondary | ICD-10-CM | POA: Diagnosis not present

## 2013-11-02 ENCOUNTER — Telehealth: Payer: Self-pay | Admitting: Cardiology

## 2013-11-02 DIAGNOSIS — R279 Unspecified lack of coordination: Secondary | ICD-10-CM | POA: Diagnosis not present

## 2013-11-02 DIAGNOSIS — I2119 ST elevation (STEMI) myocardial infarction involving other coronary artery of inferior wall: Secondary | ICD-10-CM | POA: Diagnosis not present

## 2013-11-02 DIAGNOSIS — R0602 Shortness of breath: Secondary | ICD-10-CM | POA: Diagnosis not present

## 2013-11-02 DIAGNOSIS — M6281 Muscle weakness (generalized): Secondary | ICD-10-CM | POA: Diagnosis not present

## 2013-11-02 DIAGNOSIS — R079 Chest pain, unspecified: Secondary | ICD-10-CM | POA: Diagnosis not present

## 2013-11-02 DIAGNOSIS — R269 Unspecified abnormalities of gait and mobility: Secondary | ICD-10-CM | POA: Diagnosis not present

## 2013-11-02 NOTE — Telephone Encounter (Signed)
No changes. Stay off lisinopril for now, although cough may be viral.  -Dr. Debara Pickett

## 2013-11-02 NOTE — Telephone Encounter (Signed)
I have short be tomorrow, and will try calling them.  Had not been home earlier than 7:00 PM since getting this first message. I felt routinely make a habit of calling people at home, but will do so on this one case.  Leonie Man, MD

## 2013-11-02 NOTE — Progress Notes (Signed)
Patient ID: Melinda Noble, female   DOB: 1930-10-02, 80 y.o.   MRN: [id number] Pt admitted because of orthostatic hypottension.  Lorretta Harp, M.D., [place], [facility], Laverta [place] [address] [place], [place]  [address]  [phone] 11/02/2013 1:32 PM

## 2013-11-02 NOTE — Telephone Encounter (Signed)
Said wife has been coughing for two days and cant stop  Please call

## 2013-11-02 NOTE — Telephone Encounter (Signed)
Please call today-says he need to ask you one more question.

## 2013-11-02 NOTE — Telephone Encounter (Signed)
Returned call and pt verified x 2 w/ [name], pt's husband.  Stated pt had a bad episode of coughing last night.  Stated pt has nonproductive cough and saw her PCP yesterday.  Said pt's lungs are fine.  Stated he called the on-call provider last night and was told to stop the lisinopril.  Stated he wanted to know if that is okay and to let Dr. Ellyn Hack know about it.  Stated last dose was yesterday morning.  RN asked what BP was: 150/78 at PCP's office yesterday and 148/60 HR 59, 138/64 HR 58 today.  Pt had HA and he gave her two tylenol at 6am and HA has resolved.  Stated pt was checked thoroughly at [facility] office yesterday and it is not abnormal for her BP to be up when she goes there.  Husband informed RN will notify MD on call as Dr. Ellyn Hack is out of the office today and will call him back w/ further instructions.  Verbalized understanding and stated he wants Dr. Ellyn Hack to call him back.  Sun: 128/63 Mon: 104/60  Message forwarded to Dr. Debara Pickett.  Lisinopril dc'd from med list.

## 2013-11-02 NOTE — Telephone Encounter (Signed)
Please call-started coughing around 9 last night and coughed all night.Our office called while I was taking this message.

## 2013-11-02 NOTE — Telephone Encounter (Signed)
Returned call and informed husband per instructions by MD.  Also informed Dr. Ellyn Hack will be notified to call.  Verbalized understanding and agreed w/ plan.  Message forwarded to Dr. Ellyn Hack.

## 2013-11-03 ENCOUNTER — Ambulatory Visit: Payer: Medicare Other | Admitting: Cardiology

## 2013-11-03 DIAGNOSIS — M6281 Muscle weakness (generalized): Secondary | ICD-10-CM | POA: Diagnosis not present

## 2013-11-03 DIAGNOSIS — R279 Unspecified lack of coordination: Secondary | ICD-10-CM | POA: Diagnosis not present

## 2013-11-03 DIAGNOSIS — R079 Chest pain, unspecified: Secondary | ICD-10-CM | POA: Diagnosis not present

## 2013-11-03 DIAGNOSIS — R0602 Shortness of breath: Secondary | ICD-10-CM | POA: Diagnosis not present

## 2013-11-03 DIAGNOSIS — R269 Unspecified abnormalities of gait and mobility: Secondary | ICD-10-CM | POA: Diagnosis not present

## 2013-11-03 DIAGNOSIS — I2119 ST elevation (STEMI) myocardial infarction involving other coronary artery of inferior wall: Secondary | ICD-10-CM | POA: Diagnosis not present

## 2013-11-03 NOTE — Telephone Encounter (Signed)
Returned call to Mr. Sayer.  Stated he understood instructions, but forgot to ask what was abnormal w/ BP.  Husband advised to call if BP 90/60 or less and pt symptomatic and if 140/90 or higher.  Verbalized understanding and agreed w/ plan.  Asked again if Dr. Ellyn Hack would call and informed Dr. Ellyn Hack said he would try to call him today.  He asked that Dr. Ellyn Hack call his cell phone at [phone] b/c he will not be at home all day.  Pt informed message will be relayed.  This message is already in Dr. Allison Quarry [facility] [place].

## 2013-11-03 NOTE — Telephone Encounter (Signed)
15 min Phone call with Mr. Monterosso.  His concerns:    Aairah's depression: I recommended discussing possibly increasing Zoloft to 50mg   When can she drive?  Recommended - after initial few visits to [facility] to eval stability.  Concerns re: call with night MD - apparently not called back.  I went over call back & day triage.  He is happy with Museum/gallery conserv[facility].   Is checking BPs tid.  Very happy with Tawnia' recovery & excited about orientation with [facility] next week.  I reminded him that I may not be the one talking with him as my schedule does not always allow time to call & cannot call everyone.  This is why the triage RN is in place & that I do respond to Pt calls on Epic promptly.  Leonie Man, MD

## 2013-11-06 ENCOUNTER — Encounter: Payer: Self-pay | Admitting: Physician Assistant

## 2013-11-06 ENCOUNTER — Ambulatory Visit (INDEPENDENT_AMBULATORY_CARE_PROVIDER_SITE_OTHER): Payer: Medicare Other | Admitting: Physician Assistant

## 2013-11-06 VITALS — BP 150/80 | HR 62 | Ht 66.0 in | Wt 209.0 lb

## 2013-11-06 DIAGNOSIS — I10 Essential (primary) hypertension: Secondary | ICD-10-CM

## 2013-11-06 DIAGNOSIS — I480 Paroxysmal atrial fibrillation: Secondary | ICD-10-CM

## 2013-11-06 DIAGNOSIS — E785 Hyperlipidemia, unspecified: Secondary | ICD-10-CM | POA: Diagnosis not present

## 2013-11-06 DIAGNOSIS — I2589 Other forms of chronic ischemic heart disease: Secondary | ICD-10-CM

## 2013-11-06 DIAGNOSIS — E669 Obesity, unspecified: Secondary | ICD-10-CM

## 2013-11-06 DIAGNOSIS — I255 Ischemic cardiomyopathy: Secondary | ICD-10-CM

## 2013-11-06 DIAGNOSIS — E66811 Obesity, class 1: Secondary | ICD-10-CM

## 2013-11-06 DIAGNOSIS — I4891 Unspecified atrial fibrillation: Secondary | ICD-10-CM

## 2013-11-06 MED ORDER — ATORVASTATIN CALCIUM 40 MG PO TABS: 40.0000 mg | ORAL_TABLET | Freq: Every day | ORAL | Status: DC

## 2013-11-06 MED ORDER — LOSARTAN POTASSIUM 50 MG PO TABS: 50.0000 mg | ORAL_TABLET | Freq: Every day | ORAL | Status: DC

## 2013-11-06 NOTE — Assessment & Plan Note (Signed)
Well compensated.  No signs or symptoms of acute CHF.  Weight has decreased.  Has lasix for PRN use.

## 2013-11-06 NOTE — Assessment & Plan Note (Signed)
On amiodarone.  Heart RRR.

## 2013-11-06 NOTE — Assessment & Plan Note (Signed)
LDL has dropped into the 40's.  Will decrease Lipitor to 40mg .

## 2013-11-06 NOTE — Patient Instructions (Addendum)
1.  Keep up the daily weights.  Please call if your weight starts to go up.  2.  Keep appt with Dr. Ellyn Hack in Feb.  3.  Start Losartan 50mg  daily for BP  4.  Decrease lipitor to 40mg  daily.

## 2013-11-06 NOTE — Progress Notes (Signed)
Date:  11/06/2013   ID:  Melinda Noble, DOB 01-Dec-1929, MRN [id number]  PCP:  Melinda Screws, MD  Primary Cardiologist:  Melinda Noble     History of Present Illness: Melinda Noble is a 80 y.o. female with hx anterior STEMI 12/22, PCI LAD w/ 2 DES, EF 40-45% 12/29, HTN, PAF(on amiodarone), migraines, thyroid disease, depression.  She is on DAPT with ASA and Brilinta.  She was hospitalized Jan.  6-7, 2015 for orthostatic hypotension.  She was hydrated.  Atenolol was changed to coreg.  Lasix held and she improved.  She was advised to follow up with PCP regarding anemia.   More recently she had a dry cough so lisinopril was stopped.    The patient presents today for follow up evaluation.  She reports doing well.  She does have a dry cough at times particularly lying down, but this has improved a lot since stopping ACE-I.  She is starting Cardiac rehab on Thursday this week.  She is walking without out difficulties.    ROS:  The patient currently denies nausea, vomiting, fever, chest pain, shortness of breath, orthopnea, dizziness, PND, congestion, abdominal pain, hematochezia, melena, lower extremity edema, claudication.  Wt Readings from Last 3 Encounters:  11/06/13 209 lb (94.802 kg)  10/26/13 212 lb 12.8 oz (96.525 kg)  10/25/13 214 lb 11.7 oz (97.4 kg)     Past Medical History  Diagnosis Date  . Hypertension   . Migraine headache   . Thyroid disease   . Depression   . Acute respiratory failure due to hypoxia 10/10/2013    secondary to anterior STEMI related pulmonary edema (resolved)  . ST elevation (STEMI) myocardial infarction involving left anterior descending coronary artery 10/09/13    Mid LAD occulsion s/p PCI (extensive disection)  . CAD S/P percutaneous coronary angioplasty 10/09/13    DES x 2 to Mid LAD, 2.5 x 38, 2.5 x 12 Promus (covering extensive disection), moderate RCA disesase  . Ischemic cardiomyopathy 10/16/13    Repeat Echo EF 40-45% with distal LAD hypokenesis  improved from initial, post STEMI    Current Outpatient Prescriptions  Medication Sig Dispense Refill  . acetaminophen (TYLENOL) 500 MG tablet Take 500-1,000 mg by mouth every 6 (six) hours as needed for mild pain.      Marland Kitchen ALPRAZolam (XANAX) 0.25 MG tablet Take 0.25 mg by mouth 3 (three) times daily as needed for anxiety.      Marland Kitchen amiodarone (PACERONE) 200 MG tablet Take 200 mg by mouth 2 (two) times daily. End on 11/04/2013 then take 1(200 mg) tablets daily      . aspirin 81 MG chewable tablet Chew 1 tablet (81 mg total) by mouth daily.      Marland Kitchen atorvastatin (LIPITOR) 40 MG tablet Take 1 tablet (40 mg total) by mouth daily at 6 PM.  30 tablet  5  . Biotin 1000 MCG tablet Take 1,000 mcg by mouth daily.      . calcium-vitamin D (OSCAL WITH D) 500-200 MG-UNIT per tablet Take 2 tablets by mouth daily.      . carvedilol (COREG) 6.25 MG tablet Take 1 tablet (6.25 mg total) by mouth 2 (two) times daily with a meal.  60 tablet  5  . cholecalciferol (VITAMIN D) 1000 UNITS tablet Take 1,000 Units by mouth daily.      . fexofenadine (ALLEGRA) 60 MG tablet Take 60 mg by mouth 2 (two) times daily as needed for allergies or rhinitis.      Marland Kitchen  furosemide (LASIX) 20 MG tablet Take 1 tablet (20 mg total) by mouth daily as needed (Weight gain of 5 lbs in a week, or 3 lbs in a day.).  30 tablet  5  . levothyroxine (SYNTHROID, LEVOTHROID) 100 MCG tablet Take 100 mcg by mouth daily before breakfast.      . Multiple Vitamins-Minerals (MULTIVITAMIN WITH MINERALS) tablet Take 1 tablet by mouth daily.      Marland Kitchen omeprazole (PRILOSEC) 20 MG capsule Take 20 mg by mouth daily.      . sertraline (ZOLOFT) 50 MG tablet Take 50 mg by mouth daily.      . Ticagrelor (BRILINTA) 90 MG TABS tablet Take 1 tablet (90 mg total) by mouth 2 (two) times daily.  60 tablet  5  . vitamin E (VITAMIN E) 400 UNIT capsule Take 400 Units by mouth daily.      Marland Kitchen losartan (COZAAR) 50 MG tablet Take 1 tablet (50 mg total) by mouth daily.  30 tablet  5   No  current facility-administered medications for this visit.   Facility-Administered Medications Ordered in Other Visits  Medication Dose Route Frequency Provider Last Rate Last Dose  . etomidate (AMIDATE) injection    Anesthesia Intra-op Melinda Reek, MD   14 mg at 10/10/13 0105  . rocuronium [facility]) injection    Anesthesia Intra-op Melinda Reek, MD   50 mg at 10/10/13 0110  . succinylcholine (ANECTINE) injection    Anesthesia Intra-op Melinda Reek, MD   100 mg at 10/10/13 0105    Allergies:    Allergies  Allergen Reactions  . Iohexol      Code: HIVES, Desc: anaphylactic shock s/p contrast injection many yrs ago--suggested that pt NEVER have iv contrast//a.c., Onset Date: [id number]   . Penicillins Nausea Only  . Sulfa Antibiotics Rash    Social History:  The patient  reports that she has quit smoking. She does not have any smokeless tobacco history on file. She reports that she does not drink alcohol or use illicit drugs.   Family history:   Family History  Problem Relation Age of Onset  . Varicose Veins Father     ROS:  Please see the history of present illness.  All other systems reviewed and negative.   PHYSICAL EXAM: VS:  BP 150/80  Pulse 62  Ht 5\' 6"  (1.676 m)  Wt 209 lb (94.802 kg)  BMI 33.75 kg/m2 Obese, well developed, in no acute distress HEENT: Pupils are equal round react to light accommodation extraocular movements are intact.  Neck: no JVDNo cervical lymphadenopathy. Cardiac: Regular rate and rhythm without murmurs rubs or gallops. Lungs:  clear to auscultation bilaterally, no wheezing, rhonchi or rales Abd: soft, nontender, positive bowel sounds all quadrants Ext: no lower extremity edema.  2+ radial and dorsalis pedis pulses. Skin: warm and dry Neuro:  Grossly normal   ASSESSMENT AND PLAN:  Problem List Items Addressed This Visit   Dyslipidemia- LDL 112 (Chronic)     LDL has dropped into the 40's.  Will decrease Lipitor to 40mg .     Relevant Medications      atorvastatin (LIPITOR) tablet   HTN (hypertension) - Primary     Bp has increased since stopping lisinopril due to cough.  Will change to Losartan 50mg .  She follows up with Dr. Ellyn Noble in one month.    Relevant Medications      losartan (COZAAR) tablet   Cardiomyopathy, ischemic- 30-35% on admission 10/09/13     Well compensated.  No  signs or symptoms of acute CHF.  Weight has decreased.  Has lasix for PRN use.    PAF- peri-MI. NSR on Amio at discharge     On amiodarone.  Heart RRR.    Obesity (BMI 30.0-34.9)

## 2013-11-06 NOTE — Assessment & Plan Note (Signed)
Bp has increased since stopping lisinopril due to cough.  Will change to Losartan 50mg .  She follows up with Dr. Ellyn Hack in one month.

## 2013-11-07 DIAGNOSIS — I2119 ST elevation (STEMI) myocardial infarction involving other coronary artery of inferior wall: Secondary | ICD-10-CM | POA: Diagnosis not present

## 2013-11-07 DIAGNOSIS — R0602 Shortness of breath: Secondary | ICD-10-CM | POA: Diagnosis not present

## 2013-11-07 DIAGNOSIS — R079 Chest pain, unspecified: Secondary | ICD-10-CM | POA: Diagnosis not present

## 2013-11-07 DIAGNOSIS — R269 Unspecified abnormalities of gait and mobility: Secondary | ICD-10-CM | POA: Diagnosis not present

## 2013-11-07 DIAGNOSIS — R279 Unspecified lack of coordination: Secondary | ICD-10-CM | POA: Diagnosis not present

## 2013-11-07 DIAGNOSIS — M6281 Muscle weakness (generalized): Secondary | ICD-10-CM | POA: Diagnosis not present

## 2013-11-08 ENCOUNTER — Ambulatory Visit: Payer: Medicare Other | Admitting: Cardiology

## 2013-11-09 ENCOUNTER — Encounter (HOSPITAL_COMMUNITY)
Admission: RE | Admit: 2013-11-09 | Discharge: 2013-11-09 | Disposition: A | Discharge status: Home / Self Care | Payer: Medicare Other | Source: Ambulatory Visit | Attending: Cardiology | Admitting: Cardiology

## 2013-11-09 ENCOUNTER — Ambulatory Visit: Payer: Medicare Other | Admitting: Cardiology

## 2013-11-09 DIAGNOSIS — R0902 Hypoxemia: Secondary | ICD-10-CM | POA: Insufficient documentation

## 2013-11-09 DIAGNOSIS — I2582 Chronic total occlusion of coronary artery: Secondary | ICD-10-CM | POA: Insufficient documentation

## 2013-11-09 DIAGNOSIS — Z5189 Encounter for other specified aftercare: Secondary | ICD-10-CM | POA: Insufficient documentation

## 2013-11-09 DIAGNOSIS — Z9861 Coronary angioplasty status: Secondary | ICD-10-CM | POA: Insufficient documentation

## 2013-11-09 DIAGNOSIS — I4891 Unspecified atrial fibrillation: Secondary | ICD-10-CM | POA: Insufficient documentation

## 2013-11-09 DIAGNOSIS — I10 Essential (primary) hypertension: Secondary | ICD-10-CM | POA: Insufficient documentation

## 2013-11-09 DIAGNOSIS — I509 Heart failure, unspecified: Secondary | ICD-10-CM | POA: Insufficient documentation

## 2013-11-09 DIAGNOSIS — Z87891 Personal history of nicotine dependence: Secondary | ICD-10-CM | POA: Insufficient documentation

## 2013-11-09 DIAGNOSIS — I2589 Other forms of chronic ischemic heart disease: Secondary | ICD-10-CM | POA: Insufficient documentation

## 2013-11-09 DIAGNOSIS — R570 Cardiogenic shock: Secondary | ICD-10-CM | POA: Insufficient documentation

## 2013-11-09 DIAGNOSIS — I2109 ST elevation (STEMI) myocardial infarction involving other coronary artery of anterior wall: Secondary | ICD-10-CM | POA: Insufficient documentation

## 2013-11-09 DIAGNOSIS — E785 Hyperlipidemia, unspecified: Secondary | ICD-10-CM | POA: Insufficient documentation

## 2013-11-09 DIAGNOSIS — I2510 Atherosclerotic heart disease of native coronary artery without angina pectoris: Secondary | ICD-10-CM | POA: Insufficient documentation

## 2013-11-09 DIAGNOSIS — I498 Other specified cardiac arrhythmias: Secondary | ICD-10-CM | POA: Insufficient documentation

## 2013-11-09 DIAGNOSIS — I2542 Coronary artery dissection: Secondary | ICD-10-CM | POA: Insufficient documentation

## 2013-11-09 NOTE — Progress Notes (Signed)
Cardiac Rehab Medication Review by a Pharmacist  Does the patient  feel that his/her medications are working for him/her?  YES   Has the patient been experiencing any side effects to the medications prescribed?  NO; but does report some cough and asked about possibly medications causing it.  Also expresses that it could potentially be sinus drainage.  Recommended to monitor and bring up at next doctor's visit it does not go away.   Does the patient measure his/her own blood pressure or blood glucose at home?  YES   Does the patient have any problems obtaining medications due to transportation or finances?   NO  Understanding of regimen: good Understanding of indications: good Potential of compliance: good   Pharmacist comments: Patient expressed some concern with cough that some medications may be causing.  Recommended to monitor and if does not go away to inform MD.  Takes no other OTC/Supplements/creams/ointments.  Has no additional questions at this time.  Jeronimo Norma, PharmD Clinical Pharmacist Resident Pager: [phone]     Jeronimo Norma 11/09/2013 9:05 AM

## 2013-11-13 ENCOUNTER — Telehealth: Payer: Self-pay | Admitting: *Deleted

## 2013-11-13 ENCOUNTER — Ambulatory Visit (HOSPITAL_COMMUNITY): Payer: Medicare Other

## 2013-11-13 ENCOUNTER — Encounter (HOSPITAL_COMMUNITY)
Admission: RE | Admit: 2013-11-13 | Discharge: 2013-11-13 | Disposition: A | Discharge status: Home / Self Care | Payer: Medicare Other | Source: Ambulatory Visit | Attending: Cardiology | Admitting: Cardiology

## 2013-11-13 DIAGNOSIS — I498 Other specified cardiac arrhythmias: Secondary | ICD-10-CM | POA: Diagnosis not present

## 2013-11-13 DIAGNOSIS — I10 Essential (primary) hypertension: Secondary | ICD-10-CM | POA: Diagnosis not present

## 2013-11-13 DIAGNOSIS — Z87891 Personal history of nicotine dependence: Secondary | ICD-10-CM | POA: Diagnosis not present

## 2013-11-13 DIAGNOSIS — E785 Hyperlipidemia, unspecified: Secondary | ICD-10-CM | POA: Diagnosis not present

## 2013-11-13 DIAGNOSIS — R570 Cardiogenic shock: Secondary | ICD-10-CM | POA: Diagnosis not present

## 2013-11-13 DIAGNOSIS — I2510 Atherosclerotic heart disease of native coronary artery without angina pectoris: Secondary | ICD-10-CM | POA: Diagnosis not present

## 2013-11-13 DIAGNOSIS — R0902 Hypoxemia: Secondary | ICD-10-CM | POA: Diagnosis not present

## 2013-11-13 DIAGNOSIS — I2542 Coronary artery dissection: Secondary | ICD-10-CM | POA: Diagnosis not present

## 2013-11-13 DIAGNOSIS — I509 Heart failure, unspecified: Secondary | ICD-10-CM | POA: Diagnosis not present

## 2013-11-13 DIAGNOSIS — I2589 Other forms of chronic ischemic heart disease: Secondary | ICD-10-CM | POA: Diagnosis not present

## 2013-11-13 DIAGNOSIS — Z9861 Coronary angioplasty status: Secondary | ICD-10-CM | POA: Diagnosis not present

## 2013-11-13 DIAGNOSIS — I4891 Unspecified atrial fibrillation: Secondary | ICD-10-CM | POA: Diagnosis not present

## 2013-11-13 DIAGNOSIS — I2109 ST elevation (STEMI) myocardial infarction involving other coronary artery of anterior wall: Secondary | ICD-10-CM | POA: Diagnosis not present

## 2013-11-13 DIAGNOSIS — Z5189 Encounter for other specified aftercare: Secondary | ICD-10-CM | POA: Diagnosis not present

## 2013-11-13 DIAGNOSIS — I2582 Chronic total occlusion of coronary artery: Secondary | ICD-10-CM | POA: Diagnosis not present

## 2013-11-13 NOTE — Progress Notes (Signed)
Karry began her first day of exercise this morning at cardiac rehab. Telemetry rhythm Sinus with a downward QRS and T wave inversion. Jonita used a walker for stability. Vital signs stable. Ortha says she has been depressed since her hospitalization and hopes that participating in cardiac rehab will help.  Analycia is on an antidepressant currently and denies any suicidal ideations. Annabella said she recently talked with her primary care physician about being depressed and the current medication she is taking is working for her. Patient worked at a slow pace today and was encouraged to take frequent rest breaks. Emotional support provided. Will continue to monitor the patient throughout  the program.

## 2013-11-13 NOTE — Telephone Encounter (Signed)
Returned call and pt verified x 2 w/ pt's husband, [name].  Stated he has questions.  (See concern and advice below).  MVI:  Advised otc MVI like Centrum Silver Stomach queasy: Advised take omeprazole 20 mg daily as needed (already ordered and pt has some at home) Change Lipitor r/t stomach queasy: Advised to try omeprazole x 1 week before calling back since dose was recently decreased  Husband verbalized understanding and agreed w/ plan.

## 2013-11-13 NOTE — Telephone Encounter (Signed)
Pt's husbands called stating that he can not find the multivitamin that [facility] prescribed. He stated that the pharmacies are telling him that it has been discontinued. He also has questions about her LIpitor and some other medication. Please call his cellphone at  [phone].  [place]

## 2013-11-15 ENCOUNTER — Encounter (HOSPITAL_COMMUNITY)
Admission: RE | Admit: 2013-11-15 | Discharge: 2013-11-15 | Disposition: A | Discharge status: Home / Self Care | Payer: Medicare Other | Source: Ambulatory Visit | Attending: Cardiology | Admitting: Cardiology

## 2013-11-15 DIAGNOSIS — I2109 ST elevation (STEMI) myocardial infarction involving other coronary artery of anterior wall: Secondary | ICD-10-CM | POA: Diagnosis not present

## 2013-11-15 DIAGNOSIS — Z5189 Encounter for other specified aftercare: Secondary | ICD-10-CM | POA: Diagnosis not present

## 2013-11-15 DIAGNOSIS — I2582 Chronic total occlusion of coronary artery: Secondary | ICD-10-CM | POA: Diagnosis not present

## 2013-11-15 DIAGNOSIS — I4891 Unspecified atrial fibrillation: Secondary | ICD-10-CM | POA: Diagnosis not present

## 2013-11-15 DIAGNOSIS — I2542 Coronary artery dissection: Secondary | ICD-10-CM | POA: Diagnosis not present

## 2013-11-15 DIAGNOSIS — R570 Cardiogenic shock: Secondary | ICD-10-CM | POA: Diagnosis not present

## 2013-11-17 ENCOUNTER — Encounter (HOSPITAL_COMMUNITY)
Admission: RE | Admit: 2013-11-17 | Discharge: 2013-11-17 | Disposition: A | Discharge status: Home / Self Care | Payer: Medicare Other | Source: Ambulatory Visit | Attending: Cardiology | Admitting: Cardiology

## 2013-11-17 DIAGNOSIS — I2582 Chronic total occlusion of coronary artery: Secondary | ICD-10-CM | POA: Diagnosis not present

## 2013-11-17 DIAGNOSIS — R570 Cardiogenic shock: Secondary | ICD-10-CM | POA: Diagnosis not present

## 2013-11-17 DIAGNOSIS — Z5189 Encounter for other specified aftercare: Secondary | ICD-10-CM | POA: Diagnosis not present

## 2013-11-17 DIAGNOSIS — I4891 Unspecified atrial fibrillation: Secondary | ICD-10-CM | POA: Diagnosis not present

## 2013-11-17 DIAGNOSIS — I2542 Coronary artery dissection: Secondary | ICD-10-CM | POA: Diagnosis not present

## 2013-11-17 DIAGNOSIS — I2109 ST elevation (STEMI) myocardial infarction involving other coronary artery of anterior wall: Secondary | ICD-10-CM | POA: Diagnosis not present

## 2013-11-18 DIAGNOSIS — G4733 Obstructive sleep apnea (adult) (pediatric): Secondary | ICD-10-CM | POA: Diagnosis not present

## 2013-11-18 DIAGNOSIS — I10 Essential (primary) hypertension: Secondary | ICD-10-CM | POA: Diagnosis not present

## 2013-11-20 ENCOUNTER — Encounter (HOSPITAL_COMMUNITY)
Admission: RE | Admit: 2013-11-20 | Discharge: 2013-11-20 | Disposition: A | Discharge status: Home / Self Care | Payer: Medicare Other | Source: Ambulatory Visit | Attending: Cardiology | Admitting: Cardiology

## 2013-11-20 DIAGNOSIS — L568 Other specified acute skin changes due to ultraviolet radiation: Secondary | ICD-10-CM | POA: Diagnosis not present

## 2013-11-20 DIAGNOSIS — G4701 Insomnia due to medical condition: Secondary | ICD-10-CM | POA: Diagnosis not present

## 2013-11-20 DIAGNOSIS — Z5189 Encounter for other specified aftercare: Secondary | ICD-10-CM | POA: Diagnosis not present

## 2013-11-20 DIAGNOSIS — E7800 Pure hypercholesterolemia, unspecified: Secondary | ICD-10-CM | POA: Diagnosis not present

## 2013-11-20 DIAGNOSIS — I252 Old myocardial infarction: Secondary | ICD-10-CM | POA: Diagnosis not present

## 2013-11-20 DIAGNOSIS — F3289 Other specified depressive episodes: Secondary | ICD-10-CM | POA: Diagnosis not present

## 2013-11-20 DIAGNOSIS — F329 Major depressive disorder, single episode, unspecified: Secondary | ICD-10-CM | POA: Diagnosis not present

## 2013-11-22 ENCOUNTER — Encounter (HOSPITAL_COMMUNITY)
Admission: RE | Admit: 2013-11-22 | Discharge: 2013-11-22 | Disposition: A | Discharge status: Home / Self Care | Payer: Medicare Other | Source: Ambulatory Visit | Attending: Cardiology | Admitting: Cardiology

## 2013-11-22 ENCOUNTER — Telehealth: Payer: Self-pay | Admitting: *Deleted

## 2013-11-22 DIAGNOSIS — Z5189 Encounter for other specified aftercare: Secondary | ICD-10-CM | POA: Diagnosis not present

## 2013-11-22 DIAGNOSIS — I252 Old myocardial infarction: Secondary | ICD-10-CM | POA: Diagnosis not present

## 2013-11-22 MED ORDER — NITROGLYCERIN 0.4 MG SL SUBL: 0.4000 mg | SUBLINGUAL_TABLET | SUBLINGUAL | Status: DC | PRN

## 2013-11-22 NOTE — Progress Notes (Signed)
Reviewed home exercise with pt today.  Pt plans to walk and go to gym at [place] [employer] for exercise.  Reviewed THR, pulse, RPE, sign and symptoms, and when to call 911 or MD.  Pt was not given NTG post MI, note sent to MD.  Pt voiced understanding. Alberteen Sam, MA, ACSM RCEP

## 2013-11-22 NOTE — Telephone Encounter (Signed)
Called and left message, prescription  e-sent  on answer machine.-

## 2013-11-22 NOTE — Telephone Encounter (Signed)
Message copied by Raiford Simmonds on Wed Nov 22, 2013  5:46 PM ------      Message from: Leonie Man      Created: Wed Nov 22, 2013  3:36 PM      Regarding: RE: NTG Rx       I will ask my RN to call in an Rx for PRN SL NTG.            Leonie Man, MD                        ----- Message -----         From: Clotilde Dieter         Sent: 11/22/2013  12:23 PM           To: Leonie Man, MD      Subject: NTG Rx                                                   Dr. Ellyn Hack,            I was reviewing Laporsche's home exercise today in cardiac rehab.  She mentioned that she was not given a prescription for nitroglycerin at d/c from her MI.  Please advise.            Thanks,       Alberteen Sam, MA, ACSM RCEP             ------

## 2013-11-24 ENCOUNTER — Encounter (HOSPITAL_COMMUNITY)
Admission: RE | Admit: 2013-11-24 | Discharge: 2013-11-24 | Disposition: A | Discharge status: Home / Self Care | Payer: Medicare Other | Source: Ambulatory Visit | Attending: Cardiology | Admitting: Cardiology

## 2013-11-24 DIAGNOSIS — Z5189 Encounter for other specified aftercare: Secondary | ICD-10-CM | POA: Diagnosis not present

## 2013-11-24 DIAGNOSIS — I252 Old myocardial infarction: Secondary | ICD-10-CM | POA: Diagnosis not present

## 2013-11-24 NOTE — Progress Notes (Signed)
11:15 exercise class - Received in basket that Dr. Ellyn Hack sent pt's information to his nurse Ivin Booty to send over prescription for NTG.  Pt did received the call and is planning to pick up the Ntg on her way home from exercise.Cherre Huger, BSN

## 2013-11-24 NOTE — Progress Notes (Signed)
While exercising on the stationary bike during first station of exercise.  Reviewed pt previous PHQ2 scores which were high in many areas.  Pt thought she was feeeling bettter and once in exercise she was glad she attended.  Pt was preparing to get off bike when she slipped off the seat forward.  Pt was holding on the the arms and had her feet still in the pedal straps.  Pt did not fall completely to the ground.  Pt was assisted to the chair x 2 assist.  Pt denies any complaints or areas of discomfort. Assessed feet and ankles for any presence of tears.  None noted.Pt given a cup of water and rested for a few minutes before continuing with exercise with no complaints.   Will confer with Exercise Physiologist regarding switching to another type of equipment.  Maurice Small RN BSN

## 2013-11-25 ENCOUNTER — Emergency Department (HOSPITAL_COMMUNITY): Payer: Medicare Other

## 2013-11-25 ENCOUNTER — Encounter (HOSPITAL_COMMUNITY): Payer: Self-pay | Admitting: Emergency Medicine

## 2013-11-25 ENCOUNTER — Observation Stay (HOSPITAL_COMMUNITY)
Admission: EM | Admit: 2013-11-25 | Discharge: 2013-11-26 | Disposition: A | Discharge status: Home / Self Care | Payer: Medicare Other | Attending: Internal Medicine | Admitting: Internal Medicine

## 2013-11-25 DIAGNOSIS — R0789 Other chest pain: Principal | ICD-10-CM | POA: Insufficient documentation

## 2013-11-25 DIAGNOSIS — R42 Dizziness and giddiness: Secondary | ICD-10-CM | POA: Insufficient documentation

## 2013-11-25 DIAGNOSIS — R570 Cardiogenic shock: Secondary | ICD-10-CM

## 2013-11-25 DIAGNOSIS — E669 Obesity, unspecified: Secondary | ICD-10-CM

## 2013-11-25 DIAGNOSIS — R252 Cramp and spasm: Secondary | ICD-10-CM

## 2013-11-25 DIAGNOSIS — Z9861 Coronary angioplasty status: Secondary | ICD-10-CM | POA: Insufficient documentation

## 2013-11-25 DIAGNOSIS — Z7982 Long term (current) use of aspirin: Secondary | ICD-10-CM | POA: Insufficient documentation

## 2013-11-25 DIAGNOSIS — Z955 Presence of coronary angioplasty implant and graft: Secondary | ICD-10-CM

## 2013-11-25 DIAGNOSIS — I2510 Atherosclerotic heart disease of native coronary artery without angina pectoris: Secondary | ICD-10-CM | POA: Diagnosis present

## 2013-11-25 DIAGNOSIS — R0602 Shortness of breath: Secondary | ICD-10-CM | POA: Diagnosis not present

## 2013-11-25 DIAGNOSIS — R110 Nausea: Secondary | ICD-10-CM | POA: Insufficient documentation

## 2013-11-25 DIAGNOSIS — E872 Acidosis, unspecified: Secondary | ICD-10-CM

## 2013-11-25 DIAGNOSIS — Z87891 Personal history of nicotine dependence: Secondary | ICD-10-CM | POA: Insufficient documentation

## 2013-11-25 DIAGNOSIS — I10 Essential (primary) hypertension: Secondary | ICD-10-CM | POA: Diagnosis present

## 2013-11-25 DIAGNOSIS — I2589 Other forms of chronic ischemic heart disease: Secondary | ICD-10-CM | POA: Diagnosis not present

## 2013-11-25 DIAGNOSIS — F329 Major depressive disorder, single episode, unspecified: Secondary | ICD-10-CM | POA: Diagnosis not present

## 2013-11-25 DIAGNOSIS — Z91040 Latex allergy status: Secondary | ICD-10-CM | POA: Insufficient documentation

## 2013-11-25 DIAGNOSIS — I255 Ischemic cardiomyopathy: Secondary | ICD-10-CM | POA: Diagnosis present

## 2013-11-25 DIAGNOSIS — F411 Generalized anxiety disorder: Secondary | ICD-10-CM

## 2013-11-25 DIAGNOSIS — J9600 Acute respiratory failure, unspecified whether with hypoxia or hypercapnia: Secondary | ICD-10-CM | POA: Insufficient documentation

## 2013-11-25 DIAGNOSIS — I213 ST elevation (STEMI) myocardial infarction of unspecified site: Secondary | ICD-10-CM

## 2013-11-25 DIAGNOSIS — Z79899 Other long term (current) drug therapy: Secondary | ICD-10-CM | POA: Diagnosis not present

## 2013-11-25 DIAGNOSIS — R079 Chest pain, unspecified: Secondary | ICD-10-CM | POA: Diagnosis not present

## 2013-11-25 DIAGNOSIS — I480 Paroxysmal atrial fibrillation: Secondary | ICD-10-CM | POA: Diagnosis present

## 2013-11-25 DIAGNOSIS — G43909 Migraine, unspecified, not intractable, without status migrainosus: Secondary | ICD-10-CM | POA: Insufficient documentation

## 2013-11-25 DIAGNOSIS — Z880 Allergy status to penicillin: Secondary | ICD-10-CM | POA: Diagnosis not present

## 2013-11-25 DIAGNOSIS — I252 Old myocardial infarction: Secondary | ICD-10-CM | POA: Diagnosis not present

## 2013-11-25 DIAGNOSIS — F3289 Other specified depressive episodes: Secondary | ICD-10-CM | POA: Diagnosis not present

## 2013-11-25 DIAGNOSIS — E079 Disorder of thyroid, unspecified: Secondary | ICD-10-CM | POA: Insufficient documentation

## 2013-11-25 DIAGNOSIS — E785 Hyperlipidemia, unspecified: Secondary | ICD-10-CM | POA: Diagnosis present

## 2013-11-25 DIAGNOSIS — I501 Left ventricular failure: Secondary | ICD-10-CM

## 2013-11-25 DIAGNOSIS — I951 Orthostatic hypotension: Secondary | ICD-10-CM

## 2013-11-25 LAB — APTT: aPTT: 33 seconds (ref 24–37)

## 2013-11-25 LAB — CBC
HCT: 32.7 % — ABNORMAL LOW (ref 36.0–46.0)
Hemoglobin: 10.4 g/dL — ABNORMAL LOW (ref 12.0–15.0)
MCH: 30 pg (ref 26.0–34.0)
MCHC: 31.8 g/dL (ref 30.0–36.0)
MCV: 94.2 fL (ref 78.0–100.0)
PLATELETS: 198 10*3/uL (ref 150–400)
RBC: 3.47 MIL/uL — ABNORMAL LOW (ref 3.87–5.11)
RDW: 15.9 % — AB (ref 11.5–15.5)
WBC: 6.8 10*3/uL (ref 4.0–10.5)

## 2013-11-25 LAB — POCT I-STAT TROPONIN I: Troponin i, poc: 0.03 ng/mL (ref 0.00–0.08)

## 2013-11-25 LAB — COMPREHENSIVE METABOLIC PANEL
ALBUMIN: 3 g/dL — AB (ref 3.5–5.2)
ALK PHOS: 111 U/L (ref 39–117)
ALT: 39 U/L — ABNORMAL HIGH (ref 0–35)
AST: 35 U/L (ref 0–37)
BUN: 17 mg/dL (ref 6–23)
CO2: 20 mEq/L (ref 19–32)
Calcium: 9.5 mg/dL (ref 8.4–10.5)
Chloride: 105 mEq/L (ref 96–112)
Creatinine, Ser: 0.88 mg/dL (ref 0.50–1.10)
GFR calc Af Amer: 70 mL/min — ABNORMAL LOW (ref >90)
GFR calc non Af Amer: 60 mL/min — ABNORMAL LOW (ref >90)
Glucose, Bld: 100 mg/dL — ABNORMAL HIGH (ref 70–99)
Potassium: 4 mEq/L (ref 3.7–5.3)
Sodium: 138 mEq/L (ref 137–147)
TOTAL PROTEIN: 6.5 g/dL (ref 6.0–8.3)
Total Bilirubin: 0.3 mg/dL (ref 0.3–1.2)

## 2013-11-25 LAB — TROPONIN I

## 2013-11-25 LAB — PROTIME-INR
INR: 1.03 (ref 0.00–1.49)
PROTHROMBIN TIME: 13.3 s (ref 11.6–15.2)

## 2013-11-25 LAB — PRO B NATRIURETIC PEPTIDE: PRO B NATRI PEPTIDE: 2407 pg/mL — AB (ref 0–450)

## 2013-11-25 MED ORDER — ALPRAZOLAM 0.25 MG PO TABS: 0.2500 mg | ORAL_TABLET | Freq: Three times a day (TID) | ORAL | Status: DC | PRN

## 2013-11-25 MED ORDER — CALCIUM CARBONATE-VITAMIN D 500-200 MG-UNIT PO TABS
2.0000 | ORAL_TABLET | Freq: Every day | ORAL | Status: DC
  Administered 2013-11-26: 2 via ORAL
  Filled 2013-11-25: qty 2

## 2013-11-25 MED ORDER — MULTI-VITAMIN/MINERALS PO TABS: 1.0000 | ORAL_TABLET | Freq: Every day | ORAL | Status: DC

## 2013-11-25 MED ORDER — NITROGLYCERIN 0.4 MG SL SUBL
0.4000 mg | SUBLINGUAL_TABLET | SUBLINGUAL | Status: DC | PRN
  Administered 2013-11-26: 0.4 mg via SUBLINGUAL

## 2013-11-25 MED ORDER — ATORVASTATIN CALCIUM 40 MG PO TABS
40.0000 mg | ORAL_TABLET | Freq: Every day | ORAL | Status: DC
  Filled 2013-11-25: qty 1

## 2013-11-25 MED ORDER — POLYETHYL GLYCOL-PROPYL GLYCOL 0.4-0.3 % OP SOLN: 1.0000 [drp] | OPHTHALMIC | Status: DC

## 2013-11-25 MED ORDER — PANTOPRAZOLE SODIUM 40 MG PO TBEC
40.0000 mg | DELAYED_RELEASE_TABLET | Freq: Every day | ORAL | Status: DC
  Administered 2013-11-26: 40 mg via ORAL
  Filled 2013-11-25: qty 1

## 2013-11-25 MED ORDER — LEVOTHYROXINE SODIUM 100 MCG PO TABS
100.0000 ug | ORAL_TABLET | Freq: Every day | ORAL | Status: DC
  Administered 2013-11-26: 100 ug via ORAL
  Filled 2013-11-25 (×2): qty 1

## 2013-11-25 MED ORDER — FUROSEMIDE 20 MG PO TABS
20.0000 mg | ORAL_TABLET | Freq: Once | ORAL | Status: AC
  Administered 2013-11-26: 20 mg via ORAL
  Filled 2013-11-25: qty 1

## 2013-11-25 MED ORDER — FUROSEMIDE 20 MG PO TABS
20.0000 mg | ORAL_TABLET | Freq: Every day | ORAL | Status: DC | PRN
  Filled 2013-11-25: qty 1

## 2013-11-25 MED ORDER — AMIODARONE HCL 200 MG PO TABS
200.0000 mg | ORAL_TABLET | Freq: Every day | ORAL | Status: DC
  Administered 2013-11-26: 200 mg via ORAL
  Filled 2013-11-25: qty 1

## 2013-11-25 MED ORDER — SERTRALINE HCL 50 MG PO TABS
50.0000 mg | ORAL_TABLET | Freq: Every day | ORAL | Status: DC
  Administered 2013-11-26: 50 mg via ORAL
  Filled 2013-11-25: qty 1

## 2013-11-25 MED ORDER — LORATADINE 10 MG PO TABS
10.0000 mg | ORAL_TABLET | Freq: Every day | ORAL | Status: DC
  Administered 2013-11-26: 10 mg via ORAL
  Filled 2013-11-25: qty 1

## 2013-11-25 MED ORDER — LOSARTAN POTASSIUM 50 MG PO TABS
50.0000 mg | ORAL_TABLET | Freq: Every day | ORAL | Status: DC
  Administered 2013-11-26: 50 mg via ORAL
  Filled 2013-11-25: qty 1

## 2013-11-25 MED ORDER — VITAMIN D3 25 MCG (1000 UNIT) PO TABS
1000.0000 [IU] | ORAL_TABLET | Freq: Every day | ORAL | Status: DC
  Administered 2013-11-26: 1000 [IU] via ORAL
  Filled 2013-11-25: qty 1

## 2013-11-25 MED ORDER — TICAGRELOR 90 MG PO TABS: 90.0000 mg | ORAL_TABLET | Freq: Two times a day (BID) | ORAL | Status: DC

## 2013-11-25 MED ORDER — VITAMIN E 180 MG (400 UNIT) PO CAPS
400.0000 [IU] | ORAL_CAPSULE | Freq: Every day | ORAL | Status: DC
  Administered 2013-11-26: 400 [IU] via ORAL
  Filled 2013-11-25: qty 1

## 2013-11-25 MED ORDER — CARVEDILOL 6.25 MG PO TABS
6.2500 mg | ORAL_TABLET | Freq: Two times a day (BID) | ORAL | Status: DC
  Administered 2013-11-26: 6.25 mg via ORAL
  Filled 2013-11-25 (×3): qty 1

## 2013-11-25 MED ORDER — ASPIRIN EC 81 MG PO TBEC
81.0000 mg | DELAYED_RELEASE_TABLET | Freq: Every day | ORAL | Status: DC
  Administered 2013-11-26: 81 mg via ORAL
  Filled 2013-11-25: qty 1

## 2013-11-25 MED ORDER — BIOTIN 1000 MCG PO TABS: 1000.0000 ug | ORAL_TABLET | Freq: Every day | ORAL | Status: DC

## 2013-11-25 NOTE — ED Notes (Addendum)
TO ED via [facility]-- from independent living at [facility]-- with c/o chest pain. Hx of STEMI 10/09/13-- with 2 stents placed. Currently going to cardiac rehab. Alert/oriented x 3-- pt received NTG x 2 enroute-- BP dropped to 88/60-- received 400cc bolus of NS enroute. ASA 324 given enroute also.

## 2013-11-25 NOTE — ED Notes (Signed)
Pt's husband concerned with pt's ordered dose of lasix, states the pt reacts strongly to lasix.

## 2013-11-25 NOTE — ED Provider Notes (Signed)
CSN: [id number]     Arrival date & time 11/25/13  2058 History   First MD Initiated Contact with Patient 11/25/13 2109     Chief Complaint  Patient presents with  . Chest Pain   (Consider location/radiation/quality/duration/timing/severity/associated sxs/prior Treatment) HPI Comments: Patient is an 80 yo F PMHx significant for HTN, Migraines, Thyroid Disease, Depression, STEMI involving left anterior descending coronary artery (09/2013), CAP s/p PCI, ischemic cardiomyopathy s/p STEMI, acute respiratory failure s/p STEMI presenting to the ED for central chest pressure without radiation that has been gradually worsening since around lunch time this afternoon. Patient endorses associated nausea, dizziness, and SOB different from baseline s/p PCI. Patient states her CP is a 0/10 after receiving two SL nitroglycerins and 324mg  ASA. Patient was unable to tolerate nitro as it did cause hypotension. Patient is currently on 81mg  ASA and Brilinta for stent placement. Patient is followed by Dr. Ellyn Hack, cardiology.   Patient is a 80 y.o. female presenting with chest pain.  Chest Pain Associated symptoms: dizziness, nausea and shortness of breath     Past Medical History  Diagnosis Date  . Hypertension   . Migraine headache   . Thyroid disease   . Depression   . Acute respiratory failure due to hypoxia 10/10/2013    secondary to anterior STEMI related pulmonary edema (resolved)  . ST elevation (STEMI) myocardial infarction involving left anterior descending coronary artery 10/09/13    Mid LAD occulsion s/p PCI (extensive disection)  . CAD S/P percutaneous coronary angioplasty 10/09/13    DES x 2 to Mid LAD, 2.5 x 38, 2.5 x 12 Promus (covering extensive disection), moderate RCA disesase  . Ischemic cardiomyopathy 10/16/13    Repeat Echo EF 40-45% with distal LAD hypokenesis improved from initial, post STEMI   Past Surgical History  Procedure Laterality Date  . Shoulder surgery    . Coronary  angioplasty with stent placement  10/09/13    2 overlapping Promus DES to mid LAD, occlusion/dissection (2.5 x 38, 2.5 x 12)   Family History  Problem Relation Age of Onset  . Varicose Veins Father    History  Substance Use Topics  . Smoking status: Former Research scientist (life sciences)  . Smokeless tobacco: Not on file  . Alcohol Use: No   OB History   Grav Para Term Preterm Abortions TAB SAB Ect Mult Living                 Review of Systems  Respiratory: Positive for shortness of breath.   Cardiovascular: Positive for chest pain.  Gastrointestinal: Positive for nausea.  Neurological: Positive for dizziness.  All other systems reviewed and are negative.    Allergies  Ace inhibitors; Beta adrenergic blockers; Iohexol; Atorvastatin; Latex; Penicillins; and Sulfa antibiotics  Home Medications   Current Outpatient Rx  Name  Route  Sig  Dispense  Refill  . acetaminophen (TYLENOL) 500 MG tablet   Oral   Take 500-1,000 mg by mouth every 6 (six) hours as needed for mild pain.         Marland Kitchen ALPRAZolam (XANAX) 0.25 MG tablet   Oral   Take 0.25 mg by mouth 3 (three) times daily as needed for anxiety.         Marland Kitchen amiodarone (PACERONE) 200 MG tablet   Oral   Take 200 mg by mouth 2 (two) times daily. End on 11/04/2013 then take 1(200 mg) tablets daily         . aspirin 81 MG chewable tablet   Oral  Chew 1 tablet (81 mg total) by mouth daily.         . Biotin 1000 MCG tablet   Oral   Take 1,000 mcg by mouth daily.         Marland Kitchen buPROPion (WELLBUTRIN XL) 150 MG 24 hr tablet   Oral   Take 150 mg by mouth daily.         . calcium-vitamin D (OSCAL WITH D) 500-200 MG-UNIT per tablet   Oral   Take 2 tablets by mouth daily.         . cholecalciferol (VITAMIN D) 1000 UNITS tablet   Oral   Take 1,000 Units by mouth daily.         . fexofenadine (ALLEGRA) 60 MG tablet   Oral   Take 60 mg by mouth 2 (two) times daily as needed for allergies or rhinitis.         . furosemide (LASIX) 20  MG tablet   Oral   Take 1 tablet (20 mg total) by mouth daily as needed (Weight gain of 5 lbs in a week, or 3 lbs in a day.).   30 tablet   5   . levothyroxine (SYNTHROID, LEVOTHROID) 100 MCG tablet   Oral   Take 100 mcg by mouth daily before breakfast.         . losartan (COZAAR) 50 MG tablet   Oral   Take 1 tablet (50 mg total) by mouth daily.   30 tablet   5   . Multiple Vitamins-Minerals (MULTIVITAMIN WITH MINERALS) tablet   Oral   Take 1 tablet by mouth daily.         Marland Kitchen omeprazole (PRILOSEC) 20 MG capsule   Oral   Take 20 mg by mouth daily as needed.         [name] Glycol-Propyl Glycol (SYSTANE OP)   Both Eyes   Place 1-2 drops into both eyes 3 (three) times daily as needed (for dry eye or irritation).         . sertraline (ZOLOFT) 50 MG tablet   Oral   Take 50 mg by mouth daily.         . Ticagrelor (BRILINTA) 90 MG TABS tablet   Oral   Take 1 tablet (90 mg total) by mouth 2 (two) times daily.   60 tablet   5   . vitamin E (VITAMIN E) 400 UNIT capsule   Oral   Take 400 Units by mouth daily.         . nitroGLYCERIN (NITROSTAT) 0.4 MG SL tablet   Sublingual   Place 1 tablet (0.4 mg total) under the tongue every 5 (five) minutes as needed for chest pain.   25 tablet   6    BP 112/64  Pulse 59  Temp(Src) 97.8 F (36.6 C) (Oral)  Resp 21  SpO2 97% Physical Exam  Constitutional: She is oriented to person, place, and time. She appears well-developed and well-nourished. No distress. Nasal cannula in place.  HENT:  Head: Normocephalic and atraumatic.  Right Ear: External ear normal.  Left Ear: External ear normal.  Nose: Nose normal.  Mouth/Throat: Oropharynx is clear and moist. No oropharyngeal exudate.  Eyes: Conjunctivae are normal.  Neck: Neck supple.  Cardiovascular: Normal rate, regular rhythm, normal heart sounds and intact distal pulses.   Pulmonary/Chest: Effort normal and breath sounds normal. No respiratory distress. She  exhibits no tenderness.  Abdominal: Soft. There is no tenderness.  Musculoskeletal: Normal range  of motion. She exhibits no edema.  Neurological: She is alert and oriented to person, place, and time.  Skin: Skin is warm and dry. She is not diaphoretic.    ED Course  Procedures (including critical care time) Medications  aspirin EC tablet 81 mg (not administered)  ALPRAZolam (XANAX) tablet 0.25 mg (not administered)  amiodarone (PACERONE) tablet 200 mg (not administered)  atorvastatin (LIPITOR) tablet 40 mg (not administered)  Biotin 1 mg (not administered)  calcium-vitamin D (OSCAL WITH D) 500-200 MG-UNIT per tablet 2 tablet (not administered)  carvedilol (COREG) tablet 6.25 mg (not administered)  cholecalciferol (VITAMIN D) tablet 1,000 Units (not administered)  furosemide (LASIX) tablet 20 mg (not administered)  levothyroxine (SYNTHROID, LEVOTHROID) tablet 100 mcg (not administered)  losartan (COZAAR) tablet 50 mg (not administered)  nitroGLYCERIN (NITROSTAT) SL tablet 0.4 mg (not administered)  multivitamin with minerals tablet 1 tablet (not administered)  pantoprazole (PROTONIX) EC tablet 40 mg (not administered)  Polyethyl Glycol-Propyl Glycol 0.4-0.3 % SOLN 1 drop (not administered)  sertraline (ZOLOFT) tablet 50 mg (not administered)  Ticagrelor (BRILINTA) tablet 90 mg (not administered)  vitamin E capsule 400 Units (not administered)  loratadine (CLARITIN) tablet 10 mg (not administered)  furosemide (LASIX) tablet 20 mg (20 mg Oral Given 11/26/13 0008)    Labs Review Labs Reviewed  PRO B NATRIURETIC PEPTIDE - Abnormal; Notable for the following:    Pro B Natriuretic peptide (BNP) 2407.0 (*)    All other components within normal limits  CBC - Abnormal; Notable for the following:    RBC 3.47 (*)    Hemoglobin 10.4 (*)    HCT 32.7 (*)    RDW 15.9 (*)    All other components within normal limits  COMPREHENSIVE METABOLIC PANEL - Abnormal; Notable for the following:     Glucose, Bld 100 (*)    Albumin 3.0 (*)    ALT 39 (*)    GFR calc non Af Amer 60 (*)    GFR calc Af Amer 70 (*)    All other components within normal limits  PROTIME-INR  APTT  TROPONIN I  TROPONIN I  TROPONIN I  TROPONIN I  POCT I-STAT TROPONIN I   Imaging Review Dg Chest Portable 1 View  11/25/2013   CLINICAL DATA:  Chest pain and shortness of breath.  EXAM: PORTABLE CHEST - 1 VIEW  COMPARISON:  10/11/2013  FINDINGS: Endotracheal and enteric tubes have been removed. The cardiac silhouette remains enlarged. Thoracic aorta is tortuous. There are patchy perihilar and bibasilar opacities bilaterally. A small left pleural effusion is questioned. There is no evidence of pneumothorax. No acute osseous abnormality is identified.  IMPRESSION: New perihilar and bibasilar opacities, concerning for edema. Possible small left pleural effusion.   Electronically Signed   By: Logan Bores   On: 11/25/2013 21:58    EKG Interpretation    Date/Time:  Saturday November 25 2013 21:05:16 EST Ventricular Rate:  62 PR Interval:  210 QRS Duration: 100 QT Interval:  453 QTC Calculation: 460 R Axis:   -55 Text Interpretation:  Age not entered, assumed to be  80 years old for purpose of ECG interpretation Sinus rhythm Prolonged PR interval Left anterior fascicular block Probable anterior infarct, age indeterminate Lateral leads are also involved Confirmed by [facility]  MD, DAVID ([id number]) on 11/25/2013 9:19:49 PM            MDM   1. Chest pain    Filed Vitals:   11/26/13 0000  BP: 112/64  Pulse:  59  Temp:   Resp: 21   Afebrile, NAD, non-toxic appearing, AAOx4.   Patient initially was not agreeable to admission, after a long discussion about risks vs benefits of staying and going home patient and her husband are amendable to admission for CP r/o. Patient is also anxious about being placed on Lasix as she states she has adverse reactions during hospitalization in December, will just start with PO home  dose of Lasix for pulmonary edema noted on CXR. Patient is agreeable to the PO dose.   Concern for cardiac etiology of Chest Pain. Hospitalist has been consulted and will see patient in the ED for likely admit. Pt does not meet criteria for CP protocol and a further evaluation is recommended. Pt has been re-evaluated prior to consult and VSS, NAD, heart RRR, pain 0/10, lungs CTAB. Some nonspecific changes found on EKG from previous EKG, and first round of cardiac enzymes negative. This case was discussed with Dr. Curly Rim who has seen the patient and agrees with plan to admit.      Harlow Mares, PA-C 11/26/13 [phone]

## 2013-11-25 NOTE — ED Notes (Signed)
PA at BS.  

## 2013-11-26 DIAGNOSIS — I10 Essential (primary) hypertension: Secondary | ICD-10-CM

## 2013-11-26 DIAGNOSIS — I509 Heart failure, unspecified: Secondary | ICD-10-CM | POA: Diagnosis not present

## 2013-11-26 DIAGNOSIS — G43909 Migraine, unspecified, not intractable, without status migrainosus: Secondary | ICD-10-CM | POA: Diagnosis not present

## 2013-11-26 DIAGNOSIS — E079 Disorder of thyroid, unspecified: Secondary | ICD-10-CM | POA: Diagnosis not present

## 2013-11-26 DIAGNOSIS — E785 Hyperlipidemia, unspecified: Secondary | ICD-10-CM

## 2013-11-26 DIAGNOSIS — R079 Chest pain, unspecified: Secondary | ICD-10-CM

## 2013-11-26 DIAGNOSIS — R252 Cramp and spasm: Secondary | ICD-10-CM

## 2013-11-26 DIAGNOSIS — I4891 Unspecified atrial fibrillation: Secondary | ICD-10-CM

## 2013-11-26 DIAGNOSIS — R0789 Other chest pain: Secondary | ICD-10-CM | POA: Diagnosis not present

## 2013-11-26 DIAGNOSIS — I2510 Atherosclerotic heart disease of native coronary artery without angina pectoris: Secondary | ICD-10-CM | POA: Diagnosis not present

## 2013-11-26 DIAGNOSIS — I2589 Other forms of chronic ischemic heart disease: Secondary | ICD-10-CM | POA: Diagnosis not present

## 2013-11-26 LAB — TROPONIN I
Troponin I: <0.3 ng/mL (ref <0.30)
Troponin I: <0.3 ng/mL (ref <0.30)

## 2013-11-26 LAB — CBC
HCT: 35.2 % — ABNORMAL LOW (ref 36.0–46.0)
HEMOGLOBIN: 11.2 g/dL — AB (ref 12.0–15.0)
MCH: 30.3 pg (ref 26.0–34.0)
MCHC: 31.8 g/dL (ref 30.0–36.0)
MCV: 95.1 fL (ref 78.0–100.0)
Platelets: 213 10*3/uL (ref 150–400)
RBC: 3.7 MIL/uL — ABNORMAL LOW (ref 3.87–5.11)
RDW: 16.1 % — ABNORMAL HIGH (ref 11.5–15.5)
WBC: 6.7 10*3/uL (ref 4.0–10.5)

## 2013-11-26 LAB — BASIC METABOLIC PANEL
BUN: 13 mg/dL (ref 6–23)
CALCIUM: 9.5 mg/dL (ref 8.4–10.5)
CO2: 22 mEq/L (ref 19–32)
Chloride: 102 mEq/L (ref 96–112)
Creatinine, Ser: 0.87 mg/dL (ref 0.50–1.10)
GFR calc Af Amer: 70 mL/min — ABNORMAL LOW (ref >90)
GFR calc non Af Amer: 61 mL/min — ABNORMAL LOW (ref >90)
GLUCOSE: 148 mg/dL — AB (ref 70–99)
POTASSIUM: 4 meq/L (ref 3.7–5.3)
Sodium: 137 mEq/L (ref 137–147)

## 2013-11-26 MED ORDER — OXYCODONE HCL 5 MG PO TABS: 5.0000 mg | ORAL_TABLET | ORAL | Status: DC | PRN

## 2013-11-26 MED ORDER — SODIUM CHLORIDE 0.9 % IJ SOLN: 3.0000 mL | Freq: Two times a day (BID) | INTRAMUSCULAR | Status: DC

## 2013-11-26 MED ORDER — SODIUM CHLORIDE 0.9 % IV SOLN: 250.0000 mL | INTRAVENOUS | Status: DC | PRN

## 2013-11-26 MED ORDER — ACETAMINOPHEN 325 MG PO TABS
650.0000 mg | ORAL_TABLET | Freq: Four times a day (QID) | ORAL | Status: DC | PRN
  Administered 2013-11-26 (×2): 650 mg via ORAL
  Filled 2013-11-26 (×2): qty 2

## 2013-11-26 MED ORDER — ONDANSETRON HCL 4 MG PO TABS: 4.0000 mg | ORAL_TABLET | Freq: Four times a day (QID) | ORAL | Status: DC | PRN

## 2013-11-26 MED ORDER — ADULT MULTIVITAMIN W/MINERALS CH
1.0000 | ORAL_TABLET | Freq: Every day | ORAL | Status: DC
  Administered 2013-11-26: 1 via ORAL
  Filled 2013-11-26: qty 1

## 2013-11-26 MED ORDER — ENOXAPARIN SODIUM 40 MG/0.4ML ~~LOC~~ SOLN
40.0000 mg | SUBCUTANEOUS | Status: DC
Start: 2013-11-26 — End: 2013-11-26
  Filled 2013-11-26: qty 0.4

## 2013-11-26 MED ORDER — AMIODARONE HCL 200 MG PO TABS: 200.0000 mg | ORAL_TABLET | Freq: Every day | ORAL | Status: DC

## 2013-11-26 MED ORDER — ZOLPIDEM TARTRATE 5 MG PO TABS: 5.0000 mg | ORAL_TABLET | Freq: Every evening | ORAL | Status: DC | PRN

## 2013-11-26 MED ORDER — HYDROMORPHONE HCL PF 1 MG/ML IJ SOLN: 0.5000 mg | INTRAMUSCULAR | Status: DC | PRN

## 2013-11-26 MED ORDER — ACETAMINOPHEN 650 MG RE SUPP: 650.0000 mg | Freq: Four times a day (QID) | RECTAL | Status: DC | PRN

## 2013-11-26 MED ORDER — ALUM & MAG HYDROXIDE-SIMETH 200-200-20 MG/5ML PO SUSP: 30.0000 mL | Freq: Four times a day (QID) | ORAL | Status: DC | PRN

## 2013-11-26 MED ORDER — SODIUM CHLORIDE 0.9 % IJ SOLN: 3.0000 mL | INTRAMUSCULAR | Status: DC | PRN

## 2013-11-26 MED ORDER — TICAGRELOR 90 MG PO TABS
90.0000 mg | ORAL_TABLET | Freq: Two times a day (BID) | ORAL | Status: DC
  Administered 2013-11-26: 90 mg via ORAL
  Filled 2013-11-26 (×2): qty 1

## 2013-11-26 MED ORDER — POLYVINYL ALCOHOL 1.4 % OP SOLN: 1.0000 [drp] | OPHTHALMIC | Status: DC | PRN

## 2013-11-26 MED ORDER — ONDANSETRON HCL 4 MG/2ML IJ SOLN: 4.0000 mg | Freq: Four times a day (QID) | INTRAMUSCULAR | Status: DC | PRN

## 2013-11-26 NOTE — ED Provider Notes (Signed)
Medical screening examination/treatment/procedure(s) were conducted as a shared visit with non-physician practitioner(s) and myself.  I personally evaluated the patient during the encounter.    Chest pain in a patient with significant risk factors he was recently stented. EKG abnormal however not clear for STEMI. Troponins negative. Pleural effusions noted on chest x-ray. Admit for further evaluation the  Elmer Sow, MD 11/26/13 1547

## 2013-11-26 NOTE — H&P (Signed)
[facility] History and Physical  BIANA COUSENS [id number] DOB: 12-15-1929 DOA: 11/25/2013  Referring physician: EDP PCP: Henrine Screws, MD  Specialists:   Chief Complaint:  Chest Pain  HPI: KAISHA CULLIGAN is a 80 y.o. female with an history of CAD S/P PTI with Stent placements 09/2013 after a NSTEMI who presents to the ED with complaints of  7/10 SSCP and SOB that started at noon today.   The pain had a gradual onset and was associated with SOB and nausea.  EMS was called and she was given 2 NTG and an Aspirin and the pain was relieved.  She was taken to the [facility] ED for further evalauation.  In the ED, she was found to have EKG changes that were not seen on her last EKG, however her troponin level was at 0.03.  She was referred for medical admission.  Her Cardiologist is Dr. Ellyn Hack.  She is currently undergoing cardiac Rehab since her NSTEMI, and is living at the [facility].     Review of Systems:  Constitutional: No Weight Loss, No Weight Gain, Night Sweats, Fevers, Chills, Fatigue, or Generalized Weakness HEENT: No Headaches, Difficulty Swallowing,Tooth/Dental Problems,Sore Throat,  No Sneezing, Rhinitis, Ear Ache, Nasal Congestion, or Post Nasal Drip,  Cardio-vascular:  +Chest Pain, Orthopnea, PND, Edema in lower extremities, Anasarca, Dizziness, Palpitations  Resp:  +Dyspnea, No DOE, No Productive Cough, No Non-Productive Cough, No Hemoptysis, No Change in Color of Mucus,  No Wheezing.    GI: No Heartburn, Indigestion, Abdominal Pain, +Nausea, Vomiting, Diarrhea, Change in Bowel Habits,  Loss of Appetite  GU: No Dysuria, Change in Color of Urine, No Urgency or Frequency.  No flank pain.  Musculoskeletal: No Joint Pain or Swelling.  No Decreased Range of Motion. No Back Pain.  Neurologic: No Syncope, No Seizures, Muscle Weakness, Paresthesia, Vision Disturbance or Loss, No Diplopia, No Vertigo, No Difficulty Walking,  Skin: No Rash or Lesions. Psych: No  Change in Mood or Affect. No Depression or Anxiety. No Memory loss. No Confusion or Hallucinations   Past Medical History  Diagnosis Date  . Hypertension   . Migraine headache   . Thyroid disease   . Depression   . Acute respiratory failure due to hypoxia 10/10/2013    secondary to anterior STEMI related pulmonary edema (resolved)  . ST elevation (STEMI) myocardial infarction involving left anterior descending coronary artery 10/09/13    Mid LAD occulsion s/p PCI (extensive disection)  . CAD S/P percutaneous coronary angioplasty 10/09/13    DES x 2 to Mid LAD, 2.5 x 38, 2.5 x 12 Promus (covering extensive disection), moderate RCA disesase  . Ischemic cardiomyopathy 10/16/13    Repeat Echo EF 40-45% with distal LAD hypokenesis improved from initial, post STEMI      Past Surgical History  Procedure Laterality Date  . Shoulder surgery    . Coronary angioplasty with stent placement  10/09/13    2 overlapping Promus DES to mid LAD, occlusion/dissection (2.5 x 38, 2.5 x 12)       Prior to Admission medications   Medication Sig Start Date End Date Taking? Authorizing Provider  acetaminophen (TYLENOL) 500 MG tablet Take 500-1,000 mg by mouth every 6 (six) hours as needed for mild pain.   Yes Historical Provider, MD  ALPRAZolam (XANAX) 0.25 MG tablet Take 0.25 mg by mouth 3 (three) times daily as needed for anxiety. 10/26/13  Yes Claudette Jeri Cos, NP  amiodarone (PACERONE) 200 MG tablet Take 200 mg  by mouth 2 (two) times daily. End on 11/04/2013 then take 1(200 mg) tablets daily   Yes Brittainy Simmons, PA-C  aspirin 81 MG chewable tablet Chew 1 tablet (81 mg total) by mouth daily. 10/17/13  Yes Brittainy Rosita Fire, PA-C  Biotin 1000 MCG tablet Take 1,000 mcg by mouth daily.   Yes Historical Provider, MD  buPROPion (WELLBUTRIN XL) 150 MG 24 hr tablet Take 150 mg by mouth daily.   Yes Historical Provider, MD  calcium-vitamin D (OSCAL WITH D) 500-200 MG-UNIT per tablet Take 2 tablets by mouth  daily.   Yes Historical Provider, MD  cholecalciferol (VITAMIN D) 1000 UNITS tablet Take 1,000 Units by mouth daily.   Yes Historical Provider, MD  fexofenadine (ALLEGRA) 60 MG tablet Take 60 mg by mouth 2 (two) times daily as needed for allergies or rhinitis.   Yes Historical Provider, MD  furosemide (LASIX) 20 MG tablet Take 1 tablet (20 mg total) by mouth daily as needed (Weight gain of 5 lbs in a week, or 3 lbs in a day.). 10/25/13  Yes Rhonda G Barrett, PA-C  levothyroxine (SYNTHROID, LEVOTHROID) 100 MCG tablet Take 100 mcg by mouth daily before breakfast.   Yes Historical Provider, MD  losartan (COZAAR) 50 MG tablet Take 1 tablet (50 mg total) by mouth daily. 11/06/13  Yes Tarri Fuller, PA-C  Multiple Vitamins-Minerals (MULTIVITAMIN WITH MINERALS) tablet Take 1 tablet by mouth daily.   Yes Historical Provider, MD  omeprazole (PRILOSEC) 20 MG capsule Take 20 mg by mouth daily as needed.   Yes Historical Provider, MD  Polyethyl Glycol-Propyl Glycol (SYSTANE OP) Place 1-2 drops into both eyes 3 (three) times daily as needed (for dry eye or irritation).   Yes Historical Provider, MD  sertraline (ZOLOFT) 50 MG tablet Take 50 mg by mouth daily.   Yes Historical Provider, MD  Ticagrelor (BRILINTA) 90 MG TABS tablet Take 1 tablet (90 mg total) by mouth 2 (two) times daily. 10/17/13  Yes Brittainy Simmons, PA-C  vitamin E (VITAMIN E) 400 UNIT capsule Take 400 Units by mouth daily.   Yes Historical Provider, MD  nitroGLYCERIN (NITROSTAT) 0.4 MG SL tablet Place 1 tablet (0.4 mg total) under the tongue every 5 (five) minutes as needed for chest pain. 11/22/13   Leonie Man, MD      Allergies  Allergen Reactions  . Ace Inhibitors Anaphylaxis  . Beta Adrenergic Blockers Anaphylaxis  . Iohexol Anaphylaxis     Code: HIVES, Desc: anaphylactic shock s/p contrast injection many yrs ago--suggested that pt NEVER have iv contrast//a.c., Onset Date: [id number]   . Atorvastatin Rash  . Latex Rash  . Penicillins  Swelling and Rash  . Sulfa Antibiotics Rash     Social History:  reports that she has quit smoking. She does not have any smokeless tobacco history on file. She reports that she does not drink alcohol or use illicit drugs.     Family History  Problem Relation Age of Onset  . Varicose Veins Father        Physical Exam:  GEN:  Pleasant  Elderly Obese  80 y.o. Caucasian female  examined and in no acute distress; cooperative with exam Filed Vitals:   11/25/13 2111 11/25/13 2117 11/25/13 2247 11/26/13 0000  BP:  123/51 114/68 112/64  Pulse:  59 58 59  Temp:  97.8 F (36.6 C)    TempSrc:  Oral    Resp:  20 21 21   SpO2: 96% 98% 100% 97%   Blood pressure 112/64,  pulse 59, temperature 97.8 F (36.6 C), temperature source Oral, resp. rate 21, SpO2 97.00%. PSYCH: She is alert and oriented x4; does not appear anxious does not appear depressed; affect is normal HEENT: Normocephalic and Atraumatic, Mucous membranes pink; PERRLA; EOM intact; Fundi:  Benign;  No scleral icterus, Nares: Patent, Oropharynx: Clear, Fair Dentition, Neck:  FROM, no cervical lymphadenopathy nor thyromegaly or carotid bruit; no JVD; Breasts:: Not examined CHEST WALL: No tenderness CHEST: Normal respiration, clear to auscultation bilaterally HEART: Regular rate and rhythm; no murmurs rubs or gallops BACK: No kyphosis or scoliosis; no CVA tenderness ABDOMEN: Positive Bowel Sounds,  Obese, soft non-tender; no masses, no organomegaly, no pannus; no intertriginous candida. Rectal Exam: Not done EXTREMITIES: No cyanosis, clubbing or edema; no ulcerations. Genitalia: not examined PULSES: 2+ and symmetric SKIN: Normal hydration no rash or ulceration CNS:  Alert x Oriented X 4,  CN II-XII Intact,  Sensory and Motor Fxn: Intact,  Gait  Deferred Vascular: pulses palpable throughout    Labs on Admission:  Basic Metabolic Panel:  Recent Labs Lab 11/25/13 2145  NA 138  K 4.0  CL 105  CO2 20  GLUCOSE 100*  BUN 17   CREATININE 0.88  CALCIUM 9.5   Liver Function Tests:  Recent Labs Lab 11/25/13 2145  AST 35  ALT 39*  ALKPHOS 111  BILITOT 0.3  PROT 6.5  ALBUMIN 3.0*   No results found for this basename: LIPASE, AMYLASE,  in the last 168 hours No results found for this basename: AMMONIA,  in the last 168 hours CBC:  Recent Labs Lab 11/25/13 2145  WBC 6.8  HGB 10.4*  HCT 32.7*  MCV 94.2  PLT 198   Cardiac Enzymes:  Recent Labs Lab 11/25/13 2145  TROPONINI <0.30    BNP (last 3 results)  Recent Labs  10/11/13 0500 11/25/13 2145  PROBNP 2172.0* 2407.0*   CBG: No results found for this basename: GLUCAP,  in the last 168 hours  Radiological Exams on Admission: Dg Chest Portable 1 View  11/25/2013   CLINICAL DATA:  Chest pain and shortness of breath.  EXAM: PORTABLE CHEST - 1 VIEW  COMPARISON:  10/11/2013  FINDINGS: Endotracheal and enteric tubes have been removed. The cardiac silhouette remains enlarged. Thoracic aorta is tortuous. There are patchy perihilar and bibasilar opacities bilaterally. A small left pleural effusion is questioned. There is no evidence of pneumothorax. No acute osseous abnormality is identified.  IMPRESSION: New perihilar and bibasilar opacities, concerning for edema. Possible small left pleural effusion.   Electronically Signed   By: Logan Bores   On: 11/25/2013 21:58      EKG: Independently reviewed. Normal sinus Rhythm, Rate 62   Q waves in Anterior Leads, Seen on previous EKGs.      Assessment/Plan:   80 y.o. female with  Principal Problem:   Chest pain Active Problems:   HTN (hypertension)   Cardiomyopathy, ischemic- 30-35% on admission 10/09/13   Dyslipidemia- LDL 112   PAF- peri-MI. NSR on Amio at discharge   CAD (coronary artery disease)    Hypothyroid     1.   Chest Pain/ CAD hx-  Cardiac monitoring, cycle Troponins, placed on Nitropaste, O2 ASA RX, and continue B-Blocker Rx and ARB Rx and Ticagrelor.  Notify Cards in AM.    2.    Cardiomyopathy-  Lasix IV X1 dose given in ED,  Monitor for S/Sxs of Fluid Overload.    3.   PAF-  On Amiodarone Rx, and ASA.  Monitor.    4.   HTN-  Continue Metoprolol, Losartan and lasix Rx.     5.   Hypothyroid- continue Levothyroxine, check TSH level.    6.   Dyslipidemia-   Check Fasting Lipids, However Allergic To Statins.     7.   DVT Prophylaxis with Lovenox.        Code Status:  FULL CODE  Family Communication:   Husband at Bedside  Disposition Plan:     Observation  Time spent:  [facility] Pager [phone]  If 7PM-7AM, please contact night-coverage [email or web address] Password [facility] 11/26/2013, 12:47 AM

## 2013-11-26 NOTE — ED Notes (Signed)
Attempted to give report 

## 2013-11-26 NOTE — Progress Notes (Signed)
Utilization Review Completed.Donne Anon T2/05/2014

## 2013-11-26 NOTE — Discharge Summary (Signed)
Physician Discharge Summary  Melinda Noble HWE:[id number] DOB: 07-24-1930 DOA: 11/25/2013  PCP: Henrine Screws, MD  Admit date: 11/25/2013 Discharge date: 11/26/2013  Time spent: 35 minutes  Recommendations for Outpatient Follow-up:  1. Cardiology as sceduled  Discharge Diagnoses:  Principal Problem:   Chest pain Active Problems:   HTN (hypertension)   Cardiomyopathy, ischemic- 30-35% on admission 10/09/13   Dyslipidemia- LDL 112   PAF- peri-MI. NSR on Amio at discharge   CAD (coronary artery disease)   Discharge Condition: improved  Diet recommendation: cardiac  Filed Weights   11/26/13 0147  Weight: 88.724 kg (195 lb 9.6 oz)    History of present illness:  Melinda Noble is a 80 y.o. female with an history of CAD S/P PTI with Stent placements 09/2013 after a NSTEMI who presents to the ED with complaints of 7/10 SSCP and SOB that started at noon today. The pain had a gradual onset and was associated with SOB and nausea. EMS was called and she was given 2 NTG and an Aspirin and the pain was relieved. She was taken to the [facility] ED for further evalauation. In the ED, she was found to have EKG changes that were not seen on her last EKG, however her troponin level was at 0.03. She was referred for medical admission. Her Cardiologist is Dr. Ellyn Hack. She is currently undergoing cardiac Rehab since her NSTEMI, and is living at the [facility].    Hospital Course:  Seen by cardiology and Pain atypical Resolved with palpable pain in trapezium muscle this am. R/O and ECG no change form 1/6 Continue current meds Ok to d/c Already has f/u with Dr Ellyn Hack  Continue DAT for a year   Procedures:    Consultations:  cards  Discharge Exam: Filed Vitals:   11/26/13 0559  BP: 118/39  Pulse: 59  Temp: 97.7 F (36.5 C)  Resp: 15     Discharge Instructions  Discharge Orders   Future Appointments Provider Department Dept Phone   11/27/2013 11:15 AM [facility] Monitor [place] [phone]   11/29/2013 11:15 AM [facility] Monitor [place] [phone]   12/01/2013 11:15 AM [facility] [place] [phone]   12/04/2013 11:15 AM [facility] [place] [phone]   12/06/2013 11:15 AM [facility] Monitor [place] [phone]   12/08/2013 11:15 AM [facility] [place] [phone]   12/11/2013 11:15 AM [facility] [place] [phone]   12/12/2013 7:30 AM Leonie Man, MD [facility] [phone]   12/13/2013 11:15 AM [facility] Monitor [place] [phone]   12/15/2013 11:15 AM [facility] Monitor [place] [phone]   12/18/2013 11:15 AM [facility] Monitor [place] [phone]   12/20/2013 11:15 AM [facility] Monitor [place] [phone]   12/22/2013 11:15 AM [facility] Monitor [place] [phone]   12/25/2013 11:15 AM [facility] Monitor [place] [phone]   12/27/2013 11:15 AM [facility] Monitor [place] [phone]   12/29/2013 11:15 AM [facility] Monitor [place] [phone]   01/01/2014 11:15 AM [facility] Monitor [place] [phone]  01/03/2014 11:15 AM [facility] Monitor [place] [phone]   01/05/2014 11:15 AM [facility] Monitor [place] [phone]   01/08/2014 11:15 AM [facility] Monitor [place] [phone]   01/10/2014 11:15 AM [facility] Monitor [place] [phone]   01/12/2014 11:15 AM [facility] Monitor [place] [phone]   01/15/2014 11:15 AM [facility] Monitor [place] [phone]   01/17/2014 11:15 AM [facility] Monitor [place] [phone]   01/19/2014 11:15 AM [facility] Monitor [place] [phone]   01/22/2014 11:15 AM [facility] Monitor [place] [phone]   01/24/2014 11:15 AM [facility] Monitor [place] [phone]   01/26/2014 11:15 AM [facility] Monitor [place] [phone]   01/29/2014 11:15 AM [facility] Monitor [place] [phone]   01/31/2014 11:15 AM [facility] Monitor [place] [phone]   02/02/2014 11:15 AM [facility] Monitor [employer] [phone]   02/05/2014 11:15 AM [facility] Monitor [place] [phone]   02/07/2014 11:15 AM [facility] Monitor [place] [phone]   02/09/2014 11:15 AM [facility] Monitor [place] [phone]   02/12/2014 11:15 AM [facility] Monitor [place] [phone]   02/14/2014 11:15 AM [facility] Monitor [place] [phone]   02/16/2014 11:15 AM [facility] Monitor [place] [phone]   02/19/2014 11:15 AM [facility] Monitor [place] [facility] [phone]   Future Orders Complete By Expires   Diet - low sodium heart healthy  As directed    Discharge instructions  As directed    Comments:     Could try heat/muscle cream on back where spasms occur   Increase activity slowly  As directed         Medication List         acetaminophen 500 MG tablet  Commonly known as:  TYLENOL  Take 500-1,000 mg by mouth every 6 (six) hours as needed for mild pain.     ALPRAZolam 0.25 MG tablet  Commonly known as:  XANAX  Take 0.25 mg by mouth 3 (three) times daily as needed for anxiety.     amiodarone 200 MG tablet  Commonly known as:  PACERONE  Take 1 tablet (200 mg total) by mouth daily.     aspirin 81 MG chewable tablet  Chew 1 tablet (81 mg total) by mouth daily.     Biotin 1000 MCG tablet  Take 1,000 mcg by mouth daily.     buPROPion 150 MG 24 hr tablet  Commonly known as:  WELLBUTRIN XL  Take 150 mg by mouth daily.     calcium-vitamin D 500-200 MG-UNIT per tablet  Commonly known as:  OSCAL WITH D  Take 2 tablets by mouth daily.     cholecalciferol 1000 UNITS tablet  Commonly known as:  VITAMIN D  Take 1,000 Units by mouth daily.     fexofenadine 60 MG tablet  Commonly known as:  ALLEGRA  Take 60 mg by mouth 2 (two) times daily as needed for allergies or rhinitis.     furosemide  20 MG tablet  Commonly known as:  LASIX  Take 1 tablet (20 mg total) by mouth daily as needed (Weight gain of 5 lbs in a week, or 3 lbs in a day.).     levothyroxine 100 MCG tablet  Commonly known as:  SYNTHROID, LEVOTHROID  Take 100 mcg by mouth daily before breakfast.     losartan 50 MG tablet  Commonly known as:  COZAAR  Take 1 tablet (50 mg total) by mouth daily.     multivitamin with minerals tablet  Take 1 tablet by mouth daily.     nitroGLYCERIN 0.4 MG SL tablet  Commonly known as:  NITROSTAT  Place 1 tablet (0.4 mg total) under the tongue every 5 (five) minutes as needed for chest pain.     omeprazole 20 MG capsule  Commonly known as:  PRILOSEC  Take 20 mg by mouth daily as needed.     sertraline 50 MG tablet  Commonly known as:  ZOLOFT  Take 50 mg by mouth daily.     SYSTANE OP  Place 1-2 drops into both eyes 3 (three) times daily as needed (for dry eye or  irritation).     Ticagrelor 90 MG Tabs tablet  Commonly known as:  BRILINTA  Take 1 tablet (90 mg total) by mouth 2 (two) times daily.     vitamin E 400 UNIT capsule  Generic drug:  vitamin E  Take 400 Units by mouth daily.       Allergies  Allergen Reactions  . Ace Inhibitors Anaphylaxis  . Beta Adrenergic Blockers Anaphylaxis  . Iohexol Anaphylaxis     Code: HIVES, Desc: anaphylactic shock s/p contrast injection many yrs ago--suggested that pt NEVER have iv contrast//a.c., Onset Date: [id number]   . Atorvastatin Rash  . Latex Rash  . Penicillins Swelling and Rash  . Sulfa Antibiotics Rash      The results of significant diagnostics from this hospitalization (including imaging, microbiology, ancillary and laboratory) are listed below for reference.    Significant Diagnostic Studies: Dg Chest Portable 1 View  11/25/2013   CLINICAL DATA:  Chest pain and shortness of breath.  EXAM: PORTABLE CHEST - 1 VIEW  COMPARISON:  10/11/2013  FINDINGS: Endotracheal and enteric tubes have been removed. The cardiac silhouette remains enlarged. Thoracic aorta is tortuous. There are patchy perihilar and bibasilar opacities bilaterally. A small left pleural effusion is questioned. There is no evidence of pneumothorax. No acute osseous abnormality is identified.  IMPRESSION: New perihilar and bibasilar opacities, concerning for edema. Possible small left pleural effusion.   Electronically Signed   By: Logan Bores   On: 11/25/2013 21:58    Microbiology: No results found for this or any previous visit (from the past 240 hour(s)).   Labs: Basic Metabolic Panel:  Recent Labs Lab 11/25/13 2145  NA 138  K 4.0  CL 105  CO2 20  GLUCOSE 100*  BUN 17  CREATININE 0.88  CALCIUM 9.5   Liver Function Tests:  Recent Labs Lab 11/25/13 2145  AST 35  ALT 39*  ALKPHOS 111  BILITOT 0.3  PROT 6.5  ALBUMIN 3.0*   No results found for this basename: LIPASE, AMYLASE,  in the last 168 hours No  results found for this basename: AMMONIA,  in the last 168 hours CBC:  Recent Labs Lab 11/25/13 2145 11/26/13 0945  WBC 6.8 6.7  HGB 10.4* 11.2*  HCT 32.7* 35.2*  MCV 94.2 95.1  PLT 198 213   Cardiac Enzymes:  Recent Labs Lab 11/25/13 2145 11/26/13 0050 11/26/13 0512  TROPONINI <0.30 <0.30 <0.30   BNP: BNP (last 3 results)  Recent Labs  10/11/13 0500 11/25/13 2145  PROBNP 2172.0* 2407.0*   CBG: No results found for this basename: GLUCAP,  in the last 168 hours     Signed:  Eliseo Squires, Aya Geisel  [facility] 11/26/2013, 11:07 AM

## 2013-11-26 NOTE — Progress Notes (Signed)
Returned to patient's room to re-assess pain. She is asleep.

## 2013-11-26 NOTE — Progress Notes (Signed)
Pt complained of pain in her back between her shoulders. She rates the pain a "4" on a 0-10 pain scale. She reports it is similar to the pain she experienced when she had a MI. She states she was told in the ED the pain was related to a muscle in her back. EKG completed and compared to EKG done last night in ED. No changes observed. Pt's troponin is WNL x 2. SL NTG given x 1. Reports some relief then states the pain was not relieved. She states then states the pain is not radiating between her shoulders like it did when she had an MI. She reports the pain is located near the left shoulder blade and is the same pain that caused her to come to the ER last night. Tylenol given. BP with pain episode was 117/53, HR 60. BP was 111/58 post NTG. Told to call as needed.

## 2013-11-26 NOTE — ED Notes (Signed)
Admitting MD at BS.  

## 2013-11-26 NOTE — Consult Note (Signed)
CARDIOLOGY CONSULT NOTE       Patient ID: JAY KEMPE MRN: [id number] DOB/AGE: [birth year] 78 y.o.  Admit date: 11/25/2013 Referring Physician:  Eliseo Squires Primary Physician: Henrine Screws, MD Primary Cardiologist:  Ellyn Hack Reason for Consultation:  Chest Pain  Principal Problem:   Chest pain Active Problems:   HTN (hypertension)   Cardiomyopathy, ischemic- 30-35% on admission 10/09/13   Dyslipidemia- LDL 112   PAF- peri-MI. NSR on Amio at discharge   CAD (coronary artery disease)   HPI:   ALEATHA TAITE is a 80 y.o. female with an history of CAD S/P PTI with Stent placements 09/2013 after a NSTEMI who presents to the ED with complaints of 7/10 SSCP and SOB that started at noon today. The pain had a gradual onset and was associated with SOB and nausea. EMS was called and she was given 2 NTG and an Aspirin and the pain was relieved. She was taken to the [facility] ED for further evalauation. In the ED, she was found to have EKG changes that were not seen on her last EKG, however her troponin level was at 0.03. She was referred for medical admission. Her Cardiologist is Dr. Ellyn Hack. She is currently undergoing cardiac Rehab since her NSTEMI, and is living at the [facility].   10/09/13 had complex intervention to LAD complicated by disection and hematoma  2 overlapping DES.  Subsequently admitted for postural hypotension with overdiuresis on lasix.  Currently she feels fine No chest pain.  Pain initially more in back and has muscle strain  Her lightheadedness has resolved since last admission.  No dyspnea now and had large diuresis with one dose lasix in ER  CXR with no CHF But BNP elevated about 2407  Back to independent living and has already ambulted 3 times this am with no dyspnea or chest pain.   ROS All other systems reviewed and negative except as noted above  Past Medical History  Diagnosis Date  . Hypertension   . Migraine headache   . Thyroid disease   .  Depression   . Acute respiratory failure due to hypoxia 10/10/2013    secondary to anterior STEMI related pulmonary edema (resolved)  . ST elevation (STEMI) myocardial infarction involving left anterior descending coronary artery 10/09/13    Mid LAD occulsion s/p PCI (extensive disection)  . CAD S/P percutaneous coronary angioplasty 10/09/13    DES x 2 to Mid LAD, 2.5 x 38, 2.5 x 12 Promus (covering extensive disection), moderate RCA disesase  . Ischemic cardiomyopathy 10/16/13    Repeat Echo EF 40-45% with distal LAD hypokenesis improved from initial, post STEMI    Family History  Problem Relation Age of Onset  . Varicose Veins Father     History   Social History  . Marital Status: Married    Spouse Name: N/A    Number of Children: N/A  . Years of Education: N/A   Occupational History  . Not on file.   Social History Main Topics  . Smoking status: Former Research scientist (life sciences)  . Smokeless tobacco: Not on file  . Alcohol Use: No  . Drug Use: No  . Sexual Activity: Not on file   Other Topics Concern  . Not on file   Social History Narrative   Patient is Married, since 1958 [name])   Lives in apartment, Independent Living section at [place] since 04/2013.   Stopped Smoking many years ago, minimal aalcohol history.    Regular exercise includes swimming  3d/week,   Patient has no Advanced planning documents                   Past Surgical History  Procedure Laterality Date  . Shoulder surgery    . Coronary angioplasty with stent placement  10/09/13    2 overlapping Promus DES to mid LAD, occlusion/dissection (2.5 x 38, 2.5 x 12)     . amiodarone  200 mg Oral Daily  . aspirin EC  81 mg Oral Daily  . atorvastatin  40 mg Oral q1800  . calcium-vitamin D  2 tablet Oral Daily  . carvedilol  6.25 mg Oral BID WC  . cholecalciferol  1,000 Units Oral Daily  . enoxaparin (LOVENOX) injection  40 mg Subcutaneous Q24H  . levothyroxine  100 mcg Oral QAC breakfast  .  loratadine  10 mg Oral Daily  . losartan  50 mg Oral Daily  . multivitamin with minerals  1 tablet Oral Daily  . pantoprazole  40 mg Oral Daily  . sertraline  50 mg Oral Daily  . sodium chloride  3 mL Intravenous Q12H  . sodium chloride  3 mL Intravenous Q12H  . Ticagrelor  90 mg Oral BID  . vitamin E  400 Units Oral Daily      Physical Exam: Blood pressure 118/39, pulse 59, temperature 97.7 F (36.5 C), temperature source Oral, resp. rate 15, height 5\' 6"  (1.676 m), weight 195 lb 9.6 oz (88.724 kg), SpO2 98.00%.   Affect appropriate Obese white female HEENT: normal Neck supple with no adenopathy JVP normal no bruits no thyromegaly Lungs clear with no wheezing and good diaphragmatic motion Heart:  S1/S2 SEM  murmur, no rub, gallop or click PMI normal Abdomen: benighn, BS positve, no tenderness, no AAA no bruit.  No HSM or HJR Distal pulses intact with no bruits No edema Neuro non-focal Skin warm and dry No muscular weakness   Labs:   Lab Results  Component Value Date   WBC 6.7 11/26/2013   HGB 11.2* 11/26/2013   HCT 35.2* 11/26/2013   MCV 95.1 11/26/2013   PLT 213 11/26/2013    Recent Labs Lab 11/25/13 2145  NA 138  K 4.0  CL 105  CO2 20  BUN 17  CREATININE 0.88  CALCIUM 9.5  PROT 6.5  BILITOT 0.3  ALKPHOS 111  ALT 39*  AST 35  GLUCOSE 100*   Lab Results  Component Value Date   CKTOTAL 1464* 10/10/2013   CKMB 178.2* 10/10/2013   TROPONINI <0.30 11/26/2013    Lab Results  Component Value Date   CHOL 98 10/23/2013   CHOL 173 10/10/2013   Lab Results  Component Value Date   HDL 33* 10/23/2013   HDL 46 10/10/2013   Lab Results  Component Value Date   LDLCALC 46 10/23/2013   LDLCALC 112* 10/10/2013   Lab Results  Component Value Date   TRIG 130 10/23/2013   TRIG 73 10/10/2013   Lab Results  Component Value Date   CHOLHDL 3.8 10/10/2013   No results found for this basename: LDLDIRECT      Radiology: Dg Chest Portable 1 View  11/25/2013   CLINICAL  DATA:  Chest pain and shortness of breath.  EXAM: PORTABLE CHEST - 1 VIEW  COMPARISON:  10/11/2013  FINDINGS: Endotracheal and enteric tubes have been removed. The cardiac silhouette remains enlarged. Thoracic aorta is tortuous. There are patchy perihilar and bibasilar opacities bilaterally. A small left pleural effusion is questioned. There is  no evidence of pneumothorax. No acute osseous abnormality is identified.  IMPRESSION: New perihilar and bibasilar opacities, concerning for edema. Possible small left pleural effusion.   Electronically Signed   By: Logan Bores   On: 11/25/2013 21:58    EKG:  SR old anteroalteral MI with loss of R waves and T wave inversions No change from 10/24/13   ASSESSMENT AND PLAN:  Chest/Back Pain:  Pain atypical  Resolved with palpable pain in trapezium muscle this am.  R/O and ECG no change form 1/6  Continue current meds Ok to d/c Already has f/u with Dr Ellyn Hack Continue DAT for a year CHF:  Lungs clear good diuresis EF 40-45% by recent echo  Continue beta blocker and losartan  No regular diuretic given recent history of dehydration and postural hypotension Chol:  Near goal continue statin f/u labs in 3 months   Signed: Jenkins Rouge 11/26/2013, 10:49 AM

## 2013-11-26 NOTE — ED Notes (Signed)
Pt agreed to take the lasix.

## 2013-11-27 ENCOUNTER — Telehealth (HOSPITAL_COMMUNITY): Payer: Self-pay | Admitting: *Deleted

## 2013-11-27 ENCOUNTER — Encounter (HOSPITAL_COMMUNITY)
Admission: RE | Admit: 2013-11-27 | Discharge: 2013-11-27 | Disposition: A | Discharge status: Home / Self Care | Payer: Medicare Other | Source: Ambulatory Visit | Attending: Cardiology | Admitting: Cardiology

## 2013-11-27 NOTE — Telephone Encounter (Signed)
Message left on answering machine.  Ok to return to exercise.

## 2013-11-27 NOTE — Telephone Encounter (Signed)
Message copied by Rowe Pavy on Mon Nov 27, 2013  2:21 PM ------      Message from: Ellyn Hack, DAVID W      Created: Mon Nov 27, 2013 12:53 PM      Regarding: RE: Melinda Noble to return to rehab       Yes.            Leonie Man, MD            ----- Message -----         From: Rowe Pavy, RN         Sent: 11/27/2013  12:20 PM           To: Leonie Man, MD      Subject: Melinda Noble to return to rehab                                    Pt  S/p 12/22 stemi and 12/23 stent was  recently hospitalized for chest pain 2/7-2/8.  Notes in epic refer to discomfort as muscular in nature and non cardiac.        May pt return to exercise?            Thanks [employer] RN       ------

## 2013-11-29 ENCOUNTER — Encounter (HOSPITAL_COMMUNITY)
Admission: RE | Admit: 2013-11-29 | Discharge: 2013-11-29 | Disposition: A | Discharge status: Home / Self Care | Payer: Medicare Other | Source: Ambulatory Visit | Attending: Cardiology | Admitting: Cardiology

## 2013-11-29 DIAGNOSIS — Z5189 Encounter for other specified aftercare: Secondary | ICD-10-CM | POA: Diagnosis not present

## 2013-11-29 DIAGNOSIS — I252 Old myocardial infarction: Secondary | ICD-10-CM | POA: Diagnosis not present

## 2013-12-01 ENCOUNTER — Encounter (HOSPITAL_COMMUNITY)
Admission: RE | Admit: 2013-12-01 | Discharge: 2013-12-01 | Disposition: A | Discharge status: Home / Self Care | Payer: Medicare Other | Source: Ambulatory Visit | Attending: Cardiology | Admitting: Cardiology

## 2013-12-01 DIAGNOSIS — I252 Old myocardial infarction: Secondary | ICD-10-CM | POA: Diagnosis not present

## 2013-12-01 DIAGNOSIS — Z5189 Encounter for other specified aftercare: Secondary | ICD-10-CM | POA: Diagnosis not present

## 2013-12-04 ENCOUNTER — Encounter (HOSPITAL_COMMUNITY)
Admission: RE | Admit: 2013-12-04 | Discharge: 2013-12-04 | Disposition: A | Discharge status: Home / Self Care | Payer: Medicare Other | Source: Ambulatory Visit | Attending: Cardiology | Admitting: Cardiology

## 2013-12-04 DIAGNOSIS — I252 Old myocardial infarction: Secondary | ICD-10-CM | POA: Diagnosis not present

## 2013-12-04 DIAGNOSIS — Z5189 Encounter for other specified aftercare: Secondary | ICD-10-CM | POA: Diagnosis not present

## 2013-12-06 ENCOUNTER — Telehealth: Payer: Self-pay | Admitting: Cardiology

## 2013-12-06 ENCOUNTER — Encounter (HOSPITAL_COMMUNITY)
Admission: RE | Admit: 2013-12-06 | Discharge: 2013-12-06 | Disposition: A | Discharge status: Home / Self Care | Payer: Medicare Other | Source: Ambulatory Visit | Attending: Cardiology | Admitting: Cardiology

## 2013-12-06 DIAGNOSIS — Z5189 Encounter for other specified aftercare: Secondary | ICD-10-CM | POA: Diagnosis not present

## 2013-12-06 DIAGNOSIS — I252 Old myocardial infarction: Secondary | ICD-10-CM | POA: Diagnosis not present

## 2013-12-06 NOTE — Telephone Encounter (Signed)
Message forwarded to L. Dorene Ar, NP for further instructions as Dr. Ellyn Hack is out of the office today.

## 2013-12-06 NOTE — Telephone Encounter (Signed)
What type of dental work?  With stent greater chance of bleeding for anything other than cleaning.  But no need for antibiotics with stents.

## 2013-12-06 NOTE — Telephone Encounter (Signed)
Returned call pt verified x 2 w/ husband. Unsure of what type of dental work needed b/c pt c/o pain in tooth.  Informed husband per instructions by NP that Abx not needed and pt at increased risk for bleeding.  Advised dentist office call tomorrow when pt goes in if consult needed.  Verbalized understanding and agreed w/ plan.

## 2013-12-06 NOTE — Telephone Encounter (Signed)
See previous note

## 2013-12-06 NOTE — Telephone Encounter (Signed)
Pt have stent and she is scheduled for dental work tomorrow. Should she have medicine before dental work?

## 2013-12-08 ENCOUNTER — Encounter (HOSPITAL_COMMUNITY)
Admission: RE | Admit: 2013-12-08 | Discharge: 2013-12-08 | Disposition: A | Discharge status: Home / Self Care | Payer: Medicare Other | Source: Ambulatory Visit | Attending: Cardiology | Admitting: Cardiology

## 2013-12-08 DIAGNOSIS — I252 Old myocardial infarction: Secondary | ICD-10-CM | POA: Diagnosis not present

## 2013-12-08 DIAGNOSIS — Z5189 Encounter for other specified aftercare: Secondary | ICD-10-CM | POA: Diagnosis not present

## 2013-12-11 ENCOUNTER — Encounter (HOSPITAL_COMMUNITY)
Admission: RE | Admit: 2013-12-11 | Discharge: 2013-12-11 | Disposition: A | Discharge status: Home / Self Care | Payer: Medicare Other | Source: Ambulatory Visit | Attending: Cardiology | Admitting: Cardiology

## 2013-12-11 DIAGNOSIS — I252 Old myocardial infarction: Secondary | ICD-10-CM | POA: Diagnosis not present

## 2013-12-11 DIAGNOSIS — Z5189 Encounter for other specified aftercare: Secondary | ICD-10-CM | POA: Diagnosis not present

## 2013-12-11 NOTE — Progress Notes (Signed)
Melinda Noble 80 y.o. female Nutrition Note Spoke with pt. Pt lives at [facility]. Nutrition Plan and Nutrition Survey goals reviewed with pt. Pt is following Step 2 of the Therapeutic Lifestyle Changes diet. Age-appropriate dietary recommendations reviewed. Pt wants to lose wt. Pt has been trying to lose wt by "decreasing portion sizes eaten." Pt states wt today 90.6 kg, which is down 3 kg since admission. Pt feels some wt loss likely due to decreased appetite since her heart event. Pt reports her appetite has started improving "just this weekend." Wt loss tips reviewed. Pt is pre-diabetic according to her most recent A1c. Pre-diabetes discussed. Pt expressed understanding of the information reviewed. Pt aware of nutrition education classes offered.  Nutrition Diagnosis   Food-and nutrition-related knowledge deficit related to lack of exposure to information as related to diagnosis of: ? CVD ? Pre-DM (A1c 5.8) ?   Obesity related to excessive energy intake as evidenced by a BMI of 33.6  Nutrition RX/ Estimated Daily Nutrition Needs for: wt loss  1200-1600 Kcal, 30-45 gm fat, 7-13 gm sat fat, 1.1-1.6 gm trans-fat, <1500 mg sodium  Nutrition Intervention   Pt's individual nutrition plan reviewed with pt.   Benefits of adopting Therapeutic Lifestyle Changes discussed when Medficts reviewed.   Pt to attend the Portion Distortion class   Pt to attend the  ? Nutrition I class                     ? Nutrition II class   Continue client-centered nutrition education by RD, as part of interdisciplinary care. Goal(s)   Pt to identify food quantities necessary to achieve: ? wt loss to a goal wt of 182-200 lb (82.7-90.9 kg) at graduation from cardiac rehab.    Pt to describe the benefit of including fruits, vegetables, whole grains, and low-fat dairy products in a heart healthy meal plan. Monitor and Evaluate progress toward nutrition goal with team. Nutrition Risk: Change to Moderate Melinda Noble, M.Ed, RD,  LDN, CDE 12/11/2013 11:53 AM

## 2013-12-12 ENCOUNTER — Ambulatory Visit (INDEPENDENT_AMBULATORY_CARE_PROVIDER_SITE_OTHER): Payer: Medicare Other | Admitting: Cardiology

## 2013-12-12 ENCOUNTER — Encounter: Payer: Self-pay | Admitting: Cardiology

## 2013-12-12 ENCOUNTER — Ambulatory Visit: Payer: Medicare Other | Admitting: Cardiology

## 2013-12-12 VITALS — BP 146/84 | HR 59 | Ht 66.5 in | Wt 198.5 lb

## 2013-12-12 DIAGNOSIS — E785 Hyperlipidemia, unspecified: Secondary | ICD-10-CM

## 2013-12-12 DIAGNOSIS — I2510 Atherosclerotic heart disease of native coronary artery without angina pectoris: Secondary | ICD-10-CM

## 2013-12-12 DIAGNOSIS — I480 Paroxysmal atrial fibrillation: Secondary | ICD-10-CM

## 2013-12-12 DIAGNOSIS — Z9861 Coronary angioplasty status: Secondary | ICD-10-CM | POA: Diagnosis not present

## 2013-12-12 DIAGNOSIS — E669 Obesity, unspecified: Secondary | ICD-10-CM

## 2013-12-12 DIAGNOSIS — Z79899 Other long term (current) drug therapy: Secondary | ICD-10-CM | POA: Diagnosis not present

## 2013-12-12 DIAGNOSIS — I4891 Unspecified atrial fibrillation: Secondary | ICD-10-CM

## 2013-12-12 DIAGNOSIS — I2589 Other forms of chronic ischemic heart disease: Secondary | ICD-10-CM

## 2013-12-12 DIAGNOSIS — I255 Ischemic cardiomyopathy: Secondary | ICD-10-CM

## 2013-12-12 DIAGNOSIS — I213 ST elevation (STEMI) myocardial infarction of unspecified site: Secondary | ICD-10-CM

## 2013-12-12 DIAGNOSIS — I219 Acute myocardial infarction, unspecified: Secondary | ICD-10-CM

## 2013-12-12 DIAGNOSIS — I10 Essential (primary) hypertension: Secondary | ICD-10-CM

## 2013-12-12 DIAGNOSIS — I951 Orthostatic hypotension: Secondary | ICD-10-CM

## 2013-12-12 NOTE — Patient Instructions (Signed)
Dr. Ellyn Hack has recommended that you have a pulmonary function test. Pulmonary Function Tests are a group of tests that measure how well air moves in and out of your lungs.  Your physician recommends that you schedule a follow-up appointment in: 3 months.

## 2013-12-13 ENCOUNTER — Encounter: Payer: Self-pay | Admitting: Cardiology

## 2013-12-13 ENCOUNTER — Encounter (HOSPITAL_COMMUNITY)
Admission: RE | Admit: 2013-12-13 | Discharge: 2013-12-13 | Disposition: A | Discharge status: Home / Self Care | Payer: Medicare Other | Source: Ambulatory Visit | Attending: Cardiology | Admitting: Cardiology

## 2013-12-13 DIAGNOSIS — Z5189 Encounter for other specified aftercare: Secondary | ICD-10-CM | POA: Diagnosis not present

## 2013-12-13 DIAGNOSIS — I252 Old myocardial infarction: Secondary | ICD-10-CM | POA: Diagnosis not present

## 2013-12-13 NOTE — Assessment & Plan Note (Signed)
Remains in NSR - for now, I plan to continue Amiodarone to keep her in NSR as she did not tolerate Afib post MI & is not on BB.  Would prefer to avoid need for anticoagulation on top of DAPT -- another reason to continue Amiodarone. Amiodarone monitoring: Will have LFTs checked with lipids. Is on Synthroid for hypothyroid, so TSH is being monitored (hyperthyroid response is therefore unlikely, but may need up-titration of Synthroid if hypothyroid response); Needs baseline PFTs with annual follow-up.  Plan: Continue Amiodarone. Check PFTs.  Defer LFT/TSH monitoring to PCP who is following lipids & thyroid funciton.

## 2013-12-13 NOTE — Assessment & Plan Note (Signed)
Currently doing well -- on ASA & Brilinta with no bleeding concerns.   Statin on "hold" due to concern for possible rash from atorvastatin -- will defer decision on restarting vs changing to a different statin to PCP. On ARB, but not on BB --> had been on Carvedilol (previously on Atenolol), but now listed as Allergy. Is getting some BB benefit from Amiodarone.

## 2013-12-13 NOTE — Assessment & Plan Note (Addendum)
Current lipids look great.  Would like to have pleiotropic effect of statin, but will defer to PCP re: plans since her ? Allergy/rash with atorvastatin.

## 2013-12-13 NOTE — Assessment & Plan Note (Signed)
Currently well compensated - NYHA HF Class I. Using minimal PRN Lasix - it drains her & makes her feel "worn out" all day if she takes it.  Her husband assures me that they are following the Sliding Scale Lasix plan as discussed prior to d/c -- is adjusting dry wgt based upon dietary wgt loss. On ARB, but no BB (other than Amiodarone) as noted above.

## 2013-12-13 NOTE — Assessment & Plan Note (Signed)
Currently doing well with weight loss -- diet & exercise.  Excited about being able to go back to swim.

## 2013-12-13 NOTE — Progress Notes (Signed)
PATIENT: Melinda Noble MRN: [id number]  DOB: 1929-11-01   DOV:12/13/2013 PCP: Henrine Screws, MD  Clinic Note: Chief Complaint  Patient presents with  . Follow-up    6 week:  No chest pain, SOB,edema or dizziness.     HPI: Melinda Noble is a 80 y.o.  female with a PMH below who presents today for 2nd post-discharge follow-up from a significant Anterior STEMI in late December 2014.  She was discharged on Amiodarone due to initially unstable / symptomatic Afib post MI. She has been to the ER twice since her discharge -- once on 1/6 for dehydration and orthostatic hypotension - her Lasix was converted to PRN & her BB was converted to Carvedilol from Atenolol.  She was then readmitted on 2/6 for SSCP and r/o for MI -- pain thought to be MSK related.  Her proBNP was elevated, so she was treated with IV lasix x 1 that notably improved her symptoms.  At that time, her Carvedilol was not continued on D/C -- now has BB listed a allergy.  ACE-I was converted to ARB for cough. She subsequently developed a rash after converting from 75m to 420mof Atorvastatin.   Interval History:  Thankfully, she has done well since her recent overnight hospital stay with no further anginal CP at rest or with exertion.  She has not taken any Lasix since her hospital visit & denies any PND/orthopnea or edema.  She is enjoying Cardiac Rehab & was happy to be out of the "inpatient rehab" section of [name].  She is also looking forward to being able to get back into her swimming routine.  Cardiovascular ROS: no chest pain or dyspnea on exertion positive for - occasional positional dizziness -- but for her no more than her pre-morbid dizziness.  Occasional palpitations. negative for - edema, irregular heartbeat, loss of consciousness, paroxysmal nocturnal dyspnea, rapid heart rate, shortness of breath or syncope/near-syncope; TIA/Amaurosis fugax symptoms.  No Melena, hematochezia or heamturia.: No melena.  Past Medical  History  Diagnosis Date  . Hypertension   . Migraine headache   . Thyroid disease   . Depression   . H/O Acute respiratory failure due to hypoxia 10/10/2013    secondary to anterior STEMI related pulmonary edema (resolved)  . ST elevation (STEMI) myocardial infarction involving left anterior descending coronary artery 10/09/13    Mid LAD occulsion s/p PCI (extensive disection)  . CAD S/P percutaneous coronary angioplasty 10/09/13    DES x 2 to Mid LAD, 2.5 x 38, 2.5 x 12 Promus (covering extensive disection), moderate RCA disesase  . Ischemic cardiomyopathy 10/16/13    Repeat Echo EF 40-45% with distal LAD hypokenesis improved from initial, post STEMI    Prior Cardiac Evaluation and Past Surgical History: Past Surgical History  Procedure Laterality Date  . Shoulder surgery    . Coronary angioplasty with stent placement  10/09/13    2 overlapping Promus DES to mid LAD, occlusion/dissection (2.5 x 38, 2.5 x 12)    Allergies  Allergen Reactions  . Ace Inhibitors Anaphylaxis  . Beta Adrenergic Blockers Anaphylaxis  . Iohexol Anaphylaxis     Code: HIVES, Desc: anaphylactic shock s/p contrast injection many yrs ago--suggested that pt NEVER have iv contrast//a.c., Onset Date: [id number] . Atorvastatin Rash  . Latex Rash  . Penicillins Swelling and Rash  . Sulfa Antibiotics Rash    Current Outpatient Prescriptions  Medication Sig Dispense Refill  . acetaminophen (TYLENOL) 500 MG tablet Take 500-1,000 mg by  mouth every 6 (six) hours as needed for mild pain.      Marland Kitchen ALPRAZolam (XANAX) 0.25 MG tablet Take 0.25 mg by mouth 3 (three) times daily as needed for anxiety.      Marland Kitchen amiodarone (PACERONE) 200 MG tablet Take 1 tablet (200 mg total) by mouth daily.  30 tablet  0  . aspirin 81 MG chewable tablet Chew 1 tablet (81 mg total) by mouth daily.      . Biotin 1000 MCG tablet Take 1,000 mcg by mouth daily.      Marland Kitchen buPROPion (WELLBUTRIN XL) 150 MG 24 hr tablet Take 150 mg by mouth daily.       . calcium-vitamin D (OSCAL WITH D) 500-200 MG-UNIT per tablet Take 2 tablets by mouth daily.      . cholecalciferol (VITAMIN D) 1000 UNITS tablet Take 1,000 Units by mouth daily.      . fexofenadine (ALLEGRA) 60 MG tablet Take 60 mg by mouth 2 (two) times daily as needed for allergies or rhinitis.      . furosemide (LASIX) 20 MG tablet Take 1 tablet (20 mg total) by mouth daily as needed (Weight gain of 5 lbs in a week, or 3 lbs in a day.).  30 tablet  5  . levothyroxine (SYNTHROID, LEVOTHROID) 100 MCG tablet Take 100 mcg by mouth daily before breakfast.      . losartan (COZAAR) 50 MG tablet Take 1 tablet (50 mg total) by mouth daily.  30 tablet  5  . Multiple Vitamins-Minerals (MULTIVITAMIN WITH MINERALS) tablet Take 1 tablet by mouth daily.      . nitroGLYCERIN (NITROSTAT) 0.4 MG SL tablet Place 1 tablet (0.4 mg total) under the tongue every 5 (five) minutes as needed for chest pain.  25 tablet  6  . omeprazole (PRILOSEC) 20 MG capsule Take 20 mg by mouth daily as needed.      [name] Glycol-Propyl Glycol (SYSTANE OP) Place 1-2 drops into both eyes 3 (three) times daily as needed (for dry eye or irritation).      . sertraline (ZOLOFT) 50 MG tablet Take 50 mg by mouth daily.      . Ticagrelor (BRILINTA) 90 MG TABS tablet Take 1 tablet (90 mg total) by mouth 2 (two) times daily.  60 tablet  5  . vitamin E (VITAMIN E) 400 UNIT capsule Take 400 Units by mouth daily.       No current facility-administered medications for this visit.   Facility-Administered Medications Ordered in Other Visits  Medication Dose Route Frequency Provider Last Rate Last Dose  . etomidate (AMIDATE) injection    Anesthesia Intra-op Fulton Reek, MD   14 mg at 10/10/13 0105  . rocuronium [facility]) injection    Anesthesia Intra-op Fulton Reek, MD   50 mg at 10/10/13 0110  . succinylcholine (ANECTINE) injection    Anesthesia Intra-op Fulton Reek, MD   100 mg at 10/10/13 0105    History   Social  History Narrative   Patient is Married, since 1958 [name])   Lives in apartment, Independent Living section at [facility] since 04/2013.   Stopped Smoking many years ago, minimal aalcohol history.   Regular exercise includes swimming 3d/week,   Patient has no Advanced planning documents    ROS: A comprehensive Review of Systems - Negative except standard MSK/arthritis pains.  Intermittent coughing spells in the evenings.  PHYSICAL EXAM BP 146/84  Pulse 59  Ht 5' 6.5" (1.689 m)  Wt 198 lb  8 oz (90.039 kg)  BMI 31.56 kg/m2 Filed Weights   12/12/13 0735  Weight: 198 lb 8 oz (90.039 kg)   General appearance: alert, cooperative, appears stated age, no distress, mildly obese and well groomed/well nourished.  A bit slow & deliberate asking/answering questions - often defers to her husband. Neck: no adenopathy, no carotid bruit, no JVD and supple, symmetrical, trachea midline Lungs: clear to auscultation bilaterally, normal percussion bilaterally and non-labored, good air movement. Heart: regular rate and rhythm, S1, S2 normal, no murmur, click, rub or gallop and normal apical impulse Abdomen: soft, non-tender; bowel sounds normal; no masses,  no organomegaly Extremities: extremities normal, atraumatic, no cyanosis or edema and no edema, redness or tenderness in the calves or thighs Pulses: 2+ and symmetric Neurologic: Grossly normal  [id number] today: Yes Rate:59 , Rhythm: Glade Stanford;  Anterior (anteroseptal) MI - age undetermined, LAD   Recent Labs: Lipids 10/23/13 - TC 38, TG 130, HDL 33, LDL 46   ASSESSMENT / PLAN: CAD S/P percutaneous coronary angioplasty -- PCI to LAD in setting Ant STEMI; 2 overlapping Promx DES 2.5 x 38, 2.5 x 12 (postdilated to ~3 mm) Currently doing well -- on ASA & Brilinta with no bleeding concerns.   Statin on "hold" due to concern for possible rash from atorvastatin -- will defer decision on restarting vs changing to a different statin to  PCP. On ARB, but not on BB --> had been on Carvedilol (previously on Atenolol), but now listed as Allergy. Is getting some BB benefit from Amiodarone.  Cardiomyopathy, ischemic- 30-35% on admission 10/09/13 --> increased to 40-45% prior to discharge. Currently well compensated - NYHA HF Class I. Using minimal PRN Lasix - it drains her & makes her feel "worn out" all day if she takes it.  Her husband assures me that they are following the Sliding Scale Lasix plan as discussed prior to d/c -- is adjusting dry wgt based upon dietary wgt loss. On ARB, but no BB (other than Amiodarone) as noted above.  PAF- peri-MI. NSR on Amio at discharge Remains in NSR - for now, I plan to continue Amiodarone to keep her in NSR as she did not tolerate Afib post MI & is not on BB.  Would prefer to avoid need for anticoagulation on top of DAPT -- another reason to continue Amiodarone. Amiodarone monitoring: Will have LFTs checked with lipids. Is on Synthroid for hypothyroid, so TSH is being monitored (hyperthyroid response is therefore unlikely, but may need up-titration of Synthroid if hypothyroid response); Needs baseline PFTs with annual follow-up.  Plan: Continue Amiodarone. Check PFTs.  Defer LFT/TSH monitoring to PCP who is following lipids & thyroid funciton.  Dyslipidemia- LDL 112 Current lipids look great.  Would like to have pleiotropic effect of statin, but will defer to PCP re: plans since her ? Allergy/rash with atorvastatin.  Obesity (BMI 30.0-34.9) Currently doing well with weight loss -- diet & exercise.  Excited about being able to go back to swim.   Orders Placed This Encounter  Procedures  . PFT W/ BRONCH (MET-TEST)    Standing Status: Future     Number of Occurrences:      Standing Expiration Date: 12/12/2014    Scheduling Instructions:     with [facility]  . EKG 12-Lead   No orders of the defined types were placed in this encounter.    Followup: 3 months  Cheetara Hoge W. Ellyn Hack, M.D.,  M.S. Interventional Cardiology [facility]

## 2013-12-15 ENCOUNTER — Encounter (HOSPITAL_COMMUNITY)
Admission: RE | Admit: 2013-12-15 | Discharge: 2013-12-15 | Disposition: A | Discharge status: Home / Self Care | Payer: Medicare Other | Source: Ambulatory Visit | Attending: Cardiology | Admitting: Cardiology

## 2013-12-15 DIAGNOSIS — I252 Old myocardial infarction: Secondary | ICD-10-CM | POA: Diagnosis not present

## 2013-12-15 DIAGNOSIS — Z5189 Encounter for other specified aftercare: Secondary | ICD-10-CM | POA: Diagnosis not present

## 2013-12-18 ENCOUNTER — Encounter (INDEPENDENT_AMBULATORY_CARE_PROVIDER_SITE_OTHER): Payer: Medicare Other

## 2013-12-18 ENCOUNTER — Encounter (HOSPITAL_COMMUNITY)
Admission: RE | Admit: 2013-12-18 | Discharge: 2013-12-18 | Disposition: A | Discharge status: Home / Self Care | Payer: Medicare Other | Source: Ambulatory Visit | Attending: Cardiology | Admitting: Cardiology

## 2013-12-18 DIAGNOSIS — Z5189 Encounter for other specified aftercare: Secondary | ICD-10-CM | POA: Diagnosis not present

## 2013-12-18 DIAGNOSIS — R0609 Other forms of dyspnea: Secondary | ICD-10-CM

## 2013-12-18 DIAGNOSIS — R0989 Other specified symptoms and signs involving the circulatory and respiratory systems: Secondary | ICD-10-CM

## 2013-12-18 DIAGNOSIS — I252 Old myocardial infarction: Secondary | ICD-10-CM | POA: Insufficient documentation

## 2013-12-18 DIAGNOSIS — Z79899 Other long term (current) drug therapy: Secondary | ICD-10-CM

## 2013-12-19 ENCOUNTER — Encounter: Payer: Self-pay | Admitting: Cardiology

## 2013-12-19 DIAGNOSIS — G4701 Insomnia due to medical condition: Secondary | ICD-10-CM | POA: Diagnosis not present

## 2013-12-19 DIAGNOSIS — I2510 Atherosclerotic heart disease of native coronary artery without angina pectoris: Secondary | ICD-10-CM | POA: Diagnosis not present

## 2013-12-19 DIAGNOSIS — F3289 Other specified depressive episodes: Secondary | ICD-10-CM | POA: Diagnosis not present

## 2013-12-19 DIAGNOSIS — F329 Major depressive disorder, single episode, unspecified: Secondary | ICD-10-CM | POA: Diagnosis not present

## 2013-12-19 DIAGNOSIS — L568 Other specified acute skin changes due to ultraviolet radiation: Secondary | ICD-10-CM | POA: Diagnosis not present

## 2013-12-19 DIAGNOSIS — E7800 Pure hypercholesterolemia, unspecified: Secondary | ICD-10-CM | POA: Diagnosis not present

## 2013-12-20 ENCOUNTER — Encounter (HOSPITAL_COMMUNITY)
Admission: RE | Admit: 2013-12-20 | Discharge: 2013-12-20 | Disposition: A | Discharge status: Home / Self Care | Payer: Medicare Other | Source: Ambulatory Visit | Attending: Cardiology | Admitting: Cardiology

## 2013-12-20 DIAGNOSIS — I252 Old myocardial infarction: Secondary | ICD-10-CM | POA: Diagnosis not present

## 2013-12-20 DIAGNOSIS — Z5189 Encounter for other specified aftercare: Secondary | ICD-10-CM | POA: Diagnosis not present

## 2013-12-22 ENCOUNTER — Encounter (HOSPITAL_COMMUNITY)
Admission: RE | Admit: 2013-12-22 | Discharge: 2013-12-22 | Disposition: A | Discharge status: Home / Self Care | Payer: Medicare Other | Source: Ambulatory Visit | Attending: Cardiology | Admitting: Cardiology

## 2013-12-22 DIAGNOSIS — I252 Old myocardial infarction: Secondary | ICD-10-CM | POA: Diagnosis not present

## 2013-12-22 DIAGNOSIS — Z5189 Encounter for other specified aftercare: Secondary | ICD-10-CM | POA: Diagnosis not present

## 2013-12-25 ENCOUNTER — Encounter (HOSPITAL_COMMUNITY)
Admission: RE | Admit: 2013-12-25 | Discharge: 2013-12-25 | Disposition: A | Discharge status: Home / Self Care | Payer: Medicare Other | Source: Ambulatory Visit | Attending: Cardiology | Admitting: Cardiology

## 2013-12-25 ENCOUNTER — Telehealth: Payer: Self-pay | Admitting: Internal Medicine

## 2013-12-25 DIAGNOSIS — Z5189 Encounter for other specified aftercare: Secondary | ICD-10-CM | POA: Diagnosis not present

## 2013-12-25 DIAGNOSIS — I252 Old myocardial infarction: Secondary | ICD-10-CM | POA: Diagnosis not present

## 2013-12-25 NOTE — Telephone Encounter (Signed)
OK.   I think Dr. Tamala Julian takes care of a family member.  Melinda Noble

## 2013-12-25 NOTE — Telephone Encounter (Signed)
She wanted you to know pt have decided to change from Dr Debara Pickett to Dr Daneen Schick.

## 2013-12-25 NOTE — Telephone Encounter (Signed)
Pt was seen by Dr. Ellyn Hack, not Dr. Debara Pickett.  Will forward to Dr. Sharlyn Bologna, RN.

## 2013-12-25 NOTE — Progress Notes (Signed)
On Wednesday March 4, pt complained that someone else was on her equipment.  We explained to the pt that the class is growing and that we would try to accommodate by changing her order for Friday.  On Friday, we switched her equipment order to open up the bike up front with the class.  When she arrived, her blood pressure was elevated due to the stress that this was causing her.  She liked her new equipment order and seemed to leave feeling like the class had gone better for her.  Conferred with RNs,  who had also spoken with pt, that Friday had been less stressful for patient. Alberteen Sam, MA, ACSM RCEP

## 2013-12-27 ENCOUNTER — Encounter (HOSPITAL_COMMUNITY)
Admission: RE | Admit: 2013-12-27 | Discharge: 2013-12-27 | Disposition: A | Discharge status: Home / Self Care | Payer: Medicare Other | Source: Ambulatory Visit | Attending: Cardiology | Admitting: Cardiology

## 2013-12-27 DIAGNOSIS — I252 Old myocardial infarction: Secondary | ICD-10-CM | POA: Diagnosis not present

## 2013-12-27 DIAGNOSIS — Z5189 Encounter for other specified aftercare: Secondary | ICD-10-CM | POA: Diagnosis not present

## 2013-12-28 DIAGNOSIS — Z85828 Personal history of other malignant neoplasm of skin: Secondary | ICD-10-CM | POA: Diagnosis not present

## 2013-12-28 DIAGNOSIS — L940 Localized scleroderma [morphea]: Secondary | ICD-10-CM | POA: Diagnosis not present

## 2013-12-28 DIAGNOSIS — L821 Other seborrheic keratosis: Secondary | ICD-10-CM | POA: Diagnosis not present

## 2013-12-29 ENCOUNTER — Encounter (HOSPITAL_COMMUNITY)
Admission: RE | Admit: 2013-12-29 | Discharge: 2013-12-29 | Disposition: A | Discharge status: Home / Self Care | Payer: Medicare Other | Source: Ambulatory Visit | Attending: Cardiology | Admitting: Cardiology

## 2013-12-29 DIAGNOSIS — Z5189 Encounter for other specified aftercare: Secondary | ICD-10-CM | POA: Diagnosis not present

## 2013-12-29 DIAGNOSIS — I252 Old myocardial infarction: Secondary | ICD-10-CM | POA: Diagnosis not present

## 2014-01-01 ENCOUNTER — Encounter (HOSPITAL_COMMUNITY): Payer: Medicare Other

## 2014-01-03 ENCOUNTER — Encounter (HOSPITAL_COMMUNITY)
Admission: RE | Admit: 2014-01-03 | Discharge: 2014-01-03 | Disposition: A | Discharge status: Home / Self Care | Payer: Medicare Other | Source: Ambulatory Visit | Attending: Cardiology | Admitting: Cardiology

## 2014-01-03 DIAGNOSIS — Z5189 Encounter for other specified aftercare: Secondary | ICD-10-CM | POA: Diagnosis not present

## 2014-01-03 DIAGNOSIS — I252 Old myocardial infarction: Secondary | ICD-10-CM | POA: Diagnosis not present

## 2014-01-05 ENCOUNTER — Encounter (HOSPITAL_COMMUNITY)
Admission: RE | Admit: 2014-01-05 | Discharge: 2014-01-05 | Disposition: A | Discharge status: Home / Self Care | Payer: Medicare Other | Source: Ambulatory Visit | Attending: Cardiology | Admitting: Cardiology

## 2014-01-05 DIAGNOSIS — Z5189 Encounter for other specified aftercare: Secondary | ICD-10-CM | POA: Diagnosis not present

## 2014-01-05 DIAGNOSIS — I252 Old myocardial infarction: Secondary | ICD-10-CM | POA: Diagnosis not present

## 2014-01-08 ENCOUNTER — Encounter (HOSPITAL_COMMUNITY)
Admission: RE | Admit: 2014-01-08 | Discharge: 2014-01-08 | Disposition: A | Discharge status: Home / Self Care | Payer: Medicare Other | Source: Ambulatory Visit | Attending: Cardiology | Admitting: Cardiology

## 2014-01-08 DIAGNOSIS — Z5189 Encounter for other specified aftercare: Secondary | ICD-10-CM | POA: Diagnosis not present

## 2014-01-08 DIAGNOSIS — I252 Old myocardial infarction: Secondary | ICD-10-CM | POA: Diagnosis not present

## 2014-01-10 ENCOUNTER — Encounter (HOSPITAL_COMMUNITY)
Admission: RE | Admit: 2014-01-10 | Discharge: 2014-01-10 | Disposition: A | Discharge status: Home / Self Care | Payer: Medicare Other | Source: Ambulatory Visit | Attending: Cardiology | Admitting: Cardiology

## 2014-01-10 DIAGNOSIS — I252 Old myocardial infarction: Secondary | ICD-10-CM | POA: Diagnosis not present

## 2014-01-10 DIAGNOSIS — Z5189 Encounter for other specified aftercare: Secondary | ICD-10-CM | POA: Diagnosis not present

## 2014-01-12 ENCOUNTER — Encounter (HOSPITAL_COMMUNITY)
Admission: RE | Admit: 2014-01-12 | Discharge: 2014-01-12 | Disposition: A | Discharge status: Home / Self Care | Payer: Medicare Other | Source: Ambulatory Visit | Attending: Cardiology | Admitting: Cardiology

## 2014-01-12 DIAGNOSIS — I252 Old myocardial infarction: Secondary | ICD-10-CM | POA: Diagnosis not present

## 2014-01-12 DIAGNOSIS — Z5189 Encounter for other specified aftercare: Secondary | ICD-10-CM | POA: Diagnosis not present

## 2014-01-15 ENCOUNTER — Telehealth: Payer: Self-pay

## 2014-01-15 ENCOUNTER — Telehealth (HOSPITAL_COMMUNITY): Payer: Self-pay | Admitting: *Deleted

## 2014-01-15 ENCOUNTER — Emergency Department (HOSPITAL_COMMUNITY): Payer: Medicare Other

## 2014-01-15 ENCOUNTER — Other Ambulatory Visit: Payer: Self-pay

## 2014-01-15 ENCOUNTER — Encounter (HOSPITAL_COMMUNITY)
Admission: RE | Admit: 2014-01-15 | Discharge: 2014-01-15 | Disposition: A | Discharge status: Home / Self Care | Payer: Medicare Other | Source: Ambulatory Visit | Attending: Cardiology | Admitting: Cardiology

## 2014-01-15 ENCOUNTER — Observation Stay (HOSPITAL_COMMUNITY)
Admission: EM | Admit: 2014-01-15 | Discharge: 2014-01-16 | Disposition: A | Discharge status: Home / Self Care | Payer: Medicare Other | Attending: Cardiology | Admitting: Cardiology

## 2014-01-15 ENCOUNTER — Encounter (HOSPITAL_COMMUNITY): Payer: Self-pay | Admitting: Emergency Medicine

## 2014-01-15 DIAGNOSIS — I2510 Atherosclerotic heart disease of native coronary artery without angina pectoris: Secondary | ICD-10-CM | POA: Diagnosis not present

## 2014-01-15 DIAGNOSIS — Z882 Allergy status to sulfonamides status: Secondary | ICD-10-CM | POA: Insufficient documentation

## 2014-01-15 DIAGNOSIS — Z5189 Encounter for other specified aftercare: Secondary | ICD-10-CM | POA: Diagnosis not present

## 2014-01-15 DIAGNOSIS — E079 Disorder of thyroid, unspecified: Secondary | ICD-10-CM | POA: Diagnosis not present

## 2014-01-15 DIAGNOSIS — R0789 Other chest pain: Secondary | ICD-10-CM | POA: Insufficient documentation

## 2014-01-15 DIAGNOSIS — Z880 Allergy status to penicillin: Secondary | ICD-10-CM | POA: Insufficient documentation

## 2014-01-15 DIAGNOSIS — R918 Other nonspecific abnormal finding of lung field: Secondary | ICD-10-CM | POA: Diagnosis not present

## 2014-01-15 DIAGNOSIS — F411 Generalized anxiety disorder: Secondary | ICD-10-CM

## 2014-01-15 DIAGNOSIS — Z87891 Personal history of nicotine dependence: Secondary | ICD-10-CM | POA: Insufficient documentation

## 2014-01-15 DIAGNOSIS — Z79899 Other long term (current) drug therapy: Secondary | ICD-10-CM | POA: Insufficient documentation

## 2014-01-15 DIAGNOSIS — Z9861 Coronary angioplasty status: Secondary | ICD-10-CM | POA: Diagnosis not present

## 2014-01-15 DIAGNOSIS — M25519 Pain in unspecified shoulder: Principal | ICD-10-CM | POA: Insufficient documentation

## 2014-01-15 DIAGNOSIS — I10 Essential (primary) hypertension: Secondary | ICD-10-CM

## 2014-01-15 DIAGNOSIS — M25512 Pain in left shoulder: Secondary | ICD-10-CM

## 2014-01-15 DIAGNOSIS — G43909 Migraine, unspecified, not intractable, without status migrainosus: Secondary | ICD-10-CM | POA: Diagnosis not present

## 2014-01-15 DIAGNOSIS — I480 Paroxysmal atrial fibrillation: Secondary | ICD-10-CM | POA: Diagnosis present

## 2014-01-15 DIAGNOSIS — I951 Orthostatic hypotension: Secondary | ICD-10-CM | POA: Diagnosis present

## 2014-01-15 DIAGNOSIS — I252 Old myocardial infarction: Secondary | ICD-10-CM | POA: Diagnosis not present

## 2014-01-15 DIAGNOSIS — R079 Chest pain, unspecified: Secondary | ICD-10-CM | POA: Diagnosis not present

## 2014-01-15 DIAGNOSIS — Z91040 Latex allergy status: Secondary | ICD-10-CM | POA: Insufficient documentation

## 2014-01-15 DIAGNOSIS — E785 Hyperlipidemia, unspecified: Secondary | ICD-10-CM

## 2014-01-15 DIAGNOSIS — Z888 Allergy status to other drugs, medicaments and biological substances status: Secondary | ICD-10-CM | POA: Insufficient documentation

## 2014-01-15 DIAGNOSIS — Z7982 Long term (current) use of aspirin: Secondary | ICD-10-CM | POA: Diagnosis not present

## 2014-01-15 DIAGNOSIS — I255 Ischemic cardiomyopathy: Secondary | ICD-10-CM

## 2014-01-15 DIAGNOSIS — I2589 Other forms of chronic ischemic heart disease: Secondary | ICD-10-CM | POA: Diagnosis not present

## 2014-01-15 HISTORY — DX: Hypothyroidism, unspecified: E03.9

## 2014-01-15 HISTORY — DX: Cardiogenic shock: R57.0

## 2014-01-15 HISTORY — DX: Anemia, unspecified: D64.9

## 2014-01-15 HISTORY — DX: Bradycardia, unspecified: R00.1

## 2014-01-15 HISTORY — DX: Orthostatic hypotension: I95.1

## 2014-01-15 HISTORY — DX: Hyperlipidemia, unspecified: E78.5

## 2014-01-15 HISTORY — DX: Paroxysmal atrial fibrillation: I48.0

## 2014-01-15 HISTORY — DX: Atherosclerotic heart disease of native coronary artery without angina pectoris: I25.10

## 2014-01-15 LAB — PRO B NATRIURETIC PEPTIDE: Pro B Natriuretic peptide (BNP): 1705 pg/mL — ABNORMAL HIGH (ref 0–450)

## 2014-01-15 LAB — I-STAT TROPONIN, ED: Troponin i, poc: 0.05 ng/mL (ref 0.00–0.08)

## 2014-01-15 LAB — BASIC METABOLIC PANEL
BUN: 13 mg/dL (ref 6–23)
CHLORIDE: 103 meq/L (ref 96–112)
CO2: 24 mEq/L (ref 19–32)
Calcium: 10.5 mg/dL (ref 8.4–10.5)
Creatinine, Ser: 0.78 mg/dL (ref 0.50–1.10)
GFR calc Af Amer: 88 mL/min — ABNORMAL LOW (ref >90)
GFR calc non Af Amer: 76 mL/min — ABNORMAL LOW (ref >90)
Glucose, Bld: 97 mg/dL (ref 70–99)
POTASSIUM: 4.1 meq/L (ref 3.7–5.3)
Sodium: 140 mEq/L (ref 137–147)

## 2014-01-15 LAB — CBC
HCT: 40.7 % (ref 36.0–46.0)
Hemoglobin: 13.4 g/dL (ref 12.0–15.0)
MCH: 30.8 pg (ref 26.0–34.0)
MCHC: 32.9 g/dL (ref 30.0–36.0)
MCV: 93.6 fL (ref 78.0–100.0)
Platelets: 167 10*3/uL (ref 150–400)
RBC: 4.35 MIL/uL (ref 3.87–5.11)
RDW: 14.7 % (ref 11.5–15.5)
WBC: 7.5 10*3/uL (ref 4.0–10.5)

## 2014-01-15 LAB — TROPONIN I: Troponin I: <0.3 ng/mL (ref <0.30)

## 2014-01-15 MED ORDER — HEPARIN SODIUM (PORCINE) 5000 UNIT/ML IJ SOLN
5000.0000 [IU] | Freq: Three times a day (TID) | INTRAMUSCULAR | Status: DC
  Filled 2014-01-15 (×5): qty 1

## 2014-01-15 MED ORDER — SODIUM CHLORIDE 0.9 % IJ SOLN
3.0000 mL | Freq: Two times a day (BID) | INTRAMUSCULAR | Status: DC
  Administered 2014-01-15: 3 mL via INTRAVENOUS

## 2014-01-15 MED ORDER — ACETAMINOPHEN 325 MG PO TABS
650.0000 mg | ORAL_TABLET | Freq: Four times a day (QID) | ORAL | Status: DC | PRN
  Administered 2014-01-15 – 2014-01-16 (×2): 650 mg via ORAL
  Filled 2014-01-15 (×2): qty 2

## 2014-01-15 MED ORDER — LEVOTHYROXINE SODIUM 100 MCG PO TABS
100.0000 ug | ORAL_TABLET | Freq: Every day | ORAL | Status: DC
  Administered 2014-01-16: 100 ug via ORAL
  Filled 2014-01-15 (×2): qty 1

## 2014-01-15 MED ORDER — ADULT MULTIVITAMIN W/MINERALS CH
1.0000 | ORAL_TABLET | Freq: Every day | ORAL | Status: DC
  Administered 2014-01-16: 1 via ORAL
  Filled 2014-01-15: qty 1

## 2014-01-15 MED ORDER — LOSARTAN POTASSIUM 50 MG PO TABS
50.0000 mg | ORAL_TABLET | Freq: Every day | ORAL | Status: DC
  Administered 2014-01-16: 50 mg via ORAL
  Filled 2014-01-15: qty 1

## 2014-01-15 MED ORDER — MORPHINE SULFATE 4 MG/ML IJ SOLN
4.0000 mg | Freq: Once | INTRAMUSCULAR | Status: AC
  Administered 2014-01-15: 4 mg via INTRAVENOUS
  Filled 2014-01-15: qty 1

## 2014-01-15 MED ORDER — SERTRALINE HCL 50 MG PO TABS
50.0000 mg | ORAL_TABLET | Freq: Every day | ORAL | Status: DC
  Administered 2014-01-15 – 2014-01-16 (×2): 50 mg via ORAL
  Filled 2014-01-15 (×2): qty 1

## 2014-01-15 MED ORDER — PANTOPRAZOLE SODIUM 40 MG PO TBEC: 40.0000 mg | DELAYED_RELEASE_TABLET | Freq: Every day | ORAL | Status: DC | PRN

## 2014-01-15 MED ORDER — ALPRAZOLAM 0.25 MG PO TABS
0.2500 mg | ORAL_TABLET | Freq: Three times a day (TID) | ORAL | Status: DC | PRN
  Administered 2014-01-15: 0.25 mg via ORAL
  Filled 2014-01-15: qty 1

## 2014-01-15 MED ORDER — SODIUM CHLORIDE 0.9 % IV SOLN: 250.0000 mL | INTRAVENOUS | Status: DC | PRN

## 2014-01-15 MED ORDER — SODIUM CHLORIDE 0.9 % IJ SOLN: 3.0000 mL | INTRAMUSCULAR | Status: DC | PRN

## 2014-01-15 MED ORDER — ISOSORBIDE MONONITRATE ER 30 MG PO TB24
30.0000 mg | ORAL_TABLET | Freq: Every evening | ORAL | Status: DC
  Administered 2014-01-15: 30 mg via ORAL
  Filled 2014-01-15 (×2): qty 1

## 2014-01-15 MED ORDER — AMIODARONE HCL 200 MG PO TABS
200.0000 mg | ORAL_TABLET | Freq: Every day | ORAL | Status: DC
  Administered 2014-01-16: 200 mg via ORAL
  Filled 2014-01-15: qty 1

## 2014-01-15 MED ORDER — ASPIRIN 81 MG PO CHEW
81.0000 mg | CHEWABLE_TABLET | Freq: Every day | ORAL | Status: DC
  Administered 2014-01-16: 81 mg via ORAL
  Filled 2014-01-15: qty 1

## 2014-01-15 MED ORDER — TICAGRELOR 90 MG PO TABS
90.0000 mg | ORAL_TABLET | Freq: Two times a day (BID) | ORAL | Status: DC
  Administered 2014-01-15 – 2014-01-16 (×2): 90 mg via ORAL
  Filled 2014-01-15 (×3): qty 1

## 2014-01-15 MED ORDER — BUPROPION HCL ER (XL) 150 MG PO TB24
150.0000 mg | ORAL_TABLET | Freq: Every day | ORAL | Status: DC
  Administered 2014-01-16: 150 mg via ORAL
  Filled 2014-01-15: qty 1

## 2014-01-15 MED ORDER — CALCIUM CARBONATE-VITAMIN D 500-200 MG-UNIT PO TABS
2.0000 | ORAL_TABLET | Freq: Every day | ORAL | Status: DC
Start: 2014-01-16 — End: 2014-01-16
  Administered 2014-01-16: 2 via ORAL
  Filled 2014-01-15: qty 2

## 2014-01-15 MED ORDER — POLYVINYL ALCOHOL 1.4 % OP SOLN
1.0000 [drp] | Freq: Three times a day (TID) | OPHTHALMIC | Status: DC | PRN
  Filled 2014-01-15: qty 15

## 2014-01-15 MED ORDER — NITROGLYCERIN 0.4 MG SL SUBL: 0.4000 mg | SUBLINGUAL_TABLET | SUBLINGUAL | Status: DC | PRN

## 2014-01-15 MED ORDER — VITAMIN D3 25 MCG (1000 UNIT) PO TABS
1000.0000 [IU] | ORAL_TABLET | Freq: Every day | ORAL | Status: DC
  Administered 2014-01-15 – 2014-01-16 (×2): 1000 [IU] via ORAL
  Filled 2014-01-15 (×2): qty 1

## 2014-01-15 NOTE — ED Notes (Signed)
Pt reports pain behind her left shoulder and started on Friday. Reports that she had the same pain back in December when she had her MI. States that she talked with her cardiologist Dr. Tamala Julian and was advised to come here. Reports some SOB with activity, denies any nausea

## 2014-01-15 NOTE — Telephone Encounter (Signed)
received call from cardiac rehab.pt having chest discomfort and left arm pain.Dr.Smith reveiewed pt cadiac hx and adv pt go to the ED.pt reluctant to go.pt adv that is Dr.Smith final recommendation.adv pt that is the best place for her to be assessed.pt did not seem willing to go and hung up.called Collette at cardiac rehab she will call the pt  to try to convince her to go tot the ED and give pt husband an update

## 2014-01-15 NOTE — Telephone Encounter (Signed)
pt husband call back wanting speak with Dr.Smith/nurse.Dr.Smith adv pt husband that based on pt cardiac hx and pt symptoms., pt has not been seen by Dr.Smith pt should go to the ED.pt was reluctant and sts that they will figure it out.pt adv that decision was ultimately up to them., but Dr.Smith final recommendation is that pt go to the ED

## 2014-01-15 NOTE — ED Notes (Signed)
Family at bedside. 

## 2014-01-15 NOTE — ED Notes (Signed)
Introduced myself to the patient and her husband.  Patient is resting and comfortable.

## 2014-01-15 NOTE — H&P (Addendum)
History and Physical  Patient ID: Melinda Noble MRN: [id number], DOB: May 30, 1930 Date of Encounter: 01/15/2014, 5:48 PM Primary Physician: Henrine Screws, MD Primary Cardiologist: Dr. Ellyn Hack  Chief Complaint: left shoulder blade pain Reason for Admission: above symptoms  HPI: Melinda Noble is an 80 y/o F with history of CAD s/p STEMI 09/2013 c/b VDRF, cardiogenic shock, peri-MI PAF, (ICM last EF 40-45% in 09/2013 several days after STEMI), orthostatic hypotension 10/2013 due to Lasix who presents to [facility] with left shoulder blade pain. In 09/2013 her cath showed 100% mLAD with associated dissection - unclear whether it was spontaneous dissection versus related to the initial predilation balloon angioplasty, s/p 2 overlapping DES. She had moderate residual RCA disease. She has been going to cardiac rehab without chest pain but has unchanged low grade chronic dyspnea. She comes in today with left shoulder blade pain. This started on Saturday, occurring intermittently. It came on gradually. It is not particularly worse with any provocative measures. At its worst it was 5/10, is now down to 2/10 after morphine in the ER. She denies LEE, orthopnea, or acute dyspnea. She became concerned because she also thought her initial heart attack was musculoskeletal in origin like this pain feels. Initial troponin is negative, pBNP 1705 but CXR is nonacute. EKG is nonacute. She is hypertensive in the ER between 827-078M systolic. She states that BP has been labile in cardiac rehab recently - sometimes normal, sometimes 170s, and last OP check at home yesterday was 117/58.  Past Medical History  Diagnosis Date  . Hypertension   . Migraine headache   . Hypothyroidism   . Cardiogenic shock     a. 09/2013 - during admission for STEMI.  . H/O Acute respiratory failure due to hypoxia 10/10/2013    a. 09/2013 - secondary to anterior STEMI related pulmonary edema (resolved).  . CAD S/P percutaneous  coronary angioplasty 10/09/13    DES x 2 to Mid LAD, 2.5 x 38, 2.5 x 12 Promus (covering extensive disection), moderate RCA disesase  . Ischemic cardiomyopathy     a. EF 30-35% during 09/2013 adm for STEMI. b. Repeat Echo EF 40-45% with distal LAD hypokinesis (10/16/14 following STEMI recovery).  Marland Kitchen CAD (coronary artery disease)     a. STEMI 10/09/13 - 100% LAD - following initial angioplasty, ? Spontaneous dissection vs related to ballon. s/p complex PCI with 2 overlapping DES to LAD. Adm complicated by VDRF, cardiogenic shock, peri-MI PAF with NSR on amiodarone. Residual mod RCA disease.   Marland Kitchen Dyslipidemia   . PAF (paroxysmal atrial fibrillation)     a. 09/2013 - peri-MI, NSR at discharge on amiodarone.  . Orthostatic hypotension     a. Adm 10/2013 for this, felt due to Lasix. Not on regular diuretic due to this/dehydration.  . Sinus bradycardia   . Anemia   . Depression      Most Recent Cardiac Studies: 2D Echo 10/16/13 - HPI and indications: Limited study for LVEF. R/O apical thrombus. - Left ventricle: The cavity size was normal. There was mild concentric hypertrophy. Systolic function was mildly to moderately reduced. The estimated ejection fraction was in the range of 40% to 45%. There is mid to distal anterior and apical hypokinesis. Definity contrast was administered and the left ventricular apexis well-visualized. There is no evidence for apical thrombus. Late definity uptake is seen in the myocardial layer of the ventricular septum,anterior wall andout to the apex, suggesting myocardial perfusion and viability in this territory.  Cath 10/09/13  CARDIAC CATHETERIZATION AND PERCUTANEOUS CORONARY INTERVENTION REPORT  NAME: Melinda Noble MRN: [id number]  DOB: 06-21-1930 ADMIT DATE: 10/09/2013  Procedure Date: 10/10/2013  INTERVENTIONAL CARDIOLOGIST: Leonie Man, M.D., MS  PRIMARY CARE PROVIDER: Henrine Screws, MD  PRIMARY CARDIOLOGIST: Leonie Man  PATIENT: Melinda Noble is a 80 y.o. female with a history of hypertension and hypothyroidism but no prior coronary disease, as well as reportedly well controlled lipids. Earlier in the day 10/09/2013, she noted severe pain localizes in her chest and between her shoulder blades radiating to both shoulders. He initially went to her primary physician's office where an ECG failed to show any abnormalities. The pain persisted throughout the day the patient then presented to [facility] Emergency Room , where she was found to be quite hypertensive and described as sharp tearing type pain that sounded concerning for possible aortic dissection. She had diffuse anterolateral ST elevations but without reciprocal changes, in fact there is mild inferior solutions also noted. Based on these findings, and the concern of how she presented with the description or symptoms and hypertension, a baseline troponin was checked and she was taken for emergent CT Angiogram of the chest to rule out possible aortic dissection prior to anticoagulation. Her initial troponin was 1.2, was felt to be relatively low for ongoing chest pain all day long with possible anterior ST elevations.  The CT scan did not show any dissection, however upon arriving back to the emergency room the patient's pain got progressively worse, and a repeat ECG showed clear-cut anterior ST elevations of at least 3-4 mm with reciprocal changes present in at this time it was called the case was transported to the [facility] Cardiac Catheterization Lab via the emergency room for stabilization. Upon arrival she was in 9-10 out of 10 pain with significant anterior situations noted.  The decision made to proceed with emergent cardiac catheterization.  PRE-OPERATIVE DIAGNOSIS:  ANTERIOR STEMI PROCEDURES PERFORMED:  CORONARY ANGIOGRAPHY  COMPLEX PERCUTANEOUS CORONARY INTERVENTION OF 100% THROMBOTICALLY OCCLUDED MID LAD WITH PRE-DILATION RELATED DISSECTION WITH SUBINTIMAL  HEMATOMA - PCI WITH 2 OVERLAPPING DES PROMUS PREMIER (2.5 MM X 38 MM proximal; 2.5 MM X 12 MM distal overlap - post-dilated to ~3 mm)  INTRACORONARY THROMBECTOMY  INTRAVASCULAR ULTRASOUND OF DISTAL TO MID LAD  PTCA OF DISTAL LAD  PLACEMENT OF CENTRAL VENOUS LINE - RIGHT COMMON FEMORAL VEIN  PLACEMENT OF RIGHT COMMON FEMORAL ARTERIAL SHEATH FOR POSSIBLE IABP  INTUBATION - BY ANESTHESIOLOGY PROCEDURE:Consent: Risks of procedure as well as the alternatives and risks of each were explained to the (patient/caregiver). Emergency Verbal Consent for procedure obtained -- Unable to obtain consent because of emergent medical necessity.  PROCEDURE: The patient was brought to the [facility] in the fasting state and prepped and draped in the usual sterile fashion for Right groin or radial access. A modified Allen's test with plethysmography was performed, revealing excellent Ulnar artery collateral flow. Sterile technique was used including antiseptics, cap, gloves, gown, hand hygiene, mask and sheet. Skin prep: Chlorhexidine.  Time Out: Verified patient identification, verified procedure, site/side was marked, verified correct patient position, special equipment/implants available, medications/allergies/relevent history reviewed, required imaging and test results available. Performed  Access: RIGHT RADIAL Artery; 6 Fr Sheath -- Seldinger technique (Angiocath Micropuncture Kit)  IA Radial Cocktail, IV ANGIOMAX Diagnostic: 5 Fr TIG 4.0, 6 Fr XB LAD 3.5 advanced and exchanged over long exchange safety J wire  Left & Right Coronary Artery Angiography: TIG  4.0 After Initial No-Reflow post Stent dilation & Intubation -- The decision was made to obtain Emergent Femoral Arterial & Venous Access for possible IABP & Vasopressor medications.  Right Common Femoral Artery & Vein - Unfortunately the patient coughed during initial access attempt & access was lost; pressure held x 4 min;  then first Venous followed by Arterial Access was obtained.  Arterial Access: RFA - 6 Fr; Modified Seldinger technique (very difficult to advance wire, required needle repositioning)  Venous Access: RFV - 6 Fr; Seldinger Technique PCI: 6 Fr XB LAD 3.5 TR Band: 1445 Hours, 13 mL air (small hematoma noted - pt was moving arm extensively during procedure.  RFA & RFV lines sutured into place & manual pressure held x 2 for ~15-20 min for hematoma noted after sheath placement; site was soft upon transfer to CCU. MEDICATIONS:  Anesthesia: Local Lidocaine 2 ml - R wrist; 15 ml R Groin. Sedation: 2 mg IV Versed, 100 mcg IV fentanyl ; Followed by Fentanyl & Versed gtt  Premedication: 125 mcg of intravenous Solu-Medrol, 50 mg IV Benadryl and 20 mg IV Pepcid given at [facility] long prior to CT angiogram of the chest Omnipaque Contrast: 300-320 ml  Anticoagulation: Angiomax Bolus & drip  Anti-Platelet Agent: Aggrastat single bolus with drip administered after initial stent deployment. Radial Cocktail: 5 mg Verapamil, 400 mcg NTG, 2 ml 2% Lidocaine in 10 ml NS  Intracoronary Nitroglycerin 200 mcg x3 injections  Intracoronary Adenosine 12 mcg x2 injections  Levophed drip initiated at 10 mcg per kilogram per minute increased to 20 mcg kilogram per minute Hemodynamics:  Central Aortic / Mean Pressures: Initial pressure is 145/75 mmHg; final pressure on 20 mcg of Levophed 124/70 mmHg Left Ventriculography: Aortic valve was not crossed, and she plans were to perform LV gram after PCI, however the activity or seizure and the patient's instability decision was made to the procedure  Coronary Anatomy:  Left Main: Large-caliber vessel that bifurcates into a small caliber nondominant Circumflex and a moderate to large caliber LAD LAD: Large arge-caliber vessel the proximal 50% eccentric lesion. After this there is a moderate caliber first diagonal branch and a significant septal perforator trunk followed by a 100%  occlusion of the LAD in the mid vessel.  Following initial angioplasty, a focal dissection is noted just beyond the initial occlusion site. This did not appear to extend to the level of the second diagonal branch which was also a moderate caliber vessel. The LAD distally remained relatively large-caliber vessel distally is a wraps the apex.  D1: Moderate caliber relatively proximal branch that was not involved with the stenosis. However there was some diffuse irregularities at the takeoff of this vessel that required a stent the past proximal to the takeoff.  D2: Small moderate caliber vessel also not involved lesion, but jeopardized by the upstream parent vessel occlusion Left Circumflex: Small moderate caliber vessel that gives off a small AV groove branch then bifurcates into OM 1 and OM 2 with minimal luminal irregularities.  RCA: Very large caliber, dominant vessel with a inferior takeoff. There is a 60-70% short acentric stenosis at the first bend. The vessel then mobilize into a large caliber vessel that bifurcates distally into the Right Posterior Descending Artery and the Right Posterior AV Groove Branch (RPAV); after the initial lesion, there minimal luminal irregularities.  RPDA: Large-caliber vessel it reaches all again the apex. Minimal luminal irregularities.  RPL Sysytem:The RPAV he is a moderate caliber vessel that essentially terminates as a large  posterior lateral branch. Initial review of the angiography were revealed the clear-cut culprit lesion the pancreas elevations being the mid LAD occlusion. Preparations were then made to proceed with percutaneous intervention. IV Angiomax was used at time of sheath placement. Plans were to administer Brilinta, however Echinacea she the procedure patient was requiring nonrebreather mask for oxygenation and was quite nauseated. Therefore there was concern that she would not be to keep the medication down. I then decided to use IV Aggrastat single  bolus.  Percutaneous Coronary Intervention:  100% mid LAD occlusion followed by dissection with intramural hematoma. 0% stenosis post-PCI  TIMI 0 flow pre-PCI TIMI 2 flow post PCI  Guide: 6 Fr XB LAD  Guidewire: A total of 4 wires were used --2 BMW wires (initial wire was BMW), A Pro-Water Soft wire followed by a Luge wire final PCI wire was Luge)  The initial BMW wire had difficulty passing beyond the first septal trunk, therefore the predilation balloon was advanced for wire support, wire then seem to easily into the distal LAD in several occasions passing into small branches, therefore confirming that the wire remained intraluminal. Predilation Balloon: Sprinter Legend 2.0 mm x 15 mm;  8 Atm x 30 Sec, 8 Atm x 40 Sec,  After initial inflations there did appear to be a initially focal dissection just distal to the initial occlusion.  Third inflation at the dissection site: 8 Atm x 45 Sec  At this point decision was made to cover the entire section with a stent. The area in question range from just at the D1 takeoff to just proximal to D2 Stent #1: Promus Premier 2.5 mm x 38 mm;  Initial deployment: 12 Atm x 30 Sec, post inflation angiography revealed restoration of flow with no clear evidence of residual dissection, therefore the balloon was used to post dilate gently.  Post-dilation with stent balloon: 16 Atm x 45 Sec Unfortunately after this inflation, no reflow phenomenon was noted beyond the proximal portion of the stent. At this point the patient had significant changes and worsening of the anterior ST elevations. Her pain became close to unbearable.  1 pass with the Pronto aspiration thrombectomy catheter was performed, with no significant benefit.  Cover more inflations with the original predilation balloon were performed at 8 and 12 atmospheres for 50 seconds along the stented segment. It was at this point that the Aggrastat was initiated.  With increased sedation, the patient's  respiratory status became somewhat compromised due to a history of OSA. At this point, the decision made to have anesthesia come to assist with intubation. Right common femoral arterial and venous lines were placed, with some difficulty initially with arterial access. Pressure was held when a hematoma was noted to be present.  CT surgery consultation was also obtained and Dr. Roxan Hockey graciously came in. At this point there was concern of possible propagation of the dissection. After some consideration the decision made to place a second stent overlapping the distal portion of the first stent as there was the appearance of possible subintimal hematoma.  Several additional wires were used to ensure that the initial wire was intraluminal. Each wire was easily passed into a branch of the main LAD channel.  Stent #2: Promus Premier 2.5 mm x 12 mm; 3 wires were placed including 2 BMW wires in the D2 and distal S/P branches with the working wire now being the Luge that was clearly noted to pass into a diagonal branch and a septal perforator beyond the stented  segment before being passed distally. The 2 BMW wires were removed before deployment of the stent.  Initial deployment: 12 Atm x 30 Sec, post inflation angiography revealed restoration of flow with no clear evidence of residual dissection, therefore the balloon was used to post dilate gently at the overlapped segment.  Post-dilation with stent balloon: 14 Atm x 45 Sec  Again post inflation angiography revealed poor/slow reflow.  Intracoronary adenosine was administered, somewhat improved flow was noted initially but then all by again no reflow phenomenon. At this point Dr. Burt Knack graciously came in to be of assistance. After reviewing the images, we decided to perform intravascular ultrasound to determine if the dissection was fully covered, and the extent of intramural hematoma  Intravascular Ultrasound (IVUS)  The IVUS catheter was advanced over the  luge wire down to the distal LAD and a complete pullback was performed. This demonstrated normal lumen downstream beyond the stent, with a well covered dissection plane and intramural hematoma.  The IVUS catheter was then removed. The decision was made to perform low grade inflations with a compliant balloon throughout the entire distal LAD and stented segment. The vessel downstream appear to be close to 3 mm diameter. Stent segment did appear to be relatively well apposed. Post-dilation Balloon: Emerge Monorail compliant balloon 3.0 mm x 30 mm  Inflations were made at the distal stent extending distally for 2 overlapping inflations, then the balloon was pulled back into the stented segment for post dilation of the stent. At distal stent overlap: 6 Atm x 45 Sec, (3.0 mm)  Distal LAD: 4 Atm x 45 Sec (2.85 mm)  2 inflations within the stented segment: 12 Atm x 45 Sec; Final Diameter: 3.0 mm  additional injections of intracoronary nitroglycerin glycerin and intracoronary adenosine were administered. Post deployment angiography in multiple views, with and without guidewire in place revealed excellent stent deployment and lesion coverage, However there were only remained TIMI 2 flow post PCI that did reach all the way down to the apex on final angiography. There was no evidence of residual dissection or perforation.  Angiomax was discontinued at this point At this point, the decision was made to terminate the procedure. The plan for using a pigtail catheter across aortic valve was abandoned due to the duration of procedure. An echocardiogram will be ordered.  After completion of the PCI the catheter was removed with the out of body over wire.  The Radial sheath was removed with placement of a TR band. A mild amount hematoma was noted due to extensive movement of the sheath.  Direct manual pressure was held on the right groin for 20 minutes with the arterial and venous sheaths sutured in place. Minimal  residual hematomas noted, therefore decision was made not to place a FemoStop. Type and cross was checked for precautionary measures.  PATIENT DISPOSITION:  The patient was transferred to the CCU intubated and critically ill, however essentially hemodynamicaly stable in addition, albeit on Levophed. There was definite improvement in the ECG post PCI.  The patient became hypoxic, and hypotensive with extreme ST elevations during the initial slow reflow timeframe. She was intubated for airway management, partly because of hypoxia and to allow for no sedation but also noted as the patient noted being extremely nauseated.  EBL: External EBL likely less than 40 ml, however in the femoral hematoma quite likely 50-60 mL additional loss minimum.  The patient was initially unstable, stabilized with intubation and pressors followed by completed PCI with restoration of TIMI 2  flow in the LAD. POST-OPERATIVE DIAGNOSIS:  100% thrombotic occluded mid LAD associated dissection. It is unclear whether this was a spontaneous dissection versus related to the initial predilation balloon angioplasty. There is presence of intramural thrombus at the initial dissection site.  Successful complex PCI of a long segment of LAD using 2 overlapping Promus Premier DES stents as described incorporating balloon angioplasty, aspiration thrombectomy and intracoronary/IVUS with restoration of TIMI 2 flow after initial no reflow phenomenon.  Suspect that the patient's prolonged chest pain may very well have indicated prolonged LAD occlusion and therefore myocardial stunning leading to no reflow phenomenon after balloon angioplasty, dissection and intramural hematoma Moderate proximal LAD disease and moderate to significant proximal RCA disease in a large dominant RCA  Likely large anterior infarct, will confirm with echocardiogram, associated cardiogenic shock requiring vasopressor therapy  Hypoxic respiratory failure, likely secondary to  sedation plus or minus Pulmonary edema requiring emergent intubation by anesthesiology  Moderate sized right femoral hematoma and small to moderate right radial hematoma, both appear stable upon transfer to CCU.  Mixed respiratory and metabolic acidosis PLAN OF CARE:  Admit to CCU, with critical care to assist with ventilation status. May require intravenous bicarbonate for acidosis  Post radial cath care  Will remove femoral lines in the morning once hemodynamic stable, and no need for central venous access  Administer loading dose of Ticagrelor per OG tube once placed  Would hope to begin to wean Levophed throughout the morning. Monitor for reperfusion arrhythmias and bradycardia  Monitor for signs of heart failure, may require diuresis, for now we'll allow for pressure stabilization as she is intubated. Consider diuresis as needed for oxygenation, and based on echocardiographic findings.  Right heart catheterization was not performed due to to the prolonged intervention procedure. I personally spent 20-25 minutes discussing the patient's prognosis, presentation, and the complete procedure with the patient family including her husband, 2 sons and 2 daughters along with one son-in-law.  Leonie Man, M.D., M.S.  [facility]  [address] [place], [place] [address]  [phone]  10/10/2013  3:29 AM   Surgical History:  Past Surgical History  Procedure Laterality Date  . Shoulder surgery    . Coronary angioplasty with stent placement  10/09/13    2 overlapping Promus DES to mid LAD, occlusion/dissection (2.5 x 38, 2.5 x 12)     Home Meds: Prior to Admission medications   Medication Sig Start Date End Date Taking? Authorizing Provider  ALPRAZolam (XANAX) 0.25 MG tablet Take 0.25 mg by mouth 3 (three) times daily as needed for anxiety. 10/26/13  Yes Claudette Jeri Cos, NP  amiodarone (PACERONE) 200 MG tablet Take 1 tablet (200 mg total) by mouth  daily. 11/26/13  Yes Geradine Girt, DO  aspirin 81 MG chewable tablet Chew 1 tablet (81 mg total) by mouth daily. 10/17/13  Yes Brittainy Rosita Fire, PA-C  Biotin 1000 MCG tablet Take 1,000 mcg by mouth daily.   Yes Historical Provider, MD  buPROPion (WELLBUTRIN XL) 150 MG 24 hr tablet Take 150 mg by mouth daily.   Yes Historical Provider, MD  calcium-vitamin D (OSCAL WITH D) 500-200 MG-UNIT per tablet Take 2 tablets by mouth daily.   Yes Historical Provider, MD  cholecalciferol (VITAMIN D) 1000 UNITS tablet Take 1,000 Units by mouth daily.   Yes Historical Provider, MD  levothyroxine (SYNTHROID, LEVOTHROID) 100 MCG tablet Take 100 mcg by mouth daily before breakfast.   Yes Historical Provider, MD  losartan (COZAAR) 50 MG tablet Take 1 tablet (50 mg total) by mouth daily. 11/06/13  Yes Tarri Fuller, PA-C  Multiple Vitamins-Minerals (MULTIVITAMIN WITH MINERALS) tablet Take 1 tablet by mouth daily.   Yes Historical Provider, MD  nitroGLYCERIN (NITROSTAT) 0.4 MG SL tablet Place 1 tablet (0.4 mg total) under the tongue every 5 (five) minutes as needed for chest pain. 11/22/13  Yes Leonie Man, MD  omeprazole (PRILOSEC) 20 MG capsule Take 20 mg by mouth daily as needed.   Yes Historical Provider, MD  Polyethyl Glycol-Propyl Glycol (SYSTANE OP) Place 1-2 drops into both eyes 3 (three) times daily as needed (for dry eye or irritation).   Yes Historical Provider, MD  sertraline (ZOLOFT) 50 MG tablet Take 50 mg by mouth daily.   Yes Historical Provider, MD  Ticagrelor (BRILINTA) 90 MG TABS tablet Take 1 tablet (90 mg total) by mouth 2 (two) times daily. 10/17/13  Yes Brittainy Simmons, PA-C  vitamin E (VITAMIN E) 400 UNIT capsule Take 400 Units by mouth daily.   Yes Historical Provider, MD  acetaminophen (TYLENOL) 500 MG tablet Take 500-1,000 mg by mouth every 6 (six) hours as needed for mild pain.    Historical Provider, MD    Allergies:  Allergies  Allergen Reactions  . Ace Inhibitors Anaphylaxis  . Beta  Adrenergic Blockers Anaphylaxis  . Iohexol Anaphylaxis     Code: HIVES, Desc: anaphylactic shock s/p contrast injection many yrs ago--suggested that pt NEVER have iv contrast//a.c., Onset Date: [id number]   . Atorvastatin Rash  . Latex Rash  . Penicillins Swelling and Rash  . Sulfa Antibiotics Rash    History   Social History  . Marital Status: Married    Spouse Name: N/A    Number of Children: N/A  . Years of Education: N/A   Occupational History  . Not on file.   Social History Main Topics  . Smoking status: Former Research scientist (life sciences)  . Smokeless tobacco: Not on file  . Alcohol Use: No  . Drug Use: No  . Sexual Activity: Not on file   Other Topics Concern  . Not on file   Social History Narrative   Patient is Married, since 1958 [name])   Lives in apartment, Independent Living section at [facility] since 04/2013.   Stopped Smoking many years ago, minimal aalcohol history.   Regular exercise includes swimming 3d/week,   Patient has no Advanced planning documents     Family History  Problem Relation Age of Onset  . Varicose Veins Father     Review of Systems: see above. Denies bleeding issues. All other systems reviewed and are otherwise negative except as noted above.  Labs:   Lab Results  Component Value Date   WBC 7.5 01/15/2014   HGB 13.4 01/15/2014   HCT 40.7 01/15/2014   MCV 93.6 01/15/2014   PLT 167 01/15/2014    Recent Labs Lab 01/15/14 1427  NA 140  K 4.1  CL 103  CO2 24  BUN 13  CREATININE 0.78  CALCIUM 10.5  GLUCOSE 97   POC troponin neg x 1 Lab Results  Component Value Date   CHOL 98 10/23/2013   HDL 33* 10/23/2013   LDLCALC 46 10/23/2013   TRIG 130 10/23/2013    Radiology/Studies:  Dg Chest 2 View  01/15/2014   CLINICAL DATA:  Pain under left scapula  EXAM: CHEST  2 VIEW  COMPARISON:  11/25/2013  FINDINGS: Cardiac shadow remains enlarged. The lungs are well aerated bilaterally.  No focal infiltrate or sizable effusion is seen. No  acute bony abnormality is noted. Degenerative change of the thoracic spine is seen.  IMPRESSION: No active cardiopulmonary disease.   Electronically Signed   By: Inez Catalina M.D.   On: 01/15/2014 15:53    EKG: NSR 69bpm, left axis deviation, TWI V6, flattening I, avL, not significantly changed from   Physical Exam: Blood pressure 171/79, pulse 67, temperature 98.7 F (37.1 C), temperature source Oral, resp. rate 16, height 5' 6.5" (1.689 m), weight 194 lb (87.998 kg), SpO2 98.00%. General: Well developed, well nourished, in no acute distress. Head: Normocephalic, atraumatic, sclera non-icteric, no xanthomas, nares are without discharge.  Neck: JVD not elevated. Lungs: Clear bilaterally to auscultation without wheezes, rales, or rhonchi. Breathing is unlabored. Heart: RRR with S1 S2. Soft SEM. No rubs or gallops appreciated. Abdomen: Soft, non-tender, non-distended with normoactive bowel sounds. No hepatomegaly. No rebound/guarding. No obvious abdominal masses. Msk:  Strength and tone appear normal for age. Extremities: No clubbing or cyanosis. No edema.  Distal pedal pulses are 2+ and equal bilaterally. Neuro: Alert and oriented X 3. No focal deficit. No facial asymmetry. Moves all extremities spontaneously. Psych:  Responds to questions appropriately with a normal affect.    ASSESSMENT AND PLAN:  1. Left shoulder blade pain 2. CAD s/p STEMI 2014 s/p DESx2, residual moderate RCA disease 3. ICM EF 40-45% by echo 10/16/13, currently appears compensated 4. HTN, ? Labile 5. PAF per-MI, on amiodarone, maintaining NSR 6. H/o orthostatic hypotension  Pt seen and examined with Dr. Meda Coffee - see below for comprehensive plan.  Signed, Melina Copa PA-C 01/15/2014, 5:48 PM  The patient was seen, examined and discussed with Melina Copa, PA-C and I agree with the above.   In summary, Mrs Didonato is a very pleasant female with significant h/o CAD, s/p STEMI and PCI to LAD with 2 overlapping stents in  December 2014 with residual LV dysfunction LVEF 40%.  The patient is undergoing cardiac rehabilitation. Today she is coming for a constant non-resolving left shoulder blade pain that started on Saturday. The pain was not affected by exercise at the rehab facility. No associated symptoms such as SOB, diaphoresis, dizziness or syncope. She states that her BP has been "all over the place".  On physical exam she point a spot on her back that is painful on palpation, she states that in the past it would resolve with massage or physical therapy.  The first troponin is negative, ECG is unchanged from prior in February. We will admit for observation. If troponin negative, and ECG unchanged, we will discharge in the morning. Repeat echocardiogram to re-assess LVEF. We will add Imdur 30 mg po daily for hypertension. (intolerance of BB and ACEI).  Dorothy Spark 01/15/2014

## 2014-01-15 NOTE — ED Notes (Signed)
Ordered [facility] Healthy tray

## 2014-01-15 NOTE — Telephone Encounter (Signed)
Received call from [facility] regarding Dr. Tamala Julian assessment.  Pt was advised to seek treatment at the ER verses follow up in the office as a new consult.  Called and spoke to husband who is with pt.  Pt upset about the plan of care and does not want to go to the ER.  Talked with Husband and extended period of time regarding the importance of evaluation.  Pt's husband said they would talk about it and decide what they should be.  Advised pt's husband that medical clearance will be needed before she may return to exercise since her symptoms may be cardiac in nature.  Pt's husband verbalized understanding and thanked me for calling.

## 2014-01-15 NOTE — ED Notes (Signed)
MD at bedside.cardiology  

## 2014-01-15 NOTE — ED Notes (Signed)
Paged cardiology to place order for admit. PA placed order

## 2014-01-15 NOTE — ED Provider Notes (Signed)
CSN: [id number]     Arrival date & time 01/15/14  1418 History   First MD Initiated Contact with Patient 01/15/14 1500     Chief Complaint  Patient presents with  . Chest Pain      HPI  Patient p/w back / chest pain. This episode began ~5d pta, and became worse ~2d pta. The pain is focally between the scapula, sharp, severe.  The pain is not improved w anything, and is not exertional or pleuritic. There is no associated dyspnea, LH / syncope, n/v/d, f/c, cough. Patient is compliant with all meds. Patient's Hx is notable for STEMI 12/14. This pain is similar to that she experienced prior to that event.   Past Medical History  Diagnosis Date  . Hypertension   . Migraine headache   . Thyroid disease   . Depression   . H/O Acute respiratory failure due to hypoxia 10/10/2013    secondary to anterior STEMI related pulmonary edema (resolved)  . ST elevation (STEMI) myocardial infarction involving left anterior descending coronary artery 10/09/13    Mid LAD occulsion s/p PCI (extensive disection)  . CAD S/P percutaneous coronary angioplasty 10/09/13    DES x 2 to Mid LAD, 2.5 x 38, 2.5 x 12 Promus (covering extensive disection), moderate RCA disesase  . Ischemic cardiomyopathy 10/16/13    Repeat Echo EF 40-45% with distal LAD hypokenesis improved from initial, post STEMI   Past Surgical History  Procedure Laterality Date  . Shoulder surgery    . Coronary angioplasty with stent placement  10/09/13    2 overlapping Promus DES to mid LAD, occlusion/dissection (2.5 x 38, 2.5 x 12)   Family History  Problem Relation Age of Onset  . Varicose Veins Father    History  Substance Use Topics  . Smoking status: Former Research scientist (life sciences)  . Smokeless tobacco: Not on file  . Alcohol Use: No   OB History   Grav Para Term Preterm Abortions TAB SAB Ect Mult Living                 Review of Systems  Constitutional:       Per HPI, otherwise negative  HENT:       Per HPI, otherwise negative   Respiratory:       Per HPI, otherwise negative  Cardiovascular:       Per HPI, otherwise negative  Gastrointestinal: Negative for vomiting.  Endocrine:       Negative aside from HPI  Genitourinary:       Neg aside from HPI   Musculoskeletal:       Per HPI, otherwise negative  Skin: Negative.   Neurological: Negative for syncope.      Allergies  Ace inhibitors; Beta adrenergic blockers; Iohexol; Atorvastatin; Latex; Penicillins; and Sulfa antibiotics  Home Medications   Current Outpatient Rx  Name  Route  Sig  Dispense  Refill  . acetaminophen (TYLENOL) 500 MG tablet   Oral   Take 500-1,000 mg by mouth every 6 (six) hours as needed for mild pain.         Marland Kitchen ALPRAZolam (XANAX) 0.25 MG tablet   Oral   Take 0.25 mg by mouth 3 (three) times daily as needed for anxiety.         Marland Kitchen amiodarone (PACERONE) 200 MG tablet   Oral   Take 1 tablet (200 mg total) by mouth daily.   30 tablet   0   . aspirin 81 MG chewable tablet   Oral  Chew 1 tablet (81 mg total) by mouth daily.         . Biotin 1000 MCG tablet   Oral   Take 1,000 mcg by mouth daily.         Marland Kitchen buPROPion (WELLBUTRIN XL) 150 MG 24 hr tablet   Oral   Take 150 mg by mouth daily.         . calcium-vitamin D (OSCAL WITH D) 500-200 MG-UNIT per tablet   Oral   Take 2 tablets by mouth daily.         . cholecalciferol (VITAMIN D) 1000 UNITS tablet   Oral   Take 1,000 Units by mouth daily.         . fexofenadine (ALLEGRA) 60 MG tablet   Oral   Take 60 mg by mouth 2 (two) times daily as needed for allergies or rhinitis.         . furosemide (LASIX) 20 MG tablet   Oral   Take 1 tablet (20 mg total) by mouth daily as needed (Weight gain of 5 lbs in a week, or 3 lbs in a day.).   30 tablet   5   . levothyroxine (SYNTHROID, LEVOTHROID) 100 MCG tablet   Oral   Take 100 mcg by mouth daily before breakfast.         . losartan (COZAAR) 50 MG tablet   Oral   Take 1 tablet (50 mg total) by mouth  daily.   30 tablet   5   . Multiple Vitamins-Minerals (MULTIVITAMIN WITH MINERALS) tablet   Oral   Take 1 tablet by mouth daily.         . nitroGLYCERIN (NITROSTAT) 0.4 MG SL tablet   Sublingual   Place 1 tablet (0.4 mg total) under the tongue every 5 (five) minutes as needed for chest pain.   25 tablet   6   . omeprazole (PRILOSEC) 20 MG capsule   Oral   Take 20 mg by mouth daily as needed.         [name] Glycol-Propyl Glycol (SYSTANE OP)   Both Eyes   Place 1-2 drops into both eyes 3 (three) times daily as needed (for dry eye or irritation).         . sertraline (ZOLOFT) 50 MG tablet   Oral   Take 50 mg by mouth daily.         . Ticagrelor (BRILINTA) 90 MG TABS tablet   Oral   Take 1 tablet (90 mg total) by mouth 2 (two) times daily.   60 tablet   5   . vitamin E (VITAMIN E) 400 UNIT capsule   Oral   Take 400 Units by mouth daily.          BP 167/74  Pulse 63  Temp(Src) 98.7 F (37.1 C) (Oral)  Resp 14  Ht 5' 6.5" (1.689 m)  Wt 194 lb (87.998 kg)  BMI 30.85 kg/m2  SpO2 98% Physical Exam  Nursing note and vitals reviewed. Constitutional: She is oriented to person, place, and time. She appears well-developed and well-nourished. No distress.  HENT:  Head: Normocephalic and atraumatic.  Eyes: Conjunctivae and EOM are normal.  Cardiovascular: Normal rate and regular rhythm.   Pulmonary/Chest: Effort normal and breath sounds normal. No stridor. No respiratory distress.  Abdominal: She exhibits no distension.  Musculoskeletal: She exhibits no edema.  No gross deformity, shoulders wnl. Mild ttp about the medial L scapula edge.  Neurological: She is alert  and oriented to person, place, and time. No cranial nerve deficit.  Skin: Skin is warm and dry.  Psychiatric: She has a normal mood and affect.    ED Course  Procedures (including critical care time) Labs Review Labs Reviewed  BASIC METABOLIC PANEL - Abnormal; Notable for the following:     GFR calc non Af Amer 76 (*)    GFR calc Af Amer 88 (*)    All other components within normal limits  PRO B NATRIURETIC PEPTIDE - Abnormal; Notable for the following:    Pro B Natriuretic peptide (BNP) 1705.0 (*)    All other components within normal limits  CBC  I-STAT TROPOININ, ED   Imaging Review Dg Chest 2 View  01/15/2014   CLINICAL DATA:  Pain under left scapula  EXAM: CHEST  2 VIEW  COMPARISON:  11/25/2013  FINDINGS: Cardiac shadow remains enlarged. The lungs are well aerated bilaterally. No focal infiltrate or sizable effusion is seen. No acute bony abnormality is noted. Degenerative change of the thoracic spine is seen.  IMPRESSION: No active cardiopulmonary disease.   Electronically Signed   By: Inez Catalina M.D.   On: 01/15/2014 15:53     EKG Interpretation   Date/Time:  Monday January 15 2014 14:22:10 EDT Ventricular Rate:  68 PR Interval:  204 QRS Duration: 92 QT Interval:  384 QTC Calculation: 408 R Axis:   -69 Text Interpretation:  Normal sinus rhythm Left axis deviation Anterior  infarct , age undetermined Abnormal ECG Sinus rhythm Left axis deviation T  wave abnormality No significant change since last tracing Abnormal ekg  Confirmed by Carmin Muskrat  MD [id number]) on 01/15/2014 3:10:16 PM     EMR review demonstrates that the patient's eval pre-STEMI in 12/14 included CTA to r/o dissection from back pain - negative studies. Cath showed LAD obstruction.  4:46 PM I discussed the patient's case with cardiology.  On re-exam the patient appears comfortable.   MDM   This patient p/w intra-scapular pain.  Patient has a notable history of recent STEMI, prior to which she had similar pain. Patient's evaluation here is largely reassuring, though she continues to have pain during her emergency department course.  Discussed the patient's case with our cardiology team, and they will assist with her disposition, evaluation.   Carmin Muskrat, MD 01/15/14 1726

## 2014-01-15 NOTE — Progress Notes (Signed)
01/15/2014 1:50 PM Note Time: 01/15/2014 1:36 PM Status: Signed   Editor: Rowe Pavy, RN (Registered Nurse)      Pt in today for 1115 exercise class. During exercise pt complained of left shoulder discomfort that radiated to her back on Saturday. Pt rated the pain at rest - 6 to 7 with a rating of 20 when she was acutely having a MI in December. Pt was unsure if this was indigestion or muscular. Pt took tylenol and mylanta with some relief but the discomfort returned. Pt rated the discomfort at 3 or 4 for the remainder of the weekend. Exercise stopped and pt was further assessed. Pt felt this discomfort was very similar to discomfort she had 2 weeks prior to her actual MI in December. Pt desired to be seen in the office for evaluation. Pt has an upcoming appt on 4/23 with Dr. Tamala Julian. Pt desired to change from Dr. Ellyn Hack to Dr. Tamala Julian for personal reasons. Office called, Dr. Tamala Julian is in the office today. Lattie Haw Dr. Tamala Julian assistant asked for rehab report and note to be faxed over for review and determination of plan of care. Pt agreed with this and verbalized understanding. Contact information provided on fax. Fax number busy. Called office to get alternate fax number to send information. After several tries able to get fax number [phone]. Report faxed over at [phone]. Cherre Huger, BSN

## 2014-01-16 DIAGNOSIS — Z5189 Encounter for other specified aftercare: Secondary | ICD-10-CM | POA: Diagnosis not present

## 2014-01-16 DIAGNOSIS — M25519 Pain in unspecified shoulder: Secondary | ICD-10-CM | POA: Diagnosis not present

## 2014-01-16 DIAGNOSIS — I10 Essential (primary) hypertension: Secondary | ICD-10-CM

## 2014-01-16 DIAGNOSIS — F411 Generalized anxiety disorder: Secondary | ICD-10-CM | POA: Diagnosis not present

## 2014-01-16 DIAGNOSIS — I252 Old myocardial infarction: Secondary | ICD-10-CM | POA: Diagnosis not present

## 2014-01-16 DIAGNOSIS — I2589 Other forms of chronic ischemic heart disease: Secondary | ICD-10-CM | POA: Diagnosis not present

## 2014-01-16 DIAGNOSIS — I369 Nonrheumatic tricuspid valve disorder, unspecified: Secondary | ICD-10-CM

## 2014-01-16 LAB — BASIC METABOLIC PANEL
BUN: 15 mg/dL (ref 6–23)
CHLORIDE: 103 meq/L (ref 96–112)
CO2: 24 mEq/L (ref 19–32)
Calcium: 10.1 mg/dL (ref 8.4–10.5)
Creatinine, Ser: 0.86 mg/dL (ref 0.50–1.10)
GFR calc non Af Amer: 62 mL/min — ABNORMAL LOW (ref >90)
GFR, EST AFRICAN AMERICAN: 71 mL/min — AB (ref >90)
Glucose, Bld: 103 mg/dL — ABNORMAL HIGH (ref 70–99)
POTASSIUM: 4.3 meq/L (ref 3.7–5.3)
Sodium: 139 mEq/L (ref 137–147)

## 2014-01-16 LAB — CBC
HEMATOCRIT: 38.2 % (ref 36.0–46.0)
Hemoglobin: 12.3 g/dL (ref 12.0–15.0)
MCH: 30.2 pg (ref 26.0–34.0)
MCHC: 32.2 g/dL (ref 30.0–36.0)
MCV: 93.9 fL (ref 78.0–100.0)
Platelets: 151 10*3/uL (ref 150–400)
RBC: 4.07 MIL/uL (ref 3.87–5.11)
RDW: 14.8 % (ref 11.5–15.5)
WBC: 6.3 10*3/uL (ref 4.0–10.5)

## 2014-01-16 LAB — TROPONIN I
Troponin I: <0.3 ng/mL (ref <0.30)
Troponin I: <0.3 ng/mL (ref <0.30)

## 2014-01-16 MED ORDER — FUROSEMIDE 20 MG PO TABS: 10.0000 mg | ORAL_TABLET | Freq: Every day | ORAL | Status: DC

## 2014-01-16 MED ORDER — ISOSORBIDE MONONITRATE ER 30 MG PO TB24: 30.0000 mg | ORAL_TABLET | Freq: Every evening | ORAL | Status: DC

## 2014-01-16 NOTE — Progress Notes (Signed)
Echo Lab  2D Echocardiogram completed.  [place], RDCS 01/16/2014 11:31 AM

## 2014-01-16 NOTE — Discharge Summary (Addendum)
Discharge Summary   Patient ID: PERSIA LINTNER MRN: [id number], DOB/AGE: 1930/10/15 80 y.o. Admit date: 01/15/2014 D/C date:     01/16/2014  Primary Cardiologist:  Dr. Tamala Julian   Principal Problem:   Left shoulder pain Active Problems:   HTN (hypertension)   Cardiomyopathy, ischemic- 30-35% on admission 10/09/13 --> increased to 40-45% prior to discharge.   Dyslipidemia   PAF- peri-MI. NSR on Amio   H/o Orthostatic hypotension, not on diuretic due to this   CAD S/P percutaneous coronary angioplasty -- PCI to LAD in setting Ant STEMI; 2 overlapping Promx DES 2.5 x 38, 2.5 x 12 (postdilated to ~3 mm)    Admission Dates: 01/15/14 - 01/16/14 Discharge Diagnosis: Non cardiac chest pain and chronic dyspnea   HPI: Ms. Hornbeck is an 80 y/o F with history of CAD s/p STEMI 2014 s/p DESx2 to LAD, cardiogenic shock, peri-MI PAF, ICM last EF 40-45% in 09/2013, and orthostatic hypotension 10/2013 due to Lasix who presented to [facility] yesterday complaining of left shoulder blade pain worrisome for angina and low grade chronic dyspnea.    Hospital Course:  Left shoulder blade pain -On physical exam she pointed a spot on her back that is painful on palpation, she states that in the past it would resolve with massage or physical therapy. She does not have any anterior or SSCP. She does have known CAD s/p STEMI 2014 s/p DESx2 and residual moderate RCA disease. At that time her cath showed 100% mLAD with associated dissection - unclear whether it was spontaneous dissection versus related to the initial predilation balloon angioplasty. She admits that she has anxiety about having another MI after her STEMI last December. She ruled out for MI on this admission with negative cardiac enzymes and stable ECGs. She will continue ASA/Brilinta, statin and BB.   Dyspnea- She has been going to cardiac rehab without chest pain but has unchanged low grade chronic dyspnea. She does have a known ICM with EF 40-45% by  echo 10/16/13. Her BNP was noted to be mildly elevated (1705), but her CXR was non acute. She appears compensated; however she does complain of chronic shortness of breath. A repeat ECHO was done on this admission which revealed a persistent LAD wall motion abnormality, however her EF has improved slightly to 45-50%. Also, there is moderate MR and TR with elevated LV filling pressures. She has been tried on Lasix in the past; however she experienced  orthostatic hypotension and "feeling drained."  It was suggested that she try a low dose lasix (10 - 20 mg daily) with hopes that it may improve some of her shortness of breath without making her feel drained or orthostatic.  HTN- Her blood pressure has been elevated and labile. Imdur 30mg  was added yesterday and BP is now better controlled. She will continue losartan and carvedilol. She is intolerant to ACEI.   PAF peri-MI, on amiodarone and maintaining NSR.  The patient has had an uncomplicated hospital course and is recovering well. She has been seen by Dr. Debara Pickett today and deemed stable for discharge home. All follow-up appointments have been scheduled.  Discharge medications are listed below. Her medications remained the same; however, Imdur and a small dose of Lasix have been added. She is also on a BB at home that was not put on her hospital reconciliation which will also be resumed.   Discharge Vitals: Blood pressure 130/74, pulse 72, temperature 98.1 F (36.7 C), temperature source Oral, resp. rate 20,  height 5\' 6"  (1.676 m), weight 193 lb 4.8 oz (87.68 kg), SpO2 97.00%.  Labs: Lab Results  Component Value Date   WBC 6.3 01/16/2014   HGB 12.3 01/16/2014   HCT 38.2 01/16/2014   MCV 93.9 01/16/2014   PLT 151 01/16/2014     Recent Labs Lab 01/16/14 0238  NA 139  K 4.3  CL 103  CO2 24  BUN 15  CREATININE 0.86  CALCIUM 10.1  GLUCOSE 103*    Recent Labs  01/15/14 2205 01/16/14 0238 01/16/14 0930  TROPONINI <0.30 <0.30 <0.30   Lab  Results  Component Value Date   CHOL 98 10/23/2013   HDL 33* 10/23/2013   LDLCALC 46 10/23/2013   TRIG 130 10/23/2013     Diagnostic Studies/Procedures   Dg Chest 2 View  01/15/2014   CLINICAL DATA:  Pain under left scapula  EXAM: CHEST  2 VIEW  COMPARISON:  11/25/2013  FINDINGS: Cardiac shadow remains enlarged. The lungs are well aerated bilaterally. No focal infiltrate or sizable effusion is seen. No acute bony abnormality is noted. Degenerative change of the thoracic spine is seen.  IMPRESSION: No active cardiopulmonary disease.     2D ECHO: 01/16/2014 ------------------------------ LV EF: 45% - 50% ---------------------------- Study Conclusions - Left ventricle: The cavity size was normal. Wall thickness was normal. Systolic function was mildly reduced. The estimated ejection fraction was in the range of 45% to 50%. Mid to distal anterior and apical hypokinesis. Doppler parameters are consistent with abnormal left ventricular relaxation (grade 1 diastolic dysfunction). The E/e' ratio is >10, suggesting elevated LV filling pressure. - Aortic valve: Trileaflet. Sclerosis without stenosis. No regurgitation. - Mitral valve: Mildly thickened leaflets . Late systolic bi-leaflet prolapse. Mild to moderate central regurgitation. - Left atrium: The atrium was mildly dilated (21 cm2). - Tricuspid valve: Moderate regurgitation. - Pulmonary arteries: PA peak pressure: 36mm Hg (S). - Inferior vena cava: The vessel was normal in size; the respirophasic diameter changes were in the normal range (= 50%); findings are consistent with normal central venous pressure. - Pericardium, extracardiac: There was no pericardial effusion.    Discharge Medications     Medication List         acetaminophen 500 MG tablet  Commonly known as:  TYLENOL  Take 500-1,000 mg by mouth every 6 (six) hours as needed for mild pain.     ALPRAZolam 0.25 MG tablet  Commonly known as:  XANAX  Take 0.25 mg by  mouth 3 (three) times daily as needed for anxiety.     amiodarone 200 MG tablet  Commonly known as:  PACERONE  Take 1 tablet (200 mg total) by mouth daily.     aspirin 81 MG chewable tablet  Chew 1 tablet (81 mg total) by mouth daily.     Biotin 1000 MCG tablet  Take 1,000 mcg by mouth daily.     buPROPion 150 MG 24 hr tablet  Commonly known as:  WELLBUTRIN XL  Take 150 mg by mouth daily.     calcium-vitamin D 500-200 MG-UNIT per tablet  Commonly known as:  OSCAL WITH D  Take 2 tablets by mouth daily.     cholecalciferol 1000 UNITS tablet  Commonly known as:  VITAMIN D  Take 1,000 Units by mouth daily.     furosemide 20 MG tablet  Commonly known as:  LASIX  Take 0.5 tablets (10 mg total) by mouth daily.     isosorbide mononitrate 30 MG 24 hr tablet  Commonly known  as:  IMDUR  Take 1 tablet (30 mg total) by mouth every evening.     levothyroxine 100 MCG tablet  Commonly known as:  SYNTHROID, LEVOTHROID  Take 100 mcg by mouth daily before breakfast.     losartan 50 MG tablet  Commonly known as:  COZAAR  Take 1 tablet (50 mg total) by mouth daily.     multivitamin with minerals tablet  Take 1 tablet by mouth daily.     nitroGLYCERIN 0.4 MG SL tablet  Commonly known as:  NITROSTAT  Place 1 tablet (0.4 mg total) under the tongue every 5 (five) minutes as needed for chest pain.     omeprazole 20 MG capsule  Commonly known as:  PRILOSEC  Take 20 mg by mouth daily as needed.     sertraline 50 MG tablet  Commonly known as:  ZOLOFT  Take 50 mg by mouth daily.     SYSTANE OP  Place 1-2 drops into both eyes 3 (three) times daily as needed (for dry eye or irritation).     Ticagrelor 90 MG Tabs tablet  Commonly known as:  BRILINTA  Take 1 tablet (90 mg total) by mouth 2 (two) times daily.     vitamin E 400 UNIT capsule  Generic drug:  vitamin E  Take 400 Units by mouth daily.        Disposition   The patient will be discharged in stable condition to home.   Future Appointments Provider Department Dept Phone   01/17/2014 11:15 AM [facility] [place] [phone]   01/19/2014 11:15 AM [facility] [place] [phone]   01/22/2014 11:15 AM [facility] [place] [phone]   01/24/2014 11:15 AM [facility] [place] [phone]   01/26/2014 11:15 AM [facility] [place] [phone]   01/29/2014 11:15 AM [facility] [place] [phone]   01/30/2014 11:30 AM Nelida Gores [facility] [phone]   01/31/2014 11:15 AM [facility] [place] [phone]   02/02/2014 11:15 AM [facility] [place] [phone]   02/05/2014 11:15 AM [facility] [place] [phone]   02/07/2014 11:15 AM [facility] [place] [phone]   02/08/2014 10:00 AM Sinclair Grooms, MD [place] [phone]   02/09/2014 11:15 AM [facility] [place] [phone]   02/12/2014 11:15 AM [facility] Monitor [place] [phone]   02/14/2014 11:15 AM [facility] Monitor [place] [phone]   02/16/2014 11:15 AM [facility] [place] [phone]   02/19/2014 11:15 AM [facility] [phone]     Follow-up Information   Follow up with Lyda Jester, PA-C On 01/30/2014. (@11 :30)    Specialty:  Cardiology   Contact information:   [address] [place] [address] [phone]         Duration of Discharge Encounter:  Greater than 30 minutes including physician and PA time.  SignedVertell Limber, Stepheni Cameron PA-C 01/16/2014, 2:39 PM

## 2014-01-16 NOTE — Progress Notes (Signed)
Patient Name: Melinda Noble Date of Encounter: 01/16/2014     Principal Problem:   Left shoulder pain Active Problems:   HTN (hypertension)   Cardiomyopathy, ischemic- 30-35% on admission 10/09/13 --> increased to 40-45% prior to discharge.   Dyslipidemia   PAF- peri-MI. NSR on Amio   H/o Orthostatic hypotension, not on diuretic due to this   CAD S/P percutaneous coronary angioplasty -- PCI to LAD in setting Ant STEMI; 2 overlapping Promx DES 2.5 x 38, 2.5 x 12 (postdilated to ~3 mm)    SUBJECTIVE  Patient feeling good. She is waiting for an ECHO this morning and then ready to go home. She does think this may be MSK due to a knot in her shoulder. No anterior chest pain. She has tylenol extra strength which seems to help. She asks for a note to clear her for cardiac rehab when she leaves  CURRENT MEDS . amiodarone  200 mg Oral Daily  . aspirin  81 mg Oral Daily  . buPROPion  150 mg Oral Daily  . calcium-vitamin D  2 tablet Oral Daily  . cholecalciferol  1,000 Units Oral Daily  . heparin  5,000 Units Subcutaneous 3 times per day  . isosorbide mononitrate  30 mg Oral QPM  . levothyroxine  100 mcg Oral QAC breakfast  . losartan  50 mg Oral Daily  . multivitamin with minerals  1 tablet Oral Daily  . sertraline  50 mg Oral Daily  . sodium chloride  3 mL Intravenous Q12H  . Ticagrelor  90 mg Oral BID    OBJECTIVE  Filed Vitals:   01/15/14 1915 01/15/14 1930 01/15/14 2034 01/16/14 0414  BP: 152/73 152/66 136/60 138/66  Pulse: 58 77 65 61  Temp:   98.6 F (37 C) 98.7 F (37.1 C)  TempSrc:   Oral Oral  Resp: 15 16 18 18   Height:   5\' 6"  (1.676 m)   Weight:   193 lb 4.8 oz (87.68 kg) 193 lb 4.8 oz (87.68 kg)  SpO2: 100% 95% 99% 98%    Intake/Output Summary (Last 24 hours) at 01/16/14 0819 Last data filed at 01/16/14 0414  Gross per 24 hour  Intake      0 ml  Output    700 ml  Net   -700 ml   Filed Weights   01/15/14 1424 01/15/14 2034 01/16/14 0414  Weight: 194 lb  (87.998 kg) 193 lb 4.8 oz (87.68 kg) 193 lb 4.8 oz (87.68 kg)    PHYSICAL EXAM  General: Pleasant, NAD. Neuro: Alert and oriented X 3. Moves all extremities spontaneously. Psych: Normal affect. HEENT:  Normal  Neck: Supple without bruits or JVD. Lungs:  Resp regular and unlabored, CTA. Heart: RRR no s3, s4, or murmurs. Abdomen: Soft, non-tender, non-distended, BS + x 4.  Extremities: No clubbing, cyanosis or edema. DP/PT/Radials 2+ and equal bilaterally.  Accessory Clinical Findings  CBC  Recent Labs  01/15/14 1427 01/16/14 0238  WBC 7.5 6.3  HGB 13.4 12.3  HCT 40.7 38.2  MCV 93.6 93.9  PLT 167 161   Basic Metabolic Panel  Recent Labs  01/15/14 1427 01/16/14 0238  NA 140 139  K 4.1 4.3  CL 103 103  CO2 24 24  GLUCOSE 97 103*  BUN 13 15  CREATININE 0.78 0.86  CALCIUM 10.5 10.1    Cardiac Enzymes  Recent Labs  01/15/14 2205 01/16/14 0238  TROPONINI <0.30 <0.30   TELE  NSR HR in 50s  Radiology/Studies  Dg Chest 2 View  01/15/2014   CLINICAL DATA:  Pain under left scapula  EXAM: CHEST  2 VIEW  COMPARISON:  11/25/2013  FINDINGS: Cardiac shadow remains enlarged. The lungs are well aerated bilaterally. No focal infiltrate or sizable effusion is seen. No acute bony abnormality is noted. Degenerative change of the thoracic spine is seen.  IMPRESSION: No active cardiopulmonary disease.      ASSESSMENT AND PLAN Melinda Noble is an 80 y/o F with history of CAD s/p STEMI 09/2013 c/b VDRF, cardiogenic shock, peri-MI PAF, (ICM last EF 40-45% in 09/2013 several days after STEMI), orthostatic hypotension 10/2013 due to Lasix who presented to [facility] yesterday with left shoulder blade pain.  Left shoulder blade pain -- On physical exam she point a spot on her back that is painful on palpation, she states that in the past it would resolve with massage or physical therapy.   CAD- s/p STEMI 2014 s/p DESx2, residual moderate RCA disease -- Troponin neg x 2; EKG  with no acute ST or TW changes -- Continue ASA/Brilinta  ICM EF 40-45% by echo 10/16/13. BNP mildly elevated and CXR non acute. Currently appears compensated  -- Pending repeat ECHO that can be done as an outpatient per Dr. Meda Coffee  HTN- has been elevated and labile : imdur 30mg  added yesterday and now better controlled. Continue losartan (intolerance of BB and ACEI).  PAF per-MI, on amiodarone, maintaining NSR    Signed, Perry Mount PA-C  Pager [phone]

## 2014-01-16 NOTE — Progress Notes (Signed)
Pt. Seen and examined. Agree with the NP/PA-C note as written. See my note.  Pixie Casino, MD, [facility] Attending Cardiologist [place]

## 2014-01-16 NOTE — Progress Notes (Addendum)
DAILY PROGRESS NOTE  Subjective:  No events overnight. Shoulder blade pain is improving. No SSCP.  BNP elevated, but no S/S of CHF. Cardiac enzymes are negative overnight.  Objective:  Temp:  [98.6 F (37 C)-98.7 F (37.1 C)] 98.7 F (37.1 C) (03/31 0414) Pulse Rate:  [58-77] 61 (03/31 0414) Resp:  [11-22] 18 (03/31 0414) BP: (136-192)/(60-80) 138/66 mmHg (03/31 0414) SpO2:  [95 %-100 %] 98 % (03/31 0414) Weight:  [193 lb 4.8 oz (87.68 kg)-194 lb (87.998 kg)] 193 lb 4.8 oz (87.68 kg) (03/31 0414) Weight change:   Intake/Output from previous day: 03/30 0701 - 03/31 0700 In: -  Out: 700 [Urine:700]  Intake/Output from this shift:    Medications: Current Facility-Administered Medications  Medication Dose Route Frequency Provider Last Rate Last Dose  . 0.9 %  sodium chloride infusion  250 mL Intravenous PRN Dayna N Dunn, PA-C      . acetaminophen (TYLENOL) tablet 650 mg  650 mg Oral Q6H PRN Dayna N Dunn, PA-C   650 mg at 01/16/14 0437  . ALPRAZolam Duanne Moron) tablet 0.25 mg  0.25 mg Oral TID PRN Charlie Pitter, PA-C   0.25 mg at 01/15/14 2252  . amiodarone (PACERONE) tablet 200 mg  200 mg Oral Daily Dayna N Dunn, PA-C      . aspirin chewable tablet 81 mg  81 mg Oral Daily Dayna N Dunn, PA-C      . buPROPion (WELLBUTRIN XL) 24 hr tablet 150 mg  150 mg Oral Daily Dayna N Dunn, PA-C      . calcium-vitamin D (OSCAL WITH D) 500-200 MG-UNIT per tablet 2 tablet  2 tablet Oral Daily Dayna N Dunn, PA-C      . cholecalciferol (VITAMIN D) tablet 1,000 Units  1,000 Units Oral Daily Dayna N Dunn, PA-C   1,000 Units at 01/15/14 2252  . heparin injection 5,000 Units  5,000 Units Subcutaneous 3 times per day Dayna N Dunn, PA-C      . isosorbide mononitrate (IMDUR) 24 hr tablet 30 mg  30 mg Oral QPM Dayna N Dunn, PA-C   30 mg at 01/15/14 2252  . levothyroxine (SYNTHROID, LEVOTHROID) tablet 100 mcg  100 mcg Oral QAC breakfast Dayna N Dunn, PA-C   100 mcg at 01/16/14 [phone]  . losartan (COZAAR)  tablet 50 mg  50 mg Oral Daily Dayna N Dunn, PA-C      . multivitamin with minerals tablet 1 tablet  1 tablet Oral Daily Dayna N Dunn, PA-C      . nitroGLYCERIN (NITROSTAT) SL tablet 0.4 mg  0.4 mg Sublingual Q5 min PRN Dayna N Dunn, PA-C      . pantoprazole (PROTONIX) EC tablet 40 mg  40 mg Oral Daily PRN Dayna N Dunn, PA-C      . polyvinyl alcohol (LIQUIFILM TEARS) 1.4 % ophthalmic solution 1 drop  1 drop Both Eyes TID PRN Dayna N Dunn, PA-C      . sertraline (ZOLOFT) tablet 50 mg  50 mg Oral Daily Dayna N Dunn, PA-C   50 mg at 01/15/14 2253  . sodium chloride 0.9 % injection 3 mL  3 mL Intravenous Q12H Dayna N Dunn, PA-C   3 mL at 01/15/14 2253  . sodium chloride 0.9 % injection 3 mL  3 mL Intravenous PRN Dayna N Dunn, PA-C      . Ticagrelor (BRILINTA) tablet 90 mg  90 mg Oral BID Dayna N Dunn, PA-C   90 mg at 01/15/14 2252  Facility-Administered Medications Ordered in Other Encounters  Medication Dose Route Frequency Provider Last Rate Last Dose  . etomidate (AMIDATE) injection    Anesthesia Intra-op [place]on Reek, MD   14 mg at 10/10/13 0105  . rocuronium [facility]) injection    Anesthesia Intra-op Fulton Reek, MD   50 mg at 10/10/13 0110  . succinylcholine (ANECTINE) injection    Anesthesia Intra-op F[place]on Reek, MD   100 mg at 10/10/13 0105    Physical Exam: General appearance: alert and no distress Neck: no carotid bruit and no JVD Lungs: clear to auscultation bilaterally Heart: regular rate and rhythm Extremities: extremities normal, atraumatic, no cyanosis or edema  Lab Results: Results for orders placed during the hospital encounter of 01/15/14 (from the past 48 hour(s))  CBC     Status: None   Collection Time    01/15/14  2:27 PM      Result Value Ref Range   WBC 7.5  4.0 - 10.5 K/uL   RBC 4.35  3.87 - 5.11 MIL/uL   Hemoglobin 13.4  12.0 - 15.0 g/dL   HCT 40.7  36.0 - 46.0 %   MCV 93.6  78.0 - 100.0 fL   MCH 30.8  26.0 - 34.0 pg   MCHC 32.9  30.0 - 36.0  g/dL   RDW 14.7  11.5 - 15.5 %   Platelets 167  150 - 400 K/uL  BASIC METABOLIC PANEL     Status: Abnormal   Collection Time    01/15/14  2:27 PM      Result Value Ref Range   Sodium 140  137 - 147 mEq/L   Potassium 4.1  3.7 - 5.3 mEq/L   Chloride 103  96 - 112 mEq/L   CO2 24  19 - 32 mEq/L   Glucose, Bld 97  70 - 99 mg/dL   BUN 13  6 - 23 mg/dL   Creatinine, Ser 0.78  0.50 - 1.10 mg/dL   Calcium 10.5  8.4 - 10.5 mg/dL   GFR calc non Af Amer 76 (*) >90 mL/min   GFR calc Af Amer 88 (*) >90 mL/min   Comment: (NOTE)     The eGFR has been calculated using the CKD EPI equation.     This calculation has not been validated in all clinical situations.     eGFR's persistently <90 mL/min signify possible Chronic Kidney     Disease.  PRO B NATRIURETIC PEPTIDE     Status: Abnormal   Collection Time    01/15/14  2:27 PM      Result Value Ref Range   Pro B Natriuretic peptide (BNP) 1705.0 (*) 0 - 450 pg/mL  I-STAT TROPOININ, ED     Status: None   Collection Time    01/15/14  4:18 PM      Result Value Ref Range   Troponin i, poc 0.05  0.00 - 0.08 ng/mL   Comment 3            Comment: Due to the release kinetics of cTnI,     a negative result within the first hours     of the onset of symptoms does not rule out     myocardial infarction with certainty.     If myocardial infarction is still suspected,     repeat the test at appropriate intervals.  TROPONIN I     Status: None   Collection Time    01/15/14 10:05 PM      Result Value  Ref Range   Troponin I <0.30  <0.30 ng/mL   Comment:            Due to the release kinetics of cTnI,     a negative result within the first hours     of the onset of symptoms does not rule out     myocardial infarction with certainty.     If myocardial infarction is still suspected,     repeat the test at appropriate intervals.  TROPONIN I     Status: None   Collection Time    01/16/14  2:38 AM      Result Value Ref Range   Troponin I <0.30  <0.30  ng/mL   Comment:            Due to the release kinetics of cTnI,     a negative result within the first hours     of the onset of symptoms does not rule out     myocardial infarction with certainty.     If myocardial infarction is still suspected,     repeat the test at appropriate intervals.  CBC     Status: None   Collection Time    01/16/14  2:38 AM      Result Value Ref Range   WBC 6.3  4.0 - 10.5 K/uL   RBC 4.07  3.87 - 5.11 MIL/uL   Hemoglobin 12.3  12.0 - 15.0 g/dL   HCT 38.2  36.0 - 46.0 %   MCV 93.9  78.0 - 100.0 fL   MCH 30.2  26.0 - 34.0 pg   MCHC 32.2  30.0 - 36.0 g/dL   RDW 14.8  11.5 - 15.5 %   Platelets 151  150 - 400 K/uL  BASIC METABOLIC PANEL     Status: Abnormal   Collection Time    01/16/14  2:38 AM      Result Value Ref Range   Sodium 139  137 - 147 mEq/L   Potassium 4.3  3.7 - 5.3 mEq/L   Chloride 103  96 - 112 mEq/L   CO2 24  19 - 32 mEq/L   Glucose, Bld 103 (*) 70 - 99 mg/dL   BUN 15  6 - 23 mg/dL   Creatinine, Ser 0.86  0.50 - 1.10 mg/dL   Calcium 10.1  8.4 - 10.5 mg/dL   GFR calc non Af Amer 62 (*) >90 mL/min   GFR calc Af Amer 71 (*) >90 mL/min   Comment: (NOTE)     The eGFR has been calculated using the CKD EPI equation.     This calculation has not been validated in all clinical situations.     eGFR's persistently <90 mL/min signify possible Chronic Kidney     Disease.    Imaging: Dg Chest 2 View  01/15/2014   CLINICAL DATA:  Pain under left scapula  EXAM: CHEST  2 VIEW  COMPARISON:  11/25/2013  FINDINGS: Cardiac shadow remains enlarged. The lungs are well aerated bilaterally. No focal infiltrate or sizable effusion is seen. No acute bony abnormality is noted. Degenerative change of the thoracic spine is seen.  IMPRESSION: No active cardiopulmonary disease.   Electronically Signed   By: Inez Catalina M.D.   On: 01/15/2014 15:53    Assessment:  1. Principal Problem: 2.   Left shoulder pain 3. Active Problems: 4.   HTN (hypertension) 5.    Cardiomyopathy, ischemic- 30-35% on admission 10/09/13 --> increased to 40-45% prior to  discharge. 6.   Dyslipidemia 7.   PAF- peri-MI. NSR on Amio 8.   H/o Orthostatic hypotension, not on diuretic due to this 9.   CAD S/P percutaneous coronary angioplasty -- PCI to LAD in setting Ant STEMI; 2 overlapping Promx DES 2.5 x 38, 2.5 x 12 (postdilated to ~3 mm) 10.   Plan:  1. Check 2D echo today to evaluate for change in EF given elevated BNP. Cardiac markers negative. Symptoms are atypical for ACS. If echo is stable, probably okay for discharge. She has been orthostatic in the past with lasix - feels drained, although there is suggestion of some congestive heart failure.  Time Spent Directly with Patient:  15 minutes  Length of Stay:  LOS: 1 day   Pixie Casino, MD, [facility] Attending Cardiologist [facility]  Park Beck C 01/16/2014, 8:45 AM

## 2014-01-16 NOTE — Discharge Instructions (Signed)
Chest Pain (Nonspecific) °It is often hard to give a specific diagnosis for the cause of chest pain. There is always a chance that your pain could be related to something serious, such as a heart attack or a blood clot in the lungs. You need to follow up with your caregiver for further evaluation. °CAUSES  °· Heartburn. °· Pneumonia or bronchitis. °· Anxiety or stress. °· Inflammation around your heart (pericarditis) or lung (pleuritis or pleurisy). °· A blood clot in the lung. °· A collapsed lung (pneumothorax). It can develop suddenly on its own (spontaneous pneumothorax) or from injury (trauma) to the chest. °· Shingles infection (herpes zoster virus). °The chest wall is composed of bones, muscles, and cartilage. Any of these can be the source of the pain. °· The bones can be bruised by injury. °· The muscles or cartilage can be strained by coughing or overwork. °· The cartilage can be affected by inflammation and become sore (costochondritis). °DIAGNOSIS  °Lab tests or other studies, such as X-rays, electrocardiography, stress testing, or cardiac imaging, may be needed to find the cause of your pain.  °TREATMENT  °· Treatment depends on what may be causing your chest pain. Treatment may include: °· Acid blockers for heartburn. °· Anti-inflammatory medicine. °· Pain medicine for inflammatory conditions. °· Antibiotics if an infection is present. °· You may be advised to change lifestyle habits. This includes stopping smoking and avoiding alcohol, caffeine, and chocolate. °· You may be advised to keep your head raised (elevated) when sleeping. This reduces the chance of acid going backward from your stomach into your esophagus. °· Most of the time, nonspecific chest pain will improve within 2 to 3 days with rest and mild pain medicine. °HOME CARE INSTRUCTIONS  °· If antibiotics were prescribed, take your antibiotics as directed. Finish them even if you start to feel better. °· For the next few days, avoid physical  activities that bring on chest pain. Continue physical activities as directed. °· Do not smoke. °· Avoid drinking alcohol. °· Only take over-the-counter or prescription medicine for pain, discomfort, or fever as directed by your caregiver. °· Follow your caregiver's suggestions for further testing if your chest pain does not go away. °· Keep any follow-up appointments you made. If you do not go to an appointment, you could develop lasting (chronic) problems with pain. If there is any problem keeping an appointment, you must call to reschedule. °SEEK MEDICAL CARE IF:  °· You think you are having problems from the medicine you are taking. Read your medicine instructions carefully. °· Your chest pain does not go away, even after treatment. °· You develop a rash with blisters on your chest. °SEEK IMMEDIATE MEDICAL CARE IF:  °· You have increased chest pain or pain that spreads to your arm, neck, jaw, back, or abdomen. °· You develop shortness of breath, an increasing cough, or you are coughing up blood. °· You have severe back or abdominal pain, feel nauseous, or vomit. °· You develop severe weakness, fainting, or chills. °· You have a fever. °THIS IS AN EMERGENCY. Do not wait to see if the pain will go away. Get medical help at once. Call your local emergency services (911 in [place] Do not drive yourself to the hospital. °MAKE SURE YOU:  °· Understand these instructions. °· Will watch your condition. °· Will get help right away if you are not doing well or get worse. °Document Released: 07/15/2005 Document Revised: 12/28/2011 Document Reviewed: 05/10/2008 °[employer]® Patient Information ©2014 [employer]   [employer]. ° °

## 2014-01-16 NOTE — Progress Notes (Signed)
I have reviewed her echo - there is still an LAD wall motion abnormality, however EF is improved slightly. There is moderate MR and TR with elevated LV filling pressures.  I would suggest that she try low dose lasix 10 - 20 mg daily to see if it improves some of her shortness of breath without making her feel drained. [place] for discharge today. Follow-up with Dr. Ellyn Hack.  Melinda Casino, MD, [facility] Attending Cardiologist [place]

## 2014-01-17 ENCOUNTER — Telehealth (HOSPITAL_COMMUNITY): Payer: Self-pay | Admitting: Internal Medicine

## 2014-01-17 ENCOUNTER — Telehealth: Payer: Self-pay | Admitting: Cardiology

## 2014-01-17 ENCOUNTER — Encounter (HOSPITAL_COMMUNITY): Admission: RE | Admit: 2014-01-17 | Payer: Medicare Other | Source: Ambulatory Visit

## 2014-01-17 NOTE — Telephone Encounter (Signed)
Pt's husband called- his wife developed a severe headache after taking Imdur.  I asked her to stop imdur, the pain was thought to be muscular skeletal that she was admitted with.  I will call in Fioricet to [facility] as tylenol is not helping.  1 every 4 hours prn.  # 30 no refills.  If headache does not improve she will go to PCP or urgent care.

## 2014-01-18 DIAGNOSIS — M545 Low back pain, unspecified: Secondary | ICD-10-CM | POA: Diagnosis not present

## 2014-01-19 ENCOUNTER — Encounter (HOSPITAL_COMMUNITY)
Admission: RE | Admit: 2014-01-19 | Discharge: 2014-01-19 | Disposition: A | Discharge status: Home / Self Care | Payer: Medicare Other | Source: Ambulatory Visit | Attending: Cardiology | Admitting: Cardiology

## 2014-01-19 ENCOUNTER — Telehealth (HOSPITAL_COMMUNITY): Payer: Self-pay | Admitting: *Deleted

## 2014-01-19 DIAGNOSIS — I252 Old myocardial infarction: Secondary | ICD-10-CM | POA: Insufficient documentation

## 2014-01-19 DIAGNOSIS — Z5189 Encounter for other specified aftercare: Secondary | ICD-10-CM | POA: Diagnosis not present

## 2014-01-19 NOTE — Progress Notes (Signed)
Pt returned to rehab with clearance from MD note.  Noted posted in chart.  Pt tolerated light exercise with no complaints.  Pt unable to continue to take IMDUR due to side effect of a headache.  IMDUR was d/cd.  Pt seen by PT who was able to massage a lump noted on her back.  Pt denied any further discomfort. Cherre Huger, BSN

## 2014-01-19 NOTE — Telephone Encounter (Signed)
Message copied by Rowe Pavy on Fri Jan 19, 2014 10:02 AM ------      Message from: Dondra Spry      Created: Fri Jan 19, 2014  9:54 AM      Regarding: RE: ok to return to cardiac rehab       I gave her  Physical note on a Rx pad that okayed her for cardiac rehab. I should have put in discharge note. Sorry about that. Thanks!      Katie      ----- Message -----         From: Rowe Pavy, RN         Sent: 01/19/2014   8:38 AM           To: Perry Mount, PA-C      Subject: ok to return to cardiac rehab                                  Pt seen inpatient by you after brief hospitalization - d/c'd on 3/31.  Sent in basket to Dr. Debara Pickett and fax to the office with no response from him(he may be on vacation?).  There is a mention that pt requested a note clearance to return back to rehab but the progress note does not indicate if one was written nor was it part of the d/c summary.      May pt resume exercise at cardiac rehab, pt has completed 26 out of 36 sessions thus far.            Thanks so much             Psychologist, clinical       ------

## 2014-01-22 ENCOUNTER — Encounter (HOSPITAL_COMMUNITY)
Admission: RE | Admit: 2014-01-22 | Discharge: 2014-01-22 | Disposition: A | Discharge status: Home / Self Care | Payer: Medicare Other | Source: Ambulatory Visit | Attending: Cardiology | Admitting: Cardiology

## 2014-01-22 DIAGNOSIS — I252 Old myocardial infarction: Secondary | ICD-10-CM | POA: Diagnosis not present

## 2014-01-22 DIAGNOSIS — Z5189 Encounter for other specified aftercare: Secondary | ICD-10-CM | POA: Diagnosis not present

## 2014-01-23 DIAGNOSIS — M545 Low back pain, unspecified: Secondary | ICD-10-CM | POA: Diagnosis not present

## 2014-01-24 ENCOUNTER — Encounter (HOSPITAL_COMMUNITY)
Admission: RE | Admit: 2014-01-24 | Discharge: 2014-01-24 | Disposition: A | Discharge status: Home / Self Care | Payer: Medicare Other | Source: Ambulatory Visit | Attending: Cardiology | Admitting: Cardiology

## 2014-01-24 DIAGNOSIS — Z5189 Encounter for other specified aftercare: Secondary | ICD-10-CM | POA: Diagnosis not present

## 2014-01-24 DIAGNOSIS — I252 Old myocardial infarction: Secondary | ICD-10-CM | POA: Diagnosis not present

## 2014-01-26 ENCOUNTER — Encounter (HOSPITAL_COMMUNITY)
Admission: RE | Admit: 2014-01-26 | Discharge: 2014-01-26 | Disposition: A | Discharge status: Home / Self Care | Payer: Medicare Other | Source: Ambulatory Visit | Attending: Cardiology | Admitting: Cardiology

## 2014-01-26 DIAGNOSIS — Z5189 Encounter for other specified aftercare: Secondary | ICD-10-CM | POA: Diagnosis not present

## 2014-01-26 DIAGNOSIS — I252 Old myocardial infarction: Secondary | ICD-10-CM | POA: Diagnosis not present

## 2014-01-29 ENCOUNTER — Encounter (HOSPITAL_COMMUNITY)
Admission: RE | Admit: 2014-01-29 | Discharge: 2014-01-29 | Disposition: A | Discharge status: Home / Self Care | Payer: Medicare Other | Source: Ambulatory Visit | Attending: Cardiology | Admitting: Cardiology

## 2014-01-29 DIAGNOSIS — Z5189 Encounter for other specified aftercare: Secondary | ICD-10-CM | POA: Diagnosis not present

## 2014-01-29 DIAGNOSIS — I252 Old myocardial infarction: Secondary | ICD-10-CM | POA: Diagnosis not present

## 2014-01-30 ENCOUNTER — Ambulatory Visit (INDEPENDENT_AMBULATORY_CARE_PROVIDER_SITE_OTHER): Payer: Medicare Other | Admitting: Cardiology

## 2014-01-30 VITALS — BP 130/90 | Ht 66.0 in | Wt 192.0 lb

## 2014-01-30 DIAGNOSIS — I2510 Atherosclerotic heart disease of native coronary artery without angina pectoris: Secondary | ICD-10-CM

## 2014-01-30 DIAGNOSIS — I10 Essential (primary) hypertension: Secondary | ICD-10-CM

## 2014-01-30 DIAGNOSIS — I480 Paroxysmal atrial fibrillation: Secondary | ICD-10-CM

## 2014-01-30 DIAGNOSIS — Z79899 Other long term (current) drug therapy: Secondary | ICD-10-CM | POA: Diagnosis not present

## 2014-01-30 DIAGNOSIS — Z9861 Coronary angioplasty status: Secondary | ICD-10-CM

## 2014-01-30 DIAGNOSIS — I4891 Unspecified atrial fibrillation: Secondary | ICD-10-CM

## 2014-01-30 DIAGNOSIS — M25519 Pain in unspecified shoulder: Secondary | ICD-10-CM

## 2014-01-30 DIAGNOSIS — M549 Dorsalgia, unspecified: Secondary | ICD-10-CM | POA: Diagnosis not present

## 2014-01-30 DIAGNOSIS — M25512 Pain in left shoulder: Secondary | ICD-10-CM

## 2014-01-30 DIAGNOSIS — E785 Hyperlipidemia, unspecified: Secondary | ICD-10-CM

## 2014-01-30 DIAGNOSIS — E7800 Pure hypercholesterolemia, unspecified: Secondary | ICD-10-CM | POA: Diagnosis not present

## 2014-01-30 DIAGNOSIS — R040 Epistaxis: Secondary | ICD-10-CM | POA: Diagnosis not present

## 2014-01-30 NOTE — Patient Instructions (Signed)
Your physician recommends that you schedule a follow-up appointment in:  3 months with Dr. Gwenlyn Found  Continue your same meds as you are taking them now  Call us sooner if have any problems and want to be seen earlier or if you have any questions

## 2014-01-31 ENCOUNTER — Encounter (HOSPITAL_COMMUNITY)
Admission: RE | Admit: 2014-01-31 | Discharge: 2014-01-31 | Disposition: A | Discharge status: Home / Self Care | Payer: Medicare Other | Source: Ambulatory Visit | Attending: Cardiology | Admitting: Cardiology

## 2014-01-31 DIAGNOSIS — I252 Old myocardial infarction: Secondary | ICD-10-CM | POA: Diagnosis not present

## 2014-01-31 DIAGNOSIS — Z5189 Encounter for other specified aftercare: Secondary | ICD-10-CM | POA: Diagnosis not present

## 2014-02-02 ENCOUNTER — Encounter (HOSPITAL_COMMUNITY)
Admission: RE | Admit: 2014-02-02 | Discharge: 2014-02-02 | Disposition: A | Discharge status: Home / Self Care | Payer: Medicare Other | Source: Ambulatory Visit | Attending: Cardiology | Admitting: Cardiology

## 2014-02-02 DIAGNOSIS — I252 Old myocardial infarction: Secondary | ICD-10-CM | POA: Diagnosis not present

## 2014-02-02 DIAGNOSIS — Z5189 Encounter for other specified aftercare: Secondary | ICD-10-CM | POA: Diagnosis not present

## 2014-02-04 ENCOUNTER — Encounter: Payer: Self-pay | Admitting: Cardiology

## 2014-02-04 NOTE — Assessment & Plan Note (Addendum)
Stable. Denies anginal pain. Continue ASA, Brilinta, BB, ARB, Imdur and statin.

## 2014-02-04 NOTE — Assessment & Plan Note (Signed)
Continue statin. 

## 2014-02-04 NOTE — Progress Notes (Signed)
Patient ID: Melinda Noble, female   DOB: [birth year], 80 y.o.   MRN: [id number]    02/04/2014 Melinda Noble   Dec 05, 1929  [id number]  Primary Physicia Melinda NEVILL, MD Primary Cardiologist: Dr. Tamala Noble  HPI:  Ms. Melinda Noble is an 80 y/o female with history of CAD, s/p STEMI 2014 s/p DESx2 to LAD, cardiogenic shock, peri-MI PAF, ICM last EF 40-45% in 09/2013, and orthostatic hypotension 10/2013 due to Lasix who presented to [facility] on 01/15/14 complaining of left shoulder blade pain and low grade chronic dyspnea. Given her history, she was admitted for w/u and observation . She ruled out for MI with negative troponins x 3.  Her shoulder pain was felt to be musculoskeletal, given that her pain was reproducible with palpation. Her BNP was mildly elevated at 1705, but her CXR was non acute. A repeat ECHO revealed a persistent LAD wall motion abnormality, however her EF had improved slightly to 45-50%. After work-up was complete, it was felt that she was stable for discharge home. Since she experienced orthostatic hypotension on Lasix in the past, it was suggested that she try low maintainence dose Lasix, 10 mg daily. All of her other meds were continued.   She presents to clinic today for f/u. She is accompanied by her husband. She states that she has been doing farily well post discharge. Her left shoulder/ scapular pain has resolved. She denies chest pain. Her breathing has improved. She states that she takes her lasix every other day. She denies any hypotension. She denies weight gain and LEE. She reports daily compliance with all of her medications.      Current Outpatient Prescriptions  Medication Sig Dispense Refill  . acetaminophen (TYLENOL) 500 MG tablet Take 500-1,000 mg by mouth every 6 (six) hours as needed for mild pain.      Marland Kitchen ALPRAZolam (XANAX) 0.25 MG tablet Take 0.25 mg by mouth 3 (three) times daily as needed for anxiety.      Marland Kitchen amiodarone (PACERONE) 200 MG tablet Take 1 tablet  (200 mg total) by mouth daily.  30 tablet  0  . aspirin 81 MG chewable tablet Chew 1 tablet (81 mg total) by mouth daily.      . Biotin 1000 MCG tablet Take 1,000 mcg by mouth daily.      Marland Kitchen buPROPion (WELLBUTRIN XL) 150 MG 24 hr tablet Take 150 mg by mouth daily.      . calcium-vitamin D (OSCAL WITH D) 500-200 MG-UNIT per tablet Take 2 tablets by mouth daily.      . carvedilol (COREG) 6.25 MG tablet Take 6.25 mg by mouth 2 (two) times daily with a meal.      . cholecalciferol (VITAMIN D) 1000 UNITS tablet Take 1,000 Units by mouth daily.      . furosemide (LASIX) 20 MG tablet Take 20 mg by mouth daily. 1/2 tablet m/w/f      . levothyroxine (SYNTHROID, LEVOTHROID) 100 MCG tablet Take 100 mcg by mouth daily before breakfast.      . losartan (COZAAR) 50 MG tablet Take 1 tablet (50 mg total) by mouth daily.  30 tablet  5  . Multiple Vitamins-Minerals (MULTIVITAMIN WITH MINERALS) tablet Take 1 tablet by mouth daily.      . nitroGLYCERIN (NITROSTAT) 0.4 MG SL tablet Place 1 tablet (0.4 mg total) under the tongue every 5 (five) minutes as needed for chest pain.  25 tablet  6  . omeprazole (PRILOSEC) 20 MG capsule Take 20 mg  by mouth daily as needed.      Melinda Noble Glycol-Propyl Glycol (SYSTANE OP) Place 1-2 drops into both eyes 3 (three) times daily as needed (for dry eye or irritation).      . rosuvastatin (CRESTOR) 5 MG tablet Take 5 mg by mouth daily.      . sertraline (ZOLOFT) 50 MG tablet Take 50 mg by mouth daily.      . Ticagrelor (BRILINTA) 90 MG TABS tablet Take 1 tablet (90 mg total) by mouth 2 (two) times daily.  60 tablet  5  . vitamin E (VITAMIN E) 400 UNIT capsule Take 400 Units by mouth daily.       No current facility-administered medications for this visit.   Facility-Administered Medications Ordered in Other Visits  Medication Dose Route Frequency Provider Last Rate Last Dose  . etomidate (AMIDATE) injection    Anesthesia Intra-op Fulton Reek, MD   14 mg at 10/10/13 0105  .  rocuronium [facility]) injection    Anesthesia Intra-op Fulton Reek, MD   50 mg at 10/10/13 0110  . succinylcholine (ANECTINE) injection    Anesthesia Intra-op Fulton Reek, MD   100 mg at 10/10/13 0105    Allergies  Allergen Reactions  . Ace Inhibitors Anaphylaxis  . Beta Adrenergic Blockers Anaphylaxis  . Iohexol Anaphylaxis     Code: HIVES, Desc: anaphylactic shock s/p contrast injection many yrs ago--suggested that pt NEVER have iv contrast//a.c., Onset Date: [id number]   . Atorvastatin Rash  . Latex Rash  . Penicillins Swelling and Rash  . Sulfa Antibiotics Rash    History   Social History  . Marital Status: Married    Spouse Name: N/A    Number of Children: N/A  . Years of Education: N/A   Occupational History  . Not on file.   Social History Main Topics  . Smoking status: Former Research scientist (life sciences)  . Smokeless tobacco: Not on file  . Alcohol Use: No  . Drug Use: No  . Sexual Activity: Not on file   Other Topics Concern  . Not on file   Social History Narrative   Patient is Married, since 1958 [name])   Lives in apartment, Independent Living section at [facility] since 04/2013.   Stopped Smoking many years ago, minimal aalcohol history.   Regular exercise includes swimming 3d/week,   Patient has no Advanced planning documents     Review of Systems: General: negative for chills, fever, night sweats or weight changes.  Cardiovascular: negative for chest pain, dyspnea on exertion, edema, orthopnea, palpitations, paroxysmal nocturnal dyspnea or shortness of breath Dermatological: negative for rash Respiratory: negative for cough or wheezing Urologic: negative for hematuria Abdominal: negative for nausea, vomiting, diarrhea, bright red blood per rectum, melena, or hematemesis Neurologic: negative for visual changes, syncope, or dizziness All other systems reviewed and are otherwise negative except as noted above.    Blood pressure 130/90,  height 5\' 6"  (1.676 m), weight 192 lb (87.091 kg).  General appearance: alert, cooperative and no distress Neck: no carotid bruit and no JVD Lungs: clear to auscultation bilaterally Heart: regular rate and rhythm, S1, S2 normal, no murmur, click, rub or gallop Extremities: no LEE Pulses: 2+ and symmetric Skin: warm and dry Neurologic: Grossly normal  EKG NSR HR 62 bpm   ASSESSMENT AND PLAN:   Left shoulder pain Improved. Determined to be musculoskeletal pain.  CAD S/P percutaneous coronary angioplasty -- PCI to LAD in setting Ant STEMI; 2 overlapping Promx DES 2.5 x 38,  2.5 x 12 (postdilated to ~3 mm) Stable. Denies anginal pain. Continue ASA, Brilinta, BB, ARB, Imdur and statin.   PAF- peri-MI. NSR on Amio EKG shows NSR. Continue amiodarone.   Dyslipidemia Continue statin.   HTN (hypertension) Stable. Continue current meds.      PLAN  Pt appears to be doing well post dishcarge and is stable from a cardiac standpoint. No medication adjustments neded. Continue current meds as prescribed. She is scheduled to f/u with Dr. Tamala Noble on 02/08/14.   Nolyn Eilert SimmonsPA-C 02/04/2014 11:37 PM

## 2014-02-04 NOTE — Assessment & Plan Note (Signed)
Improved. Determined to be musculoskeletal pain.

## 2014-02-04 NOTE — Assessment & Plan Note (Signed)
Stable. Continue current meds.   

## 2014-02-04 NOTE — Assessment & Plan Note (Signed)
EKG shows NSR. Continue amiodarone.

## 2014-02-05 ENCOUNTER — Encounter (HOSPITAL_COMMUNITY)
Admission: RE | Admit: 2014-02-05 | Discharge: 2014-02-05 | Disposition: A | Discharge status: Home / Self Care | Payer: Medicare Other | Source: Ambulatory Visit | Attending: Cardiology | Admitting: Cardiology

## 2014-02-05 DIAGNOSIS — I252 Old myocardial infarction: Secondary | ICD-10-CM | POA: Diagnosis not present

## 2014-02-05 DIAGNOSIS — Z5189 Encounter for other specified aftercare: Secondary | ICD-10-CM | POA: Diagnosis not present

## 2014-02-07 ENCOUNTER — Encounter (HOSPITAL_COMMUNITY)
Admission: RE | Admit: 2014-02-07 | Discharge: 2014-02-07 | Disposition: A | Discharge status: Home / Self Care | Payer: Medicare Other | Source: Ambulatory Visit | Attending: Cardiology | Admitting: Cardiology

## 2014-02-07 DIAGNOSIS — I252 Old myocardial infarction: Secondary | ICD-10-CM | POA: Diagnosis not present

## 2014-02-07 DIAGNOSIS — Z5189 Encounter for other specified aftercare: Secondary | ICD-10-CM | POA: Diagnosis not present

## 2014-02-08 ENCOUNTER — Ambulatory Visit (INDEPENDENT_AMBULATORY_CARE_PROVIDER_SITE_OTHER): Payer: Medicare Other | Admitting: Interventional Cardiology

## 2014-02-08 ENCOUNTER — Encounter: Payer: Self-pay | Admitting: Interventional Cardiology

## 2014-02-08 VITALS — BP 162/86 | HR 55 | Ht 66.0 in | Wt 195.0 lb

## 2014-02-08 DIAGNOSIS — I4891 Unspecified atrial fibrillation: Secondary | ICD-10-CM

## 2014-02-08 DIAGNOSIS — Z9861 Coronary angioplasty status: Secondary | ICD-10-CM | POA: Diagnosis not present

## 2014-02-08 DIAGNOSIS — I5022 Chronic systolic (congestive) heart failure: Secondary | ICD-10-CM | POA: Diagnosis not present

## 2014-02-08 DIAGNOSIS — I2510 Atherosclerotic heart disease of native coronary artery without angina pectoris: Secondary | ICD-10-CM | POA: Diagnosis not present

## 2014-02-08 DIAGNOSIS — I10 Essential (primary) hypertension: Secondary | ICD-10-CM

## 2014-02-08 DIAGNOSIS — Z9229 Personal history of other drug therapy: Secondary | ICD-10-CM | POA: Insufficient documentation

## 2014-02-08 DIAGNOSIS — I480 Paroxysmal atrial fibrillation: Secondary | ICD-10-CM

## 2014-02-08 NOTE — Patient Instructions (Signed)
Your physician has recommended you make the following change in your medication:  1) STOP AMIODARONE  TAKE ALL OTHER MEDICATIONS AS PRESCRIBED  YOU HAVE A FOLLOW UP APPT SCHEDULED FOR 04/11/14 @8 :45 AM

## 2014-02-08 NOTE — Progress Notes (Signed)
Patient ID: Melinda Noble, female   DOB: [birth year], 80 y.o.   MRN: [id number]    [address], [address], [place]  [address] Phone: [phone] Fax:  [phone]  Date:  02/08/2014   ID:  Melinda Noble, DOB 05-27-1930, MRN [id number]  PCP:  Henrine Screws, MD   ASSESSMENT:  1. Coronary artery disease with prior anterior myocardial infarction and LAD stents x2 the second 2014, asymptomatic with reference to angina 2. Chronic systolic heart failure with EF of 40% post infarct. Still has moderate exertional dyspnea 3. Paroxysmal atrial fibrillation post infarction 4. Amiodarone therapy used to suppress atrial fibrillation in the post MI.  PLAN:  1. Discontinue amiodarone 2. Consider diuretic adjustment as the patient still has dyspnea and I feel that her current diuretic regimen is homeopathic. Would consider low-dose spiral lactone 3. He'll followup in 2 months    SUBJECTIVE: Melinda Noble is a 80 y.o. female who complains of exertional dyspnea although this is gradually improving. She became dehydrated on 20 mg of furosemide daily after her myocardial infarction. She's had another overnight hospital stay after developing left shoulder discomfort and infarction was ruled out. During the first hospital stay when she was treated with LAD stents for cardiogenic shock and anterior STEMI, she had paroxysmal atrial fibrillation for which amiodarone was started. She has had no side effects this medication. She denies recurrent palpitations. She has not had syncope. She denies orthopnea and edema.   Wt Readings from Last 3 Encounters:  02/08/14 195 lb (88.451 kg)  01/30/14 192 lb (87.091 kg)  01/16/14 193 lb 4.8 oz (87.68 kg)     Past Medical History  Diagnosis Date  . Hypertension   . Migraine headache     a. Remotely.  Marland Kitchen Hypothyroidism   . Cardiogenic shock     a. 09/2013 - during admission for STEMI.  . H/O Acute respiratory failure due to hypoxia 10/10/2013    a.  09/2013 - secondary to anterior STEMI related pulmonary edema (resolved).  . CAD S/P percutaneous coronary angioplasty 10/09/13    DES x 2 to Mid LAD, 2.5 x 38, 2.5 x 12 Promus (covering extensive disection), moderate RCA disesase  . Ischemic cardiomyopathy     a. EF 30-35% during 09/2013 adm for STEMI. b. Repeat Echo EF 40-45% with distal LAD hypokinesis (10/16/14 following STEMI recovery).  Marland Kitchen CAD (coronary artery disease)     a. STEMI 10/09/13 - 100% LAD - following initial angioplasty, ? Spontaneous dissection vs related to ballon. s/p complex PCI with 2 overlapping DES to LAD. Adm complicated by VDRF, cardiogenic shock, peri-MI PAF with NSR on amiodarone. Residual mod RCA disease.   Marland Kitchen Dyslipidemia   . PAF (paroxysmal atrial fibrillation)     a. 09/2013 - peri-MI, NSR at discharge on amiodarone.  . Orthostatic hypotension     a. Adm 10/2013 for this, felt due to Lasix. Not on regular diuretic due to this/dehydration.  . Sinus bradycardia   . Anemia   . Depression     Current Outpatient Prescriptions  Medication Sig Dispense Refill  . acetaminophen (TYLENOL) 500 MG tablet Take 500-1,000 mg by mouth every 6 (six) hours as needed for mild pain.      Marland Kitchen ALPRAZolam (XANAX) 0.25 MG tablet Take 0.25 mg by mouth 2 (two) times daily as needed for anxiety.       Marland Kitchen amiodarone (PACERONE) 200 MG tablet Take 1 tablet (200 mg total) by mouth daily.  30 tablet  0  . aspirin 81 MG chewable tablet Chew 1 tablet (81 mg total) by mouth daily.      . Biotin 1000 MCG tablet Take 1,000 mcg by mouth daily.      Marland Kitchen buPROPion (WELLBUTRIN XL) 150 MG 24 hr tablet Take 150 mg by mouth daily.      . calcium-vitamin D (OSCAL WITH D) 500-200 MG-UNIT per tablet Take 2 tablets by mouth daily.      . carvedilol (COREG) 6.25 MG tablet Take 6.25 mg by mouth 2 (two) times daily with a meal.      . cholecalciferol (VITAMIN D) 1000 UNITS tablet Take 1,000 Units by mouth daily.      . furosemide (LASIX) 20 MG tablet Take by  mouth daily. 1/2 tablet m/w/f      . levothyroxine (SYNTHROID, LEVOTHROID) 100 MCG tablet Take 100 mcg by mouth daily before breakfast.      . losartan (COZAAR) 50 MG tablet Take 1 tablet (50 mg total) by mouth daily.  30 tablet  5  . Multiple Vitamins-Minerals (MULTIVITAMIN WITH MINERALS) tablet Take 1 tablet by mouth daily.      . nitroGLYCERIN (NITROSTAT) 0.4 MG SL tablet Place 1 tablet (0.4 mg total) under the tongue every 5 (five) minutes as needed for chest pain.  25 tablet  6  . omeprazole (PRILOSEC) 20 MG capsule Take 20 mg by mouth daily.       [name] Glycol-Propyl Glycol (SYSTANE OP) Place 1-2 drops into both eyes 3 (three) times daily as needed (for dry eye or irritation).      . rosuvastatin (CRESTOR) 5 MG tablet Take 5 mg by mouth daily.      . sertraline (ZOLOFT) 50 MG tablet Take 50 mg by mouth daily.      . Ticagrelor (BRILINTA) 90 MG TABS tablet Take 1 tablet (90 mg total) by mouth 2 (two) times daily.  60 tablet  5  . vitamin E (VITAMIN E) 400 UNIT capsule Take 400 Units by mouth daily.       No current facility-administered medications for this visit.   Facility-Administered Medications Ordered in Other Visits  Medication Dose Route Frequency Provider Last Rate Last Dose  . etomidate (AMIDATE) injection    Anesthesia Intra-op Fulton Reek, MD   14 mg at 10/10/13 0105  . rocuronium [facility]) injection    Anesthesia Intra-op Fulton Reek, MD   50 mg at 10/10/13 0110  . succinylcholine (ANECTINE) injection    Anesthesia Intra-op Fulton Reek, MD   100 mg at 10/10/13 0105    Allergies:    Allergies  Allergen Reactions  . Ace Inhibitors Anaphylaxis  . Beta Adrenergic Blockers Anaphylaxis  . Iohexol Anaphylaxis     Code: HIVES, Desc: anaphylactic shock s/p contrast injection many yrs ago--suggested that pt NEVER have iv contrast//a.c., Onset Date: [id number]   . Atorvastatin Rash  . Latex Rash  . Penicillins Swelling and Rash  . Sulfa Antibiotics Rash     Social History:  The patient  reports that she has quit smoking. She does not have any smokeless tobacco history on file. She reports that she does not drink alcohol or use illicit drugs.   ROS:  Please see the history of present illness.   Denies cigarette use. Appetite is good. She has not had syncope. No transient neurological complaints. No lower extremity swelling.   All other systems reviewed and negative.   OBJECTIVE: VS:  BP 162/86  Pulse 55  Ht 5\' 6"  (1.676 m)  Wt 195 lb (88.451 kg)  BMI 31.49 kg/m2  SpO2 98% Well nourished, well developed, in no acute distress, elderly HEENT: normal Neck: JVD flat. Carotid bruit absent  Cardiac:  normal S1, S2; RRR; no murmur. An S4 gallop is present Lungs:  clear to auscultation bilaterally, no wheezing, rhonchi or rales Abd: soft, nontender, no hepatomegaly Ext: Edema absent. Pulses 2+ Skin: warm and dry Neuro:  CNs 2-12 intact, no focal abnormalities noted  EKG:  Not repeated       Signed, Illene Labrador III, MD 02/08/2014 10:54 AM

## 2014-02-09 ENCOUNTER — Encounter (HOSPITAL_COMMUNITY)
Admission: RE | Admit: 2014-02-09 | Discharge: 2014-02-09 | Disposition: A | Discharge status: Home / Self Care | Payer: Medicare Other | Source: Ambulatory Visit | Attending: Cardiology | Admitting: Cardiology

## 2014-02-09 DIAGNOSIS — I252 Old myocardial infarction: Secondary | ICD-10-CM | POA: Diagnosis not present

## 2014-02-09 DIAGNOSIS — Z5189 Encounter for other specified aftercare: Secondary | ICD-10-CM | POA: Diagnosis not present

## 2014-02-09 NOTE — Progress Notes (Signed)
Pt completed 36 exercise sessions today in Cardiac Rehab phase II program.  Pt with good attendance for exercise and for education classes.  Pt plans to continue her home exercise at [employer] [facility].  The exercise will be supervised and pt would benefit from continued interaction and monitoring.  Repeat phq2 score 1.  Family reports pt is depressed however pt does not admit or exhibit the behaviors.  Pt feels she has met her personal goals and feels confident to continue her exercise program.  Maurice Small RN BSN

## 2014-02-12 ENCOUNTER — Encounter (HOSPITAL_COMMUNITY): Payer: Medicare Other

## 2014-02-14 ENCOUNTER — Encounter (HOSPITAL_COMMUNITY): Payer: Medicare Other

## 2014-02-16 ENCOUNTER — Encounter (HOSPITAL_COMMUNITY): Payer: Medicare Other

## 2014-02-19 ENCOUNTER — Encounter (HOSPITAL_COMMUNITY): Payer: Medicare Other

## 2014-03-13 ENCOUNTER — Ambulatory Visit: Payer: Medicare Other | Admitting: Cardiology

## 2014-04-04 ENCOUNTER — Encounter: Payer: Self-pay | Admitting: Interventional Cardiology

## 2014-04-04 DIAGNOSIS — Z0000 Encounter for general adult medical examination without abnormal findings: Secondary | ICD-10-CM | POA: Diagnosis not present

## 2014-04-04 DIAGNOSIS — Z23 Encounter for immunization: Secondary | ICD-10-CM | POA: Diagnosis not present

## 2014-04-04 DIAGNOSIS — E039 Hypothyroidism, unspecified: Secondary | ICD-10-CM | POA: Diagnosis not present

## 2014-04-04 DIAGNOSIS — E7800 Pure hypercholesterolemia, unspecified: Secondary | ICD-10-CM | POA: Diagnosis not present

## 2014-04-04 DIAGNOSIS — E559 Vitamin D deficiency, unspecified: Secondary | ICD-10-CM | POA: Diagnosis not present

## 2014-04-04 DIAGNOSIS — Z1331 Encounter for screening for depression: Secondary | ICD-10-CM | POA: Diagnosis not present

## 2014-04-04 DIAGNOSIS — G4701 Insomnia due to medical condition: Secondary | ICD-10-CM | POA: Diagnosis not present

## 2014-04-04 DIAGNOSIS — I2510 Atherosclerotic heart disease of native coronary artery without angina pectoris: Secondary | ICD-10-CM | POA: Diagnosis not present

## 2014-04-04 DIAGNOSIS — K1370 Unspecified lesions of oral mucosa: Secondary | ICD-10-CM | POA: Diagnosis not present

## 2014-04-04 DIAGNOSIS — I10 Essential (primary) hypertension: Secondary | ICD-10-CM | POA: Diagnosis not present

## 2014-04-08 IMAGING — CR DG CHEST 1V PORT
1 series · 1 of 1 positions shown · non-contrast
Comparison: Chest radiograph performed 01/27/2013, and CTA of the
chest performed 10/09/2013

CLINICAL DATA: Assess endotracheal tube placement.

EXAM:
PORTABLE CHEST - 1 VIEW

[AP]
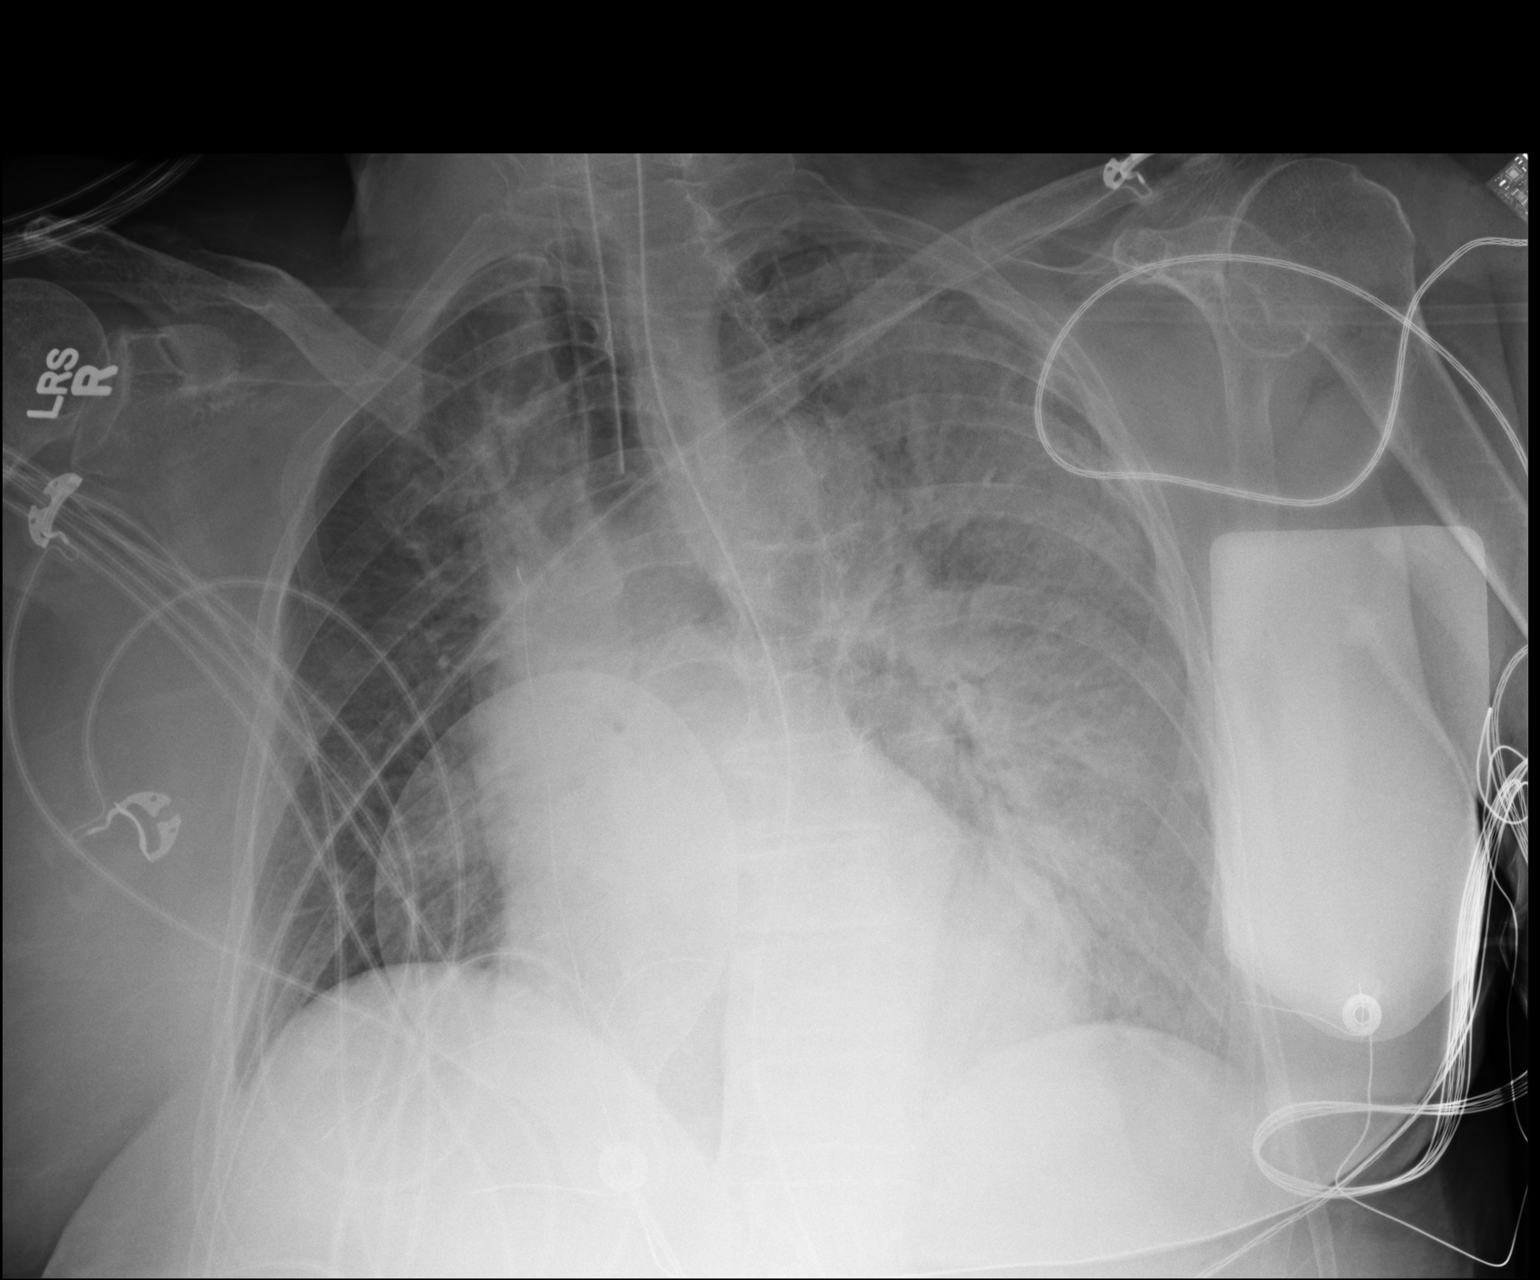

[1 of 1 positions shown; findings below may reference images not displayed]

FINDINGS: The patient's endotracheal tube is seen ending 4 cm above the
carina. Enteric tube is noted extending below the diaphragm.

There is new diffuse left-sided airspace opacification; this raises
concern for asymmetric flash pulmonary edema. More mild right
perihilar airspace opacity is seen. No pleural effusion or
pneumothorax is identified.

The cardiomediastinal silhouette is borderline normal in size. No
acute osseous abnormalities are seen. External pacing pads are
noted.
IMPRESSION: 1. Endotracheal tube seen ending 4 cm above the carina.
2. New diffuse left-sided airspace opacification raises concern for
asymmetric flash pulmonary edema. More mild right perihilar airspace
opacity also seen.

## 2014-04-09 ENCOUNTER — Other Ambulatory Visit: Payer: Self-pay | Admitting: *Deleted

## 2014-04-09 IMAGING — CR DG CHEST 1V PORT
1 series · 1 of 1 positions shown · non-contrast
Comparison: 10/10/2013.

CLINICAL DATA: Pulmonary edema .

EXAM:
PORTABLE CHEST - 1 VIEW

[AP]
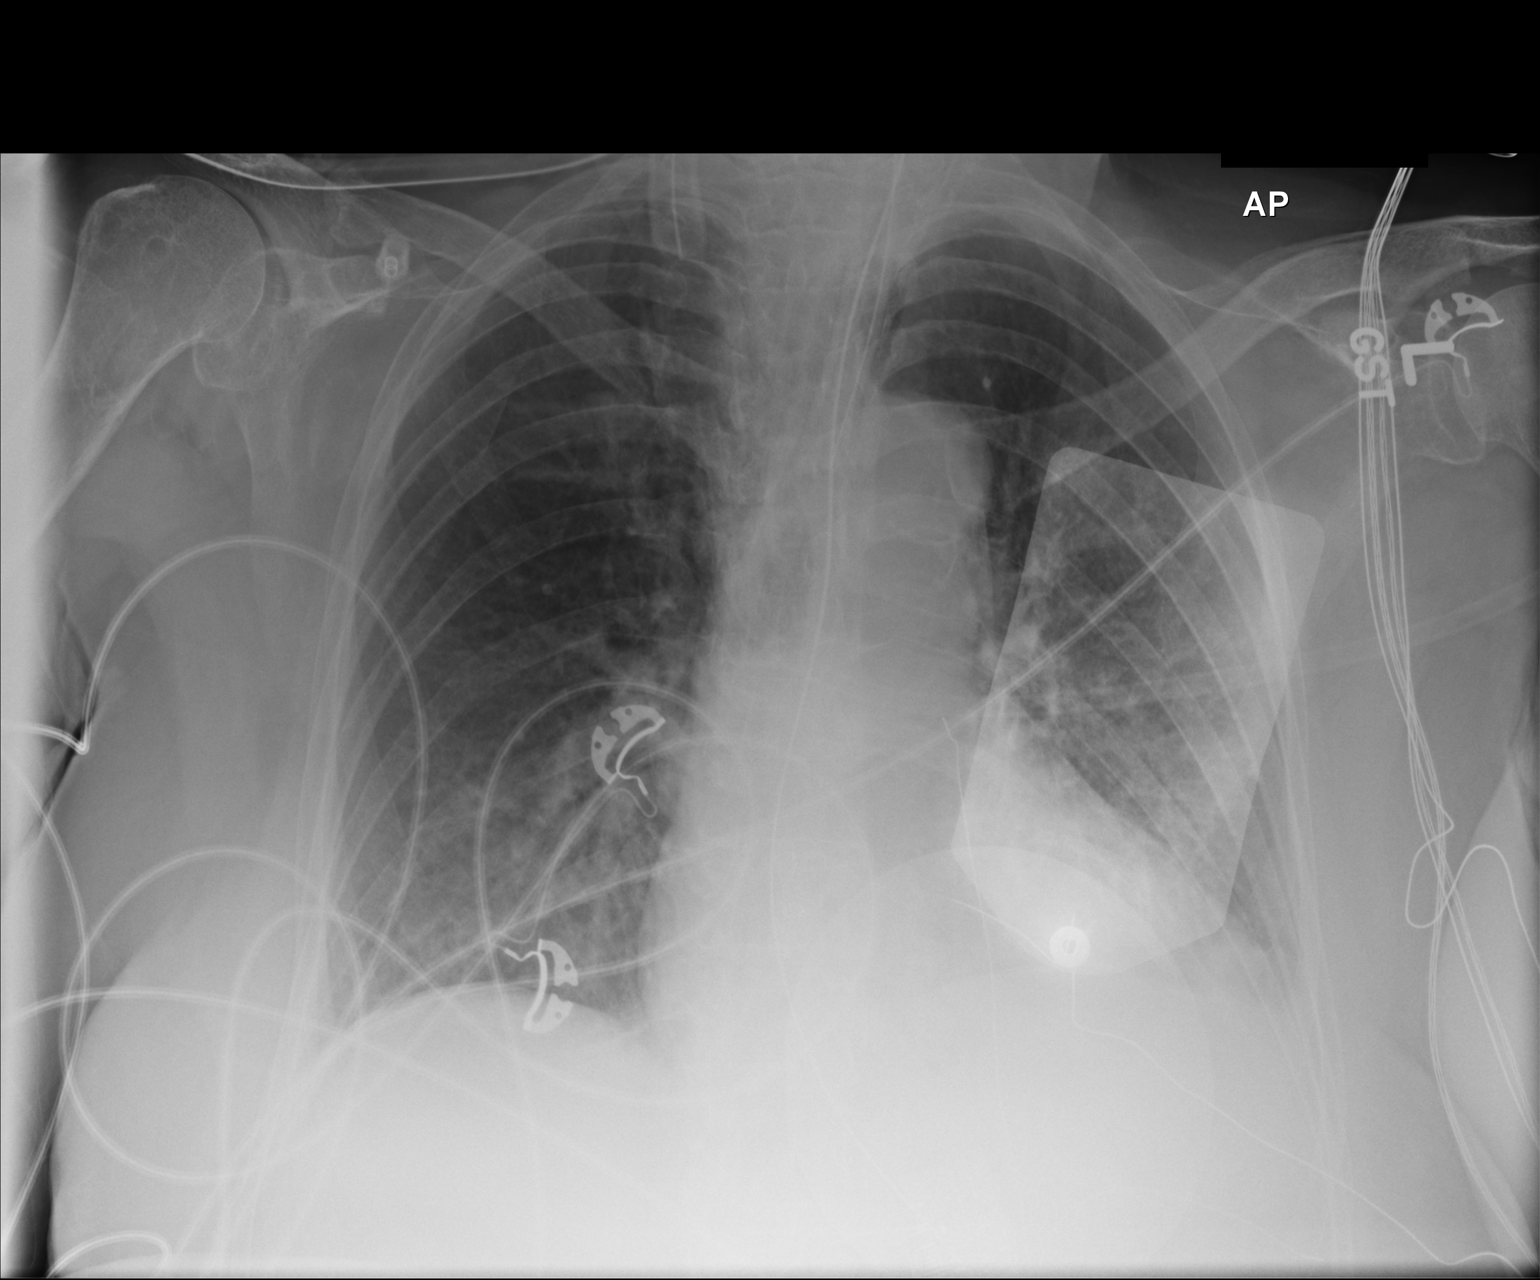

[1 of 1 positions shown; findings below may reference images not displayed]

FINDINGS: Endotracheal tube, and NG tube in stable anatomic position.
Defibrillator pads are noted. Previously identified severe diffuse
left lung infiltrate has partially cleared. Mild infiltrate present
in the right lung base. These findings suggest underlying pulmonary
edema with significant clearing of the left lung from prior exam. No
pleural effusion or pneumothorax.
IMPRESSION: 1.  Stable line and tube positions.

2. Significant interim clearing of diffuse left lung pulmonary
infiltrate suggesting clearing of asymmetric pulmonary edema. Mild
bibasilar infiltrates are present.

## 2014-04-09 MED ORDER — CARVEDILOL 6.25 MG PO TABS: 6.2500 mg | ORAL_TABLET | Freq: Two times a day (BID) | ORAL | Status: AC

## 2014-04-09 NOTE — Telephone Encounter (Signed)
Rx refill sent to patient pharmacy   

## 2014-04-10 ENCOUNTER — Ambulatory Visit (INDEPENDENT_AMBULATORY_CARE_PROVIDER_SITE_OTHER): Payer: Medicare Other | Admitting: Interventional Cardiology

## 2014-04-10 ENCOUNTER — Encounter: Payer: Self-pay | Admitting: Interventional Cardiology

## 2014-04-10 VITALS — BP 130/80 | HR 56 | Ht 66.0 in | Wt 197.0 lb

## 2014-04-10 DIAGNOSIS — I2510 Atherosclerotic heart disease of native coronary artery without angina pectoris: Secondary | ICD-10-CM | POA: Diagnosis not present

## 2014-04-10 DIAGNOSIS — I209 Angina pectoris, unspecified: Secondary | ICD-10-CM | POA: Diagnosis not present

## 2014-04-10 DIAGNOSIS — I25119 Atherosclerotic heart disease of native coronary artery with unspecified angina pectoris: Secondary | ICD-10-CM

## 2014-04-10 NOTE — Progress Notes (Signed)
Patient ID: Melinda Noble, female   DOB: 21-Nov-1929, 80 y.o.   MRN: [id number]    [address], [address] [place], [place]  [address] Phone: [phone] Fax:  [phone]  Date:  04/10/2014   ID:  Melinda Noble, DOB 04/19/1930, MRN [id number]  PCP:  Henrine Screws, MD   ASSESSMENT:  1. Coronary atherosclerosis with drug-eluting stent during STEMI December 2014, asymptomatic 2. Systolic heart failure with LVEF 30-35% improving to 40-45% 10/16/2014 3. Paroxysmal atrial fibrillation 4. Hypertension  PLAN:  1. Continue the current medical regimen. Consider discontinuation of Brilinta or switch to Plavix in December in December 2015 2. Encouraged physical activity 3. Clinical followup in 6 months   SUBJECTIVE: Melinda Noble is a 80 y.o. female who continues to have interscapular discomfort. It occurs predominantly when sitting. There is no exertional component to the discomfort. She denies chest pain. No shortness of breath. No palpitations or syncope. She does have bleeding/bruising.   Wt Readings from Last 3 Encounters:  04/10/14 197 lb (89.359 kg)  02/08/14 195 lb (88.451 kg)  01/30/14 192 lb (87.091 kg)     Past Medical History  Diagnosis Date  . Hypertension   . Migraine headache     a. Remotely.  Marland Kitchen Hypothyroidism   . Cardiogenic shock     a. 09/2013 - during admission for STEMI.  . H/O Acute respiratory failure due to hypoxia 10/10/2013    a. 09/2013 - secondary to anterior STEMI related pulmonary edema (resolved).  . CAD S/P percutaneous coronary angioplasty 10/09/13    DES x 2 to Mid LAD, 2.5 x 38, 2.5 x 12 Promus (covering extensive disection), moderate RCA disesase  . Ischemic cardiomyopathy     a. EF 30-35% during 09/2013 adm for STEMI. b. Repeat Echo EF 40-45% with distal LAD hypokinesis (10/16/14 following STEMI recovery).  Marland Kitchen CAD (coronary artery disease)     a. STEMI 10/09/13 - 100% LAD - following initial angioplasty, ? Spontaneous dissection vs related  to ballon. s/p complex PCI with 2 overlapping DES to LAD. Adm complicated by VDRF, cardiogenic shock, peri-MI PAF with NSR on amiodarone. Residual mod RCA disease.   Marland Kitchen Dyslipidemia   . PAF (paroxysmal atrial fibrillation)     a. 09/2013 - peri-MI, NSR at discharge on amiodarone.  . Orthostatic hypotension     a. Adm 10/2013 for this, felt due to Lasix. Not on regular diuretic due to this/dehydration.  . Sinus bradycardia   . Anemia   . Depression     Current Outpatient Prescriptions  Medication Sig Dispense Refill  . acetaminophen (TYLENOL) 500 MG tablet Take 500-1,000 mg by mouth every 6 (six) hours as needed for mild pain.      Marland Kitchen ALPRAZolam (XANAX) 0.25 MG tablet Take 0.25 mg by mouth 2 (two) times daily as needed for anxiety.       Marland Kitchen aspirin 81 MG chewable tablet Chew 1 tablet (81 mg total) by mouth daily.      . Biotin 1000 MCG tablet Take 1,000 mcg by mouth daily.      Marland Kitchen buPROPion (WELLBUTRIN XL) 150 MG 24 hr tablet Take 150 mg by mouth daily.      . calcium-vitamin D (OSCAL WITH D) 500-200 MG-UNIT per tablet Take 1 tablet by mouth daily.       . carvedilol (COREG) 6.25 MG tablet Take 1 tablet (6.25 mg total) by mouth 2 (two) times daily with a meal.  60 tablet  5  .  cholecalciferol (VITAMIN D) 1000 UNITS tablet Take 1,000 Units by mouth daily.      . furosemide (LASIX) 20 MG tablet Take by mouth daily. 1/2 tablet m/w/f      . levothyroxine (SYNTHROID, LEVOTHROID) 100 MCG tablet Take 100 mcg by mouth daily before breakfast.      . losartan (COZAAR) 50 MG tablet Take 1 tablet (50 mg total) by mouth daily.  30 tablet  5  . Multiple Vitamins-Minerals (MULTIVITAMIN WITH MINERALS) tablet Take 1 tablet by mouth daily.      . nitroGLYCERIN (NITROSTAT) 0.4 MG SL tablet Place 1 tablet (0.4 mg total) under the tongue every 5 (five) minutes as needed for chest pain.  25 tablet  6  . omeprazole (PRILOSEC) 20 MG capsule Take 20 mg by mouth daily.       [name] Glycol-Propyl Glycol (SYSTANE OP)  Place 1-2 drops into both eyes 3 (three) times daily as needed (for dry eye or irritation).      . rosuvastatin (CRESTOR) 5 MG tablet Take 5 mg by mouth daily.      . sertraline (ZOLOFT) 50 MG tablet Take 50 mg by mouth 2 (two) times daily.       . Ticagrelor (BRILINTA) 90 MG TABS tablet Take 1 tablet (90 mg total) by mouth 2 (two) times daily.  60 tablet  5  . vitamin E (VITAMIN E) 400 UNIT capsule Take 400 Units by mouth daily.       No current facility-administered medications for this visit.   Facility-Administered Medications Ordered in Other Visits  Medication Dose Route Frequency Provider Last Rate Last Dose  . etomidate (AMIDATE) injection    Anesthesia Intra-op Fulton Reek, MD   14 mg at 10/10/13 0105  . rocuronium [facility]) injection    Anesthesia Intra-op Fulton Reek, MD   50 mg at 10/10/13 0110  . succinylcholine (ANECTINE) injection    Anesthesia Intra-op Fulton Reek, MD   100 mg at 10/10/13 0105    Allergies:    Allergies  Allergen Reactions  . Ace Inhibitors Anaphylaxis  . Beta Adrenergic Blockers Anaphylaxis  . Iohexol Anaphylaxis     Code: HIVES, Desc: anaphylactic shock s/p contrast injection many yrs ago--suggested that pt NEVER have iv contrast//a.c., Onset Date: [id number]   . Atorvastatin Rash  . Latex Rash  . Penicillins Swelling and Rash  . Sulfa Antibiotics Rash    Social History:  The patient  reports that she has quit smoking. She does not have any smokeless tobacco history on file. She reports that she does not drink alcohol or use illicit drugs.   ROS:  Please see the history of present illness.   She has not had blood in her urine or stool. She denies orthopnea, PND, palpitations, syncope, and transient neurological complaints.   All other systems reviewed and negative.   OBJECTIVE: VS:  BP 130/80  Pulse 56  Ht 5\' 6"  (1.676 m)  Wt 197 lb (89.359 kg)  BMI 31.81 kg/m2 Well nourished, well developed, in no acute distress, elderly and  obese HEENT: normal Neck: JVD flat. Carotid bruit absent  Cardiac:  normal S1, S2; RRR; no murmur Lungs:  clear to auscultation bilaterally, no wheezing, rhonchi or rales Abd: soft, nontender, no hepatomegaly Ext: Edema absent. Pulses 2+ bilateral Skin: warm and dry Neuro:  CNs 2-12 intact, no focal abnormalities noted  EKG:  Sinus bradycardia with evidence of old inferior infarction and left axis deviation. Evidence of extensive anterolateral infarction to  Signed, Illene Labrador III, MD 04/10/2014 11:33 AM

## 2014-04-10 NOTE — Patient Instructions (Signed)
Your physician recommends that you continue on your current medications as directed. Please refer to the Current Medication list given to you today.  Your physician wants you to follow-up in: 6 months. You will receive a reminder letter in the mail two months in advance. If you don't receive a letter, please call our office to schedule the follow-up appointment.  

## 2014-04-11 ENCOUNTER — Ambulatory Visit: Payer: Medicare Other | Admitting: Interventional Cardiology

## 2014-04-30 ENCOUNTER — Other Ambulatory Visit: Payer: Self-pay | Admitting: *Deleted

## 2014-04-30 MED ORDER — LOSARTAN POTASSIUM 50 MG PO TABS: 50.0000 mg | ORAL_TABLET | Freq: Every day | ORAL | Status: DC

## 2014-05-12 ENCOUNTER — Other Ambulatory Visit: Payer: Self-pay | Admitting: Cardiology

## 2014-05-14 ENCOUNTER — Other Ambulatory Visit: Payer: Self-pay | Admitting: Interventional Cardiology

## 2014-05-14 DIAGNOSIS — M545 Low back pain, unspecified: Secondary | ICD-10-CM | POA: Diagnosis not present

## 2014-05-15 ENCOUNTER — Other Ambulatory Visit: Payer: Self-pay | Admitting: *Deleted

## 2014-05-15 NOTE — Telephone Encounter (Signed)
Rx refill denied, was sent in 04/09/14 for #[id number] with 5 additional refills

## 2014-05-21 DIAGNOSIS — M545 Low back pain, unspecified: Secondary | ICD-10-CM | POA: Diagnosis not present

## 2014-05-24 IMAGING — CR DG CHEST 1V PORT
1 series · 1 of 1 positions shown · non-contrast
Comparison: 10/11/2013

CLINICAL DATA: Chest pain and shortness of breath.

EXAM:
PORTABLE CHEST - 1 VIEW

[AP]
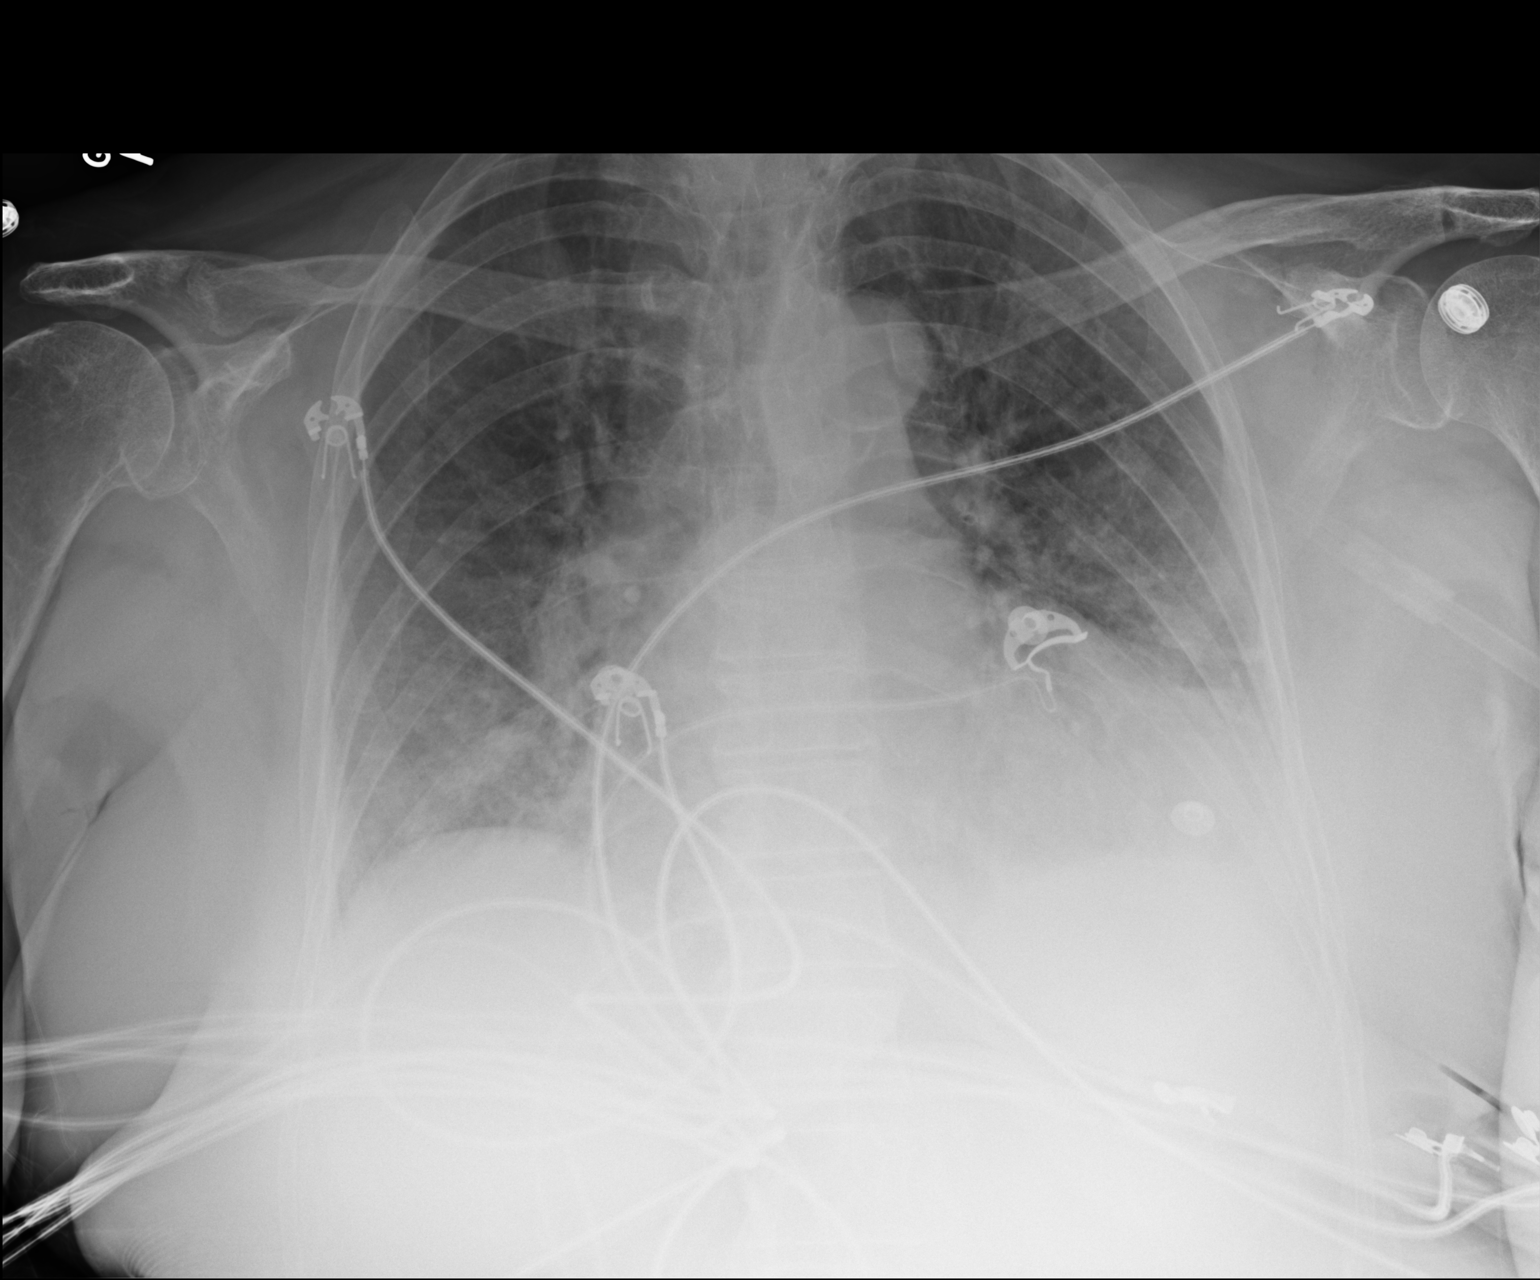

[1 of 1 positions shown; findings below may reference images not displayed]

FINDINGS: Endotracheal and enteric tubes have been removed. The cardiac
silhouette remains enlarged. Thoracic aorta is tortuous. There are
patchy perihilar and bibasilar opacities bilaterally. A small left
pleural effusion is questioned. There is no evidence of
pneumothorax. No acute osseous abnormality is identified.
IMPRESSION: New perihilar and bibasilar opacities, concerning for edema.
Possible small left pleural effusion.

## 2014-06-01 DIAGNOSIS — M545 Low back pain, unspecified: Secondary | ICD-10-CM | POA: Diagnosis not present

## 2014-06-04 ENCOUNTER — Other Ambulatory Visit: Payer: Self-pay

## 2014-06-04 DIAGNOSIS — M545 Low back pain, unspecified: Secondary | ICD-10-CM | POA: Diagnosis not present

## 2014-06-04 MED ORDER — FUROSEMIDE 20 MG PO TABS: 20.0000 mg | ORAL_TABLET | Freq: Every day | ORAL | Status: DC

## 2014-06-14 ENCOUNTER — Other Ambulatory Visit: Payer: Self-pay | Admitting: Internal Medicine

## 2014-06-14 ENCOUNTER — Ambulatory Visit
Admission: RE | Admit: 2014-06-14 | Discharge: 2014-06-14 | Disposition: A | Discharge status: Home / Self Care | Payer: Medicare Other | Source: Ambulatory Visit | Attending: Internal Medicine | Admitting: Internal Medicine

## 2014-06-14 DIAGNOSIS — K5900 Constipation, unspecified: Secondary | ICD-10-CM

## 2014-06-14 DIAGNOSIS — K8020 Calculus of gallbladder without cholecystitis without obstruction: Secondary | ICD-10-CM | POA: Diagnosis not present

## 2014-06-15 DIAGNOSIS — M545 Low back pain, unspecified: Secondary | ICD-10-CM | POA: Diagnosis not present

## 2014-07-04 DIAGNOSIS — L908 Other atrophic disorders of skin: Secondary | ICD-10-CM | POA: Diagnosis not present

## 2014-07-04 DIAGNOSIS — L819 Disorder of pigmentation, unspecified: Secondary | ICD-10-CM | POA: Diagnosis not present

## 2014-07-04 DIAGNOSIS — L723 Sebaceous cyst: Secondary | ICD-10-CM | POA: Diagnosis not present

## 2014-07-04 DIAGNOSIS — L821 Other seborrheic keratosis: Secondary | ICD-10-CM | POA: Diagnosis not present

## 2014-07-04 DIAGNOSIS — B356 Tinea cruris: Secondary | ICD-10-CM | POA: Diagnosis not present

## 2014-07-04 DIAGNOSIS — D1801 Hemangioma of skin and subcutaneous tissue: Secondary | ICD-10-CM | POA: Diagnosis not present

## 2014-07-04 DIAGNOSIS — D235 Other benign neoplasm of skin of trunk: Secondary | ICD-10-CM | POA: Diagnosis not present

## 2014-07-04 DIAGNOSIS — L940 Localized scleroderma [morphea]: Secondary | ICD-10-CM | POA: Diagnosis not present

## 2014-07-04 DIAGNOSIS — L918 Other hypertrophic disorders of the skin: Secondary | ICD-10-CM | POA: Diagnosis not present

## 2014-07-04 DIAGNOSIS — Z85828 Personal history of other malignant neoplasm of skin: Secondary | ICD-10-CM | POA: Diagnosis not present

## 2014-07-14 IMAGING — CR DG CHEST 2V
2 series · 2 of 2 positions shown · non-contrast
Comparison: 11/25/2013

CLINICAL DATA: Pain under left scapula

EXAM:
CHEST  2 VIEW

[w chest pa]
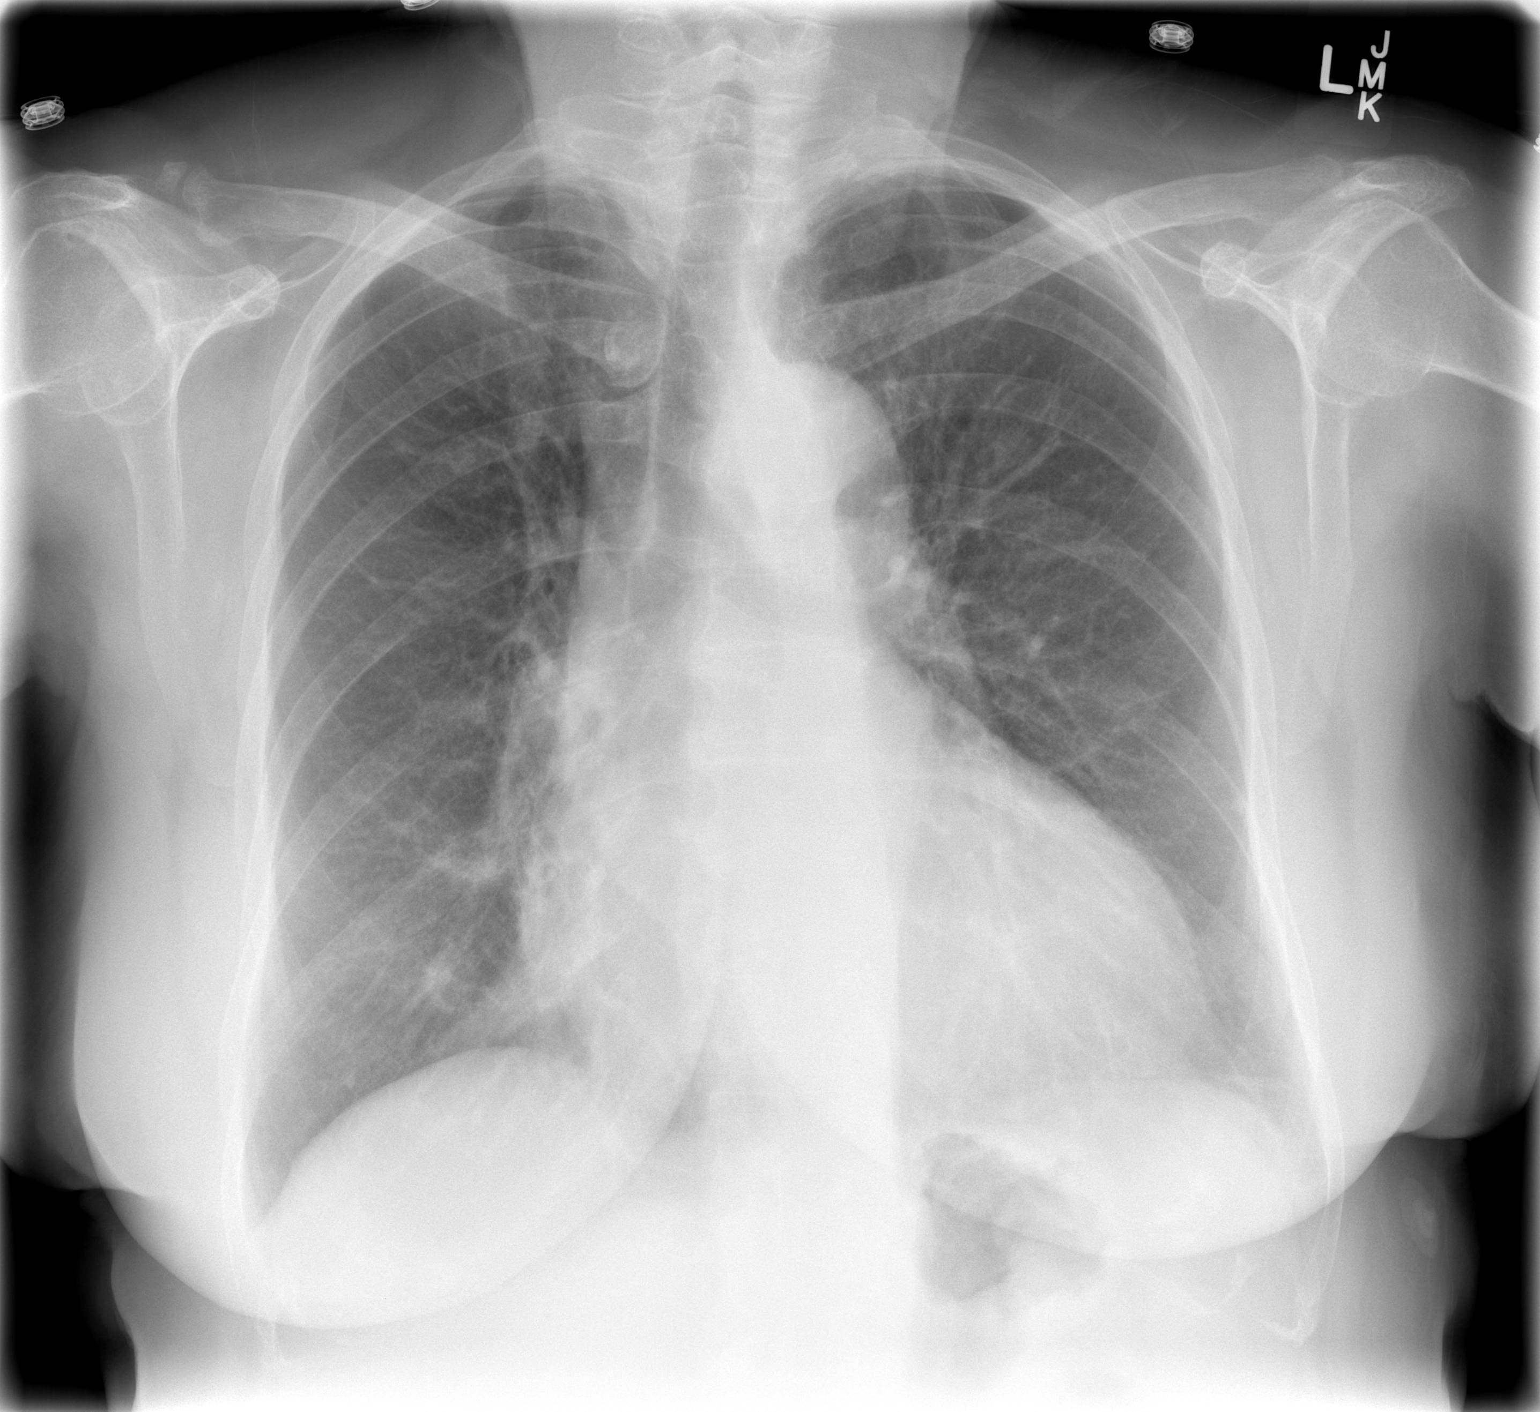

[w chest lat]
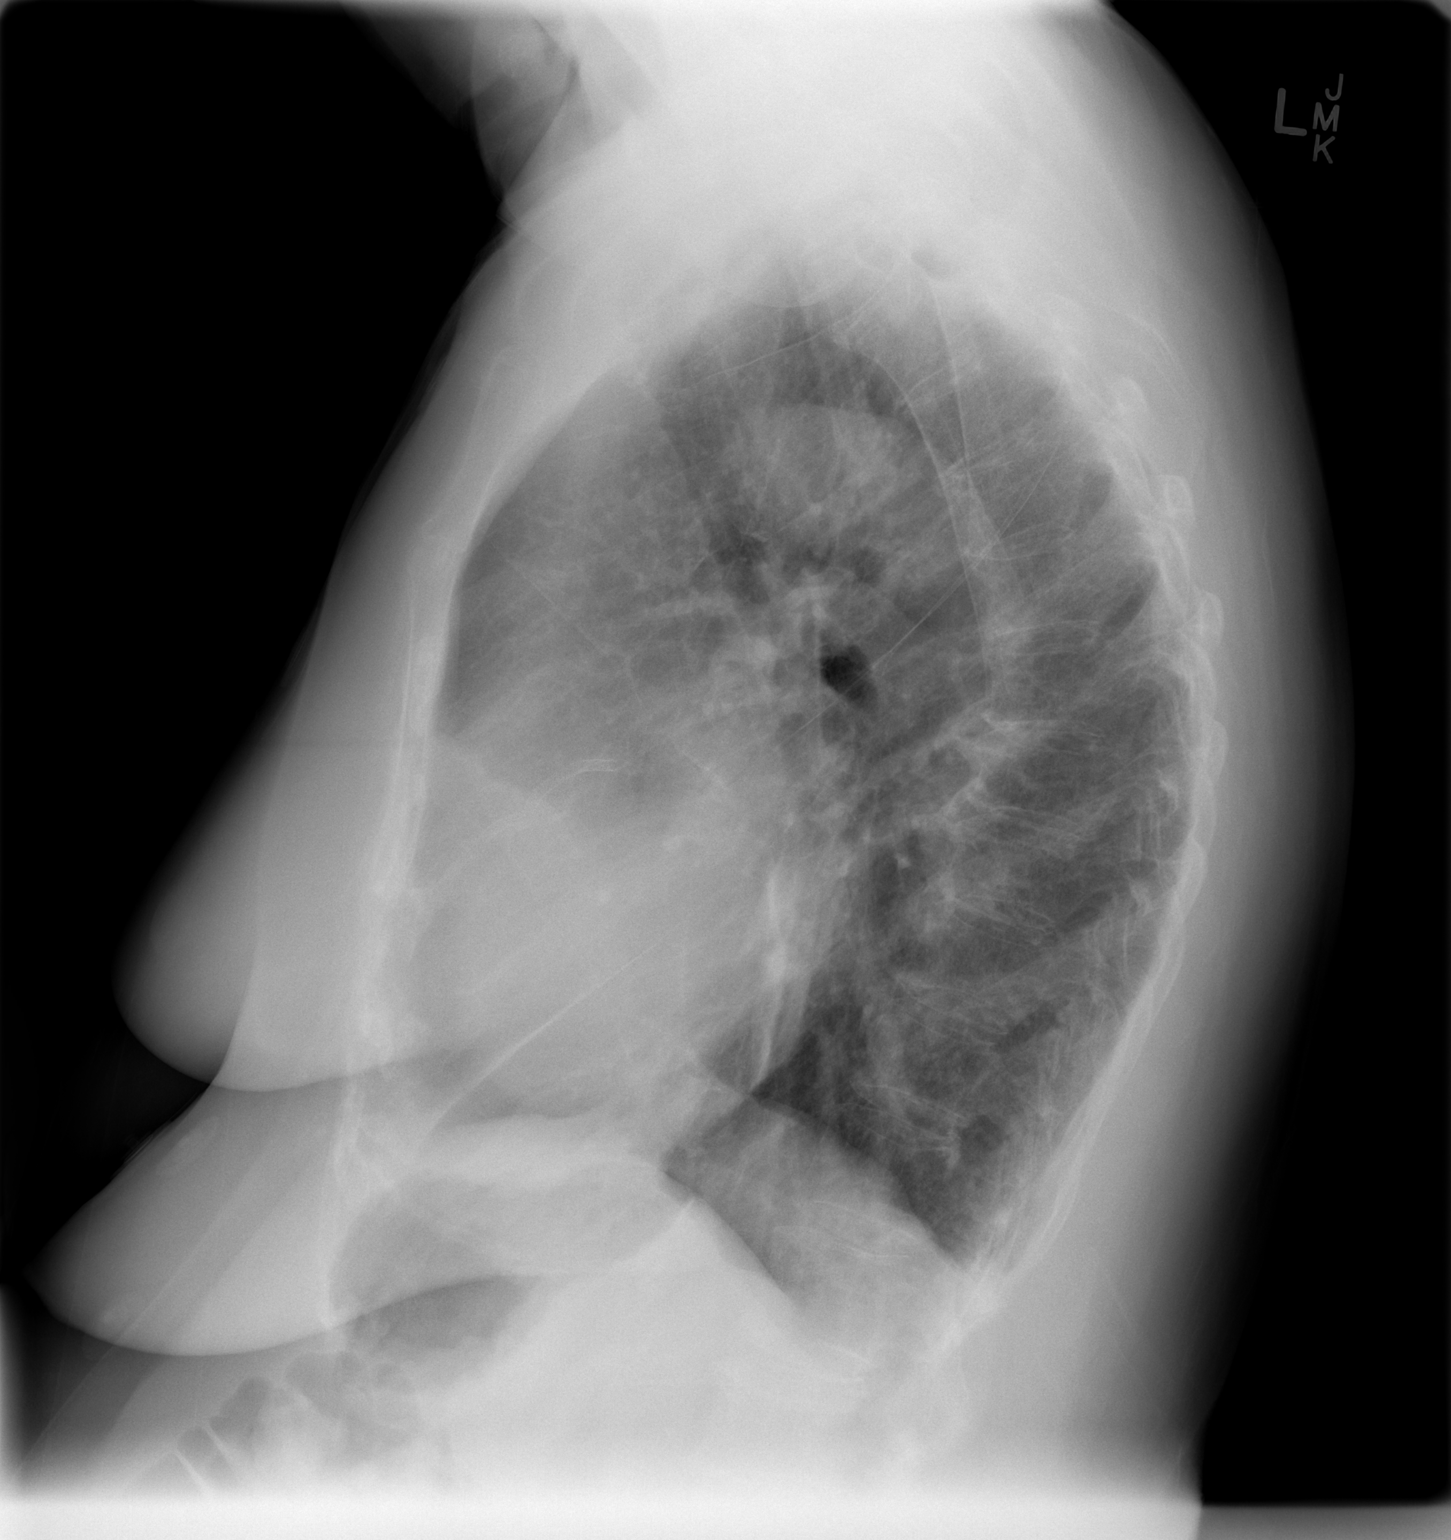

[2 of 2 positions shown; findings below may reference images not displayed]

FINDINGS: Cardiac shadow remains enlarged. The lungs are well aerated
bilaterally. No focal infiltrate or sizable effusion is seen. No
acute bony abnormality is noted. Degenerative change of the thoracic
spine is seen.
IMPRESSION: No active cardiopulmonary disease.

## 2014-08-03 DIAGNOSIS — Z23 Encounter for immunization: Secondary | ICD-10-CM | POA: Diagnosis not present

## 2014-08-08 ENCOUNTER — Other Ambulatory Visit: Payer: Self-pay

## 2014-08-08 DIAGNOSIS — Z1231 Encounter for screening mammogram for malignant neoplasm of breast: Secondary | ICD-10-CM

## 2014-08-22 DIAGNOSIS — K5900 Constipation, unspecified: Secondary | ICD-10-CM | POA: Diagnosis not present

## 2014-08-22 DIAGNOSIS — I10 Essential (primary) hypertension: Secondary | ICD-10-CM | POA: Diagnosis not present

## 2014-08-22 DIAGNOSIS — I2510 Atherosclerotic heart disease of native coronary artery without angina pectoris: Secondary | ICD-10-CM | POA: Diagnosis not present

## 2014-08-22 DIAGNOSIS — K521 Toxic gastroenteritis and colitis: Secondary | ICD-10-CM | POA: Diagnosis not present

## 2014-09-03 ENCOUNTER — Ambulatory Visit
Admission: RE | Admit: 2014-09-03 | Discharge: 2014-09-03 | Disposition: A | Discharge status: Home / Self Care | Payer: Medicare Other | Source: Ambulatory Visit

## 2014-09-03 DIAGNOSIS — Z1231 Encounter for screening mammogram for malignant neoplasm of breast: Secondary | ICD-10-CM

## 2014-09-06 DIAGNOSIS — H10413 Chronic giant papillary conjunctivitis, bilateral: Secondary | ICD-10-CM | POA: Diagnosis not present

## 2014-09-06 DIAGNOSIS — H02831 Dermatochalasis of right upper eyelid: Secondary | ICD-10-CM | POA: Diagnosis not present

## 2014-09-06 DIAGNOSIS — Z961 Presence of intraocular lens: Secondary | ICD-10-CM | POA: Diagnosis not present

## 2014-09-06 DIAGNOSIS — H31092 Other chorioretinal scars, left eye: Secondary | ICD-10-CM | POA: Diagnosis not present

## 2014-09-06 DIAGNOSIS — H02834 Dermatochalasis of left upper eyelid: Secondary | ICD-10-CM | POA: Diagnosis not present

## 2014-09-06 DIAGNOSIS — H26492 Other secondary cataract, left eye: Secondary | ICD-10-CM | POA: Diagnosis not present

## 2014-09-26 DIAGNOSIS — I10 Essential (primary) hypertension: Secondary | ICD-10-CM | POA: Diagnosis not present

## 2014-09-26 DIAGNOSIS — E780 Pure hypercholesterolemia: Secondary | ICD-10-CM | POA: Diagnosis not present

## 2014-09-27 ENCOUNTER — Encounter (HOSPITAL_COMMUNITY): Payer: Self-pay | Admitting: Cardiology

## 2014-10-03 ENCOUNTER — Encounter: Payer: Self-pay | Admitting: Interventional Cardiology

## 2014-10-03 ENCOUNTER — Ambulatory Visit (INDEPENDENT_AMBULATORY_CARE_PROVIDER_SITE_OTHER): Payer: Medicare Other | Admitting: Interventional Cardiology

## 2014-10-03 VITALS — BP 128/78 | HR 70 | Ht 66.0 in | Wt 176.4 lb

## 2014-10-03 DIAGNOSIS — I2510 Atherosclerotic heart disease of native coronary artery without angina pectoris: Secondary | ICD-10-CM

## 2014-10-03 DIAGNOSIS — I5022 Chronic systolic (congestive) heart failure: Secondary | ICD-10-CM

## 2014-10-03 DIAGNOSIS — Z9861 Coronary angioplasty status: Secondary | ICD-10-CM | POA: Diagnosis not present

## 2014-10-03 DIAGNOSIS — I255 Ischemic cardiomyopathy: Secondary | ICD-10-CM

## 2014-10-03 DIAGNOSIS — I10 Essential (primary) hypertension: Secondary | ICD-10-CM

## 2014-10-03 NOTE — Patient Instructions (Signed)
Your physician recommends that you continue on your current medications as directed. Please refer to the Current Medication list given to you today.  Your physician has requested that you have a lexiscan myoview. For further information please visit [email or web address]. Please follow instruction sheet, as given.(To be scheduled in January or February 2016)   Your physician recommends that you schedule a follow-up appointment in: 3-4 months with Dr.Smith

## 2014-10-03 NOTE — Progress Notes (Signed)
Patient ID: Melinda Noble, female   DOB: 11/26/1929, 80 y.o.   MRN: [id number]    [address], [address] [place], [place]  [address] Phone: [phone] Fax:  [phone]  Date:  10/03/2014   ID:  Melinda Noble, DOB Oct 27, 1929, MRN [id number]  PCP:  Henrine Screws, MD   ASSESSMENT:  1. Coronary artery disease with cardiogenic shock due to LAD occlusion in December 2014. Presenting symptom was interscapular and scapular pain. She now has a recurrence of left shoulder discomfort that is poorly characterized. 2. History of paroxysmal atrial fibrillation 3. Essential hypertension, controlled 4. 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 Pharmacologic myocardial perfusion study to rule out restenosis and LAD territory 2. Continue active lifestyle 3. Clinical follow-up in 3-4 months to determine whether or not Brilinta therapy will be discontinued.   SUBJECTIVE: Melinda Noble is a 80 y.o. female who is doing well. She and her husband both complain that she is having left shoulder discomfort. It is moderate in intensity. It comes and goes. Her concern is that her heart attack was associated with interscapular pain. She has not tried nitroglycerin. Walking occasionally is associated with shoulder blade discomfort. As a relatively localized spot. She can't remember at the quality of the discomfort is similar to her MI pain. The discomfort does not occur at rest.   Wt Readings from Last 3 Encounters:  10/03/14 176 lb 6.4 oz (80.015 kg)  04/10/14 197 lb (89.359 kg)  02/08/14 195 lb (88.451 kg)     Past Medical History  Diagnosis Date  . Hypertension   . Migraine headache     a. Remotely.  Marland Kitchen Hypothyroidism   . Cardiogenic shock     a. 09/2013 - during admission for STEMI.  . H/O Acute respiratory failure due to hypoxia 10/10/2013    a. 09/2013 - secondary to anterior STEMI related pulmonary edema (resolved).  . CAD S/P percutaneous coronary  angioplasty 10/09/13    DES x 2 to Mid LAD, 2.5 x 38, 2.5 x 12 Promus (covering extensive disection), moderate RCA disesase  . Ischemic cardiomyopathy     a. EF 30-35% during 09/2013 adm for STEMI. b. Repeat Echo EF 40-45% with distal LAD hypokinesis (10/16/14 following STEMI recovery).  Marland Kitchen CAD (coronary artery disease)     a. STEMI 10/09/13 - 100% LAD - following initial angioplasty, ? Spontaneous dissection vs related to ballon. s/p complex PCI with 2 overlapping DES to LAD. Adm complicated by VDRF, cardiogenic shock, peri-MI PAF with NSR on amiodarone. Residual mod RCA disease.   Marland Kitchen Dyslipidemia   . PAF (paroxysmal atrial fibrillation)     a. 09/2013 - peri-MI, NSR at discharge on amiodarone.  . Orthostatic hypotension     a. Adm 10/2013 for this, felt due to Lasix. Not on regular diuretic due to this/dehydration.  . Sinus bradycardia   . Anemia   . Depression     Current Outpatient Prescriptions  Medication Sig Dispense Refill  . acetaminophen (TYLENOL) 500 MG tablet Take 500-1,000 mg by mouth every 6 (six) hours as needed for mild pain.    Marland Kitchen ALPRAZolam (XANAX) 0.25 MG tablet Take 0.25 mg by mouth 2 (two) times daily as needed for anxiety.     Marland Kitchen aspirin 81 MG chewable tablet Chew 1 tablet (81 mg total) by mouth daily.    . Biotin 1000 MCG tablet Take 1,000 mcg by mouth daily.    Marland Kitchen BRILINTA 90 MG  TABS tablet TAKE 1 TABLET TWICE DAILY. 60 tablet 6  . buPROPion (WELLBUTRIN XL) 150 MG 24 hr tablet Take 150 mg by mouth daily.    . calcium-vitamin D (OSCAL WITH D) 500-200 MG-UNIT per tablet Take 1 tablet by mouth daily.     . carvedilol (COREG) 6.25 MG tablet Take 1 tablet (6.25 mg total) by mouth 2 (two) times daily with a meal. 60 tablet 5  . cholecalciferol (VITAMIN D) 1000 UNITS tablet Take 1,000 Units by mouth daily.    . furosemide (LASIX) 20 MG tablet Take 20 mg by mouth daily. Patient taking one half tablet daily.    Marland Kitchen levothyroxine (SYNTHROID, LEVOTHROID) 100 MCG tablet Take 100 mcg  by mouth daily before breakfast.    . losartan (COZAAR) 50 MG tablet Take 1 tablet (50 mg total) by mouth daily. 30 tablet 5  . Multiple Vitamins-Minerals (MULTIVITAMIN WITH MINERALS) tablet Take 1 tablet by mouth daily.    . nitroGLYCERIN (NITROSTAT) 0.4 MG SL tablet Place 1 tablet (0.4 mg total) under the tongue every 5 (five) minutes as needed for chest pain. 25 tablet 6  . omeprazole (PRILOSEC) 20 MG capsule Take 20 mg by mouth daily.     [name] Glycol-Propyl Glycol (SYSTANE OP) Place 1-2 drops into both eyes 3 (three) times daily as needed (for dry eye or irritation).    . rosuvastatin (CRESTOR) 5 MG tablet Take 5 mg by mouth daily.    . sertraline (ZOLOFT) 50 MG tablet Take 50 mg by mouth 2 (two) times daily.     . vitamin E (VITAMIN E) 400 UNIT capsule Take 400 Units by mouth daily.     No current facility-administered medications for this visit.   Facility-Administered Medications Ordered in Other Visits  Medication Dose Route Frequency Provider Last Rate Last Dose  . etomidate (AMIDATE) injection    Anesthesia Intra-op Fulton Reek, MD   14 mg at 10/10/13 0105  . rocuronium [facility]) injection    Anesthesia Intra-op Fulton Reek, MD   50 mg at 10/10/13 0110  . succinylcholine (ANECTINE) injection    Anesthesia Intra-op Fulton Reek, MD   100 mg at 10/10/13 0105    Allergies:    Allergies  Allergen Reactions  . Ace Inhibitors Anaphylaxis  . Beta Adrenergic Blockers Anaphylaxis  . Iohexol Anaphylaxis     Code: HIVES, Desc: anaphylactic shock s/p contrast injection many yrs ago--suggested that pt NEVER have iv contrast//a.c., Onset Date: [id number]   . Atorvastatin Rash  . Latex Rash  . Penicillins Swelling and Rash  . Sulfa Antibiotics Rash    Social History:  The patient  reports that she has quit smoking. She does not have any smokeless tobacco history on file. She reports that she does not drink alcohol or use illicit drugs.   ROS:  Please see the  history of present illness.   Denies palpitations. No blood in the urine or stool. She denies orthopnea PND.   All other systems reviewed and negative.   OBJECTIVE: VS:  BP 128/78 mmHg  Pulse 70  Ht 5\' 6"  (1.676 m)  Wt 176 lb 6.4 oz (80.015 kg)  BMI 28.49 kg/m2 Well nourished, well developed, in no acute distress, elderly HEENT: normal Neck: JVD flat. Carotid bruit absent  Cardiac:  normal S1, S2; RRR; no murmur Lungs:  clear to auscultation bilaterally, no wheezing, rhonchi or rales Abd: soft, nontender, no hepatomegaly Ext: Edema absent. Pulses 2+ Skin: warm and dry Neuro:  CNs 2-12 intact,  no focal abnormalities noted  EKG:  Not performed       Signed, Illene Labrador III, MD 10/03/2014 11:39 AM

## 2014-10-08 DIAGNOSIS — F329 Major depressive disorder, single episode, unspecified: Secondary | ICD-10-CM | POA: Diagnosis not present

## 2014-10-08 DIAGNOSIS — I700 Atherosclerosis of aorta: Secondary | ICD-10-CM | POA: Diagnosis not present

## 2014-10-08 DIAGNOSIS — E780 Pure hypercholesterolemia: Secondary | ICD-10-CM | POA: Diagnosis not present

## 2014-10-08 DIAGNOSIS — I252 Old myocardial infarction: Secondary | ICD-10-CM | POA: Diagnosis not present

## 2014-10-08 DIAGNOSIS — E039 Hypothyroidism, unspecified: Secondary | ICD-10-CM | POA: Diagnosis not present

## 2014-10-08 DIAGNOSIS — I2510 Atherosclerotic heart disease of native coronary artery without angina pectoris: Secondary | ICD-10-CM | POA: Diagnosis not present

## 2014-10-08 DIAGNOSIS — E559 Vitamin D deficiency, unspecified: Secondary | ICD-10-CM | POA: Diagnosis not present

## 2014-10-08 DIAGNOSIS — I10 Essential (primary) hypertension: Secondary | ICD-10-CM | POA: Diagnosis not present

## 2014-11-01 ENCOUNTER — Encounter (HOSPITAL_COMMUNITY): Payer: Self-pay | Admitting: Thoracic Surgery (Cardiothoracic Vascular Surgery)

## 2014-11-29 DIAGNOSIS — Z961 Presence of intraocular lens: Secondary | ICD-10-CM | POA: Diagnosis not present

## 2014-11-29 DIAGNOSIS — H04123 Dry eye syndrome of bilateral lacrimal glands: Secondary | ICD-10-CM | POA: Diagnosis not present

## 2014-11-29 DIAGNOSIS — H1132 Conjunctival hemorrhage, left eye: Secondary | ICD-10-CM | POA: Diagnosis not present

## 2014-11-29 DIAGNOSIS — H26492 Other secondary cataract, left eye: Secondary | ICD-10-CM | POA: Diagnosis not present

## 2014-12-05 ENCOUNTER — Ambulatory Visit (HOSPITAL_COMMUNITY): Payer: Medicare Other | Attending: Cardiology | Admitting: Radiology

## 2014-12-05 DIAGNOSIS — R079 Chest pain, unspecified: Secondary | ICD-10-CM

## 2014-12-05 DIAGNOSIS — I10 Essential (primary) hypertension: Secondary | ICD-10-CM | POA: Insufficient documentation

## 2014-12-05 DIAGNOSIS — Z9861 Coronary angioplasty status: Secondary | ICD-10-CM | POA: Diagnosis not present

## 2014-12-05 DIAGNOSIS — I2510 Atherosclerotic heart disease of native coronary artery without angina pectoris: Secondary | ICD-10-CM | POA: Diagnosis not present

## 2014-12-05 DIAGNOSIS — M25512 Pain in left shoulder: Secondary | ICD-10-CM | POA: Diagnosis not present

## 2014-12-05 MED ORDER — TECHNETIUM TC 99M SESTAMIBI GENERIC - CARDIOLITE
10.0000 | Freq: Once | INTRAVENOUS | Status: AC | PRN
  Administered 2014-12-05: 10 via INTRAVENOUS

## 2014-12-05 MED ORDER — REGADENOSON 0.4 MG/5ML IV SOLN
0.4000 mg | Freq: Once | INTRAVENOUS | Status: AC
  Administered 2014-12-05: 0.4 mg via INTRAVENOUS

## 2014-12-05 MED ORDER — TECHNETIUM TC 99M SESTAMIBI GENERIC - CARDIOLITE
30.0000 | Freq: Once | INTRAVENOUS | Status: AC | PRN
  Administered 2014-12-05: 30 via INTRAVENOUS

## 2014-12-05 NOTE — Progress Notes (Signed)
[facility] [address] [place], [place] [address] [phone]    Cardiology Nuclear Med Study  Melinda Noble is a 81 y.o. female     MRN : [id number]     DOB: 1930/06/05  Procedure Date: 12/05/2014  Nuclear Med Background Indication for Stress Test:  Evaluation for Ischemia History:  CAD - MI 2014, H/O AFIB Cardiac Risk Factors: Hypertension  Symptoms:  Left shoulder pain.   Nuclear Pre-Procedure Caffeine/Decaff Intake:  None NPO After: 8:00pm   Lungs:  clear O2 Sat: 99% on room air. IV 0.9% NS with Angio Cath:  22g  IV Site: R Antecubital  IV Started by:  Earl Many, CNMT  Chest Size (in):  38 Cup Size: C  Height:  66"  Weight:  176 lb (79.833 kg)  BMI:  Body mass index is 28.42 kg/(m^2). Tech Comments:  NA    Nuclear Med Study 1 or 2 day study: 1 day  Stress Test Type:  Lexiscan  Reading MD: Candee Furbish, MD  Order Authorizing Provider:  Linard Millers III, MD  Resting Radionuclide: Technetium 24m Sestamibi  Resting Radionuclide Dose: 11.0 mCi   Stress Radionuclide:  Technetium 88m Sestamibi  Stress Radionuclide Dose: 33.0 mCi           Stress Protocol Rest HR: 51 Stress HR: 63  Rest BP: 126/71 Stress BP: 134/69  Exercise Time (min): n/a METS: n/a   Predicted Max HR: 138 bpm % Max HR: 45.65 bpm Rate Pressure Product: 8442   Dose of Adenosine (mg):  n/a Dose of Lexiscan: 0.4 mg  Dose of Atropine (mg): n/a Dose of Dobutamine: n/a mcg/kg/min (at max HR)  Stress Test Technologist: Perrin Maltese, EMT-P  Nuclear Technologist:  Earl Many, CNMT     Rest Procedure:  Myocardial perfusion imaging was performed at rest 45 minutes following the intravenous administration of Technetium 32m Sestamibi. Rest ECG: Sinus bradycardia rate 51 with PVC, T-wave inversion lateral leads. Poor R-wave progression noted.  Stress Procedure:  The patient received IV Lexiscan 0.4 mg over 15-seconds.  Technetium 102m Sestamibi injected at 30-seconds. This  patient had sob with the Lexiscan injection. Quantitative spect images were obtained after a 45 minute delay. Stress ECG: No significant change from baseline ECG  QPS Raw Data Images:  Normal; no motion artifact; normal heart/lung ratio. Stress Images:  There is decreased uptake along the mid to distal anteroseptal region as well as distal apical lateral region consistent with prior infarction. There is no significant peri-infarct ischemia identified. Otherwise, homogeneous radiotracer uptake at rest and stress. Rest Images:  As above Subtraction (SDS):  No evidence of ischemia. Transient Ischemic Dilatation (Normal <1.22):  0.97 Lung/Heart Ratio (Normal <0.45):  0.29  Quantitative Gated Spect Images QGS EDV:  126 ml QGS ESV:  68 ml  Impression Exercise Capacity:  Lexiscan with no exercise. BP Response:  Normal blood pressure response. Clinical Symptoms:  Typical symptoms with Lexiscan ECG Impression:  No significant ST segment change suggestive of ischemia. Comparison with Prior Nuclear Study: No images to compare  Overall Impression:  Intermediate risk stress nuclear study secondary to old anteroseptal/apical lateral infarct pattern as well as reduced ejection fraction of 46%. No evidence of ischemia identified..  LV Ejection Fraction: 46%.  LV Wall Motion:  There is hypokinesis of the apical, distal anteroseptal region as well as distal inferoseptal region.  Candee Furbish, MD

## 2014-12-07 ENCOUNTER — Encounter: Payer: Self-pay | Admitting: Internal Medicine

## 2014-12-10 ENCOUNTER — Telehealth: Payer: Self-pay | Admitting: Interventional Cardiology

## 2014-12-10 NOTE — Telephone Encounter (Signed)
-----   Message from Sinclair Grooms, MD sent at 12/07/2014  9:48 PM EST ----- Intermediate risk study with evidence of known MI.

## 2014-12-10 NOTE — Telephone Encounter (Signed)
Pt aware of myoview results.Intermediate risk study with evidence of known MI.pt rqst a copy of results be mailed. Pt aware f/u appt with Dr.Smioth scheduled on 01/10/15 @ 10am

## 2014-12-10 NOTE — Telephone Encounter (Signed)
New message ° ° ° ° °Want stress test results °

## 2014-12-12 DIAGNOSIS — R194 Change in bowel habit: Secondary | ICD-10-CM | POA: Diagnosis not present

## 2014-12-18 DIAGNOSIS — I10 Essential (primary) hypertension: Secondary | ICD-10-CM | POA: Diagnosis not present

## 2015-01-10 ENCOUNTER — Ambulatory Visit (INDEPENDENT_AMBULATORY_CARE_PROVIDER_SITE_OTHER): Payer: Medicare Other | Admitting: Interventional Cardiology

## 2015-01-10 ENCOUNTER — Encounter: Payer: Self-pay | Admitting: Interventional Cardiology

## 2015-01-10 VITALS — BP 128/72 | HR 60 | Ht 66.0 in | Wt 172.4 lb

## 2015-01-10 DIAGNOSIS — Z9861 Coronary angioplasty status: Secondary | ICD-10-CM

## 2015-01-10 DIAGNOSIS — E785 Hyperlipidemia, unspecified: Secondary | ICD-10-CM

## 2015-01-10 DIAGNOSIS — I10 Essential (primary) hypertension: Secondary | ICD-10-CM

## 2015-01-10 DIAGNOSIS — I5022 Chronic systolic (congestive) heart failure: Secondary | ICD-10-CM | POA: Diagnosis not present

## 2015-01-10 DIAGNOSIS — I2510 Atherosclerotic heart disease of native coronary artery without angina pectoris: Secondary | ICD-10-CM | POA: Diagnosis not present

## 2015-01-10 DIAGNOSIS — Z955 Presence of coronary angioplasty implant and graft: Secondary | ICD-10-CM | POA: Diagnosis not present

## 2015-01-10 MED ORDER — CLOPIDOGREL BISULFATE 75 MG PO TABS: 75.0000 mg | ORAL_TABLET | Freq: Every day | ORAL | Status: DC

## 2015-01-10 NOTE — Patient Instructions (Signed)
Your physician has recommended you make the following change in your medication:  1) COMPLETE your supply of Brilinta. Then START Plavix 75mg  daily   Your physician wants you to follow-up in: 1 year with Dr.Smith You will receive a reminder letter in the mail two months in advance. If you don't receive a letter, please call our office to schedule the follow-up appointment.

## 2015-01-10 NOTE — Progress Notes (Signed)
Cardiology Office Note   Date:  01/10/2015   ID:  Melinda Noble, DOB 11-20-1929, MRN [id number]  PCP:  Henrine Screws, MD  Cardiologist:   Sinclair Grooms, MD   No chief complaint on file.     History of Present Illness: Melinda Noble is a 81 y.o. female who presents for coronary artery disease and systolic heart failure. Postinfarct LVEF was 35%. A recent myocardial perfusion study demonstrated LVEF 46% (which is probably an underestimate), a fixed anteroseptal and apical defect, and no ischemia.    Past Medical History  Diagnosis Date  . Hypertension   . Migraine headache     a. Remotely.  Marland Kitchen Hypothyroidism   . Cardiogenic shock     a. 09/2013 - during admission for STEMI.  . H/O Acute respiratory failure due to hypoxia 10/10/2013    a. 09/2013 - secondary to anterior STEMI related pulmonary edema (resolved).  . CAD S/P percutaneous coronary angioplasty 10/09/13    DES x 2 to Mid LAD, 2.5 x 38, 2.5 x 12 Promus (covering extensive disection), moderate RCA disesase  . Ischemic cardiomyopathy     a. EF 30-35% during 09/2013 adm for STEMI. b. Repeat Echo EF 40-45% with distal LAD hypokinesis (10/16/14 following STEMI recovery).  Marland Kitchen CAD (coronary artery disease)     a. STEMI 10/09/13 - 100% LAD - following initial angioplasty, ? Spontaneous dissection vs related to ballon. s/p complex PCI with 2 overlapping DES to LAD. Adm complicated by VDRF, cardiogenic shock, peri-MI PAF with NSR on amiodarone. Residual mod RCA disease.   Marland Kitchen Dyslipidemia   . PAF (paroxysmal atrial fibrillation)     a. 09/2013 - peri-MI, NSR at discharge on amiodarone.  . Orthostatic hypotension     a. Adm 10/2013 for this, felt due to Lasix. Not on regular diuretic due to this/dehydration.  . Sinus bradycardia   . Anemia   . Depression     Past Surgical History  Procedure Laterality Date  . Shoulder surgery    . Coronary angioplasty with stent placement  10/09/13    2 overlapping Promus DES to mid  LAD, occlusion/dissection (2.5 x 38, 2.5 x 12)  . Left heart catheterization with coronary angiogram N/A 10/09/2013    Procedure: LEFT HEART CATHETERIZATION WITH CORONARY ANGIOGRAM;  Surgeon: Leonie Man, MD;  Location: [facility] CATH LAB;  Service: Cardiovascular;  Laterality: N/A;     Current Outpatient Prescriptions  Medication Sig Dispense Refill  . acetaminophen (TYLENOL) 500 MG tablet Take 500-1,000 mg by mouth every 6 (six) hours as needed for mild pain.    Marland Kitchen ALPRAZolam (XANAX) 0.25 MG tablet Take 0.25 mg by mouth 2 (two) times daily as needed for anxiety.     Marland Kitchen aspirin 81 MG chewable tablet Chew 1 tablet (81 mg total) by mouth daily.    . Biotin 1000 MCG tablet Take 1,000 mcg by mouth daily.    Marland Kitchen BRILINTA 90 MG TABS tablet TAKE 1 TABLET TWICE DAILY. 60 tablet 6  . buPROPion (WELLBUTRIN XL) 150 MG 24 hr tablet Take 150 mg by mouth daily.    . calcium-vitamin D (OSCAL WITH D) 500-200 MG-UNIT per tablet Take 1 tablet by mouth daily.     . carvedilol (COREG) 6.25 MG tablet Take 1 tablet (6.25 mg total) by mouth 2 (two) times daily with a meal. 60 tablet 5  . cholecalciferol (VITAMIN D) 1000 UNITS tablet Take 1,000 Units by mouth daily.    . furosemide (LASIX)  20 MG tablet Take 20 mg by mouth daily. Patient taking one half tablet daily.    Marland Kitchen levothyroxine (SYNTHROID, LEVOTHROID) 100 MCG tablet Take 100 mcg by mouth daily before breakfast.    . losartan (COZAAR) 50 MG tablet Take 1 tablet (50 mg total) by mouth daily. 30 tablet 5  . Multiple Vitamins-Minerals (MULTIVITAMIN WITH MINERALS) tablet Take 1 tablet by mouth daily.    . nitroGLYCERIN (NITROSTAT) 0.4 MG SL tablet Place 1 tablet (0.4 mg total) under the tongue every 5 (five) minutes as needed for chest pain. 25 tablet 6  . omeprazole (PRILOSEC) 20 MG capsule Take 20 mg by mouth daily.     [name] Glycol-Propyl Glycol (SYSTANE OP) Place 1-2 drops into both eyes 3 (three) times daily as needed (for dry eye or irritation).    .  rosuvastatin (CRESTOR) 5 MG tablet Take 5 mg by mouth daily.    . sertraline (ZOLOFT) 50 MG tablet Take 50 mg by mouth 2 (two) times daily.     . vitamin E (VITAMIN E) 400 UNIT capsule Take 400 Units by mouth daily.     No current facility-administered medications for this visit.   Facility-Administered Medications Ordered in Other Visits  Medication Dose Route Frequency Provider Last Rate Last Dose  . etomidate (AMIDATE) injection    Anesthesia Intra-op Fulton Reek, MD   14 mg at 10/10/13 0105  . rocuronium [facility]) injection    Anesthesia Intra-op Fulton Reek, MD   50 mg at 10/10/13 0110  . succinylcholine (ANECTINE) injection    Anesthesia Intra-op Fulton Reek, MD   100 mg at 10/10/13 0105    Allergies:   Ace inhibitors; Beta adrenergic blockers; Iohexol; Atorvastatin; Latex; Penicillins; and Sulfa antibiotics    Social History:  The patient  reports that she has quit smoking. She does not have any smokeless tobacco history on file. She reports that she does not drink alcohol or use illicit drugs.   Family History:  The patient's family history includes Anemia in her mother; Heart attack in her father; Heart failure in her mother; Varicose Veins in her father.    ROS:  Please see the history of present illness.   Otherwise, review of systems are positive for orthostatic dizziness since increasing to 100 mg of losartan.   All other systems are reviewed and negative.    PHYSICAL EXAM: VS:  BP 128/72 mmHg  Pulse 60  Ht 5\' 6"  (1.676 m)  Wt 172 lb 6.4 oz (78.2 kg)  BMI 27.84 kg/m2 , BMI Body mass index is 27.84 kg/(m^2). GEN: Well nourished, well developed, in no acute distress HEENT: normal Neck: no JVD, carotid bruits, or masses Cardiac: RRR; no murmurs, rubs, or gallops,no edema  Respiratory:  clear to auscultation bilaterally, normal work of breathing GI: soft, nontender, nondistended, + BS MS: no deformity or atrophy Skin: warm and dry, no rash Neuro:   Strength and sensation are intact Psych: euthymic mood, full affect   EKG:  EKG is ordered today. The ekg ordered today demonstrates normal sinus rhythm/sinus bradycardia, left atrial abnormality, inferolateral lateral and anterolateral Q waves. The EKG is unchanged compared to prior   Recent Labs: 01/15/2014: Pro B Natriuretic peptide (BNP) 1705.0* 01/16/2014: BUN 15; Creatinine 0.86; Hemoglobin 12.3; Platelets 151; Potassium 4.3; Sodium 139    Lipid Panel    Component Value Date/Time   CHOL 98 10/23/2013   TRIG 130 10/23/2013   HDL 33* 10/23/2013   CHOLHDL 3.8 10/10/2013 0430  VLDL 15 10/10/2013 0430   LDLCALC 46 10/23/2013      Wt Readings from Last 3 Encounters:  01/10/15 172 lb 6.4 oz (78.2 kg)  12/05/14 176 lb (79.833 kg)  10/03/14 176 lb 6.4 oz (80.015 kg)      Other studies Reviewed: Additional studies/ records that were reviewed today include: .   ASSESSMENT AND PLAN:  Chronic systolic heart failure: There is no evidence of volume overload. Systolic heart failure diagnosis should be changed to diastolic heart failure now that LVEF is 46%.. I explained to the patient is she will need to remain on carvedilol and losartan to prevent recurrent significant systolic dysfunction.  Presence of drug coated stent in LAD coronary artery - 2 overlapping Promx DES 2.5 x 38, 2.5 x 12 (postdilated to ~3 mm): The patient denies angina. Because she has 50 mm of drug-eluting stent in the LAD we have decided to use prolonged antiplatelet therapy. She has been on Brilinta for greater than a year.  Dyslipidemia: Managed by primary care physician, Dr. Mertha Finders  Essential hypertension: Under very good control. Some orthostatic dizziness therefore leading to reduction in losartan dose.    Current medicines are reviewed at length with the patient today.  The patient has concerns regarding medicines.  The following changes have been made:  Change Brilinta to clopidogrel 75 mg per  day. The plan would be to discontinue clopidogrel on return visit in one year.  Labs/ tests ordered today include:  No orders of the defined types were placed in this encounter.     Disposition:   FU with Linard Millers in 1 Year   Signed, Sinclair Grooms, MD  01/10/2015 10:28 AM    [address], [place], [place]  [address] Phone: [phone]; Fax: [phone]

## 2015-03-22 DIAGNOSIS — D485 Neoplasm of uncertain behavior of skin: Secondary | ICD-10-CM | POA: Diagnosis not present

## 2015-03-22 DIAGNOSIS — L281 Prurigo nodularis: Secondary | ICD-10-CM | POA: Diagnosis not present

## 2015-03-22 DIAGNOSIS — Z85828 Personal history of other malignant neoplasm of skin: Secondary | ICD-10-CM | POA: Diagnosis not present

## 2015-04-04 ENCOUNTER — Ambulatory Visit: Payer: Medicare Other | Admitting: Interventional Cardiology

## 2015-04-04 DIAGNOSIS — M5430 Sciatica, unspecified side: Secondary | ICD-10-CM | POA: Diagnosis not present

## 2015-04-04 DIAGNOSIS — I10 Essential (primary) hypertension: Secondary | ICD-10-CM | POA: Diagnosis not present

## 2015-04-04 DIAGNOSIS — F329 Major depressive disorder, single episode, unspecified: Secondary | ICD-10-CM | POA: Diagnosis not present

## 2015-04-04 DIAGNOSIS — I700 Atherosclerosis of aorta: Secondary | ICD-10-CM | POA: Diagnosis not present

## 2015-04-04 DIAGNOSIS — I252 Old myocardial infarction: Secondary | ICD-10-CM | POA: Diagnosis not present

## 2015-04-04 DIAGNOSIS — E039 Hypothyroidism, unspecified: Secondary | ICD-10-CM | POA: Diagnosis not present

## 2015-04-04 DIAGNOSIS — E559 Vitamin D deficiency, unspecified: Secondary | ICD-10-CM | POA: Diagnosis not present

## 2015-04-15 ENCOUNTER — Other Ambulatory Visit: Payer: Self-pay

## 2015-04-18 DIAGNOSIS — M5441 Lumbago with sciatica, right side: Secondary | ICD-10-CM | POA: Diagnosis not present

## 2015-04-18 DIAGNOSIS — M5442 Lumbago with sciatica, left side: Secondary | ICD-10-CM | POA: Diagnosis not present

## 2015-04-24 DIAGNOSIS — M5442 Lumbago with sciatica, left side: Secondary | ICD-10-CM | POA: Diagnosis not present

## 2015-04-24 DIAGNOSIS — M5441 Lumbago with sciatica, right side: Secondary | ICD-10-CM | POA: Diagnosis not present

## 2015-04-25 DIAGNOSIS — M5442 Lumbago with sciatica, left side: Secondary | ICD-10-CM | POA: Diagnosis not present

## 2015-04-25 DIAGNOSIS — M5441 Lumbago with sciatica, right side: Secondary | ICD-10-CM | POA: Diagnosis not present

## 2015-04-26 DIAGNOSIS — I10 Essential (primary) hypertension: Secondary | ICD-10-CM | POA: Diagnosis not present

## 2015-04-26 DIAGNOSIS — I252 Old myocardial infarction: Secondary | ICD-10-CM | POA: Diagnosis not present

## 2015-04-26 DIAGNOSIS — I5022 Chronic systolic (congestive) heart failure: Secondary | ICD-10-CM | POA: Diagnosis not present

## 2015-04-26 DIAGNOSIS — Z79899 Other long term (current) drug therapy: Secondary | ICD-10-CM | POA: Diagnosis not present

## 2015-04-26 DIAGNOSIS — E780 Pure hypercholesterolemia: Secondary | ICD-10-CM | POA: Diagnosis not present

## 2015-04-26 DIAGNOSIS — E559 Vitamin D deficiency, unspecified: Secondary | ICD-10-CM | POA: Diagnosis not present

## 2015-04-26 DIAGNOSIS — H6120 Impacted cerumen, unspecified ear: Secondary | ICD-10-CM | POA: Diagnosis not present

## 2015-04-26 DIAGNOSIS — E039 Hypothyroidism, unspecified: Secondary | ICD-10-CM | POA: Diagnosis not present

## 2015-04-26 DIAGNOSIS — Z0001 Encounter for general adult medical examination with abnormal findings: Secondary | ICD-10-CM | POA: Diagnosis not present

## 2015-04-26 DIAGNOSIS — I4891 Unspecified atrial fibrillation: Secondary | ICD-10-CM | POA: Diagnosis not present

## 2015-04-26 DIAGNOSIS — Z1389 Encounter for screening for other disorder: Secondary | ICD-10-CM | POA: Diagnosis not present

## 2015-04-26 DIAGNOSIS — M5430 Sciatica, unspecified side: Secondary | ICD-10-CM | POA: Diagnosis not present

## 2015-04-30 DIAGNOSIS — M5442 Lumbago with sciatica, left side: Secondary | ICD-10-CM | POA: Diagnosis not present

## 2015-04-30 DIAGNOSIS — M5441 Lumbago with sciatica, right side: Secondary | ICD-10-CM | POA: Diagnosis not present

## 2015-05-02 DIAGNOSIS — M5442 Lumbago with sciatica, left side: Secondary | ICD-10-CM | POA: Diagnosis not present

## 2015-05-02 DIAGNOSIS — M5441 Lumbago with sciatica, right side: Secondary | ICD-10-CM | POA: Diagnosis not present

## 2015-05-20 DIAGNOSIS — M5441 Lumbago with sciatica, right side: Secondary | ICD-10-CM | POA: Diagnosis not present

## 2015-05-20 DIAGNOSIS — M5442 Lumbago with sciatica, left side: Secondary | ICD-10-CM | POA: Diagnosis not present

## 2015-05-22 DIAGNOSIS — M5441 Lumbago with sciatica, right side: Secondary | ICD-10-CM | POA: Diagnosis not present

## 2015-05-22 DIAGNOSIS — M5442 Lumbago with sciatica, left side: Secondary | ICD-10-CM | POA: Diagnosis not present

## 2015-05-28 DIAGNOSIS — M5442 Lumbago with sciatica, left side: Secondary | ICD-10-CM | POA: Diagnosis not present

## 2015-05-28 DIAGNOSIS — M5441 Lumbago with sciatica, right side: Secondary | ICD-10-CM | POA: Diagnosis not present

## 2015-06-04 ENCOUNTER — Telehealth: Payer: Self-pay | Admitting: Interventional Cardiology

## 2015-06-04 ENCOUNTER — Encounter: Payer: Self-pay | Admitting: Interventional Cardiology

## 2015-06-04 ENCOUNTER — Encounter: Payer: Self-pay | Admitting: *Deleted

## 2015-06-04 DIAGNOSIS — R202 Paresthesia of skin: Secondary | ICD-10-CM

## 2015-06-04 NOTE — Telephone Encounter (Signed)
Follow up ° ° ° ° °Returning a nurses call °

## 2015-06-04 NOTE — Telephone Encounter (Signed)
Pt states she has had coldness and tingling in her lower extremities for 2-3 weeks, pt denies acute pain.  Pt states coldness and tingling is continuous, it is not worse with exercise or activity, she can sleep at night without problems, nothing seems to make it better or worse.  Pt denies numbness or discoloration in her feet or toes. Pt states she saw Dr Inda Merlin a couple of weeks ago (she was not having these symptoms at that time), had lab done and was told it was [facility].   I reviewed with Richardson Dopp, PAc--per Nicki Reaper -order lower extremity dopplers, obtain lab results from Dr Inda Merlin, keep follow up app scheduled with Cecilie Kicks.   Pt advised, verbalized understanding.

## 2015-06-04 NOTE — Telephone Encounter (Signed)
Left message to call back  

## 2015-06-04 NOTE — Telephone Encounter (Signed)
I spoke with medical records at Dr Inda Merlin' office and requested recent lab results, pt had lab 04/26/15 K =4.4, Dr Inda Merlin' office will fax copy of results to Dr Tamala Julian.

## 2015-06-04 NOTE — Telephone Encounter (Signed)
New Message   Pt husband is calling to schedule an appt and he states that the   Pt is complaining of her legs being cold and tingling, please give pt a call  i schedule appt with Cecilie Kicks on Aug 30th at 8:30am

## 2015-06-06 ENCOUNTER — Ambulatory Visit (HOSPITAL_COMMUNITY)
Admission: RE | Admit: 2015-06-06 | Discharge: 2015-06-06 | Disposition: A | Discharge status: Home / Self Care | Payer: Medicare Other | Source: Ambulatory Visit | Attending: Physician Assistant | Admitting: Physician Assistant

## 2015-06-06 DIAGNOSIS — I10 Essential (primary) hypertension: Secondary | ICD-10-CM | POA: Insufficient documentation

## 2015-06-06 DIAGNOSIS — I2510 Atherosclerotic heart disease of native coronary artery without angina pectoris: Secondary | ICD-10-CM | POA: Insufficient documentation

## 2015-06-06 DIAGNOSIS — R202 Paresthesia of skin: Secondary | ICD-10-CM | POA: Diagnosis not present

## 2015-06-06 DIAGNOSIS — E785 Hyperlipidemia, unspecified: Secondary | ICD-10-CM | POA: Insufficient documentation

## 2015-06-10 ENCOUNTER — Telehealth: Payer: Self-pay | Admitting: *Deleted

## 2015-06-10 NOTE — Telephone Encounter (Signed)
Pt notified by phone with verbal understanding of LEA w/ABI normal.

## 2015-06-17 ENCOUNTER — Ambulatory Visit (INDEPENDENT_AMBULATORY_CARE_PROVIDER_SITE_OTHER): Payer: Medicare Other | Admitting: Cardiology

## 2015-06-17 ENCOUNTER — Encounter: Payer: Self-pay | Admitting: Cardiology

## 2015-06-17 VITALS — BP 142/84 | HR 60 | Ht 66.5 in | Wt 184.7 lb

## 2015-06-17 DIAGNOSIS — R208 Other disturbances of skin sensation: Secondary | ICD-10-CM

## 2015-06-17 DIAGNOSIS — Z9861 Coronary angioplasty status: Secondary | ICD-10-CM | POA: Diagnosis not present

## 2015-06-17 DIAGNOSIS — I2510 Atherosclerotic heart disease of native coronary artery without angina pectoris: Secondary | ICD-10-CM

## 2015-06-17 DIAGNOSIS — R209 Unspecified disturbances of skin sensation: Secondary | ICD-10-CM

## 2015-06-17 NOTE — Patient Instructions (Signed)
Your physician recommends that you schedule a follow-up appointment in March with Dr. Tamala Julian. No changes were  made today in your therapy. Continue all medications as prescribed.

## 2015-06-17 NOTE — Progress Notes (Signed)
06/17/2015 TENNILE STYLES   Aug 15, 1930  [id number]  Primary Physician Henrine Screws, MD Primary Cardiologist: Dr. Tamala Julian   Reason for Visit/CC: Follow-up for Lower Extremity Dopplers Results/ Bilateral LEE Coldness  HPI:  The patient is a 81 year old female, followed by Dr. Tamala Julian.  Her cardiac history is significant for coronary artery disease and ischemic cardiomyopathy. In December 2014, she was admitted for an acute STEMI. Emergent left heart catheterization revealed a 100% occluded LAD, requiring complex PCI with 2 overlapping drug-eluting stents to the LAD. She also had moderate residual RCA disease treated medically. Her hospital course was complicated by cardiogenic shock, ventilator dependent respiratory failure and peri-MI PAF treated with amiodarone.  Her ejection fraction post STEMI was 30-35%. Three-month repeat echo demonstrated improvement in EF to 40-45%. Her other medical problems include hypertension, hyperlipidemia and hypothyroidism. Her last office visit with Dr. Tamala Julian was 01/10/2015. At that time, she denied any recurrent angina and was felt to be stable from a cardiac standpoint. According to office note, Dr. Tamala Julian changed her from Brilinta to Plavix, 75 mg per day. She was continued on aspirin, Crestor, carvedilol and losartan. Dr. Tamala Julian instructed her to follow-up in one year for repeat evaluation.  She now presents back to clinic today with her husband with a chief complaint of bilateral lower extremity coldness. She reports intermittent symptoms but denies any numbness, tingling or weakness. She called the office several weeks ago with her complaints and Dr. Tamala Julian ordered for her to undergo bilateral lower extremity Doppler studies. This was performed in our office on 06/07/2015 and showed no evidence of peripheral arterial disease. ABIs were normal at 1.3 bilaterally. She reports that she was also recently seen by her PCP who conducted laboratory work. She reports that her  workup was negative for anemia and thyroid function studies were normal.  In regards to her CAD, she denies any recent chest pain symptoms. She also denies dyspnea, orthopnea, PND, lower extremity edema, palpitations, lightheadedness, dizziness, syncope/near-syncope. She reports full medication compliance. She denies any melana or abnormal bleeding.    Current Outpatient Prescriptions  Medication Sig Dispense Refill  . acetaminophen (TYLENOL) 500 MG tablet Take 500-1,000 mg by mouth every 6 (six) hours as needed for mild pain.    Marland Kitchen ALPRAZolam (XANAX) 0.25 MG tablet Take 0.25 mg by mouth 2 (two) times daily as needed for anxiety.     Marland Kitchen aspirin 81 MG chewable tablet Chew 1 tablet (81 mg total) by mouth daily.    . Biotin 1000 MCG tablet Take 1,000 mcg by mouth daily.    Marland Kitchen buPROPion (WELLBUTRIN XL) 150 MG 24 hr tablet Take 150 mg by mouth daily.    . calcium-vitamin D (OSCAL WITH D) 500-200 MG-UNIT per tablet Take 1 tablet by mouth daily.     . carvedilol (COREG) 6.25 MG tablet Take 1 tablet (6.25 mg total) by mouth 2 (two) times daily with a meal. 60 tablet 5  . cholecalciferol (VITAMIN D) 1000 UNITS tablet Take 1,000 Units by mouth daily.    . clopidogrel (PLAVIX) 75 MG tablet Take 1 tablet (75 mg total) by mouth daily. 30 tablet 11  . furosemide (LASIX) 20 MG tablet Take 20 mg by mouth daily. Patient taking one half tablet daily.    Marland Kitchen levothyroxine (SYNTHROID, LEVOTHROID) 100 MCG tablet Take 100 mcg by mouth daily before breakfast.    . losartan (COZAAR) 50 MG tablet Take 1 tablet (50 mg total) by mouth daily. 30 tablet 5  . Multiple  Vitamins-Minerals (MULTIVITAMIN WITH MINERALS) tablet Take 1 tablet by mouth daily.    . nitroGLYCERIN (NITROSTAT) 0.4 MG SL tablet Place 1 tablet (0.4 mg total) under the tongue every 5 (five) minutes as needed for chest pain. 25 tablet 6  . omeprazole (PRILOSEC) 20 MG capsule Take 20 mg by mouth daily.     [name] Glycol-Propyl Glycol (SYSTANE OP) Place 1-2  drops into both eyes 3 (three) times daily as needed (for dry eye or irritation).    . rosuvastatin (CRESTOR) 5 MG tablet Take 5 mg by mouth daily.    . sertraline (ZOLOFT) 50 MG tablet Take 50 mg by mouth 2 (two) times daily.     . vitamin E (VITAMIN E) 400 UNIT capsule Take 400 Units by mouth daily.     No current facility-administered medications for this visit.   Facility-Administered Medications Ordered in Other Visits  Medication Dose Route Frequency Provider Last Rate Last Dose  . etomidate (AMIDATE) injection    Anesthesia Intra-op Fulton Reek, MD   14 mg at 10/10/13 0105  . rocuronium [facility]) injection    Anesthesia Intra-op Fulton Reek, MD   50 mg at 10/10/13 0110  . succinylcholine (ANECTINE) injection    Anesthesia Intra-op Fulton Reek, MD   100 mg at 10/10/13 0105    Allergies  Allergen Reactions  . Ace Inhibitors Anaphylaxis  . Beta Adrenergic Blockers Anaphylaxis  . Iohexol Anaphylaxis     Code: HIVES, Desc: anaphylactic shock s/p contrast injection many yrs ago--suggested that pt NEVER have iv contrast//a.c., Onset Date: [id number]   . Atorvastatin Rash  . Latex Rash  . Penicillins Swelling and Rash  . Sulfa Antibiotics Rash    Social History   Social History  . Marital Status: Married    Spouse Name: N/A  . Number of Children: N/A  . Years of Education: N/A   Occupational History  . Not on file.   Social History Main Topics  . Smoking status: Former Research scientist (life sciences)  . Smokeless tobacco: Not on file  . Alcohol Use: No  . Drug Use: No  . Sexual Activity: Not on file   Other Topics Concern  . Not on file   Social History Narrative   Patient is Married, since 1958 [name])   Lives in apartment, Independent Living section at [facility] since 04/2013.   Stopped Smoking many years ago, minimal aalcohol history.   Regular exercise includes swimming 3d/week,   Patient has no Advanced planning documents     Review of  Systems: General: negative for chills, fever, night sweats or weight changes.  Cardiovascular: negative for chest pain, dyspnea on exertion, edema, orthopnea, palpitations, paroxysmal nocturnal dyspnea or shortness of breath Dermatological: negative for rash Respiratory: negative for cough or wheezing Urologic: negative for hematuria Abdominal: negative for nausea, vomiting, diarrhea, bright red blood per rectum, melena, or hematemesis Neurologic: negative for visual changes, syncope, or dizziness All other systems reviewed and are otherwise negative except as noted above.    There were no vitals taken for this visit.  General appearance: alert, cooperative and no distress Neck: no carotid bruit and no JVD Lungs: clear to auscultation bilaterally Heart: regular rate and rhythm, S1, S2 normal, no murmur, click, rub or gallop Extremities: no LEE Pulses: 2+ and symmetric Skin: warm and dry Neurologic: Grossly normal  EKG Not performed  ASSESSMENT AND PLAN:   1. Bilateral lower extremity coldness: Resent LE Doppler studies revealed normal ABIs at 1.3 bilaterally. No evidence for  PVD. She has 2+ distal pulses bilaterally with warmth on exam. She denies lower extremity edema, numbness, tingling or weakness. Per patient report, workup at PCP office was negative for anemia. Thyroid function studies were also normal (on Synthroid for hypothyroidism). No further vascular workup indicated.  2. CAD: History of STEMI in Dec 2014 resulting in PCI to the LAD. Also with residual RCA disease. She denies any recurrent anginal symptoms. Continue medical therapy with aspirin, Plavix, Crestor, Coreg and losartan.  3. Hypertension: Blood pressure is well-controlled on current regimen.  4. Hyperlipidemia: Continue statin therapy with Crestor.  5. Ischemic cardiomyopathy/chronic systolic heart failure: Last assessment of systolic function revealed an EF of 40-45%. She is euvolemic on physical exam. She  denies any symptoms of dyspnea, orthopnea, PND or lower extremity edema. Continue medical therapy with Coreg and losartan.   PLAN  Patient is doing well from a cardiovascular standpoint. Recent lower extremity Doppler studies are negative for peripheral vassar disease. Continue routine follow-up with Dr. Tamala Julian. She is due for repeat follow-up March 2017.  Devin Foskey PA-C 06/17/2015 8:29 AM

## 2015-06-18 ENCOUNTER — Ambulatory Visit: Payer: Medicare Other | Admitting: Cardiology

## 2015-07-12 ENCOUNTER — Encounter: Payer: Self-pay | Admitting: Podiatry

## 2015-07-12 ENCOUNTER — Ambulatory Visit (INDEPENDENT_AMBULATORY_CARE_PROVIDER_SITE_OTHER): Payer: Medicare Other | Admitting: Podiatry

## 2015-07-12 VITALS — BP 138/69 | HR 55 | Resp 16

## 2015-07-12 DIAGNOSIS — I2510 Atherosclerotic heart disease of native coronary artery without angina pectoris: Secondary | ICD-10-CM | POA: Diagnosis not present

## 2015-07-12 DIAGNOSIS — B351 Tinea unguium: Secondary | ICD-10-CM | POA: Diagnosis not present

## 2015-07-12 DIAGNOSIS — M79673 Pain in unspecified foot: Secondary | ICD-10-CM | POA: Diagnosis not present

## 2015-07-12 DIAGNOSIS — Z9861 Coronary angioplasty status: Secondary | ICD-10-CM | POA: Diagnosis not present

## 2015-07-12 DIAGNOSIS — M79609 Pain in unspecified limb: Principal | ICD-10-CM

## 2015-07-12 NOTE — Progress Notes (Signed)
Subjective:     Patient ID: Melinda Noble, female   DOB: 02/15/1930, 81 y.o.   MRN: [id number]  HPIThis patient presents to the office with complaint of second toenail right foot cracking into two.  She says this nail then catches her socks and causes pain.  She desires to be evaluated for this nails problem as well as her fungus nails both feet.   Review of Systems     Objective:   Physical Exam GENERAL APPEARANCE: Alert, conversant. Appropriately groomed. No acute distress.  VASCULAR: Pedal pulses palpable at  [facility] and PT bilateral.  Capillary refill time is immediate to all digits,  Normal temperature gradient.  Digital hair growth is present bilateral  NEUROLOGIC: sensation is normal to 5.07 monofilament at 5/5 sites bilateral.  Light touch is intact bilateral, Muscle strength normal.  MUSCULOSKELETAL: acceptable muscle strength, tone and stability bilateral.  Intrinsic muscluature intact bilateral.  Rectus appearance of foot and digits noted bilateral.   DERMATOLOGIC: skin color, texture, and turgor are within normal limits.  No preulcerative lesions or ulcers  are seen, no interdigital maceration noted.  No open lesions present.  . No drainage noted. NAILS  She has thick disfigured discolored nails both feet.  Her toenail second toe is stunted in growth along lateral border.  No signs of redness or infection or paronychia.      Assessment:     Onychomycosis  B/L     Plan:     IE  Debride nails B/L.  RTC 12 weeks

## 2015-08-06 ENCOUNTER — Other Ambulatory Visit: Payer: Self-pay

## 2015-08-06 DIAGNOSIS — Z1231 Encounter for screening mammogram for malignant neoplasm of breast: Secondary | ICD-10-CM

## 2015-08-08 DIAGNOSIS — Z23 Encounter for immunization: Secondary | ICD-10-CM | POA: Diagnosis not present

## 2015-09-05 ENCOUNTER — Ambulatory Visit
Admission: RE | Admit: 2015-09-05 | Discharge: 2015-09-05 | Disposition: A | Discharge status: Home / Self Care | Payer: Medicare Other | Source: Ambulatory Visit

## 2015-09-05 DIAGNOSIS — Z1231 Encounter for screening mammogram for malignant neoplasm of breast: Secondary | ICD-10-CM | POA: Diagnosis not present

## 2015-09-11 DIAGNOSIS — L821 Other seborrheic keratosis: Secondary | ICD-10-CM | POA: Diagnosis not present

## 2015-09-11 DIAGNOSIS — L814 Other melanin hyperpigmentation: Secondary | ICD-10-CM | POA: Diagnosis not present

## 2015-09-11 DIAGNOSIS — L720 Epidermal cyst: Secondary | ICD-10-CM | POA: Diagnosis not present

## 2015-09-11 DIAGNOSIS — D2271 Melanocytic nevi of right lower limb, including hip: Secondary | ICD-10-CM | POA: Diagnosis not present

## 2015-09-11 DIAGNOSIS — D1801 Hemangioma of skin and subcutaneous tissue: Secondary | ICD-10-CM | POA: Diagnosis not present

## 2015-09-11 DIAGNOSIS — L239 Allergic contact dermatitis, unspecified cause: Secondary | ICD-10-CM | POA: Diagnosis not present

## 2015-09-11 DIAGNOSIS — Z85828 Personal history of other malignant neoplasm of skin: Secondary | ICD-10-CM | POA: Diagnosis not present

## 2015-09-11 DIAGNOSIS — L918 Other hypertrophic disorders of the skin: Secondary | ICD-10-CM | POA: Diagnosis not present

## 2015-10-03 ENCOUNTER — Ambulatory Visit (INDEPENDENT_AMBULATORY_CARE_PROVIDER_SITE_OTHER): Payer: Medicare Other | Admitting: Podiatry

## 2015-10-03 ENCOUNTER — Encounter: Payer: Self-pay | Admitting: Podiatry

## 2015-10-03 DIAGNOSIS — M79673 Pain in unspecified foot: Secondary | ICD-10-CM | POA: Diagnosis not present

## 2015-10-03 DIAGNOSIS — B351 Tinea unguium: Secondary | ICD-10-CM | POA: Diagnosis not present

## 2015-10-03 DIAGNOSIS — M79609 Pain in unspecified limb: Principal | ICD-10-CM

## 2015-10-03 NOTE — Progress Notes (Signed)
Subjective:     Patient ID: Melinda Noble, female   DOB: [birth year], 81 y.o.   MRN: [id number]  HPIThis patient presents to the office for preventive foot care services.  She says her nails have grown long and thick since her last visit.  She says her nails are painful walking and wearing shoes.  She presents for evaluatiion and treatment.   Review of Systems     Objective:   Physical Exam GENERAL APPEARANCE: Alert, conversant. Appropriately groomed. No acute distress.  VASCULAR: Pedal pulses palpable at  [facility] and PT bilateral.  Capillary refill time is immediate to all digits,  Normal temperature gradient.  Digital hair growth is present bilateral  NEUROLOGIC: sensation is normal to 5.07 monofilament at 5/5 sites bilateral.  Light touch is intact bilateral, Muscle strength normal.  MUSCULOSKELETAL: acceptable muscle strength, tone and stability bilateral.  Intrinsic muscluature intact bilateral.  Rectus appearance of foot and digits noted bilateral.   DERMATOLOGIC: skin color, texture, and turgor are within normal limits.  No preulcerative lesions or ulcers  are seen, no interdigital maceration noted.  No open lesions present.  . No drainage noted. NAILS  She has thick disfigured discolored nails both feet.        Assessment:     Onychomycosis  B/L     Plan:     IE  Debride nails B/L.  RTC 12 weeks   Gardiner Barefoot DPM

## 2015-10-07 DIAGNOSIS — H6121 Impacted cerumen, right ear: Secondary | ICD-10-CM | POA: Diagnosis not present

## 2015-10-31 DIAGNOSIS — I10 Essential (primary) hypertension: Secondary | ICD-10-CM | POA: Diagnosis not present

## 2015-10-31 DIAGNOSIS — I5022 Chronic systolic (congestive) heart failure: Secondary | ICD-10-CM | POA: Diagnosis not present

## 2015-10-31 DIAGNOSIS — I252 Old myocardial infarction: Secondary | ICD-10-CM | POA: Diagnosis not present

## 2015-10-31 DIAGNOSIS — K219 Gastro-esophageal reflux disease without esophagitis: Secondary | ICD-10-CM | POA: Diagnosis not present

## 2015-10-31 DIAGNOSIS — H6120 Impacted cerumen, unspecified ear: Secondary | ICD-10-CM | POA: Diagnosis not present

## 2015-10-31 DIAGNOSIS — E559 Vitamin D deficiency, unspecified: Secondary | ICD-10-CM | POA: Diagnosis not present

## 2015-10-31 DIAGNOSIS — E039 Hypothyroidism, unspecified: Secondary | ICD-10-CM | POA: Diagnosis not present

## 2015-10-31 DIAGNOSIS — E7800 Pure hypercholesterolemia, unspecified: Secondary | ICD-10-CM | POA: Diagnosis not present

## 2015-10-31 DIAGNOSIS — I4891 Unspecified atrial fibrillation: Secondary | ICD-10-CM | POA: Diagnosis not present

## 2015-10-31 DIAGNOSIS — J301 Allergic rhinitis due to pollen: Secondary | ICD-10-CM | POA: Diagnosis not present

## 2015-10-31 DIAGNOSIS — M5430 Sciatica, unspecified side: Secondary | ICD-10-CM | POA: Diagnosis not present

## 2015-10-31 DIAGNOSIS — I700 Atherosclerosis of aorta: Secondary | ICD-10-CM | POA: Diagnosis not present

## 2015-11-04 DIAGNOSIS — M545 Low back pain: Secondary | ICD-10-CM | POA: Diagnosis not present

## 2015-11-04 DIAGNOSIS — M25551 Pain in right hip: Secondary | ICD-10-CM | POA: Diagnosis not present

## 2015-11-04 DIAGNOSIS — M5431 Sciatica, right side: Secondary | ICD-10-CM | POA: Diagnosis not present

## 2015-11-07 DIAGNOSIS — M5431 Sciatica, right side: Secondary | ICD-10-CM | POA: Diagnosis not present

## 2015-11-07 DIAGNOSIS — M545 Low back pain: Secondary | ICD-10-CM | POA: Diagnosis not present

## 2015-11-07 DIAGNOSIS — M25551 Pain in right hip: Secondary | ICD-10-CM | POA: Diagnosis not present

## 2015-11-09 ENCOUNTER — Other Ambulatory Visit: Payer: Self-pay | Admitting: Interventional Cardiology

## 2015-11-15 DIAGNOSIS — M25551 Pain in right hip: Secondary | ICD-10-CM | POA: Diagnosis not present

## 2015-11-15 DIAGNOSIS — M545 Low back pain: Secondary | ICD-10-CM | POA: Diagnosis not present

## 2015-11-15 DIAGNOSIS — M5431 Sciatica, right side: Secondary | ICD-10-CM | POA: Diagnosis not present

## 2015-11-19 DIAGNOSIS — M5431 Sciatica, right side: Secondary | ICD-10-CM | POA: Diagnosis not present

## 2015-11-19 DIAGNOSIS — M545 Low back pain: Secondary | ICD-10-CM | POA: Diagnosis not present

## 2015-11-19 DIAGNOSIS — M25551 Pain in right hip: Secondary | ICD-10-CM | POA: Diagnosis not present

## 2015-11-21 DIAGNOSIS — M5431 Sciatica, right side: Secondary | ICD-10-CM | POA: Diagnosis not present

## 2015-11-21 DIAGNOSIS — M25551 Pain in right hip: Secondary | ICD-10-CM | POA: Diagnosis not present

## 2015-11-21 DIAGNOSIS — M545 Low back pain: Secondary | ICD-10-CM | POA: Diagnosis not present

## 2015-12-02 DIAGNOSIS — I10 Essential (primary) hypertension: Secondary | ICD-10-CM | POA: Diagnosis not present

## 2015-12-02 DIAGNOSIS — R739 Hyperglycemia, unspecified: Secondary | ICD-10-CM | POA: Diagnosis not present

## 2015-12-02 DIAGNOSIS — E559 Vitamin D deficiency, unspecified: Secondary | ICD-10-CM | POA: Diagnosis not present

## 2015-12-02 DIAGNOSIS — H6120 Impacted cerumen, unspecified ear: Secondary | ICD-10-CM | POA: Diagnosis not present

## 2015-12-02 DIAGNOSIS — K219 Gastro-esophageal reflux disease without esophagitis: Secondary | ICD-10-CM | POA: Diagnosis not present

## 2015-12-02 DIAGNOSIS — Z79899 Other long term (current) drug therapy: Secondary | ICD-10-CM | POA: Diagnosis not present

## 2015-12-02 DIAGNOSIS — E7800 Pure hypercholesterolemia, unspecified: Secondary | ICD-10-CM | POA: Diagnosis not present

## 2015-12-31 ENCOUNTER — Encounter: Payer: Self-pay | Admitting: Interventional Cardiology

## 2015-12-31 ENCOUNTER — Ambulatory Visit (INDEPENDENT_AMBULATORY_CARE_PROVIDER_SITE_OTHER): Payer: Medicare Other | Admitting: Interventional Cardiology

## 2015-12-31 VITALS — BP 132/66 | HR 57 | Ht 66.0 in | Wt 191.1 lb

## 2015-12-31 DIAGNOSIS — I25119 Atherosclerotic heart disease of native coronary artery with unspecified angina pectoris: Secondary | ICD-10-CM | POA: Diagnosis not present

## 2015-12-31 DIAGNOSIS — I5022 Chronic systolic (congestive) heart failure: Secondary | ICD-10-CM | POA: Diagnosis not present

## 2015-12-31 DIAGNOSIS — I480 Paroxysmal atrial fibrillation: Secondary | ICD-10-CM | POA: Diagnosis not present

## 2015-12-31 DIAGNOSIS — Z09 Encounter for follow-up examination after completed treatment for conditions other than malignant neoplasm: Secondary | ICD-10-CM | POA: Diagnosis not present

## 2015-12-31 DIAGNOSIS — Z9229 Personal history of other drug therapy: Secondary | ICD-10-CM

## 2015-12-31 DIAGNOSIS — E785 Hyperlipidemia, unspecified: Secondary | ICD-10-CM

## 2015-12-31 DIAGNOSIS — I10 Essential (primary) hypertension: Secondary | ICD-10-CM

## 2015-12-31 NOTE — Patient Instructions (Addendum)
Medication Instructions:  Your physician recommends that you continue on your current medications as directed. Please refer to the Current Medication list given to you today.   Labwork: None ordered  Testing/Procedures: None ordered  Follow-Up: Your physician wants you to follow-up in: 1 year You will receive a reminder letter in the mail two months in advance. If you don't receive a letter, please call our office to schedule the follow-up appointment.   Any Other Special Instructions Will Be Listed Below (If Applicable). We will call you to let you know if Plavix can be discontinued     If you need a refill on your cardiac medications before your next appointment, please call your pharmacy.

## 2015-12-31 NOTE — Progress Notes (Signed)
Cardiology Office Note   Date:  12/31/2015   ID:  Melinda Noble, DOB [birth year], MRN [id number]  PCP:  Henrine Screws, MD  Cardiologist:  Sinclair Grooms, MD   Chief Complaint  Patient presents with  . Coronary Artery Disease      History of Present Illness: Melinda Noble is a 82 y.o. female who presents for Follow-up of coronary disease, overlapping long segment of stents in the mid to distal LAD postdilated to 3.0 2014 during STEMI, chronic combined systolic and diastolic heart failure, hyperlipidemia, paroxysmal atrial fibrillation, and essential hypertension.  Clarity is doing relatively well. Physical activity is being limited by right knee discomfort. She has no orthopnea, PND, or lower extremity swelling. She has an occasional flutter in her chest. This flutter is momentary. There are no associated symptoms otherwise.    Past Medical History  Diagnosis Date  . Hypertension   . Migraine headache     a. Remotely.  Marland Kitchen Hypothyroidism   . Cardiogenic shock ([place])     a. 09/2013 - during admission for STEMI.  . H/O Acute respiratory failure due to hypoxia 10/10/2013    a. 09/2013 - secondary to anterior STEMI related pulmonary edema (resolved).  . CAD S/P percutaneous coronary angioplasty 10/09/13    DES x 2 to Mid LAD, 2.5 x 38, 2.5 x 12 Promus (covering extensive disection), moderate RCA disesase  . Ischemic cardiomyopathy     a. EF 30-35% during 09/2013 adm for STEMI. b. Repeat Echo EF 40-45% with distal LAD hypokinesis (10/16/14 following STEMI recovery).  Marland Kitchen CAD (coronary artery disease)     a. STEMI 10/09/13 - 100% LAD - following initial angioplasty, ? Spontaneous dissection vs related to ballon. s/p complex PCI with 2 overlapping DES to LAD. Adm complicated by VDRF, cardiogenic shock, peri-MI PAF with NSR on amiodarone. Residual mod RCA disease.   Marland Kitchen Dyslipidemia   . PAF (paroxysmal atrial fibrillation) ([place])     a. 09/2013 - peri-MI, NSR at discharge on amiodarone.    . Orthostatic hypotension     a. Adm 10/2013 for this, felt due to Lasix. Not on regular diuretic due to this/dehydration.  . Sinus bradycardia   . Anemia   . Depression     Past Surgical History  Procedure Laterality Date  . Shoulder surgery    . Coronary angioplasty with stent placement  10/09/13    2 overlapping Promus DES to mid LAD, occlusion/dissection (2.5 x 38, 2.5 x 12)  . Left heart catheterization with coronary angiogram N/A 10/09/2013    Procedure: LEFT HEART CATHETERIZATION WITH CORONARY ANGIOGRAM;  Surgeon: Leonie Man, MD;  [facility];  Service: Cardiovascular;  Laterality: N/A;     Current Outpatient Prescriptions  Medication Sig Dispense Refill  . acetaminophen (TYLENOL) 500 MG tablet Take 500-1,000 mg by mouth every 6 (six) hours as needed for mild pain.    Marland Kitchen ALPRAZolam (XANAX) 0.25 MG tablet Take 0.25 mg by mouth 2 (two) times daily as needed for anxiety.     Marland Kitchen aspirin 81 MG chewable tablet Chew 1 tablet (81 mg total) by mouth daily.    . Biotin 1000 MCG tablet Take 1,000 mcg by mouth daily.    Marland Kitchen buPROPion (WELLBUTRIN XL) 150 MG 24 hr tablet Take 150 mg by mouth daily.    . calcium-vitamin D (OSCAL WITH D) 500-200 MG-UNIT per tablet Take 1 tablet by mouth daily.     . carvedilol (COREG) 6.25 MG tablet  Take 1 tablet (6.25 mg total) by mouth 2 (two) times daily with a meal. 60 tablet 5  . cholecalciferol (VITAMIN D) 1000 UNITS tablet Take 1,000 Units by mouth daily.    . clobetasol ointment (TEMOVATE) [id number] % Apply 1 application topically 2 (two) times daily as needed. (affected areas)  4  . clopidogrel (PLAVIX) 75 MG tablet Take 1 tablet (75 mg total) by mouth daily. 90 tablet 1  . furosemide (LASIX) 20 MG tablet Take 20 mg by mouth daily. Patient taking one half tablet daily.    Marland Kitchen levothyroxine (SYNTHROID, LEVOTHROID) 100 MCG tablet Take 100 mcg by mouth daily before breakfast.    . losartan (COZAAR) 50 MG tablet Take 1 tablet (50 mg total) by mouth  daily. 30 tablet 5  . Multiple Vitamins-Minerals (MULTIVITAMIN WITH MINERALS) tablet Take 1 tablet by mouth daily.    . nitroGLYCERIN (NITROSTAT) 0.4 MG SL tablet Place 1 tablet (0.4 mg total) under the tongue every 5 (five) minutes as needed for chest pain. 25 tablet 6  . omeprazole (PRILOSEC) 20 MG capsule Take 20 mg by mouth daily.     [name] Glycol-Propyl Glycol (SYSTANE OP) Place 1-2 drops into both eyes 3 (three) times daily as needed (for dry eye or irritation).    . rosuvastatin (CRESTOR) 5 MG tablet Take 5 mg by mouth daily.    . sertraline (ZOLOFT) 50 MG tablet Take 50 mg by mouth 2 (two) times daily.     . vitamin E (VITAMIN E) 400 UNIT capsule Take 400 Units by mouth daily.     No current facility-administered medications for this visit.   Facility-Administered Medications Ordered in Other Visits  Medication Dose Route Frequency Provider Last Rate Last Dose  . etomidate (AMIDATE) injection    Anesthesia Intra-op Fulton Reek, MD   14 mg at 10/10/13 0105  . rocuronium [facility]) injection    Anesthesia Intra-op Fulton Reek, MD   50 mg at 10/10/13 0110  . succinylcholine (ANECTINE) injection    Anesthesia Intra-op Fulton Reek, MD   100 mg at 10/10/13 0105    Allergies:   Ace inhibitors; Beta adrenergic blockers; Iohexol; Atorvastatin; Latex; Penicillins; and Sulfa antibiotics    Social History:  The patient  reports that she has quit smoking. She has never used smokeless tobacco. She reports that she does not drink alcohol or use illicit drugs.   Family History:  The patient's family history includes Anemia in her mother; Heart attack in her father; Heart failure in her mother; Varicose Veins in her father.    ROS:  Please see the history of present illness.   Otherwise, review of systems are positive for Dyspnea with activity, leg pain, particularly the right knee, flutter in the chest, dizziness, easy bruising..   All other systems are reviewed and negative.     PHYSICAL EXAM: VS:  BP 132/66 mmHg  Pulse 57  Ht 5\' 6"  (1.676 m)  Wt 191 lb 1.9 oz (86.691 kg)  BMI 30.86 kg/m2 , BMI Body mass index is 30.86 kg/(m^2). GEN: Well nourished, well developed, in no acute distress HEENT: normal Neck: no JVD, carotid bruits, or masses Cardiac: RRR.  There is no murmur, rub, or gallop. There is no edema. Respiratory:  clear to auscultation bilaterally, normal work of breathing. GI: soft, nontender, nondistended, + BS MS: no deformity or atrophy Skin: warm and dry, no rash Neuro:  Strength and sensation are intact Psych: euthymic mood, full affect   EKG:  EKG  is not ordered today.    Recent Labs: No results found for requested labs within last 365 days.    Lipid Panel    Component Value Date/Time   CHOL 98 10/23/2013   TRIG 130 10/23/2013   HDL 33* 10/23/2013   CHOLHDL 3.8 10/10/2013 0430   VLDL 15 10/10/2013 0430   LDLCALC 46 10/23/2013      Wt Readings from Last 3 Encounters:  12/31/15 191 lb 1.9 oz (86.691 kg)  06/17/15 184 lb 11.2 oz (83.779 kg)  01/10/15 172 lb 6.4 oz (78.2 kg)      Other studies Reviewed: Additional studies/ records that were reviewed today include: Reviewed the intervention report.The findings include a second-generation DES, Promus Premier, was used..    ASSESSMENT AND PLAN:  1. Atherosclerosis of native coronary artery of native heart with angina pectoris ([place]) We need to determine if Plavix can be discontinued. at this point, now 2 years post-PCI, Plavix can likely be discontinued.  2. Chronic systolic heart failure (H[facility]) No evidence of volume overload  3. PAF (paroxysmal atrial fibrillation) ([facility]) No specific complaints to suggest recurrence   4. H/O amiodarone therapy No documented recurrence of atrial fibrillation off amiodarone  5. Dyslipidemia Followed by primary care total cholesterol 156 recently  6. Essential hypertension Controlled    Current medicines are reviewed at length  with the patient today.  The patient has the following concerns regarding medicines: We'll make a decision about whether Plavix should be continued. .   The following changes/actions have been instituted:After review, I believe Plavix can be discontinued.  Otherwise no change  Labs/ tests ordered today include:  No orders of the defined types were placed in this encounter.     Disposition:   FU with HS in 1 year  Signed, Sinclair Grooms, MD  12/31/2015 12:23 PM    [address], [place],   [address] Phone: [phone]; Fax: [phone]

## 2016-01-01 ENCOUNTER — Telehealth: Payer: Self-pay

## 2016-01-01 NOTE — Telephone Encounter (Signed)
-----   Message from Belva Crome, MD sent at 12/31/2015  1:37 PM EDT ----- Left the patient know that she can discontinue Plavix.

## 2016-01-01 NOTE — Telephone Encounter (Signed)
Pt aware of Dr.Smith's instructions. let the patient know that she can discontinue Plavix. Pt adv to continue all other medications as prescribed. Pt med list update. Pt voice appreciation for the call and verbalized understanding.

## 2016-01-02 ENCOUNTER — Ambulatory Visit: Payer: Medicare Other | Admitting: Podiatry

## 2016-01-04 ENCOUNTER — Telehealth: Payer: Self-pay | Admitting: Interventional Cardiology

## 2016-01-04 NOTE — Telephone Encounter (Signed)
Okay to stop plavix.

## 2016-04-22 DIAGNOSIS — H26493 Other secondary cataract, bilateral: Secondary | ICD-10-CM | POA: Diagnosis not present

## 2016-04-22 DIAGNOSIS — H02834 Dermatochalasis of left upper eyelid: Secondary | ICD-10-CM | POA: Diagnosis not present

## 2016-04-22 DIAGNOSIS — H02831 Dermatochalasis of right upper eyelid: Secondary | ICD-10-CM | POA: Diagnosis not present

## 2016-04-22 DIAGNOSIS — H31092 Other chorioretinal scars, left eye: Secondary | ICD-10-CM | POA: Diagnosis not present

## 2016-04-22 DIAGNOSIS — H04123 Dry eye syndrome of bilateral lacrimal glands: Secondary | ICD-10-CM | POA: Diagnosis not present

## 2016-04-22 DIAGNOSIS — H353131 Nonexudative age-related macular degeneration, bilateral, early dry stage: Secondary | ICD-10-CM | POA: Diagnosis not present

## 2016-04-22 DIAGNOSIS — H10413 Chronic giant papillary conjunctivitis, bilateral: Secondary | ICD-10-CM | POA: Diagnosis not present

## 2016-04-22 DIAGNOSIS — Z961 Presence of intraocular lens: Secondary | ICD-10-CM | POA: Diagnosis not present

## 2016-05-04 ENCOUNTER — Encounter: Payer: Self-pay | Admitting: Physician Assistant

## 2016-05-04 ENCOUNTER — Encounter (HOSPITAL_COMMUNITY): Payer: Self-pay | Admitting: Emergency Medicine

## 2016-05-04 ENCOUNTER — Emergency Department (HOSPITAL_COMMUNITY): Payer: Medicare Other

## 2016-05-04 ENCOUNTER — Emergency Department (HOSPITAL_COMMUNITY)
Admission: EM | Admit: 2016-05-04 | Discharge: 2016-05-04 | Disposition: A | Discharge status: Home / Self Care | Payer: Medicare Other | Attending: Emergency Medicine | Admitting: Emergency Medicine

## 2016-05-04 DIAGNOSIS — I10 Essential (primary) hypertension: Secondary | ICD-10-CM | POA: Insufficient documentation

## 2016-05-04 DIAGNOSIS — Z87891 Personal history of nicotine dependence: Secondary | ICD-10-CM | POA: Insufficient documentation

## 2016-05-04 DIAGNOSIS — R0789 Other chest pain: Secondary | ICD-10-CM | POA: Insufficient documentation

## 2016-05-04 DIAGNOSIS — Z91040 Latex allergy status: Secondary | ICD-10-CM | POA: Insufficient documentation

## 2016-05-04 DIAGNOSIS — Z7982 Long term (current) use of aspirin: Secondary | ICD-10-CM | POA: Insufficient documentation

## 2016-05-04 DIAGNOSIS — Z79899 Other long term (current) drug therapy: Secondary | ICD-10-CM | POA: Insufficient documentation

## 2016-05-04 DIAGNOSIS — R079 Chest pain, unspecified: Secondary | ICD-10-CM | POA: Diagnosis not present

## 2016-05-04 LAB — CBC
HEMATOCRIT: 44.1 % (ref 36.0–46.0)
Hemoglobin: 14 g/dL (ref 12.0–15.0)
MCH: 30 pg (ref 26.0–34.0)
MCHC: 31.7 g/dL (ref 30.0–36.0)
MCV: 94.6 fL (ref 78.0–100.0)
Platelets: 170 10*3/uL (ref 150–400)
RBC: 4.66 MIL/uL (ref 3.87–5.11)
RDW: 13.7 % (ref 11.5–15.5)
WBC: 6.9 10*3/uL (ref 4.0–10.5)

## 2016-05-04 LAB — BASIC METABOLIC PANEL
Anion gap: 5 (ref 5–15)
BUN: 20 mg/dL (ref 6–20)
CHLORIDE: 107 mmol/L (ref 101–111)
CO2: 25 mmol/L (ref 22–32)
Calcium: 10.5 mg/dL — ABNORMAL HIGH (ref 8.9–10.3)
Creatinine, Ser: 0.85 mg/dL (ref 0.44–1.00)
GFR calc Af Amer: >60 mL/min (ref >60)
GFR calc non Af Amer: >60 mL/min (ref >60)
GLUCOSE: 109 mg/dL — AB (ref 65–99)
POTASSIUM: 4.4 mmol/L (ref 3.5–5.1)
Sodium: 137 mmol/L (ref 135–145)

## 2016-05-04 LAB — I-STAT TROPONIN, ED
TROPONIN I, POC: 0.02 ng/mL (ref 0.00–0.08)
TROPONIN I, POC: 0.01 ng/mL (ref 0.00–0.08)

## 2016-05-04 NOTE — ED Notes (Signed)
Pt here with pain under left breast starting at 0900 today. Pt denies any other symptoms and sts "It feels like muscle pain. I am here because my husband insisted." Pt a/o x 4. Pt has cardiac history.

## 2016-05-04 NOTE — ED Notes (Signed)
Doctor at bedside.

## 2016-05-04 NOTE — Progress Notes (Signed)
Rec'd request for ER follow-up - patient discharged from ER. Sent message to schedulers to call pt for post-ER f/u. Siniyah Evangelist PA-C

## 2016-05-04 NOTE — ED Provider Notes (Signed)
CSN: [id number]     Arrival date & time 05/04/16  1114 History   First MD Initiated Contact with Patient 05/04/16 1204     Chief Complaint  Patient presents with  . Chest Pain     (Consider location/radiation/quality/duration/timing/severity/associated sxs/prior Treatment) HPI Comments: BETZABE LIAS is a 82 y.o. female who presents with cc of chest pain. PMHx of CAD s/p PCI, combined systolic and diastolic heart failure, hyperlipidemia, paroxysmal atrial fibrillation, and hypertension. Pt comes in with cc of L sided lower chest pain. Chest pain is on at the rib 9-10 area, and it is intermittent. Pain is described as dull pain, and it lasts for a few minutes and is alleviated with palpation of that area. Pt has no chest discomfort with exertion. Pt has no cough, no dib, no nausea or doiphoresis. Pain has no specific aggravating or relieving factor and no evoking factor, and it comes every 5-20 min. No trauma, falls. Pt didn't do any strenuous work yday. No new meds. PT was stopped on plavix in March.   ROS 10 Systems reviewed and are negative for acute change except as noted in the HPI.     Patient is a 82 y.o. female presenting with chest pain. The history is provided by the patient.  Chest Pain   Past Medical History  Diagnosis Date  . Hypertension   . Migraine headache     a. Remotely.  Marland Kitchen Hypothyroidism   . Cardiogenic shock ([place])     a. 09/2013 - during admission for STEMI.  . H/O Acute respiratory failure due to hypoxia 10/10/2013    a. 09/2013 - secondary to anterior STEMI related pulmonary edema (resolved).  . CAD S/P percutaneous coronary angioplasty 10/09/13    DES x 2 to Mid LAD, 2.5 x 38, 2.5 x 12 Promus (covering extensive disection), moderate RCA disesase  . Ischemic cardiomyopathy     a. EF 30-35% during 09/2013 adm for STEMI. b. Repeat Echo EF 40-45% with distal LAD hypokinesis (10/16/14 following STEMI recovery).  Marland Kitchen CAD (coronary artery disease)     a. STEMI  10/09/13 - 100% LAD - following initial angioplasty, ? Spontaneous dissection vs related to ballon. s/p complex PCI with 2 overlapping DES to LAD. Adm complicated by VDRF, cardiogenic shock, peri-MI PAF with NSR on amiodarone. Residual mod RCA disease.   Marland Kitchen Dyslipidemia   . PAF (paroxysmal atrial fibrillation) ([place])     a. 09/2013 - peri-MI, NSR at discharge on amiodarone.  . Orthostatic hypotension     a. Adm 10/2013 for this, felt due to Lasix. Not on regular diuretic due to this/dehydration.  . Sinus bradycardia   . Anemia   . Depression    Past Surgical History  Procedure Laterality Date  . Shoulder surgery    . Coronary angioplasty with stent placement  10/09/13    2 overlapping Promus DES to mid LAD, occlusion/dissection (2.5 x 38, 2.5 x 12)  . Left heart catheterization with coronary angiogram N/A 10/09/2013    Procedure: LEFT HEART CATHETERIZATION WITH CORONARY ANGIOGRAM;  Surgeon: Leonie Man, MD;  Location: [facility] CATH LAB;  Service: Cardiovascular;  Laterality: N/A;   Family History  Problem Relation Age of Onset  . Varicose Veins Father   . Heart attack Father   . Heart failure Mother   . Anemia Mother    Social History  Substance Use Topics  . Smoking status: Former Research scientist (life sciences)  . Smokeless tobacco: Never Used  . Alcohol Use: No  OB History    No data available     Review of Systems  Cardiovascular: Positive for chest pain.      Allergies  Ace inhibitors; Beta adrenergic blockers; Iohexol; Atorvastatin; Latex; Penicillins; and Sulfa antibiotics  Home Medications   Prior to Admission medications   Medication Sig Start Date End Date Taking? Authorizing Provider  acetaminophen (TYLENOL) 500 MG tablet Take 500-1,000 mg by mouth every 6 (six) hours as needed for mild pain.    Historical Provider, MD  ALPRAZolam Duanne Moron) 0.25 MG tablet Take 0.25 mg by mouth 2 (two) times daily as needed for anxiety.  10/26/13   Mardene Celeste, NP  aspirin 81 MG chewable tablet Chew  1 tablet (81 mg total) by mouth daily. 10/17/13   Brittainy Erie Noe, PA-C  Biotin 1000 MCG tablet Take 1,000 mcg by mouth daily.    Historical Provider, MD  buPROPion (WELLBUTRIN XL) 150 MG 24 hr tablet Take 150 mg by mouth daily.    Historical Provider, MD  calcium-vitamin D (OSCAL WITH D) 500-200 MG-UNIT per tablet Take 1 tablet by mouth daily.     Historical Provider, MD  carvedilol (COREG) 6.25 MG tablet Take 1 tablet (6.25 mg total) by mouth 2 (two) times daily with a meal. 04/09/14   Leonie Man, MD  cholecalciferol (VITAMIN D) 1000 UNITS tablet Take 1,000 Units by mouth daily.    Historical Provider, MD  clobetasol ointment (TEMOVATE) [id number] % Apply 1 application topically 2 (two) times daily as needed. (affected areas) 12/12/15   Historical Provider, MD  furosemide (LASIX) 20 MG tablet Take 20 mg by mouth daily. Patient taking one half tablet daily.    Historical Provider, MD  levothyroxine (SYNTHROID, LEVOTHROID) 100 MCG tablet Take 100 mcg by mouth daily before breakfast.    Historical Provider, MD  losartan (COZAAR) 50 MG tablet Take 1 tablet (50 mg total) by mouth daily. 04/30/14   Belva Crome, MD  Multiple Vitamins-Minerals (MULTIVITAMIN WITH MINERALS) tablet Take 1 tablet by mouth daily.    Historical Provider, MD  nitroGLYCERIN (NITROSTAT) 0.4 MG SL tablet Place 1 tablet (0.4 mg total) under the tongue every 5 (five) minutes as needed for chest pain. 11/22/13   Leonie Man, MD  omeprazole (PRILOSEC) 20 MG capsule Take 20 mg by mouth daily.     Historical Provider, MD  Polyethyl Glycol-Propyl Glycol (SYSTANE OP) Place 1-2 drops into both eyes 3 (three) times daily as needed (for dry eye or irritation).    Historical Provider, MD  rosuvastatin (CRESTOR) 5 MG tablet Take 5 mg by mouth daily.    Historical Provider, MD  sertraline (ZOLOFT) 50 MG tablet Take 50 mg by mouth 2 (two) times daily.     Historical Provider, MD  vitamin E (VITAMIN E) 400 UNIT capsule Take 400 Units by mouth  daily.    Historical Provider, MD   BP 167/80 mmHg  Pulse 56  Temp(Src) 98.8 F (37.1 C) (Oral)  Resp 18  Ht 5' 6.25" (1.683 m)  Wt 194 lb (87.998 kg)  BMI 31.07 kg/m2  SpO2 96% Physical Exam  Constitutional: She is oriented to person, place, and time. She appears well-developed.  HENT:  Head: Normocephalic and atraumatic.  Eyes: Conjunctivae and EOM are normal. Pupils are equal, round, and reactive to light.  Neck: Normal range of motion. Neck supple.  Cardiovascular: Normal rate, regular rhythm and normal heart sounds.   Pulmonary/Chest: Effort normal and breath sounds normal. No respiratory distress. She  exhibits no tenderness.  No rash  Abdominal: Soft. Bowel sounds are normal. She exhibits no distension and no mass. There is no tenderness. There is no rebound and no guarding.  Neurological: She is alert and oriented to person, place, and time.  Skin: Skin is warm and dry.  Nursing note and vitals reviewed.   ED Course  Procedures (including critical care time) Labs Review Labs Reviewed  BASIC METABOLIC PANEL - Abnormal; Notable for the following:    Glucose, Bld 109 (*)    Calcium 10.5 (*)    All other components within normal limits  CBC  I-STAT TROPOININ, ED  I-STAT TROPOININ, ED    Imaging Review No results found. I have personally reviewed and evaluated these images and lab results as part of my medical decision-making.   EKG Interpretation   Date/Time:  Monday May 04 2016 11:17:34 EDT Ventricular Rate:  55 PR Interval:  206 QRS Duration: 84 QT Interval:  372 QTC Calculation: 355 R Axis:   -75 Text Interpretation:  Sinus bradycardia Left axis deviation Inferior  infarct , age undetermined Anterolateral infarct , age undetermined  Abnormal ECG No acute changes No significant change since last tracing  Confirmed by Kathrynn Humble, MD, Thelma Comp [phone]) on 05/04/2016 12:04:02 PM      MDM   Final diagnoses:  Atypical chest pain   Pt comes in with cc of  chest pain. Pt has hx of CAD. Chest pain is atypical - L sided, very focal. Intermittent pain with no specific precipitating, aggravating or relieving factors. We will get trops x 2. Pain is not pleuritic, it is not reproducible with movement or palpation and not related to food intake, so hard to determine the exact causality. We discussed the lower likielyhood of this being cardiac cause, which is the main concern patient's husband has - so patient and husband in agreement with close Cards f/u for reassessment on risk stratification and decision to get further workup.   Varney Biles, MD 05/07/16 1118

## 2016-05-04 NOTE — Discharge Instructions (Signed)
We saw you in the ER for the chest pain/shortness of breath. °All of our cardiac workup is normal, including labs, EKG and chest X-RAY are normal. °We are not sure what is causing your discomfort, but we feel comfortable sending you home at this time. The workup in the ER is not complete, and you should follow up with your primary care doctor for further evaluation. ° °Please return to the ER if you have worsening chest pain, shortness of breath, pain radiating to your jaw, shoulder, or back, sweats or fainting. Otherwise see the Cardiologist or your primary care doctor as requested. ° ° °Nonspecific Chest Pain  °Chest pain can be caused by many different conditions. There is always a chance that your pain could be related to something serious, such as a heart attack or a blood clot in your lungs. Chest pain can also be caused by conditions that are not life-threatening. If you have chest pain, it is very important to follow up with your health care provider. °CAUSES  °Chest pain can be caused by: °· Heartburn. °· Pneumonia or bronchitis. °· Anxiety or stress. °· Inflammation around your heart (pericarditis) or lung (pleuritis or pleurisy). °· A blood clot in your lung. °· A collapsed lung (pneumothorax). It can develop suddenly on its own (spontaneous pneumothorax) or from trauma to the chest. °· Shingles infection (varicella-zoster virus). °· Heart attack. °· Damage to the bones, muscles, and cartilage that make up your chest wall. This can include: °¨ Bruised bones due to injury. °¨ Strained muscles or cartilage due to frequent or repeated coughing or overwork. °¨ Fracture to one or more ribs. °¨ Sore cartilage due to inflammation (costochondritis). °RISK FACTORS  °Risk factors for chest pain may include: °· Activities that increase your risk for trauma or injury to your chest. °· Respiratory infections or conditions that cause frequent coughing. °· Medical conditions or overeating that can cause  heartburn. °· Heart disease or family history of heart disease. °· Conditions or health behaviors that increase your risk of developing a blood clot. °· Having had chicken pox (varicella zoster). °SIGNS AND SYMPTOMS °Chest pain can feel like: °· Burning or tingling on the surface of your chest or deep in your chest. °· Crushing, pressure, aching, or squeezing pain. °· Dull or sharp pain that is worse when you move, cough, or take a deep breath. °· Pain that is also felt in your back, neck, shoulder, or arm, or pain that spreads to any of these areas. °Your chest pain may come and go, or it may stay constant. °DIAGNOSIS °Lab tests or other studies may be needed to find the cause of your pain. Your health care provider may have you take a test called an ambulatory ECG (electrocardiogram). An ECG records your heartbeat patterns at the time the test is performed. You may also have other tests, such as: °· Transthoracic echocardiogram (TTE). During echocardiography, sound waves are used to create a picture of all of the heart structures and to look at how blood flows through your heart. °· Transesophageal echocardiogram (TEE). This is a more advanced imaging test that obtains images from inside your body. It allows your health care provider to see your heart in finer detail. °· Cardiac monitoring. This allows your health care provider to monitor your heart rate and rhythm in real time. °· Holter monitor. This is a portable device that records your heartbeat and can help to diagnose abnormal heartbeats. It allows your health care provider to track your   your heart activity for several days, if needed.  Stress tests. These can be done through exercise or by taking medicine that makes your heart beat more quickly.  Blood tests.  Imaging tests. TREATMENT  Your treatment depends on what is causing your chest pain. Treatment may include:  Medicines. These may include:  Acid blockers for heartburn.  Anti-inflammatory  medicine.  Pain medicine for inflammatory conditions.  Antibiotic medicine, if an infection is present.  Medicines to dissolve blood clots.  Medicines to treat coronary artery disease.  Supportive care for conditions that do not require medicines. This may include:  Resting.  Applying heat or cold packs to injured areas.  Limiting activities until pain decreases. HOME CARE INSTRUCTIONS  If you were prescribed an antibiotic medicine, finish it all even if you start to feel better.  Avoid any activities that bring on chest pain.  Do not use any tobacco products, including cigarettes, chewing tobacco, or electronic cigarettes. If you need help quitting, ask your health care provider.  Do not drink alcohol.  Take medicines only as directed by your health care provider.  Keep all follow-up visits as directed by your health care provider. This is important. This includes any further testing if your chest pain does not go away.  If heartburn is the cause for your chest pain, you may be told to keep your head raised (elevated) while sleeping. This reduces the chance that acid will go from your stomach into your esophagus.  Make lifestyle changes as directed by your health care provider. These may include:  Getting regular exercise. Ask your health care provider to suggest some activities that are safe for you.  Eating a heart-healthy diet. A registered dietitian can help you to learn healthy eating options.  Maintaining a healthy weight.  Managing diabetes, if necessary.  Reducing stress. SEEK MEDICAL CARE IF:  Your chest pain does not go away after treatment.  You have a rash with blisters on your chest.  You have a fever. SEEK IMMEDIATE MEDICAL CARE IF:   Your chest pain is worse.  You have an increasing cough, or you cough up blood.  You have severe abdominal pain.  You have severe weakness.  You faint.  You have chills.  You have sudden, unexplained chest  discomfort.  You have sudden, unexplained discomfort in your arms, back, neck, or jaw.  You have shortness of breath at any time.  You suddenly start to sweat, or your skin gets clammy.  You feel nauseous or you vomit.  You suddenly feel light-headed or dizzy.  Your heart begins to beat quickly, or it feels like it is skipping beats. These symptoms may represent a serious problem that is an emergency. Do not wait to see if the symptoms will go away. Get medical help right away. Call your local emergency services (911 in the [place] Do not drive yourself to the hospital.   This information is not intended to replace advice given to you by your health care provider. Make sure you discuss any questions you have with your health care provider.   Document Released: 07/15/2005 Document Revised: 10/26/2014 Document Reviewed: 05/11/2014 Elsevier Interactive Patient Education [employer].

## 2016-05-11 DIAGNOSIS — E559 Vitamin D deficiency, unspecified: Secondary | ICD-10-CM | POA: Diagnosis not present

## 2016-05-11 DIAGNOSIS — I10 Essential (primary) hypertension: Secondary | ICD-10-CM | POA: Diagnosis not present

## 2016-05-11 DIAGNOSIS — I4891 Unspecified atrial fibrillation: Secondary | ICD-10-CM | POA: Diagnosis not present

## 2016-05-11 DIAGNOSIS — Z0001 Encounter for general adult medical examination with abnormal findings: Secondary | ICD-10-CM | POA: Diagnosis not present

## 2016-05-11 DIAGNOSIS — F324 Major depressive disorder, single episode, in partial remission: Secondary | ICD-10-CM | POA: Diagnosis not present

## 2016-05-11 DIAGNOSIS — I252 Old myocardial infarction: Secondary | ICD-10-CM | POA: Diagnosis not present

## 2016-05-11 DIAGNOSIS — Z1389 Encounter for screening for other disorder: Secondary | ICD-10-CM | POA: Diagnosis not present

## 2016-05-11 DIAGNOSIS — E7800 Pure hypercholesterolemia, unspecified: Secondary | ICD-10-CM | POA: Diagnosis not present

## 2016-05-11 DIAGNOSIS — I5022 Chronic systolic (congestive) heart failure: Secondary | ICD-10-CM | POA: Diagnosis not present

## 2016-05-11 DIAGNOSIS — I700 Atherosclerosis of aorta: Secondary | ICD-10-CM | POA: Diagnosis not present

## 2016-05-11 DIAGNOSIS — E039 Hypothyroidism, unspecified: Secondary | ICD-10-CM | POA: Diagnosis not present

## 2016-05-11 DIAGNOSIS — Z79899 Other long term (current) drug therapy: Secondary | ICD-10-CM | POA: Diagnosis not present

## 2016-05-11 LAB — HEPATIC FUNCTION PANEL
ALT: 16 U/L (ref 7–35)
AST: 21 U/L (ref 13–35)
Alkaline Phosphatase: 83 U/L (ref 25–125)
BILIRUBIN, TOTAL: 0.6 mg/dL
Bilirubin, Direct: 0.1 mg/dL (ref 0.01–0.4)

## 2016-05-11 LAB — LIPID PANEL
Cholesterol: 177 mg/dL (ref 0–200)
HDL: 55 mg/dL (ref 35–70)
LDL Cholesterol: 102 mg/dL
Triglycerides: 100 mg/dL (ref 40–160)

## 2016-05-11 LAB — TSH: TSH: 2.03 u[IU]/mL (ref 0.41–5.90)

## 2016-05-11 LAB — HEMOGLOBIN A1C: HEMOGLOBIN A1C: 5.3

## 2016-05-18 ENCOUNTER — Encounter: Payer: Self-pay | Admitting: Nurse Practitioner

## 2016-05-18 ENCOUNTER — Ambulatory Visit (INDEPENDENT_AMBULATORY_CARE_PROVIDER_SITE_OTHER): Payer: Medicare Other | Admitting: Nurse Practitioner

## 2016-05-18 VITALS — BP 176/84 | HR 56 | Ht 66.75 in | Wt 195.4 lb

## 2016-05-18 DIAGNOSIS — I5022 Chronic systolic (congestive) heart failure: Secondary | ICD-10-CM

## 2016-05-18 DIAGNOSIS — Z9861 Coronary angioplasty status: Secondary | ICD-10-CM

## 2016-05-18 DIAGNOSIS — I2510 Atherosclerotic heart disease of native coronary artery without angina pectoris: Secondary | ICD-10-CM

## 2016-05-18 DIAGNOSIS — R0789 Other chest pain: Secondary | ICD-10-CM | POA: Diagnosis not present

## 2016-05-18 DIAGNOSIS — I480 Paroxysmal atrial fibrillation: Secondary | ICD-10-CM

## 2016-05-18 NOTE — Progress Notes (Signed)
CARDIOLOGY OFFICE NOTE  Date:  05/18/2016    Melinda Noble Date of Birth: [birth year] Medical Record [id number]  PCP:  Henrine Screws, MD  Cardiologist:  Tamala Julian    Chief Complaint  Patient presents with  . Chest Pain  . Coronary Artery Disease  . Congestive Heart Failure    Post ER visit - seen for Dr. Tamala Julian    History of Present Illness: Melinda Noble is a 82 y.o. female who presents today for a post ER visit. Seen for Dr. Tamala Julian.   She has known CAD with prior PCI with overlapping long segment of stents in the mid to distal LAD postdilated to 3.0 back in 2014 during STEMI, chronic combined systolic and diastolic heart failure, hyperlipidemia, paroxysmal atrial fibrillation - previously on amiodarone, and essential hypertension.  Last seen back in March - felt to be doing ok. Plavix was stopped. She remained in NSR.  In the ER earlier this month with chest pain - felt to be atypical. Troponins were negative.  Was to follow up with cardiology.   Comes in today. Here with her husband today. She notes that she has had no more discomfort. This was more of a lower right sided rib pain. Nothing like her prior chest pain syndrome. It lasted for at least a whole day. It has not recurred anymore. Her troponins were negative. She does exercise and tries to be active. She swims at least 2 times a week. She swam last week without any issue. BP is typically better as an outpatient.   Past Medical History:  Diagnosis Date  . Anemia   . CAD (coronary artery disease)    a. STEMI 10/09/13 - 100% LAD - following initial angioplasty, ? Spontaneous dissection vs related to ballon. s/p complex PCI with 2 overlapping DES to LAD. Adm complicated by VDRF, cardiogenic shock, peri-MI PAF with NSR on amiodarone. Residual mod RCA disease.   Marland Kitchen CAD S/P percutaneous coronary angioplasty 10/09/13   DES x 2 to Mid LAD, 2.5 x 38, 2.5 x 12 Promus (covering extensive disection), moderate RCA disesase  .  Cardiogenic shock ([place])    a. 09/2013 - during admission for STEMI.  . Depression   . Dyslipidemia   . H/O Acute respiratory failure due to hypoxia 10/10/2013   a. 09/2013 - secondary to anterior STEMI related pulmonary edema (resolved).  . Hypertension   . Hypothyroidism   . Ischemic cardiomyopathy    a. EF 30-35% during 09/2013 adm for STEMI. b. Repeat Echo EF 40-45% with distal LAD hypokinesis (10/16/14 following STEMI recovery).  . Migraine headache    a. Remotely.  . Orthostatic hypotension    a. Adm 10/2013 for this, felt due to Lasix. Not on regular diuretic due to this/dehydration.  Marland Kitchen PAF (paroxysmal atrial fibrillation) ([place])    a. 09/2013 - peri-MI, NSR at discharge on amiodarone.  . Sinus bradycardia     Past Surgical History:  Procedure Laterality Date  . CORONARY ANGIOPLASTY WITH STENT PLACEMENT  10/09/13   2 overlapping Promus DES to mid LAD, occlusion/dissection (2.5 x 38, 2.5 x 12)  . LEFT HEART CATHETERIZATION WITH CORONARY ANGIOGRAM N/A 10/09/2013   Procedure: LEFT HEART CATHETERIZATION WITH CORONARY ANGIOGRAM;  Surgeon: Leonie Man, MD;  Location: [facility] CATH LAB;  Service: Cardiovascular;  Laterality: N/A;  . SHOULDER SURGERY       Medications: Current Outpatient Prescriptions  Medication Sig Dispense Refill  . acetaminophen (TYLENOL) 500 MG tablet Take 500-1,000 mg  by mouth every 6 (six) hours as needed for mild pain.    Marland Kitchen ALPRAZolam (XANAX) 0.25 MG tablet Take 0.25 mg by mouth 2 (two) times daily as needed for anxiety.     Marland Kitchen aspirin 81 MG chewable tablet Chew 1 tablet (81 mg total) by mouth daily.    . Biotin 1000 MCG tablet Take 1,000 mcg by mouth daily.    Marland Kitchen buPROPion (WELLBUTRIN XL) 150 MG 24 hr tablet Take 150 mg by mouth daily.    . calcium-vitamin D (OSCAL WITH D) 500-200 MG-UNIT per tablet Take 1 tablet by mouth daily.     . carvedilol (COREG) 6.25 MG tablet Take 1 tablet (6.25 mg total) by mouth 2 (two) times daily with a meal. 60 tablet 5  .  cholecalciferol (VITAMIN D) 1000 UNITS tablet Take 1,000 Units by mouth daily.    . clobetasol ointment (TEMOVATE) [id number] % Apply 1 application topically 2 (two) times daily as needed. (affected areas)  4  . furosemide (LASIX) 20 MG tablet Take 20 mg by mouth daily. Patient taking one half tablet daily.    Marland Kitchen levothyroxine (SYNTHROID, LEVOTHROID) 100 MCG tablet Take 100 mcg by mouth daily before breakfast.    . losartan (COZAAR) 50 MG tablet Take 1 tablet (50 mg total) by mouth daily. 30 tablet 5  . Multiple Vitamins-Minerals (MULTIVITAMIN WITH MINERALS) tablet Take 1 tablet by mouth daily.    . nitroGLYCERIN (NITROSTAT) 0.4 MG SL tablet Place 1 tablet (0.4 mg total) under the tongue every 5 (five) minutes as needed for chest pain. 25 tablet 6  . omeprazole (PRILOSEC) 20 MG capsule Take 20 mg by mouth daily.     [name] Glycol-Propyl Glycol (SYSTANE OP) Place 1-2 drops into both eyes 3 (three) times daily as needed (for dry eye or irritation).    . rosuvastatin (CRESTOR) 5 MG tablet Take 5 mg by mouth daily.    . sertraline (ZOLOFT) 50 MG tablet Take 50 mg by mouth 2 (two) times daily.     . vitamin E (VITAMIN E) 400 UNIT capsule Take 400 Units by mouth daily.     No current facility-administered medications for this visit.    Facility-Administered Medications Ordered in Other Visits  Medication Dose Route Frequency Provider Last Rate Last Dose  . etomidate (AMIDATE) injection    Anesthesia Intra-op Fulton Reek, MD   14 mg at 10/10/13 0105  . rocuronium [facility]) injection    Anesthesia Intra-op F[place]on Reek, MD   50 mg at 10/10/13 0110  . succinylcholine (ANECTINE) injection    Anesthesia Intra-op Fulton Reek, MD   100 mg at 10/10/13 0105    Allergies: Allergies  Allergen Reactions  . Ace Inhibitors Anaphylaxis  . Beta Adrenergic Blockers Anaphylaxis  . Iohexol Anaphylaxis     Code: HIVES, Desc: anaphylactic shock s/p contrast injection many yrs ago--suggested that pt  NEVER have iv contrast//a.c., Onset Date: [id number]   . Atorvastatin Rash  . Latex Rash  . Penicillins Swelling and Rash  . Sulfa Antibiotics Rash    Social History: The patient  reports that she has quit smoking. She has never used smokeless tobacco. She reports that she does not drink alcohol or use drugs.   Family History: The patient's family history includes Anemia in her mother; Heart attack in her father; Heart failure in her mother; Varicose Veins in her father.   Review of Systems: Please see the history of present illness.   Otherwise, the review of systems is  positive for none.   All other systems are reviewed and negative.   Physical Exam: VS:  BP (!) 176/84   Pulse (!) 56   Ht 5' 6.75" (1.695 m)   Wt 195 lb 6.4 oz (88.6 kg)   BMI 30.83 kg/m  .  BMI Body mass index is 30.83 kg/m.  Wt Readings from Last 3 Encounters:  05/18/16 195 lb 6.4 oz (88.6 kg)  05/04/16 194 lb (88 kg)  12/31/15 191 lb 1.9 oz (86.7 kg)   Repeat BP by me is 160/80.   General: Pleasant, elderly female who is alert and in no acute distress.   HEENT: Normal.  Neck: Supple, no JVD, carotid bruits, or masses noted.  Cardiac: Regular rate and rhythm. No murmurs, rubs, or gallops. No edema.  Respiratory:  Lungs are clear to auscultation bilaterally with normal work of breathing.  GI: Soft and nontender.  MS: No deformity or atrophy. Gait and ROM intact.  Skin: Warm and dry. Color is normal.  Neuro:  Strength and sensation are intact and no gross focal deficits noted.  Psych: Alert, appropriate and with normal affect.   LABORATORY DATA:  EKG:  EKG is not ordered today.  Lab Results  Component Value Date   WBC 6.9 05/04/2016   HGB 14.0 05/04/2016   HCT 44.1 05/04/2016   PLT 170 05/04/2016   GLUCOSE 109 (H) 05/04/2016   CHOL 98 10/23/2013   TRIG 130 10/23/2013   HDL 33 (A) 10/23/2013   LDLCALC 46 10/23/2013   ALT 39 (H) 11/25/2013   AST 35 11/25/2013   NA 137 05/04/2016   K 4.4  05/04/2016   CL 107 05/04/2016   CREATININE 0.85 05/04/2016   BUN 20 05/04/2016   CO2 25 05/04/2016   TSH 3.069 10/24/2013   INR 1.03 11/25/2013   HGBA1C 5.8 (H) 10/10/2013    BNP (last 3 results) No results for input(s): BNP in the last 8760 hours.  ProBNP (last 3 results) No results for input(s): PROBNP in the last 8760 hours.   Other Studies Reviewed Today:  Myoview Impression from 11/2014 Exercise Capacity:  Lexiscan with no exercise. BP Response:  Normal blood pressure response. Clinical Symptoms:  Typical symptoms with Lexiscan ECG Impression:  No significant ST segment change suggestive of ischemia. Comparison with Prior Nuclear Study: No images to compare  Overall Impression:  Intermediate risk stress nuclear study secondary to old anteroseptal/apical lateral infarct pattern as well as reduced ejection fraction of 46%. No evidence of ischemia identified..  LV Ejection Fraction: 46%.  LV Wall Motion:  There is hypokinesis of the apical, distal anteroseptal region as well as distal inferoseptal region.  Candee Furbish, MD   Assessment/Plan: 1. Chest pain - atypical with negative ER evaluation.   2. Known CAD with prior PCI - no active chest pain. Would continue with her medical management.   3. HTN - BP better as an outpatient. She will continue to monitor. No change with her current regimen.   4. HLD - recent labs noted from PCP  5. PAF - previously on amiodarone - remains in NSR.   Current medicines are reviewed with the patient today.  The patient does not have concerns regarding medicines other than what has been noted above.  The following changes have been made:  See above.  Labs/ tests ordered today include:   No orders of the defined types were placed in this encounter.    Disposition:   FU as planned unless she has  recurrent symptoms.   Patient is agreeable to this plan and will call if any problems develop in the interim.   Signed: Burtis Junes, RN, ANP-C 05/18/2016 12:37 PM  [facility] [address] [place], [place]  [address] Phone: [phone] Fax: [phone]

## 2016-05-18 NOTE — Patient Instructions (Signed)
We will be checking the following labs today - NONE   Medication Instructions:    Continue with your current medicines.     Testing/Procedures To Be Arranged:  N/A  Follow-Up:   See Dr. Tamala Julian as planned in March - I will be happy to see you back as needed.     Other Special Instructions:   Let us know if you have any more discomfort    If you need a refill on your cardiac medications before your next appointment, please call your pharmacy.   Call the [place] office at [phone] if you have any questions, problems or concerns.

## 2016-06-03 ENCOUNTER — Non-Acute Institutional Stay: Payer: Medicare Other | Admitting: Internal Medicine

## 2016-06-03 ENCOUNTER — Encounter: Payer: Self-pay | Admitting: Internal Medicine

## 2016-06-03 VITALS — BP 120/70 | HR 60 | Temp 99.2°F | Ht 67.0 in | Wt 196.0 lb

## 2016-06-03 DIAGNOSIS — E669 Obesity, unspecified: Secondary | ICD-10-CM

## 2016-06-03 DIAGNOSIS — E039 Hypothyroidism, unspecified: Secondary | ICD-10-CM

## 2016-06-03 DIAGNOSIS — E785 Hyperlipidemia, unspecified: Secondary | ICD-10-CM

## 2016-06-03 DIAGNOSIS — Z9861 Coronary angioplasty status: Secondary | ICD-10-CM | POA: Diagnosis not present

## 2016-06-03 DIAGNOSIS — E66811 Obesity, class 1: Secondary | ICD-10-CM

## 2016-06-03 DIAGNOSIS — Z79899 Other long term (current) drug therapy: Secondary | ICD-10-CM | POA: Diagnosis not present

## 2016-06-03 DIAGNOSIS — I5022 Chronic systolic (congestive) heart failure: Secondary | ICD-10-CM

## 2016-06-03 DIAGNOSIS — I2510 Atherosclerotic heart disease of native coronary artery without angina pectoris: Secondary | ICD-10-CM

## 2016-06-03 DIAGNOSIS — F411 Generalized anxiety disorder: Secondary | ICD-10-CM

## 2016-06-03 DIAGNOSIS — I10 Essential (primary) hypertension: Secondary | ICD-10-CM | POA: Diagnosis not present

## 2016-06-03 NOTE — Progress Notes (Signed)
Provider:  Rexene Noble. Melinda Noble, D.O., C.M.D. Location:   [facility] Place of Service:  Clinic (12)  Previous PCP: Melinda Kinnier, DO Patient Care Team: Melinda Curry, DO as PCP - General (Geriatric Medicine) [facility] Melinda Man, MD as Consulting Physician (Cardiology)  Extended Emergency Contact Information Primary Emergency Contact: Melinda, Noble Address: [address]          [place], [place] [address] Melinda Noble of [place] Phone: [phone] Work Phone: [phone] Mobile Phone: [phone] Relation: Spouse Secondary Emergency Contact: Melinda Noble Address: [address]          [place], [place] [address] Melinda Noble of [place] Phone: [phone] Relation: Son  Code Status: DNR Goals of Care: Advanced Directive information Advanced Directives 05/04/2016  Does patient have an advance directive? Yes  Type of Paramedic of Melinda Noble;Living will  Does patient want to make changes to advanced directive? -  Copy of advanced directive(s) in chart? Yes  Pre-existing out of facility DNR order (yellow form or pink MOST form) -  need copy on file  Chief Complaint  Patient presents with  . Establish Care    new patient    HPI: Patient is a 82 y.o. female seen today to establish with Melinda [facility].  Records have been requested from Dr. Alcide Noble have some and her new patient packet which was not fully completed.  She follows with Dr. Kenton Noble, cardiology, due to chest pain and systolic chf.    Says she has had all of her immunizations, but none are in the records from Dr. Inda Noble.    Had another ED trip with atypical chest pain.  Mliss Sax had checked her vitals and was having difficulty obtaining them.  She suggested she see her doctor and they referred her to the ED.  Was there all day.  She does note she skips a beat once in a while.  She had rib pain in the left lower ribs--thinks she pulled a muscle.  Then saw  cardiology 7/31 and no changes were made.  She does not have any active concerns.  Says she gets tired and she is helping look after her daughter had total knee replacement.    She does get depressed sometimes.  No suicidal thoughts.  Against her religion and she would not want to harm her family in that way.  Celexa and wellbutrin have helped.    Has two stents in place.  Is off plavix and on baby asa at this time.    Had been amiodarone for a very short time, but fortunately has been in sinus since.  Initial EF was in the 30s, but improved to 45-50 by 2015.    Takes allegra occasionally for nasal congestion.  Will have rhinitis that can be irritating and it's effective.  Has been on the same dose of synthroid forever--takes brand name only.    Past Medical History:  Diagnosis Date  . Anemia   . CAD (coronary artery disease)    a. STEMI 10/09/13 - 100% LAD - following initial angioplasty, ? Spontaneous dissection vs related to ballon. s/p complex PCI with 2 overlapping DES to LAD. Adm complicated by VDRF, cardiogenic shock, peri-MI PAF with NSR on amiodarone. Residual mod RCA disease.   Marland Kitchen CAD S/P percutaneous coronary angioplasty 10/09/13   DES x 2 to Mid LAD, 2.5 x 38, 2.5 x 12 Promus (covering extensive disection), moderate RCA disesase  . Cardiogenic shock ([place])    a. 09/2013 -  during admission for STEMI.  . Depression   . Dyslipidemia   . H/O Acute respiratory failure due to hypoxia 10/10/2013   a. 09/2013 - secondary to anterior STEMI related pulmonary edema (resolved).  . Hypertension   . Hypothyroidism   . Ischemic cardiomyopathy    a. EF 30-35% during 09/2013 adm for STEMI. b. Repeat Echo EF 40-45% with distal LAD hypokinesis (10/16/14 following STEMI recovery).  . Migraine headache    a. Remotely.  . Orthostatic hypotension    a. Adm 10/2013 for this, felt due to Lasix. Not on regular diuretic due to this/dehydration.  Marland Kitchen PAF (paroxysmal atrial fibrillation) ([place])    a.  09/2013 - peri-MI, NSR at discharge on amiodarone.  . Sinus bradycardia    Past Surgical History:  Procedure Laterality Date  . CORONARY ANGIOPLASTY WITH STENT PLACEMENT  10/09/13   2 overlapping Promus DES to mid LAD, occlusion/dissection (2.5 x 38, 2.5 x 12)  . LEFT HEART CATHETERIZATION WITH CORONARY ANGIOGRAM N/A 10/09/2013   Procedure: LEFT HEART CATHETERIZATION WITH CORONARY ANGIOGRAM;  Surgeon: Melinda Man, MD;  Location: [facility] C[facility];  Service: Cardiovascular;  Laterality: N/A;  . SHOULDER SURGERY      Social History   Social History  . Marital status: Married    Spouse name: N/A  . Number of children: N/A  . Years of education: N/A   Social History Main Topics  . Smoking status: Former Research scientist (life sciences)  . Smokeless tobacco: Never Used  . Alcohol use No  . Drug use: No  . Sexual activity: Not Asked   Other Topics Concern  . None   Social History Narrative   Patient is Married, since 1958 [facility])   Lives in apartment, Independent Living section at [facility] since 04/2013.   Stopped Smoking many years ago, minimal aalcohol history.   Regular exercise includes swimming 3d/week,   Patient has no Advanced planning documents    reports that she has quit smoking. She has never used smokeless tobacco. She reports that she does not drink alcohol or use drugs.   Family History  Problem Relation Age of Onset  . Varicose Veins Father   . Heart attack Father   . Heart failure Mother   . Anemia Mother     Health Maintenance  Topic Date Due  . Samul Dada  10/17/1949  . ZOSTAVAX  10/16/1992  . DEXA SCAN  10/16/1997  . PNA vac Low Risk Adult (1 of 2 - PCV13) 10/16/1997  . INFLUENZA VACCINE  05/19/2016  need immunization record from Dr. Inda Noble' office as not listed in his annual wellness note pt provided  Allergies  Allergen Reactions  . Ace Inhibitors Anaphylaxis  . Beta Adrenergic Blockers Anaphylaxis  . Iohexol Anaphylaxis     Code: HIVES, Desc:  anaphylactic shock s/p contrast injection many yrs ago--suggested that pt NEVER have iv contrast//a.c., Onset Date: [id number]   . Atorvastatin Rash  . Latex Rash  . Penicillins Swelling and Rash  . Sulfa Antibiotics Rash      Medication List       Accurate as of 06/03/16 10:34 AM. Always use your most recent med list.          acetaminophen 500 MG tablet Commonly known as:  TYLENOL Take 500-1,000 mg by mouth every 6 (six) hours as needed for mild pain.   ALPRAZolam 0.25 MG tablet Commonly known as:  XANAX Take 0.25 mg by mouth 2 (two) times daily as needed for anxiety.  aspirin 81 MG chewable tablet Chew 1 tablet (81 mg total) by mouth daily.   Biotin 1000 MCG tablet Take 1,000 mcg by mouth daily.   buPROPion 150 MG 24 hr tablet Commonly known as:  WELLBUTRIN XL Take 150 mg by mouth daily.   calcium-vitamin D 500-200 MG-UNIT tablet Commonly known as:  OSCAL WITH D Take 1 tablet by mouth daily.   carvedilol 6.25 MG tablet Commonly known as:  COREG Take 1 tablet (6.25 mg total) by mouth 2 (two) times daily with a meal.   cholecalciferol 1000 units tablet Commonly known as:  VITAMIN D Take 1,000 Units by mouth daily.   citalopram 20 MG tablet Commonly known as:  CELEXA Take 20 mg by mouth daily.   clobetasol ointment 0.05 % Commonly known as:  TEMOVATE Apply 1 application topically 2 (two) times daily as needed. (affected areas)   clopidogrel 75 MG tablet Commonly known as:  PLAVIX Take 75 mg by mouth daily.   famotidine 20 MG tablet Commonly known as:  PEPCID Take 20 mg by mouth 2 (two) times daily.   fexofenadine 60 MG tablet Commonly known as:  ALLEGRA Take 60 mg by mouth 2 (two) times daily.   furosemide 20 MG tablet Commonly known as:  LASIX Take 20 mg by mouth daily.   levothyroxine 100 MCG tablet Commonly known as:  SYNTHROID, LEVOTHROID Take 100 mcg by mouth daily before breakfast.   losartan 50 MG tablet Commonly known as:  COZAAR Take  1 tablet (50 mg total) by mouth daily.   montelukast 10 MG tablet Commonly known as:  SINGULAIR Take 10 mg by mouth at bedtime.   multivitamin with minerals tablet Take 1 tablet by mouth daily.   nitroGLYCERIN 0.4 MG SL tablet Commonly known as:  NITROSTAT Place 1 tablet (0.4 mg total) under the tongue every 5 (five) minutes as needed for chest pain.   omeprazole 20 MG capsule Commonly known as:  PRILOSEC Take 20 mg by mouth daily.   rosuvastatin 5 MG tablet Commonly known as:  CRESTOR Take 5 mg by mouth daily.   sertraline 50 MG tablet Commonly known as:  ZOLOFT Take 50 mg by mouth 2 (two) times daily.   SYSTANE OP Place 1-2 drops into both eyes 3 (three) times daily as needed (for dry eye or irritation).   vitamin E 400 UNIT capsule Generic drug:  vitamin E Take 400 Units by mouth daily.       Review of Systems  Constitutional: Positive for malaise/fatigue. Negative for chills, diaphoresis, fever and weight loss.  HENT: Positive for hearing loss and tinnitus. Negative for congestion and sore throat.        Hearing aide  Eyes: Negative for blurred vision.       Dry eyes  Respiratory: Negative for cough, sputum production, shortness of breath and wheezing.   Cardiovascular: Positive for palpitations. Negative for chest pain, orthopnea, claudication, leg swelling and PND.  Gastrointestinal: Positive for constipation. Negative for abdominal pain, blood in stool, diarrhea, heartburn, melena, nausea and vomiting.       Uses stool softener and pepto bismol; hemorrhoids  Genitourinary: Positive for frequency. Negative for dysuria, flank pain, hematuria and urgency.       Post diuretics only, no incontinence  Musculoskeletal: Negative for falls and joint pain.  Skin: Negative for itching and rash.  Neurological: Negative for dizziness, tingling, tremors, sensory change, speech change, focal weakness, seizures, loss of consciousness, weakness and headaches.    Endo/Heme/Allergies: Bruises/bleeds  easily.       Hypothyroid, on synthroid BRAND only for many years  Psychiatric/Behavioral: Positive for depression. Negative for memory loss and suicidal ideas. The patient is nervous/anxious. The patient does not have insomnia.     Vitals:   06/03/16 1013  BP: 120/70  Pulse: 60  Temp: 99.2 F (37.3 C)  TempSrc: Oral  SpO2: 95%  Weight: 196 lb (88.9 kg)  Height: 5\' 7"  (1.702 m)   Body mass index is 30.7 kg/m. Physical Exam  Constitutional: She is oriented to person, place, and time. She appears Melinda-developed and Melinda-nourished. No distress.  Obese white female  HENT:  Head: Normocephalic and atraumatic.  Right Ear: External ear normal.  Left Ear: External ear normal.  Nose: Nose normal.  Mouth/Throat: No oropharyngeal exudate.  Hearing aid  Eyes: Conjunctivae and EOM are normal. Pupils are equal, round, and reactive to light.  Neck: Normal range of motion. Neck supple. No JVD present.  Cardiovascular: Normal rate, regular rhythm, normal heart sounds and intact distal pulses.   Pulmonary/Chest: Effort normal and breath sounds normal. No respiratory distress.  Abdominal: Soft. Bowel sounds are normal. She exhibits no distension and no mass. There is no tenderness. There is no rebound and no guarding. No hernia.  Musculoskeletal: Normal range of motion.  Wobbly gait, wide-based, no assistive device used  Neurological: She is alert and oriented to person, place, and time.  Skin: Skin is warm and dry. Capillary refill takes less than 2 seconds.  Psychiatric: She has a normal mood and affect.   Labs reviewed: Basic Metabolic Panel:  Recent Labs  05/04/16 1120  NA 137  K 4.4  CL 107  CO2 25  GLUCOSE 109*  BUN 20  CREATININE 0.85  CALCIUM 10.5*   Liver Function Tests: No results for input(s): AST, ALT, ALKPHOS, BILITOT, PROT, ALBUMIN in the last 8760 hours. No results for input(s): LIPASE, AMYLASE in the last 8760 hours. No  results for input(s): AMMONIA in the last 8760 hours. CBC:  Recent Labs  05/04/16 1120  WBC 6.9  HGB 14.0  HCT 44.1  MCV 94.6  PLT 170   Cardiac Enzymes: No results for input(s): CKTOTAL, CKMB, CKMBINDEX, TROPONINI in the last 8760 hours. BNP: Invalid input(s): POCBNP Lab Results  Component Value Date   HGBA1C 5.8 (H) 10/10/2013   Lab Results  Component Value Date   TSH 3.069 10/24/2013   No results found for: VITAMINB12 No results found for: FOLATE No results found for: IRON, TIBC, FERRITIN  Imaging and Procedures noted on new patient packet: cscope 2012 with 1 polyp, but was told no further f/u per Dr. Sammuel Cooper and Dr. Cristina Gong  Mammogram--still getting each October--discussed that she could reduce to biannual as no family or personal hx of breast ca  Bone density--done at a gyn office on [facility]--? How long ago  Assessment/Plan: 1. CAD S/P percutaneous coronary angioplasty -- PCI to LAD in setting Ant STEMI; 2 overlapping Promx DES 2.5 x 38, 2.5 x 12 (postdilated to ~3 mm) -stable, is now on asa and off plavix but it's still in her med list???--will remove, continues coreg, statin  2. Chronic systolic heart failure (HCC) -continues on lasix 20mg  daily, not on potassium  3. Essential hypertension -bp is Melinda controlled on current regimen, cont same and monitor  4. Obesity (BMI 30.0-34.9) -continue exercise and active lifestyle, watch fatty foods and high carb foods  5. Generalized anxiety disorder -stable with wellbutrin and celexa plus xanax as needed  6. Dyslipidemia -cont crestor 5mg  daily and monitor  7. Hypothyroidism, unspecified hypothyroidism type -cont current synthroid brand name and monitor, f/u 6 mos  8.  Polypharmacy -pt is on numerous medications, but is not certain of the list -apparently her husband fills her pillbox -I asked her to bring her medications to her next visit so we can make sure the list is correct  -need to see if she  actually needs all of these -I'm not clear on her cognitive status just yet  Labs/tests ordered:  No new, just had at Dr. Inda [facility]' office and also in July at the [facility] with her chest pain episode  Need more records to be complete and meds to verify list.  Sherissa Tenenbaum L. Hendricks Schwandt, D.O. [facility] [address], [place] [address] Cell Phone (Mon-Fri 8am-5pm):  [phone] On Call:  [phone] & follow prompts after 5pm & weekends Office Phone:  [phone] Office Fax:  [phone]

## 2016-06-04 ENCOUNTER — Other Ambulatory Visit: Payer: Self-pay | Admitting: Interventional Cardiology

## 2016-06-08 ENCOUNTER — Other Ambulatory Visit: Payer: Self-pay | Admitting: Interventional Cardiology

## 2016-06-08 ENCOUNTER — Other Ambulatory Visit: Payer: Self-pay | Admitting: *Deleted

## 2016-06-08 MED ORDER — FUROSEMIDE 20 MG PO TABS: 10.0000 mg | ORAL_TABLET | Freq: Every day | ORAL | 3 refills | Status: DC

## 2016-06-08 MED ORDER — ALPRAZOLAM 0.25 MG PO TABS: 0.2500 mg | ORAL_TABLET | Freq: Two times a day (BID) | ORAL | 0 refills | Status: DC | PRN

## 2016-06-08 MED ORDER — FUROSEMIDE 20 MG PO TABS: 20.0000 mg | ORAL_TABLET | Freq: Every day | ORAL | 3 refills | Status: DC

## 2016-06-08 NOTE — Telephone Encounter (Signed)
[facility] 

## 2016-06-24 ENCOUNTER — Other Ambulatory Visit: Payer: Self-pay

## 2016-06-24 MED ORDER — LEVOTHYROXINE SODIUM 100 MCG PO TABS: 100.0000 ug | ORAL_TABLET | Freq: Every day | ORAL | 5 refills | Status: DC

## 2016-07-02 ENCOUNTER — Encounter: Payer: Self-pay | Admitting: Podiatry

## 2016-07-02 ENCOUNTER — Ambulatory Visit (INDEPENDENT_AMBULATORY_CARE_PROVIDER_SITE_OTHER): Payer: Medicare Other | Admitting: Podiatry

## 2016-07-02 VITALS — Ht 66.75 in | Wt 193.0 lb

## 2016-07-02 DIAGNOSIS — M79676 Pain in unspecified toe(s): Secondary | ICD-10-CM | POA: Diagnosis not present

## 2016-07-02 DIAGNOSIS — B351 Tinea unguium: Secondary | ICD-10-CM | POA: Diagnosis not present

## 2016-07-02 DIAGNOSIS — M79609 Pain in unspecified limb: Principal | ICD-10-CM

## 2016-07-02 NOTE — Progress Notes (Signed)
Subjective:     Patient ID: Melinda [facility], female   DOB: [birth year], 82 y.o.   MRN: [id number]  HPIThis patient presents to the office for preventive foot care services.  She says her nails have grown long and thick since her last visit.  She says her nails are painful walking and wearing shoes.  She presents for evaluatiion and treatment.   Review of Systems     Objective:   Physical Exam GENERAL APPEARANCE: Alert, conversant. Appropriately groomed. No acute distress.  VASCULAR: Pedal pulses palpable at  [facility] and PT bilateral.  Capillary refill time is immediate to all digits,  Normal temperature gradient.  Digital hair growth is present bilateral  NEUROLOGIC: sensation is normal to 5.07 monofilament at 5/5 sites bilateral.  Light touch is intact bilateral, Muscle strength normal.  MUSCULOSKELETAL: acceptable muscle strength, tone and stability bilateral.  Intrinsic muscluature intact bilateral.  Rectus appearance of foot and digits noted bilateral.   DERMATOLOGIC: skin color, texture, and turgor are within normal limits.  No preulcerative lesions or ulcers  are seen, no interdigital maceration noted.  No open lesions present.  . No drainage noted. NAILS  She has thick disfigured discolored nails both feet.        Assessment:     Onychomycosis  B/L     Plan:     IE  Debride nails B/L.  RTC 12 weeks   Gardiner Barefoot DPM

## 2016-07-03 ENCOUNTER — Telehealth: Payer: Self-pay | Admitting: *Deleted

## 2016-07-03 NOTE — Telephone Encounter (Signed)
Patient called and stated that the Omeprazole is causing terrible heartburn and was told that this could be a possible side effect after taking long term. Please Advise.   (can leave message if out)

## 2016-07-04 NOTE — Telephone Encounter (Signed)
I'm sorry this was not answered Friday by the covering provider.  Omeprazole is used to treat heartburn so it should not be causing it.  Perhaps, she should come in for an appointment to address this further.

## 2016-07-06 ENCOUNTER — Other Ambulatory Visit: Payer: Self-pay | Admitting: *Deleted

## 2016-07-06 MED ORDER — SERTRALINE HCL 50 MG PO TABS: 50.0000 mg | ORAL_TABLET | Freq: Two times a day (BID) | ORAL | 0 refills | Status: DC

## 2016-07-06 NOTE — Telephone Encounter (Signed)
[facility]  

## 2016-07-06 NOTE — Telephone Encounter (Signed)
Patient notified and scheduled an appointment for 10/11. Patient did not want to come into the office for an appointment. Is out of town the first week in October.

## 2016-07-29 ENCOUNTER — Non-Acute Institutional Stay: Payer: Medicare Other | Admitting: Internal Medicine

## 2016-07-29 ENCOUNTER — Encounter: Payer: Self-pay | Admitting: Internal Medicine

## 2016-07-29 VITALS — BP 140/70 | HR 56 | Temp 99.3°F | Ht 67.0 in | Wt 200.0 lb

## 2016-07-29 DIAGNOSIS — B354 Tinea corporis: Secondary | ICD-10-CM | POA: Diagnosis not present

## 2016-07-29 DIAGNOSIS — K909 Intestinal malabsorption, unspecified: Secondary | ICD-10-CM

## 2016-07-29 DIAGNOSIS — I2510 Atherosclerotic heart disease of native coronary artery without angina pectoris: Secondary | ICD-10-CM

## 2016-07-29 DIAGNOSIS — Z79899 Other long term (current) drug therapy: Secondary | ICD-10-CM | POA: Diagnosis not present

## 2016-07-29 DIAGNOSIS — Z9861 Coronary angioplasty status: Secondary | ICD-10-CM

## 2016-07-29 MED ORDER — CLOBETASOL PROPIONATE 0.05 % EX OINT: 1.0000 "application " | TOPICAL_OINTMENT | Freq: Two times a day (BID) | CUTANEOUS | 4 refills | Status: AC | PRN

## 2016-07-29 NOTE — Patient Instructions (Addendum)
Please bring box of pills along next visit.  Use clobetasol twice a day to your thighs until rash resolves and call back if it does not in 2 weeks.  Also use gold bond cream for dry skin on your hands twice a day and call me if the itching does not improve.
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
## 2016-08-10 DIAGNOSIS — B356 Tinea cruris: Secondary | ICD-10-CM | POA: Diagnosis not present

## 2016-08-10 DIAGNOSIS — Z85828 Personal history of other malignant neoplasm of skin: Secondary | ICD-10-CM | POA: Diagnosis not present

## 2016-08-11 ENCOUNTER — Other Ambulatory Visit: Payer: Self-pay | Admitting: Internal Medicine

## 2016-08-20 ENCOUNTER — Ambulatory Visit (INDEPENDENT_AMBULATORY_CARE_PROVIDER_SITE_OTHER): Payer: Medicare Other

## 2016-08-20 DIAGNOSIS — L918 Other hypertrophic disorders of the skin: Secondary | ICD-10-CM | POA: Diagnosis not present

## 2016-08-20 DIAGNOSIS — D1801 Hemangioma of skin and subcutaneous tissue: Secondary | ICD-10-CM | POA: Diagnosis not present

## 2016-08-20 DIAGNOSIS — L821 Other seborrheic keratosis: Secondary | ICD-10-CM | POA: Diagnosis not present

## 2016-08-20 DIAGNOSIS — L820 Inflamed seborrheic keratosis: Secondary | ICD-10-CM | POA: Diagnosis not present

## 2016-08-20 DIAGNOSIS — B356 Tinea cruris: Secondary | ICD-10-CM | POA: Diagnosis not present

## 2016-08-20 DIAGNOSIS — L900 Lichen sclerosus et atrophicus: Secondary | ICD-10-CM | POA: Diagnosis not present

## 2016-08-20 DIAGNOSIS — L308 Other specified dermatitis: Secondary | ICD-10-CM | POA: Diagnosis not present

## 2016-08-20 DIAGNOSIS — Z23 Encounter for immunization: Secondary | ICD-10-CM

## 2016-08-20 DIAGNOSIS — Z85828 Personal history of other malignant neoplasm of skin: Secondary | ICD-10-CM | POA: Diagnosis not present

## 2016-09-07 DIAGNOSIS — B356 Tinea cruris: Secondary | ICD-10-CM | POA: Diagnosis not present

## 2016-09-07 DIAGNOSIS — Z85828 Personal history of other malignant neoplasm of skin: Secondary | ICD-10-CM | POA: Diagnosis not present

## 2016-09-16 ENCOUNTER — Encounter: Payer: Self-pay | Admitting: Internal Medicine

## 2016-09-16 ENCOUNTER — Non-Acute Institutional Stay: Payer: Medicare Other | Admitting: Internal Medicine

## 2016-09-16 VITALS — BP 148/70 | HR 56 | Temp 98.5°F | Ht 67.0 in | Wt 201.0 lb

## 2016-09-16 DIAGNOSIS — Z9861 Coronary angioplasty status: Secondary | ICD-10-CM | POA: Diagnosis not present

## 2016-09-16 DIAGNOSIS — E669 Obesity, unspecified: Secondary | ICD-10-CM

## 2016-09-16 DIAGNOSIS — R21 Rash and other nonspecific skin eruption: Secondary | ICD-10-CM | POA: Diagnosis not present

## 2016-09-16 DIAGNOSIS — I2510 Atherosclerotic heart disease of native coronary artery without angina pectoris: Secondary | ICD-10-CM

## 2016-09-16 DIAGNOSIS — E66811 Obesity, class 1: Secondary | ICD-10-CM

## 2016-09-16 DIAGNOSIS — E785 Hyperlipidemia, unspecified: Secondary | ICD-10-CM | POA: Diagnosis not present

## 2016-09-16 DIAGNOSIS — I10 Essential (primary) hypertension: Secondary | ICD-10-CM

## 2016-09-16 DIAGNOSIS — I5022 Chronic systolic (congestive) heart failure: Secondary | ICD-10-CM | POA: Diagnosis not present

## 2016-09-16 NOTE — Progress Notes (Signed)
Location:  Occupational psychologist of Service:  Clinic (12)  Provider: Aleya Durnell L. Mariea Clonts, D.O., C.M.D.  Code Status: DNR Goals of Care:  Advanced Directives 05/04/2016  Does Patient Have a Medical Advance Directive? Yes  Type of Paramedic of [place];Living will  Does patient want to make changes to medical advance directive? -  Copy of [place] in Chart? Yes  Pre-existing out of facility DNR order (yellow form or pink MOST form) -     Chief Complaint  Patient presents with  . Medical Management of Chronic Issues    3 mth follow-up    HPI: Patient is a 82 y.o. female seen today for medical management of chronic diseases.    She went to derm [facility] dermatology) and got samples of an ointment for the rash between her legs.  Rx was then $800 but they gave her more samples.  Also told to use an antifungal powder which  completely cleared it up now.She does not know the names of any of the new products.  Gold bond didn't do any good so derm suggested cerave which seems to be working.    BP up this morning but reports she got 2 robot calls and one from a human that all told her to come at 8am not 8:30???  Goes back to cardiology after the first of the year and hopes some meds will be stopped.  Wishes she could cut down on the cholesterol which she only takes a couple of times per week.  Reports having a bone density test a couple of years ago which was fine. Had it at her gyn office at that time.  She is not anxious to add to her med list with a medicine for that problem.  Does not want this investigated.  Says she is not into preventive medicine now b/c she is being treated for other things.    Has had a lot of stress with family member staying with her who had surgery and then that was prolonged to get house renovated.   Did swim and exercise yesterday for 30 mins.    Past Medical History:  Diagnosis Date  . Anemia    . CAD (coronary artery disease)    a. STEMI 10/09/13 - 100% LAD - following initial angioplasty, ? Spontaneous dissection vs related to ballon. s/p complex PCI with 2 overlapping DES to LAD. Adm complicated by VDRF, cardiogenic shock, peri-MI PAF with NSR on amiodarone. Residual mod RCA disease.   Marland Kitchen CAD S/P percutaneous coronary angioplasty 10/09/13   DES x 2 to Mid LAD, 2.5 x 38, 2.5 x 12 Promus (covering extensive disection), moderate RCA disesase  . Cardiogenic shock ([place])    a. 09/2013 - during admission for STEMI.  . Depression   . Dyslipidemia   . H/O Acute respiratory failure due to hypoxia 10/10/2013   a. 09/2013 - secondary to anterior STEMI related pulmonary edema (resolved).  . Hypertension   . Hypothyroidism   . Ischemic cardiomyopathy    a. EF 30-35% during 09/2013 adm for STEMI. b. Repeat Echo EF 40-45% with distal LAD hypokinesis (10/16/14 following STEMI recovery).  . Migraine headache    a. Remotely.  . Orthostatic hypotension    a. Adm 10/2013 for this, felt due to Lasix. Not on regular diuretic due to this/dehydration.  Marland Kitchen PAF (paroxysmal atrial fibrillation) ([place])    a. 09/2013 - peri-MI, NSR at discharge on amiodarone.  . Sinus  bradycardia     Past Surgical History:  Procedure Laterality Date  . CORONARY ANGIOPLASTY WITH STENT PLACEMENT  10/09/13   2 overlapping Promus DES to mid LAD, occlusion/dissection (2.5 x 38, 2.5 x 12)  . LEFT HEART CATHETERIZATION WITH CORONARY ANGIOGRAM N/A 10/09/2013   Procedure: LEFT HEART CATHETERIZATION WITH CORONARY ANGIOGRAM;  Surgeon: Leonie Man, MD;  Location: [facility] CATH LAB;  Service: Cardiovascular;  Laterality: N/A;  . SHOULDER SURGERY      Allergies  Allergen Reactions  . Ace Inhibitors Anaphylaxis  . Beta Adrenergic Blockers Anaphylaxis  . Iohexol Anaphylaxis     Code: HIVES, Desc: anaphylactic shock s/p contrast injection many yrs ago--suggested that pt NEVER have iv contrast//a.c., Onset Date: [id number]   .  Atorvastatin Rash  . Latex Rash  . Penicillins Swelling and Rash  . Sulfa Antibiotics Rash      Medication List       Accurate as of 09/16/16  8:47 AM. Always use your most recent med list.          ALPRAZolam 0.25 MG tablet Commonly known as:  XANAX TAKE (1) TABLET TWICE DAILY AS NEEDED FOR ANXIETY.   aspirin 81 MG chewable tablet Chew 1 tablet (81 mg total) by mouth daily.   Biotin 1000 MCG tablet Take 1,000 mcg by mouth daily.   buPROPion 150 MG 12 hr tablet Commonly known as:  WELLBUTRIN SR Take 150 mg by mouth daily.   calcium-vitamin D 500-200 MG-UNIT tablet Commonly known as:  OSCAL WITH D Take 1 tablet by mouth daily.   carvedilol 6.25 MG tablet Commonly known as:  COREG Take 1 tablet (6.25 mg total) by mouth 2 (two) times daily with a meal.   cholecalciferol 1000 units tablet Commonly known as:  VITAMIN D Take 1,000 Units by mouth daily.   clobetasol ointment 0.05 % Commonly known as:  TEMOVATE Apply 1 application topically 2 (two) times daily as needed. (affected areas)--also thighs   furosemide 20 MG tablet Commonly known as:  LASIX Take 0.5 tablets (10 mg total) by mouth daily.   levothyroxine 100 MCG tablet Commonly known as:  SYNTHROID, LEVOTHROID Take 1 tablet (100 mcg total) by mouth daily before breakfast. BRAND NAME ONLY   losartan 50 MG tablet Commonly known as:  COZAAR Take 1 tablet (50 mg total) by mouth daily.   omeprazole 20 MG capsule Commonly known as:  PRILOSEC Take 20 mg by mouth daily.   rosuvastatin 5 MG tablet Commonly known as:  CRESTOR Take 5 mg by mouth daily.   sertraline 100 MG tablet Commonly known as:  ZOLOFT Take 100 mg by mouth daily.   vitamin E 400 UNIT capsule Generic drug:  vitamin E Take 400 Units by mouth daily.       Review of Systems:  Review of Systems  Constitutional: Negative for chills, fever and weight loss.  HENT: Negative for congestion.   Respiratory: Negative for shortness of  breath.   Cardiovascular: Negative for chest pain and palpitations.  Gastrointestinal: Negative for abdominal pain, blood in stool, constipation and melena.       Constipation resolved with dietary changes  Genitourinary: Positive for urgency. Negative for dysuria and frequency.       Urgency is at night (after 4 hrs of sleep)  Musculoskeletal: Negative for back pain, falls, joint pain, myalgias and neck pain.  Neurological: Negative for dizziness and loss of consciousness.  Endo/Heme/Allergies: Bruises/bleeds easily.  Psychiatric/Behavioral: Positive for memory loss. Negative for depression. The  patient is nervous/anxious.     Health Maintenance  Topic Date Due  . TETANUS/TDAP  10/17/1951  . ZOSTAVAX  10/16/1992  . DEXA SCAN  10/16/1997  . PNA vac Low Risk Adult (1 of 2 - PCV13) 10/16/1997  . INFLUENZA VACCINE  Completed    Physical Exam: Vitals:   09/16/16 0844  BP: (!) 148/70  Pulse: (!) 56  Temp: 98.5 F (36.9 C)  TempSrc: Oral  SpO2: 97%  Weight: 201 lb (91.2 kg)  Height: 5\' 7"  (1.702 m)   Body mass index is 31.48 kg/m. Physical Exam  Constitutional: She is oriented to person, place, and time. She appears well-developed and well-nourished. No distress.  Cardiovascular:  irreg irreg  Pulmonary/Chest: Effort normal and breath sounds normal. No respiratory distress.  Abdominal: Bowel sounds are normal.  Musculoskeletal: Normal range of motion.  Neurological: She is alert and oriented to person, place, and time.  Skin: Skin is warm and dry.  Psychiatric: She has a normal mood and affect.    Labs reviewed: Basic Metabolic Panel:  Recent Labs  05/04/16 1120 05/11/16  NA 137  --   K 4.4  --   CL 107  --   CO2 25  --   GLUCOSE 109*  --   BUN 20  --   CREATININE 0.85  --   CALCIUM 10.5*  --   TSH  --  2.03   Liver Function Tests:  Recent Labs  05/11/16  AST 21  ALT 16  ALKPHOS 83   No results for input(s): LIPASE, AMYLASE in the last 8760 hours. No  results for input(s): AMMONIA in the last 8760 hours. CBC:  Recent Labs  05/04/16 1120  WBC 6.9  HGB 14.0  HCT 44.1  MCV 94.6  PLT 170   Lipid Panel:  Recent Labs  05/11/16  CHOL 177  HDL 55  LDLCALC 102  TRIG 100   Lab Results  Component Value Date   HGBA1C 5.3 05/11/2016    Assessment/Plan 1. Essential hypertension -bp elevated today but agitated about waiting -cont losartan 50mg  daily, coreg 6.25mg  po bid  2. Rash -reports her rash resolved after getting prescriptions from dermatology  3. Chronic systolic heart failure (HCC) -cont lasix 10mg  daily, no edema, no sob, also is on ARB and beta blocker -f/u with cardiology  4. CAD S/P percutaneous coronary angioplasty -- PCI to LAD in setting Ant STEMI; 2 overlapping Promx DES 2.5 x 38, 2.5 x 12 (postdilated to ~3 mm) -cont baby asa, ARB, BB, and crestor 5mg --directions say daily but pt reports she only takes it a couple of times per week. -LDL elevated over 100 and goal <70 with her history -she is not a fan of meds -encouraged continued exercise program and trying to improve low cholesterol diet  5. Obesity (BMI 30.0-34.9) -ongoing, had not exercised for a while but did go back yesterday--emphasized importance of this  6. Dyslipidemia -cont crestor but is written as daily so should be taking this way, but is not -wants to get off of it -diet and exercise counseling performed  Refused follow up bone density b/c she does not want more medications and claims her last one from a gyn whose name she does not remember was normal.  Labs/tests ordered:  None due to upcoming cardiology visit--will do what they do not do that day  Next appt:  01/13/2017  Shekela Goodridge L. Denielle Bayard, D.O. [facility] [address],  [place] [address] Cell Phone (Mon-Fri 8am-5pm):  [phone] On Call:  [phone] & follow prompts after 5pm & weekends Office Phone:  [phone] Office Fax:   [phone]

## 2016-10-01 ENCOUNTER — Ambulatory Visit (INDEPENDENT_AMBULATORY_CARE_PROVIDER_SITE_OTHER): Payer: Medicare Other | Admitting: Podiatry

## 2016-10-01 VITALS — Ht 66.75 in | Wt 193.0 lb

## 2016-10-01 DIAGNOSIS — B351 Tinea unguium: Secondary | ICD-10-CM

## 2016-10-01 DIAGNOSIS — M79609 Pain in unspecified limb: Secondary | ICD-10-CM | POA: Diagnosis not present

## 2016-10-01 NOTE — Progress Notes (Signed)
Subjective:     Patient ID: Melinda Noble, female   DOB: 01/18/1930, 82 y.o.   MRN: [id number]  HPIThis patient presents to the office for preventive foot care services.  She says her nails have grown long and thick since her last visit.  She says her nails are painful walking and wearing shoes.  She presents for evaluatiion and treatment.   Review of Systems     Objective:   Physical Exam GENERAL APPEARANCE: Alert, conversant. Appropriately groomed. No acute distress.  VASCULAR: Pedal pulses palpable at  [facility] and PT bilateral.  Capillary refill time is immediate to all digits,  Normal temperature gradient.  Digital hair growth is present bilateral  NEUROLOGIC: sensation is normal to 5.07 monofilament at 5/5 sites bilateral.  Light touch is intact bilateral, Muscle strength normal.  MUSCULOSKELETAL: acceptable muscle strength, tone and stability bilateral.  Intrinsic muscluature intact bilateral.  Rectus appearance of foot and digits noted bilateral.   DERMATOLOGIC: skin color, texture, and turgor are within normal limits.  No preulcerative lesions or ulcers  are seen, no interdigital maceration noted.  No open lesions present.  . No drainage noted. NAILS  She has thick disfigured discolored nails both feet.        Assessment:     Onychomycosis  B/L     Plan:     IE  Debride nails B/L.  RTC 12 weeks   Gardiner Barefoot DPM

## 2016-10-17 ENCOUNTER — Other Ambulatory Visit: Payer: Self-pay | Admitting: Internal Medicine

## 2016-11-27 DIAGNOSIS — Z85828 Personal history of other malignant neoplasm of skin: Secondary | ICD-10-CM | POA: Diagnosis not present

## 2016-11-27 DIAGNOSIS — L814 Other melanin hyperpigmentation: Secondary | ICD-10-CM | POA: Diagnosis not present

## 2016-11-27 DIAGNOSIS — D2272 Melanocytic nevi of left lower limb, including hip: Secondary | ICD-10-CM | POA: Diagnosis not present

## 2016-11-27 DIAGNOSIS — L821 Other seborrheic keratosis: Secondary | ICD-10-CM | POA: Diagnosis not present

## 2016-11-27 DIAGNOSIS — D2271 Melanocytic nevi of right lower limb, including hip: Secondary | ICD-10-CM | POA: Diagnosis not present

## 2016-11-27 DIAGNOSIS — D1801 Hemangioma of skin and subcutaneous tissue: Secondary | ICD-10-CM | POA: Diagnosis not present

## 2016-11-27 DIAGNOSIS — L308 Other specified dermatitis: Secondary | ICD-10-CM | POA: Diagnosis not present

## 2016-12-19 ENCOUNTER — Other Ambulatory Visit: Payer: Self-pay | Admitting: Nurse Practitioner

## 2016-12-22 ENCOUNTER — Encounter: Payer: Self-pay | Admitting: Interventional Cardiology

## 2016-12-24 ENCOUNTER — Ambulatory Visit (INDEPENDENT_AMBULATORY_CARE_PROVIDER_SITE_OTHER): Payer: Medicare Other | Admitting: Podiatry

## 2016-12-24 ENCOUNTER — Encounter: Payer: Self-pay | Admitting: Podiatry

## 2016-12-24 VITALS — Ht 66.0 in | Wt 193.0 lb

## 2016-12-24 DIAGNOSIS — B351 Tinea unguium: Secondary | ICD-10-CM

## 2016-12-24 DIAGNOSIS — M79609 Pain in unspecified limb: Secondary | ICD-10-CM | POA: Diagnosis not present

## 2016-12-24 NOTE — Progress Notes (Signed)
Subjective:     Patient ID: Melinda Noble, female   DOB: 12-16-1929, 83 y.o.   MRN: [id number]  HPIThis patient presents to the office for preventive foot care services.  She says her nails have grown long and thick since her last visit.  She says her nails are painful walking and wearing shoes.  She presents for evaluatiion and treatment.   Review of Systems     Objective:   Physical Exam GENERAL APPEARANCE: Alert, conversant. Appropriately groomed. No acute distress.  VASCULAR: Pedal pulses palpable at  [facility] and PT bilateral.  Capillary refill time is immediate to all digits,  Normal temperature gradient.  Digital hair growth is present bilateral  NEUROLOGIC: sensation is normal to 5.07 monofilament at 5/5 sites bilateral.  Light touch is intact bilateral, Muscle strength normal.  MUSCULOSKELETAL: acceptable muscle strength, tone and stability bilateral.  Intrinsic muscluature intact bilateral.  Rectus appearance of foot and digits noted bilateral.   DERMATOLOGIC: skin color, texture, and turgor are within normal limits.  No preulcerative lesions or ulcers  are seen, no interdigital maceration noted.  No open lesions present.  . No drainage noted. NAILS  She has thick disfigured discolored nails both feet.        Assessment:     Onychomycosis  B/L     Plan:     IE  Debride nails B/L.  RTC 12 weeks   Gardiner Barefoot DPM

## 2016-12-29 NOTE — Progress Notes (Signed)
Cardiology Office Note    Date:  12/30/2016   ID:  Melinda Noble, DOB Sep 18, 1930, MRN [id number]  PCP:  Hollace Kinnier, DO  Cardiologist: Sinclair Grooms, MD   Chief Complaint  Patient presents with  . Coronary Artery Disease    History of Present Illness:  Melinda Noble is a 83 y.o. female has known CAD with prior PCI with overlapping long segment of stents in the mid to distal LAD postdilated to 3.0 back in 2014 during STEMI, chronic combined systolic and diastolic heart failure, hyperlipidemia, paroxysmal atrial fibrillation - previously on amiodarone, and essential hypertension.  Increasing memory difficulty. Denies chest pain, dyspnea, orthopnea, PND, and edema. Compliant with medications. Her husband helps take care of her. They live at [facility]. She denies palpitations and has not had syncope.  Past Medical History:  Diagnosis Date  . Anemia   . CAD (coronary artery disease)    a. STEMI 10/09/13 - 100% LAD - following initial angioplasty, ? Spontaneous dissection vs related to ballon. s/p complex PCI with 2 overlapping DES to LAD. Adm complicated by VDRF, cardiogenic shock, peri-MI PAF with NSR on amiodarone. Residual mod RCA disease.   Melinda Noble CAD S/P percutaneous coronary angioplasty 10/09/13   DES x 2 to Mid LAD, 2.5 x 38, 2.5 x 12 Promus (covering extensive disection), moderate RCA disesase  . Cardiogenic shock ([place])    a. 09/2013 - during admission for STEMI.  . Depression   . Dyslipidemia   . H/O Acute respiratory failure due to hypoxia 10/10/2013   a. 09/2013 - secondary to anterior STEMI related pulmonary edema (resolved).  . Hypertension   . Hypothyroidism   . Ischemic cardiomyopathy    a. EF 30-35% during 09/2013 adm for STEMI. b. Repeat Echo EF 40-45% with distal LAD hypokinesis (10/16/14 following STEMI recovery).  . Migraine headache    a. Remotely.  . Orthostatic hypotension    a. Adm 10/2013 for this, felt due to Lasix. Not on regular diuretic due to  this/dehydration.  Melinda Noble PAF (paroxysmal atrial fibrillation) ([place])    a. 09/2013 - peri-MI, NSR at discharge on amiodarone.  . Sinus bradycardia     Past Surgical History:  Procedure Laterality Date  . CORONARY ANGIOPLASTY WITH STENT PLACEMENT  10/09/13   2 overlapping Promus DES to mid LAD, occlusion/dissection (2.5 x 38, 2.5 x 12)  . LEFT HEART CATHETERIZATION WITH CORONARY ANGIOGRAM N/A 10/09/2013   Procedure: LEFT HEART CATHETERIZATION WITH CORONARY ANGIOGRAM;  Surgeon: Leonie Man, MD;  [facility] LAB;  Service: Cardiovascular;  Laterality: N/A;  . SHOULDER SURGERY      Current Medications: Outpatient Medications Prior to Visit  Medication Sig Dispense Refill  . ALPRAZolam (XANAX) 0.25 MG tablet TAKE (1) TABLET TWICE DAILY AS NEEDED FOR ANXIETY. 60 tablet 0  . aspirin 81 MG chewable tablet Chew 1 tablet (81 mg total) by mouth daily.    . Biotin 1000 MCG tablet Take 1,000 mcg by mouth daily.    Melinda Noble buPROPion (WELLBUTRIN SR) 150 MG 12 hr tablet Take 150 mg by mouth daily.   1  . calcium-vitamin D (OSCAL WITH D) 500-200 MG-UNIT per tablet Take 1 tablet by mouth daily.     . carvedilol (COREG) 6.25 MG tablet Take 1 tablet (6.25 mg total) by mouth 2 (two) times daily with a meal. 60 tablet 5  . cholecalciferol (VITAMIN D) 1000 UNITS tablet Take 1,000 Units by mouth daily.    . clobetasol ointment (TEMOVATE)  7.10 % Apply 1 application topically 2 (two) times daily as needed. (affected areas)--also thighs 30 g 4  . furosemide (LASIX) 20 MG tablet Take 0.5 tablets (10 mg total) by mouth daily. 30 tablet 3  . levothyroxine (SYNTHROID, LEVOTHROID) 100 MCG tablet Take 1 tablet (100 mcg total) by mouth daily before breakfast. BRAND NAME ONLY 30 tablet 5  . omeprazole (PRILOSEC) 20 MG capsule Take 20 mg by mouth daily.     . sertraline (ZOLOFT) 100 MG tablet Take 100 mg by mouth daily.   0  . vitamin E (VITAMIN E) 400 UNIT capsule Take 400 Units by mouth daily.    Melinda Noble losartan (COZAAR) 50  MG tablet Take 1 tablet (50 mg total) by mouth daily. 30 tablet 5  . rosuvastatin (CRESTOR) 5 MG tablet Take 5 mg by mouth daily.     Facility-Administered Medications Prior to Visit  Medication Dose Route Frequency Provider Last Rate Last Dose  . etomidate (AMIDATE) injection    Anesthesia Intra-op [place]on Reek, MD   14 mg at 10/10/13 0105  . rocuronium [facility]) injection    Anesthesia Intra-op F[place]on Reek, MD   50 mg at 10/10/13 0110  . succinylcholine (ANECTINE) injection    Anesthesia Intra-op F[place]on Reek, MD   100 mg at 10/10/13 0105     Allergies:   Ace inhibitors; Beta adrenergic blockers; Iohexol; Atorvastatin; Latex; Penicillins; and Sulfa antibiotics   Social History   Social History  . Marital status: Married    Spouse name: N/A  . Number of children: N/A  . Years of education: N/A   Social History Main Topics  . Smoking status: Former Research scientist (life sciences)  . Smokeless tobacco: Never Used  . Alcohol use No  . Drug use: No  . Sexual activity: Not Asked   Other Topics Concern  . None   Social History Narrative   Patient is Married, since 1958 [name])   Lives in apartment, Independent Living section at [facility] since 04/2013.   Stopped Smoking many years ago, minimal aalcohol history.   Regular exercise includes swimming 3d/week,   Patient has no Advanced planning documents     Family History:  The patient's family history includes Anemia in her mother; Heart attack in her father; Heart failure in her mother; Varicose Veins in her father.   ROS:   Please see the history of present illness.    Muscle pain, decreased urine, easy bruising, anxiety, balance, snoring, and leg pain.  All other systems reviewed and are negative.   PHYSICAL EXAM:   VS:  BP (!) 180/86 (BP Location: Left Arm)   Pulse (!) 54   Ht 5\' 6"  (1.676 m)   Wt 204 lb 9.6 oz (92.8 kg)   BMI 33.02 kg/m    GEN: Well nourished, well developed, in no acute distress    HEENT: normal  Neck: no JVD, carotid bruits, or masses Cardiac: RRR;  But there are no rubs, or gallops,no edema. Soft 2/6 systolic murmur right upper sternal border.  Respiratory:  clear to auscultation bilaterally, normal work of breathing GI: soft, nontender, nondistended, + BS MS: no deformity or atrophy  Skin: warm and dry, no rash Neuro:  Alert and Oriented x 3, Strength and sensation are intact Psych: euthymic mood, full affect  Wt Readings from Last 3 Encounters:  12/30/16 204 lb 9.6 oz (92.8 kg)  12/24/16 193 lb (87.5 kg)  10/01/16 193 lb (87.5 kg)      Studies/Labs Reviewed:  EKG:  EKG  Performed on 05/04/16 revealed left atrial abnormality, sinus bradycardia, extensive anterior infarction, left axis deviation with possible inferior infarct.  Recent Labs: 05/04/2016: BUN 20; Creatinine, Ser 0.85; Hemoglobin 14.0; Platelets 170; Potassium 4.4; Sodium 137 05/11/2016: ALT 16; TSH 2.03   Lipid Panel    Component Value Date/Time   CHOL 177 05/11/2016   TRIG 100 05/11/2016   HDL 55 05/11/2016   CHOLHDL 3.8 10/10/2013 0430   VLDL 15 10/10/2013 0430   LDLCALC 102 05/11/2016    Additional studies/ records that were reviewed today include:  December 2014 anterior myocardial infarction: Revascularization performed by Dr. Ellyn Hack CAD (coronary artery disease)      a. STEMI 10/09/13 - 100% LAD - following initial angioplasty, ? Spontaneous dissection vs related to ballon. s/p complex PCI with 2 overlapping DES to LAD. Adm complicated by VDRF, cardiogenic shock, peri-MI PAF with NSR on amiodarone. Residual mod RCA disease.    LVEF at time of acute infarction 30-35% improving to 40-45% at discharge 2014.  ASSESSMENT:    1. CAD S/P percutaneous coronary angioplasty -- PCI to LAD in setting Ant STEMI; 2 overlapping Promx DES 2.5 x 38, 2.5 x 12 (postdilated to ~3 mm)   2. Cardiomyopathy, ischemic- 30-35% on admission 10/09/13 --> increased to 40-45% prior to discharge.   3.  Chronic systolic heart failure ([place])   4. Essential hypertension   5. Dyslipidemia   6. PAF- peri-MI. NSR on Amio      PLAN:  In order of problems listed above:  1. No clinical symptoms to suggest angina. We discussed optimizing risk factors including LDL less than 70, good blood pressure control 2. We are currently uncertain about LV function. Her infarction is being greater than 2 years ago. An echocardiogram will be done to exclude progressive LV enlargement and dysfunction. 3. There is no evidence of volume overload to suggested LV function has deteriorated. Low-salt diet is recommended. We discussed the concept of progressive LV deterioration after anterior infarction. Echocardiogram as noted above, will be performed to document current status. 4. Increase losartan to 100 mg per day. Follow-up in blood pressure clinic in one month. Lipid panel and comprehensive metabolic panel will be performed on return. 5. LDL target is less than 70.    Medication Adjustments/Labs and Tests Ordered: Current medicines are reviewed at length with the patient today.  Concerns regarding medicines are outlined above.  Medication changes, Labs and Tests ordered today are listed in the Patient Instructions below. Patient Instructions  Medication Instructions:  1) INCREASE Losartan to 100mg  once daily.  Labwork: Your physician recommends that you return for lab work at time of your Hypertension Clinic appointment. (CMET and lipids.  Please come fasting to your appointment)   Testing/Procedures: Your physician has requested that you have an echocardiogram in the next 3 months. Echocardiography is a painless test that uses sound waves to create images of your heart. It provides your doctor with information about the size and shape of your heart and how well your heart's chambers and valves are working. This procedure takes approximately one hour. There are no restrictions for this  procedure.    Follow-Up: Your physician recommends that you schedule a follow-up appointment in: 1 month with our Hypertension Clinic.  Your physician wants you to follow-up in: 1 year with Dr. Tamala Julian. You will receive a reminder letter in the mail two months in advance. If you don't receive a letter, please call our office to schedule the follow-up  appointment.    Any Other Special Instructions Will Be Listed Below (If Applicable).     If you need a refill on your cardiac medications before your next appointment, please call your pharmacy.      Signed, Sinclair Grooms, MD  12/30/2016 11:01 AM    [address], [place], [place]  [address] Phone: [phone]; Fax: [phone]

## 2016-12-30 ENCOUNTER — Encounter: Payer: Self-pay | Admitting: Interventional Cardiology

## 2016-12-30 ENCOUNTER — Ambulatory Visit (INDEPENDENT_AMBULATORY_CARE_PROVIDER_SITE_OTHER): Payer: Medicare Other | Admitting: Interventional Cardiology

## 2016-12-30 VITALS — BP 180/86 | HR 54 | Ht 66.0 in | Wt 204.6 lb

## 2016-12-30 DIAGNOSIS — I5022 Chronic systolic (congestive) heart failure: Secondary | ICD-10-CM

## 2016-12-30 DIAGNOSIS — I480 Paroxysmal atrial fibrillation: Secondary | ICD-10-CM

## 2016-12-30 DIAGNOSIS — I2510 Atherosclerotic heart disease of native coronary artery without angina pectoris: Secondary | ICD-10-CM

## 2016-12-30 DIAGNOSIS — I255 Ischemic cardiomyopathy: Secondary | ICD-10-CM

## 2016-12-30 DIAGNOSIS — I10 Essential (primary) hypertension: Secondary | ICD-10-CM | POA: Diagnosis not present

## 2016-12-30 DIAGNOSIS — E785 Hyperlipidemia, unspecified: Secondary | ICD-10-CM

## 2016-12-30 DIAGNOSIS — Z9861 Coronary angioplasty status: Secondary | ICD-10-CM | POA: Diagnosis not present

## 2016-12-30 MED ORDER — LOSARTAN POTASSIUM 100 MG PO TABS: 100.0000 mg | ORAL_TABLET | Freq: Every day | ORAL | 3 refills | Status: DC

## 2016-12-30 NOTE — Patient Instructions (Signed)
Medication Instructions:  1) INCREASE Losartan to 100mg  once daily.  Labwork: Your physician recommends that you return for lab work at time of your Hypertension Clinic appointment. (CMET and lipids.  Please come fasting to your appointment)   Testing/Procedures: Your physician has requested that you have an echocardiogram in the next 3 months. Echocardiography is a painless test that uses sound waves to create images of your heart. It provides your doctor with information about the size and shape of your heart and how well your heart's chambers and valves are working. This procedure takes approximately one hour. There are no restrictions for this procedure.    Follow-Up: Your physician recommends that you schedule a follow-up appointment in: 1 month with our Hypertension Clinic.  Your physician wants you to follow-up in: 1 year with Dr. Tamala Julian. You will receive a reminder letter in the mail two months in advance. If you don't receive a letter, please call our office to schedule the follow-up appointment.    Any Other Special Instructions Will Be Listed Below (If Applicable).     If you need a refill on your cardiac medications before your next appointment, please call your pharmacy.

## 2017-01-13 ENCOUNTER — Encounter: Payer: Self-pay | Admitting: Internal Medicine

## 2017-01-18 ENCOUNTER — Ambulatory Visit: Payer: Medicare Other | Admitting: Neurology

## 2017-01-20 ENCOUNTER — Encounter: Payer: Self-pay | Admitting: Internal Medicine

## 2017-01-20 ENCOUNTER — Non-Acute Institutional Stay: Payer: Medicare Other | Admitting: Internal Medicine

## 2017-01-20 VITALS — BP 130/80 | HR 54 | Temp 99.2°F | Wt 204.0 lb

## 2017-01-20 DIAGNOSIS — I10 Essential (primary) hypertension: Secondary | ICD-10-CM | POA: Diagnosis not present

## 2017-01-20 DIAGNOSIS — I2510 Atherosclerotic heart disease of native coronary artery without angina pectoris: Secondary | ICD-10-CM | POA: Diagnosis not present

## 2017-01-20 DIAGNOSIS — K219 Gastro-esophageal reflux disease without esophagitis: Secondary | ICD-10-CM

## 2017-01-20 DIAGNOSIS — E039 Hypothyroidism, unspecified: Secondary | ICD-10-CM | POA: Diagnosis not present

## 2017-01-20 DIAGNOSIS — I255 Ischemic cardiomyopathy: Secondary | ICD-10-CM

## 2017-01-20 DIAGNOSIS — Z9861 Coronary angioplasty status: Secondary | ICD-10-CM | POA: Diagnosis not present

## 2017-01-20 DIAGNOSIS — F411 Generalized anxiety disorder: Secondary | ICD-10-CM | POA: Diagnosis not present

## 2017-01-20 NOTE — Progress Notes (Signed)
Location:  Occupational psychologist of Service:  Clinic (12)  Provider: Ansar Skoda L. Mariea Noble, D.O., C.M.D.  Code Status: DNR Goals of Care:  Advanced Directives 01/20/2017  Does Patient Have a Medical Advance Directive? Yes  Type of Advance Directive [place]  Does patient want to make changes to medical advance directive? -  Copy of [place] in Chart? Yes  Pre-existing out of facility DNR order (yellow form or pink MOST form) -   Chief Complaint  Patient presents with  . Medical Management of Chronic Issues    57mth follow-up    HPI: Patient is a 83 y.o. female seen today for medical management of chronic diseases.    Saw Dr. Tamala Julian on 3/14 [facility] of CAD s/p percutaneous coronary angioplasty PCI to LAD, ischemic cardiomyopathy, chronic systolic CHF, HTN, dyslipidemia, PAF)- Losartan increased to 100 mg per day (BP was 180/96), was to f/u in BP clinic 1 month & lipid panel and CMP at that time. LDL goal < 70.   BP elevated today 158/90 (took meds about 30 mins prior to arriving).   Nothing is bothering her. She is not sure why she keeps having to come in.  She was in [place] last week visiting friends. Is living in independent living with her husband.Her husband puts her meds in a pill box and she takes them every morning and evening. Is getting back into swimming and exercising. Her daughter had knee replacement surgery recently. She stayed with them in their apartment. She swims 2 days, does the bicycle and stepper the other 3 days a week.   Rash is resolved that was between her legs. Dermatologist took care of it.    Denies CP, SOB, swelling in LE. Is still not interested in bone density. She denies any falls.   Has appointment with Dr. Tamala Julian next week for Echo, labs, bp recheck.   Says nerves could be better.  Vacation did help calm her.    Past Medical History:  Diagnosis Date  . Anemia   . CAD (coronary artery disease)    a. STEMI 10/09/13 - 100% LAD - following initial angioplasty, ? Spontaneous dissection vs related to ballon. s/p complex PCI with 2 overlapping DES to LAD. Adm complicated by VDRF, cardiogenic shock, peri-MI PAF with NSR on amiodarone. Residual mod RCA disease.   Marland Kitchen CAD S/P percutaneous coronary angioplasty 10/09/13   DES x 2 to Mid LAD, 2.5 x 38, 2.5 x 12 Promus (covering extensive disection), moderate RCA disesase  . Cardiogenic shock ([place])    a. 09/2013 - during admission for STEMI.  . Depression   . Dyslipidemia   . H/O Acute respiratory failure due to hypoxia 10/10/2013   a. 09/2013 - secondary to anterior STEMI related pulmonary edema (resolved).  . Hypertension   . Hypothyroidism   . Ischemic cardiomyopathy    a. EF 30-35% during 09/2013 adm for STEMI. b. Repeat Echo EF 40-45% with distal LAD hypokinesis (10/16/14 following STEMI recovery).  . Migraine headache    a. Remotely.  . Orthostatic hypotension    a. Adm 10/2013 for this, felt due to Lasix. Not on regular diuretic due to this/dehydration.  Marland Kitchen PAF (paroxysmal atrial fibrillation) ([place])    a. 09/2013 - peri-MI, NSR at discharge on amiodarone.  . Sinus bradycardia     Past Surgical History:  Procedure Laterality Date  . CORONARY ANGIOPLASTY WITH STENT PLACEMENT  10/09/13   2 overlapping Promus DES to  mid LAD, occlusion/dissection (2.5 x 38, 2.5 x 12)  . LEFT HEART CATHETERIZATION WITH CORONARY ANGIOGRAM N/A 10/09/2013   Procedure: LEFT HEART CATHETERIZATION WITH CORONARY ANGIOGRAM;  Surgeon: Leonie Man, MD;  Location[facility] CATH LAB;  Service: Cardiovascular;  Laterality: N/A;  . SHOULDER SURGERY      Allergies  Allergen Reactions  . Ace Inhibitors Anaphylaxis  . Beta Adrenergic Blockers Anaphylaxis  . Iohexol Anaphylaxis     Code: HIVES, Desc: anaphylactic shock s/p contrast injection many yrs ago--suggested that pt NEVER have iv contrast//a.c., Onset Date: [id number]   . Atorvastatin Rash  . Latex Rash  .  Penicillins Swelling and Rash  . Sulfa Antibiotics Rash    Allergies as of 01/20/2017      Reactions   Ace Inhibitors Anaphylaxis   Beta Adrenergic Blockers Anaphylaxis   Iohexol Anaphylaxis    Code: HIVES, Desc: anaphylactic shock s/p contrast injection many yrs ago--suggested that pt NEVER have iv contrast//a.c., Onset Date: [id number]   Atorvastatin Rash   Latex Rash   Penicillins Swelling, Rash   Sulfa Antibiotics Rash      Medication List       Accurate as of 01/20/17  9:53 AM. Always use your most recent med list.          ALPRAZolam 0.25 MG tablet Commonly known as:  XANAX TAKE (1) TABLET TWICE DAILY AS NEEDED FOR ANXIETY.   aspirin 81 MG chewable tablet Chew 1 tablet (81 mg total) by mouth daily.   Biotin 1000 MCG tablet Take 1,000 mcg by mouth daily.   buPROPion 150 MG 12 hr tablet Commonly known as:  WELLBUTRIN SR Take 150 mg by mouth daily.   calcium-vitamin D 500-200 MG-UNIT tablet Commonly known as:  OSCAL WITH D Take 1 tablet by mouth daily.   carvedilol 6.25 MG tablet Commonly known as:  COREG Take 1 tablet (6.25 mg total) by mouth 2 (two) times daily with a meal.   cholecalciferol 1000 units tablet Commonly known as:  VITAMIN D Take 1,000 Units by mouth daily.   clobetasol ointment 0.05 % Commonly known as:  TEMOVATE Apply 1 application topically 2 (two) times daily as needed. (affected areas)--also thighs   CRESTOR 5 MG tablet Generic drug:  rosuvastatin Take 10 mg by mouth 3 (three) times a week. (Mondays, Wednesdays, and Fridays)   furosemide 20 MG tablet Commonly known as:  LASIX Take 0.5 tablets (10 mg total) by mouth daily.   levothyroxine 100 MCG tablet Commonly known as:  SYNTHROID, LEVOTHROID Take 1 tablet (100 mcg total) by mouth daily before breakfast. BRAND NAME ONLY   losartan 100 MG tablet Commonly known as:  COZAAR Take 1 tablet (100 mg total) by mouth daily.   omeprazole 20 MG capsule Commonly known as:  PRILOSEC Take  20 mg by mouth daily.   sertraline 100 MG tablet Commonly known as:  ZOLOFT Take 100 mg by mouth daily.   vitamin E 400 UNIT capsule Generic drug:  vitamin E Take 400 Units by mouth daily.       Review of Systems:  Review of Systems  Constitutional: Negative for chills, fever and malaise/fatigue.  HENT: Negative for hearing loss.   Eyes: Negative for blurred vision.  Respiratory: Negative for cough and shortness of breath.   Cardiovascular: Negative for chest pain, palpitations and leg swelling.  Gastrointestinal: Negative for abdominal pain.  Genitourinary: Negative for dysuria.  Musculoskeletal: Positive for joint pain. Negative for falls.  Skin: Negative for itching and  rash.  Neurological: Negative for dizziness, loss of consciousness and weakness.  Endo/Heme/Allergies: Bruises/bleeds easily.  Psychiatric/Behavioral: Negative for memory loss. The patient is nervous/anxious.     Health Maintenance  Topic Date Due  . TETANUS/TDAP  10/17/1949  . DEXA SCAN  10/16/1997  . PNA vac Low Risk Adult (1 of 2 - PCV13) 10/16/1997  . INFLUENZA VACCINE  05/19/2017    Physical Exam: Vitals:   01/20/17 0929  BP: (!) 158/90  Pulse: (!) 54  Temp: 99.2 F (37.3 C)  TempSrc: Oral  SpO2: 95%  Weight: 204 lb (92.5 kg)   Body mass index is 32.93 kg/m. Physical Exam  Constitutional: She is oriented to person, place, and time. She appears well-developed and well-nourished. No distress.  Cardiovascular: Normal rate and regular rhythm.   Murmur heard. Pulmonary/Chest: Effort normal and breath sounds normal. No respiratory distress.  Abdominal: Soft. Bowel sounds are normal. She exhibits no distension. There is no tenderness.  Neurological: She is alert and oriented to person, place, and time.  Skin: Skin is warm and dry.  Psychiatric:  A bit jittery, but says not anxious.      Labs reviewed: Basic Metabolic Panel:  Recent Labs  05/04/16 1120 05/11/16  NA 137  --   K 4.4   --   CL 107  --   CO2 25  --   GLUCOSE 109*  --   BUN 20  --   CREATININE 0.85  --   CALCIUM 10.5*  --   TSH  --  2.03   Liver Function Tests:  Recent Labs  05/11/16  AST 21  ALT 16  ALKPHOS 83   No results for input(s): LIPASE, AMYLASE in the last 8760 hours. No results for input(s): AMMONIA in the last 8760 hours. CBC:  Recent Labs  05/04/16 1120  WBC 6.9  HGB 14.0  HCT 44.1  MCV 94.6  PLT 170   Lipid Panel:  Recent Labs  05/11/16  CHOL 177  HDL 55  LDLCALC 102  TRIG 100   Lab Results  Component Value Date   HGBA1C 5.3 05/11/2016    Assessment/Plan 1. Essential hypertension -bp improved on recheck here to 130/80 (after med in system more than 30 mins) since Dr. Tamala Julian increased losartan to 100mg  daily, has f/u with Dr. Tamala Julian to reassess bp and for echo coming up   2. CAD S/P percutaneous coronary angioplasty -- PCI to LAD in setting Ant STEMI; 2 overlapping Promx DES 2.5 x 38, 2.5 x 12 (postdilated to ~3 mm) -cont asa, beta blocker, arb, statin therapy, keep f/u with Dr. Tamala Julian  3. Generalized anxiety disorder -unfortunately, has required xanax therapy for her nerves which is not recommended in geriatrics due to side effects; she is also on zoloft and wellbutrin therapy already and still requiring the xanax bid prn--will plan to discuss a taper next time, just filled in march  4. Hypothyroidism, unspecified type -cont levothyroxine therapy, will plan on f/u TSH after I see her in August   5. Gastroesophageal reflux disease, esophagitis presence not specified -had been bothersome to her, but has gotten better recently, continues on prilosec  6.  Cardiomyopathy -no c/o sob, EF too good for entresto, has f/u echo coming up, on ARB, beta blocker, lasix 10mg  daily, has not required potassium supplement, no edema  Labs/tests ordered:  Plan on TSH, hba1c, cbc after annual wellness in august (has bmp and lipid coming up with Dr. Tamala Julian)  Next appt:   05/19/2017  Clerance Umland L. Natavia Sublette, D.O. [facility] [address], [place] [address] Cell Phone (Mon-Fri 8am-5pm):  [phone] On Call:  [phone] & follow prompts after 5pm & weekends Office Phone:  [phone] Office Fax:  [phone]

## 2017-01-25 NOTE — Progress Notes (Signed)
Patient ID: Melinda Noble                 DOB: Dec 08, 1929                      MRN: [id number]     HPI: Melinda Noble is a 83 y.o. female patient of Dr. Tamala Julian who presents today for hypertension evaluation. PMH includes CAD with prior PCI with overlapping long segment of stents in the mid to distal LAD postdilated to 3.0 back in 2014 during STEMI, chronic combined systolic and diastolic heart failure, hyperlipidemia, paroxysmal atrial fibrillation - previously on amiodarone, and essential hypertension. She was noted to have increasing memory difficulty. At her most recent OV with Dr. Tamala Julian her losartan was increased to 100mg  daily.   She presents today stating that she feels more tired than she thinks that she should. She reports that other than the lethargy (which has been on-going) she has been doing well.   Current HTN meds:  Losartan 100mg  daily  Furosemide 10mg  daily Carvedilol 6.25mg  BID  Previously tried: ACEi - anaphylaxis, beta blockers - anaphylaxis (per chart)  BP goal: <140/90 (given age)  Family History: The patient's family history includes Anemia in her mother; Heart attack in her father; Heart failure in her mother; Varicose Veins in her father.   Social History: Former smoker  Diet: Eat from dining facility. Drinks decaf. Drinks 2 cups of hot tea per day and diet coke occasionally.   Exercise: Swam 2 days and did machines at the gym 4 days.   Home BP readings: has machine but has not been checking  Wt Readings from Last 3 Encounters:  01/20/17 204 lb (92.5 kg)  12/30/16 204 lb 9.6 oz (92.8 kg)  12/24/16 193 lb (87.5 kg)   BP Readings from Last 3 Encounters:  01/26/17 136/78  01/20/17 130/80  12/30/16 (!) 180/86   Pulse Readings from Last 3 Encounters:  01/26/17 (!) 52  01/20/17 (!) 54  12/30/16 (!) 54    Renal function: CrCl cannot be calculated (Patient's most recent lab result is older than the maximum 21 days allowed.).  Past Medical History:  Diagnosis  Date  . Anemia   . CAD (coronary artery disease)    a. STEMI 10/09/13 - 100% LAD - following initial angioplasty, ? Spontaneous dissection vs related to ballon. s/p complex PCI with 2 overlapping DES to LAD. Adm complicated by VDRF, cardiogenic shock, peri-MI PAF with NSR on amiodarone. Residual mod RCA disease.   Marland Kitchen CAD S/P percutaneous coronary angioplasty 10/09/13   DES x 2 to Mid LAD, 2.5 x 38, 2.5 x 12 Promus (covering extensive disection), moderate RCA disesase  . Cardiogenic shock ([place])    a. 09/2013 - during admission for STEMI.  . Depression   . Dyslipidemia   . H/O Acute respiratory failure due to hypoxia 10/10/2013   a. 09/2013 - secondary to anterior STEMI related pulmonary edema (resolved).  . Hypertension   . Hypothyroidism   . Ischemic cardiomyopathy    a. EF 30-35% during 09/2013 adm for STEMI. b. Repeat Echo EF 40-45% with distal LAD hypokinesis (10/16/14 following STEMI recovery).  . Migraine headache    a. Remotely.  . Orthostatic hypotension    a. Adm 10/2013 for this, felt due to Lasix. Not on regular diuretic due to this/dehydration.  Marland Kitchen PAF (paroxysmal atrial fibrillation) ([place])    a. 09/2013 - peri-MI, NSR at discharge on amiodarone.  . Sinus bradycardia  Current Outpatient Prescriptions on File Prior to Visit  Medication Sig Dispense Refill  . ALPRAZolam (XANAX) 0.25 MG tablet TAKE (1) TABLET TWICE DAILY AS NEEDED FOR ANXIETY. 60 tablet 0  . aspirin 81 MG chewable tablet Chew 1 tablet (81 mg total) by mouth daily.    . Biotin 1000 MCG tablet Take 1,000 mcg by mouth daily.    Marland Kitchen buPROPion (WELLBUTRIN SR) 150 MG 12 hr tablet Take 150 mg by mouth daily.   1  . calcium-vitamin D (OSCAL WITH D) 500-200 MG-UNIT per tablet Take 1 tablet by mouth daily.     . carvedilol (COREG) 6.25 MG tablet Take 1 tablet (6.25 mg total) by mouth 2 (two) times daily with a meal. 60 tablet 5  . cholecalciferol (VITAMIN D) 1000 UNITS tablet Take 1,000 Units by mouth daily.    .  clobetasol ointment (TEMOVATE) 4.69 % Apply 1 application topically 2 (two) times daily as needed. (affected areas)--also thighs 30 g 4  . furosemide (LASIX) 20 MG tablet Take 0.5 tablets (10 mg total) by mouth daily. 30 tablet 3  . levothyroxine (SYNTHROID, LEVOTHROID) 100 MCG tablet Take 1 tablet (100 mcg total) by mouth daily before breakfast. BRAND NAME ONLY 30 tablet 5  . losartan (COZAAR) 100 MG tablet Take 1 tablet (100 mg total) by mouth daily. 90 tablet 3  . omeprazole (PRILOSEC) 20 MG capsule Take 20 mg by mouth daily.     . rosuvastatin (CRESTOR) 5 MG tablet Take 10 mg by mouth 3 (three) times a week. (Mondays, Wednesdays, and Fridays)    . sertraline (ZOLOFT) 100 MG tablet Take 100 mg by mouth daily.   0  . vitamin E (VITAMIN E) 400 UNIT capsule Take 400 Units by mouth daily.     Current Facility-Administered Medications on File Prior to Visit  Medication Dose Route Frequency Provider Last Rate Last Dose  . etomidate (AMIDATE) injection    Anesthesia Intra-op [place] Reek, MD   14 mg at 10/10/13 0105  . rocuronium [facility]) injection    Anesthesia Intra-op [place] Reek, MD   50 mg at 10/10/13 0110  . succinylcholine (ANECTINE) injection    Anesthesia Intra-op [place] Reek, MD   100 mg at 10/10/13 0105    Allergies  Allergen Reactions  . Ace Inhibitors Anaphylaxis  . Beta Adrenergic Blockers Anaphylaxis  . Iohexol Anaphylaxis     Code: HIVES, Desc: anaphylactic shock s/p contrast injection many yrs ago--suggested that pt NEVER have iv contrast//a.c., Onset Date: [id number]   . Atorvastatin Rash  . Latex Rash  . Penicillins Swelling and Rash  . Sulfa Antibiotics Rash    Blood pressure 136/78, pulse (!) 52.   Assessment/Plan: Hypertension: BP is at goal <140/90. Continue current therapies. Asked that she monitor her pressure at home occasionally. Follow up with Dr. Tamala Julian as indicated and HTN clinic as needed.    Thank you, Lelan Pons. Patterson Hammersmith, [facility]  01/26/2017 9:08 AM

## 2017-01-26 ENCOUNTER — Ambulatory Visit (HOSPITAL_COMMUNITY): Payer: Medicare Other | Attending: Internal Medicine

## 2017-01-26 ENCOUNTER — Other Ambulatory Visit: Payer: Self-pay

## 2017-01-26 ENCOUNTER — Encounter: Payer: Self-pay | Admitting: Pharmacist

## 2017-01-26 ENCOUNTER — Ambulatory Visit (INDEPENDENT_AMBULATORY_CARE_PROVIDER_SITE_OTHER): Payer: Medicare Other | Admitting: Pharmacist

## 2017-01-26 ENCOUNTER — Other Ambulatory Visit: Payer: Medicare Other | Admitting: *Deleted

## 2017-01-26 VITALS — BP 158/92

## 2017-01-26 VITALS — BP 136/78 | HR 52

## 2017-01-26 DIAGNOSIS — I10 Essential (primary) hypertension: Secondary | ICD-10-CM

## 2017-01-26 DIAGNOSIS — I5022 Chronic systolic (congestive) heart failure: Secondary | ICD-10-CM

## 2017-01-26 DIAGNOSIS — I081 Rheumatic disorders of both mitral and tricuspid valves: Secondary | ICD-10-CM | POA: Diagnosis not present

## 2017-01-26 DIAGNOSIS — I255 Ischemic cardiomyopathy: Secondary | ICD-10-CM | POA: Diagnosis not present

## 2017-01-26 DIAGNOSIS — Z9861 Coronary angioplasty status: Secondary | ICD-10-CM | POA: Diagnosis not present

## 2017-01-26 DIAGNOSIS — I2510 Atherosclerotic heart disease of native coronary artery without angina pectoris: Secondary | ICD-10-CM | POA: Diagnosis not present

## 2017-01-26 DIAGNOSIS — I480 Paroxysmal atrial fibrillation: Secondary | ICD-10-CM

## 2017-01-26 LAB — LIPID PANEL
CHOLESTEROL TOTAL: 152 mg/dL (ref 100–199)
Chol/HDL Ratio: 2.7 ratio (ref 0.0–4.4)
HDL: 56 mg/dL (ref >39)
LDL Calculated: 78 mg/dL (ref 0–99)
TRIGLYCERIDES: 92 mg/dL (ref 0–149)
VLDL Cholesterol Cal: 18 mg/dL (ref 5–40)

## 2017-01-26 LAB — COMPREHENSIVE METABOLIC PANEL
A/G RATIO: 1.7 (ref 1.2–2.2)
ALK PHOS: 84 IU/L (ref 39–117)
ALT: 20 IU/L (ref 0–32)
AST: 23 IU/L (ref 0–40)
Albumin: 4.2 g/dL (ref 3.5–4.7)
BILIRUBIN TOTAL: 0.5 mg/dL (ref 0.0–1.2)
BUN/Creatinine Ratio: 22 (ref 12–28)
BUN: 17 mg/dL (ref 8–27)
CHLORIDE: 104 mmol/L (ref 96–106)
CO2: 21 mmol/L (ref 18–29)
Calcium: 10.4 mg/dL — ABNORMAL HIGH (ref 8.7–10.3)
Creatinine, Ser: 0.79 mg/dL (ref 0.57–1.00)
GFR calc non Af Amer: 69 mL/min/{1.73_m2} (ref >59)
GFR, EST AFRICAN AMERICAN: 79 mL/min/{1.73_m2} (ref >59)
GLUCOSE: 101 mg/dL — AB (ref 65–99)
Globulin, Total: 2.5 g/dL (ref 1.5–4.5)
POTASSIUM: 4.7 mmol/L (ref 3.5–5.2)
Sodium: 140 mmol/L (ref 134–144)
Total Protein: 6.7 g/dL (ref 6.0–8.5)

## 2017-01-26 LAB — ECHOCARDIOGRAM COMPLETE

## 2017-01-26 MED ORDER — PERFLUTREN LIPID MICROSPHERE
1.0000 mL | INTRAVENOUS | Status: AC | PRN
  Administered 2017-01-26: 1 mL via INTRAVENOUS

## 2017-01-26 NOTE — Patient Instructions (Signed)
  Check your blood pressure at home daily (if able) and keep record of the readings.  Take your BP meds as follows: CONTINUE losartan 100mg  daily, furosemide 10mg  daily and carvedilol 6.25mg  daily   Bring all of your meds, your BP cuff and your record of home blood pressures to your next appointment.  Exercise as you're able, try to walk approximately 30 minutes per day.  Keep salt intake to a minimum, especially watch canned and prepared boxed foods.  Eat more fresh fruits and vegetables and fewer canned items.  Avoid eating in fast food restaurants.    HOW TO TAKE YOUR BLOOD PRESSURE: . Rest 5 minutes before taking your blood pressure. .  Don't smoke or drink caffeinated beverages for at least 30 minutes before. . Take your blood pressure before (not after) you eat. . Sit comfortably with your back supported and both feet on the floor (don't cross your legs). . Elevate your arm to heart level on a table or a desk. . Use the proper sized cuff. It should fit smoothly and snugly around your bare upper arm. There should be enough room to slip a fingertip under the cuff. The bottom edge of the cuff should be 1 inch above the crease of the elbow. . Ideally, take 3 measurements at one sitting and record the average.

## 2017-01-27 ENCOUNTER — Telehealth: Payer: Self-pay | Admitting: Interventional Cardiology

## 2017-01-27 NOTE — Telephone Encounter (Signed)
Informed pt of echo results. Pt verbalized understanding. 

## 2017-01-27 NOTE — Telephone Encounter (Signed)
Melinda Noble is returning a call about her echo test results . Thanks

## 2017-02-13 ENCOUNTER — Other Ambulatory Visit: Payer: Self-pay | Admitting: Nurse Practitioner

## 2017-02-16 ENCOUNTER — Ambulatory Visit: Payer: Medicare Other | Admitting: Neurology

## 2017-02-20 ENCOUNTER — Other Ambulatory Visit: Payer: Self-pay | Admitting: Internal Medicine

## 2017-02-23 ENCOUNTER — Ambulatory Visit: Payer: Medicare Other | Admitting: Neurology

## 2017-02-23 DIAGNOSIS — J45909 Unspecified asthma, uncomplicated: Secondary | ICD-10-CM | POA: Diagnosis not present

## 2017-02-23 DIAGNOSIS — J209 Acute bronchitis, unspecified: Secondary | ICD-10-CM | POA: Diagnosis not present

## 2017-02-24 ENCOUNTER — Encounter: Payer: Self-pay | Admitting: Neurology

## 2017-02-27 ENCOUNTER — Other Ambulatory Visit: Payer: Self-pay | Admitting: Internal Medicine

## 2017-03-26 ENCOUNTER — Ambulatory Visit (INDEPENDENT_AMBULATORY_CARE_PROVIDER_SITE_OTHER): Payer: Medicare Other | Admitting: Podiatry

## 2017-03-26 ENCOUNTER — Encounter: Payer: Self-pay | Admitting: Podiatry

## 2017-03-26 DIAGNOSIS — M79609 Pain in unspecified limb: Secondary | ICD-10-CM

## 2017-03-26 DIAGNOSIS — B351 Tinea unguium: Secondary | ICD-10-CM

## 2017-03-26 NOTE — Progress Notes (Signed)
Subjective:     Patient ID: Melinda Noble, female   DOB: 1929/10/23, 83 y.o.   MRN: [id number]  HPIThis patient presents to the office for preventive foot care services.  She says her nails have grown long and thick since her last visit.  She says her nails are painful walking and wearing shoes.  She presents for evaluatiion and treatment.   Review of Systems     Objective:   Physical Exam GENERAL APPEARANCE: Alert, conversant. Appropriately groomed. No acute distress.  VASCULAR: Pedal pulses palpable at  [facility] and PT bilateral.  Capillary refill time is immediate to all digits,  Normal temperature gradient.  Digital hair growth is present bilateral  NEUROLOGIC: sensation is normal to 5.07 monofilament at 5/5 sites bilateral.  Light touch is intact bilateral, Muscle strength normal.  MUSCULOSKELETAL: acceptable muscle strength, tone and stability bilateral.  Intrinsic muscluature intact bilateral.  Rectus appearance of foot and digits noted bilateral.   DERMATOLOGIC: skin color, texture, and turgor are within normal limits.  No preulcerative lesions or ulcers  are seen, no interdigital maceration noted.  No open lesions present.  . No drainage noted. NAILS  She has thick disfigured discolored nails both feet.        Assessment:     Onychomycosis  B/L     Plan:     IE  Debride nails B/L.  RTC 12 weeks   Gardiner Barefoot DPM

## 2017-04-03 ENCOUNTER — Other Ambulatory Visit: Payer: Self-pay | Admitting: Interventional Cardiology

## 2017-04-17 ENCOUNTER — Other Ambulatory Visit: Payer: Self-pay | Admitting: Internal Medicine

## 2017-04-19 ENCOUNTER — Other Ambulatory Visit: Payer: Self-pay | Admitting: Internal Medicine

## 2017-04-19 ENCOUNTER — Telehealth: Payer: Self-pay | Admitting: Internal Medicine

## 2017-04-19 NOTE — Telephone Encounter (Signed)
04/19/17 Left msg asking pt to schedule AWV-S w/ nurse on 04/20/17 or 04/27/17. VDM (DD)

## 2017-04-19 NOTE — Telephone Encounter (Signed)
rx called into pharmacy

## 2017-04-19 NOTE — Telephone Encounter (Signed)
Pt called back and said she will call back to schedule AWV because she and her husband like to go together.  She also requested to cancel the 05/19/17 appt with Dr. Mariea Clonts because she will be out of town. VDM (DD)

## 2017-04-23 DIAGNOSIS — H10413 Chronic giant papillary conjunctivitis, bilateral: Secondary | ICD-10-CM | POA: Diagnosis not present

## 2017-04-23 DIAGNOSIS — H26493 Other secondary cataract, bilateral: Secondary | ICD-10-CM | POA: Diagnosis not present

## 2017-04-23 DIAGNOSIS — Z961 Presence of intraocular lens: Secondary | ICD-10-CM | POA: Diagnosis not present

## 2017-04-23 DIAGNOSIS — H04123 Dry eye syndrome of bilateral lacrimal glands: Secondary | ICD-10-CM | POA: Diagnosis not present

## 2017-04-23 DIAGNOSIS — H353131 Nonexudative age-related macular degeneration, bilateral, early dry stage: Secondary | ICD-10-CM | POA: Diagnosis not present

## 2017-04-23 DIAGNOSIS — H31092 Other chorioretinal scars, left eye: Secondary | ICD-10-CM | POA: Diagnosis not present

## 2017-04-23 DIAGNOSIS — H35411 Lattice degeneration of retina, right eye: Secondary | ICD-10-CM | POA: Diagnosis not present

## 2017-05-01 ENCOUNTER — Other Ambulatory Visit: Payer: Self-pay | Admitting: Internal Medicine

## 2017-05-15 ENCOUNTER — Other Ambulatory Visit: Payer: Self-pay | Admitting: Internal Medicine

## 2017-05-19 ENCOUNTER — Encounter: Payer: Self-pay | Admitting: Internal Medicine

## 2017-06-19 ENCOUNTER — Other Ambulatory Visit: Payer: Self-pay | Admitting: Internal Medicine

## 2017-06-20 ENCOUNTER — Emergency Department (HOSPITAL_COMMUNITY): Payer: Medicare Other

## 2017-06-20 ENCOUNTER — Inpatient Hospital Stay (HOSPITAL_COMMUNITY): Payer: Medicare Other

## 2017-06-20 ENCOUNTER — Emergency Department (HOSPITAL_COMMUNITY): Payer: Medicare Other | Admitting: Anesthesiology

## 2017-06-20 ENCOUNTER — Inpatient Hospital Stay (HOSPITAL_COMMUNITY)
Admission: EM | Admit: 2017-06-20 | Discharge: 2017-07-03 | DRG: 023 | Disposition: A | Discharge status: Skilled Nursing Facility | Payer: Medicare Other | Attending: Neurology | Admitting: Neurology

## 2017-06-20 ENCOUNTER — Encounter (HOSPITAL_COMMUNITY): Payer: Self-pay | Admitting: *Deleted

## 2017-06-20 ENCOUNTER — Encounter (HOSPITAL_COMMUNITY)
Admission: EM | Disposition: A | Discharge status: Skilled Nursing Facility | Payer: Self-pay | Source: Home / Self Care | Attending: Neurology

## 2017-06-20 DIAGNOSIS — Y92002 Bathroom of unspecified non-institutional (private) residence single-family (private) house as the place of occurrence of the external cause: Secondary | ICD-10-CM

## 2017-06-20 DIAGNOSIS — Y92230 Patient room in hospital as the place of occurrence of the external cause: Secondary | ICD-10-CM | POA: Diagnosis not present

## 2017-06-20 DIAGNOSIS — I517 Cardiomegaly: Secondary | ICD-10-CM | POA: Diagnosis not present

## 2017-06-20 DIAGNOSIS — I480 Paroxysmal atrial fibrillation: Secondary | ICD-10-CM | POA: Diagnosis present

## 2017-06-20 DIAGNOSIS — I63312 Cerebral infarction due to thrombosis of left middle cerebral artery: Secondary | ICD-10-CM | POA: Diagnosis not present

## 2017-06-20 DIAGNOSIS — W19XXXA Unspecified fall, initial encounter: Secondary | ICD-10-CM | POA: Diagnosis present

## 2017-06-20 DIAGNOSIS — J810 Acute pulmonary edema: Secondary | ICD-10-CM

## 2017-06-20 DIAGNOSIS — S0083XA Contusion of other part of head, initial encounter: Secondary | ICD-10-CM | POA: Diagnosis not present

## 2017-06-20 DIAGNOSIS — G9340 Encephalopathy, unspecified: Secondary | ICD-10-CM | POA: Diagnosis not present

## 2017-06-20 DIAGNOSIS — Z880 Allergy status to penicillin: Secondary | ICD-10-CM | POA: Diagnosis not present

## 2017-06-20 DIAGNOSIS — F329 Major depressive disorder, single episode, unspecified: Secondary | ICD-10-CM | POA: Diagnosis not present

## 2017-06-20 DIAGNOSIS — D649 Anemia, unspecified: Secondary | ICD-10-CM | POA: Diagnosis not present

## 2017-06-20 DIAGNOSIS — I6300 Cerebral infarction due to thrombosis of unspecified precerebral artery: Secondary | ICD-10-CM | POA: Diagnosis not present

## 2017-06-20 DIAGNOSIS — S0011XA Contusion of right eyelid and periocular area, initial encounter: Secondary | ICD-10-CM | POA: Diagnosis present

## 2017-06-20 DIAGNOSIS — E039 Hypothyroidism, unspecified: Secondary | ICD-10-CM | POA: Diagnosis present

## 2017-06-20 DIAGNOSIS — T426X5A Adverse effect of other antiepileptic and sedative-hypnotic drugs, initial encounter: Secondary | ICD-10-CM | POA: Diagnosis not present

## 2017-06-20 DIAGNOSIS — Z87892 Personal history of anaphylaxis: Secondary | ICD-10-CM

## 2017-06-20 DIAGNOSIS — I252 Old myocardial infarction: Secondary | ICD-10-CM

## 2017-06-20 DIAGNOSIS — R29708 NIHSS score 8: Secondary | ICD-10-CM | POA: Diagnosis present

## 2017-06-20 DIAGNOSIS — S301XXA Contusion of abdominal wall, initial encounter: Secondary | ICD-10-CM | POA: Diagnosis not present

## 2017-06-20 DIAGNOSIS — I639 Cerebral infarction, unspecified: Secondary | ICD-10-CM | POA: Diagnosis not present

## 2017-06-20 DIAGNOSIS — E785 Hyperlipidemia, unspecified: Secondary | ICD-10-CM | POA: Diagnosis not present

## 2017-06-20 DIAGNOSIS — I2510 Atherosclerotic heart disease of native coronary artery without angina pectoris: Secondary | ICD-10-CM | POA: Diagnosis present

## 2017-06-20 DIAGNOSIS — I63512 Cerebral infarction due to unspecified occlusion or stenosis of left middle cerebral artery: Secondary | ICD-10-CM | POA: Diagnosis not present

## 2017-06-20 DIAGNOSIS — R4189 Other symptoms and signs involving cognitive functions and awareness: Secondary | ICD-10-CM | POA: Diagnosis present

## 2017-06-20 DIAGNOSIS — R609 Edema, unspecified: Secondary | ICD-10-CM | POA: Diagnosis not present

## 2017-06-20 DIAGNOSIS — S0990XA Unspecified injury of head, initial encounter: Secondary | ICD-10-CM | POA: Diagnosis not present

## 2017-06-20 DIAGNOSIS — I724 Aneurysm of artery of lower extremity: Secondary | ICD-10-CM | POA: Diagnosis not present

## 2017-06-20 DIAGNOSIS — J9600 Acute respiratory failure, unspecified whether with hypoxia or hypercapnia: Secondary | ICD-10-CM | POA: Diagnosis not present

## 2017-06-20 DIAGNOSIS — R4182 Altered mental status, unspecified: Secondary | ICD-10-CM | POA: Diagnosis not present

## 2017-06-20 DIAGNOSIS — I638 Other cerebral infarction: Secondary | ICD-10-CM | POA: Diagnosis not present

## 2017-06-20 DIAGNOSIS — I459 Conduction disorder, unspecified: Secondary | ICD-10-CM | POA: Diagnosis not present

## 2017-06-20 DIAGNOSIS — E162 Hypoglycemia, unspecified: Secondary | ICD-10-CM | POA: Diagnosis not present

## 2017-06-20 DIAGNOSIS — I63542 Cerebral infarction due to unspecified occlusion or stenosis of left cerebellar artery: Secondary | ICD-10-CM | POA: Diagnosis not present

## 2017-06-20 DIAGNOSIS — J9601 Acute respiratory failure with hypoxia: Secondary | ICD-10-CM

## 2017-06-20 DIAGNOSIS — I10 Essential (primary) hypertension: Secondary | ICD-10-CM | POA: Diagnosis present

## 2017-06-20 DIAGNOSIS — E669 Obesity, unspecified: Secondary | ICD-10-CM | POA: Diagnosis present

## 2017-06-20 DIAGNOSIS — Z955 Presence of coronary angioplasty implant and graft: Secondary | ICD-10-CM

## 2017-06-20 DIAGNOSIS — J9691 Respiratory failure, unspecified with hypoxia: Secondary | ICD-10-CM | POA: Diagnosis not present

## 2017-06-20 DIAGNOSIS — I9589 Other hypotension: Secondary | ICD-10-CM | POA: Diagnosis not present

## 2017-06-20 DIAGNOSIS — Z978 Presence of other specified devices: Secondary | ICD-10-CM | POA: Diagnosis not present

## 2017-06-20 DIAGNOSIS — R1310 Dysphagia, unspecified: Secondary | ICD-10-CM | POA: Diagnosis present

## 2017-06-20 DIAGNOSIS — I509 Heart failure, unspecified: Secondary | ICD-10-CM | POA: Diagnosis not present

## 2017-06-20 DIAGNOSIS — Z882 Allergy status to sulfonamides status: Secondary | ICD-10-CM

## 2017-06-20 DIAGNOSIS — R5381 Other malaise: Secondary | ICD-10-CM | POA: Diagnosis not present

## 2017-06-20 DIAGNOSIS — I63132 Cerebral infarction due to embolism of left carotid artery: Secondary | ICD-10-CM | POA: Diagnosis not present

## 2017-06-20 DIAGNOSIS — R29810 Facial weakness: Secondary | ICD-10-CM | POA: Diagnosis present

## 2017-06-20 DIAGNOSIS — Z01818 Encounter for other preprocedural examination: Secondary | ICD-10-CM

## 2017-06-20 DIAGNOSIS — G8191 Hemiplegia, unspecified affecting right dominant side: Secondary | ICD-10-CM | POA: Diagnosis not present

## 2017-06-20 DIAGNOSIS — Z452 Encounter for adjustment and management of vascular access device: Secondary | ICD-10-CM | POA: Diagnosis not present

## 2017-06-20 DIAGNOSIS — Z4659 Encounter for fitting and adjustment of other gastrointestinal appliance and device: Secondary | ICD-10-CM

## 2017-06-20 DIAGNOSIS — I5022 Chronic systolic (congestive) heart failure: Secondary | ICD-10-CM | POA: Diagnosis not present

## 2017-06-20 DIAGNOSIS — Z7982 Long term (current) use of aspirin: Secondary | ICD-10-CM

## 2017-06-20 DIAGNOSIS — I63413 Cerebral infarction due to embolism of bilateral middle cerebral arteries: Secondary | ICD-10-CM | POA: Diagnosis not present

## 2017-06-20 DIAGNOSIS — R29721 NIHSS score 21: Secondary | ICD-10-CM | POA: Diagnosis not present

## 2017-06-20 DIAGNOSIS — I63011 Cerebral infarction due to thrombosis of right vertebral artery: Secondary | ICD-10-CM | POA: Diagnosis not present

## 2017-06-20 DIAGNOSIS — Z789 Other specified health status: Secondary | ICD-10-CM

## 2017-06-20 DIAGNOSIS — Z91040 Latex allergy status: Secondary | ICD-10-CM

## 2017-06-20 DIAGNOSIS — E872 Acidosis: Secondary | ICD-10-CM | POA: Diagnosis not present

## 2017-06-20 DIAGNOSIS — R29818 Other symptoms and signs involving the nervous system: Secondary | ICD-10-CM | POA: Diagnosis not present

## 2017-06-20 DIAGNOSIS — Z4682 Encounter for fitting and adjustment of non-vascular catheter: Secondary | ICD-10-CM

## 2017-06-20 DIAGNOSIS — K8020 Calculus of gallbladder without cholecystitis without obstruction: Secondary | ICD-10-CM | POA: Diagnosis not present

## 2017-06-20 DIAGNOSIS — J9690 Respiratory failure, unspecified, unspecified whether with hypoxia or hypercapnia: Secondary | ICD-10-CM

## 2017-06-20 DIAGNOSIS — I63412 Cerebral infarction due to embolism of left middle cerebral artery: Secondary | ICD-10-CM | POA: Diagnosis not present

## 2017-06-20 DIAGNOSIS — J9811 Atelectasis: Secondary | ICD-10-CM | POA: Diagnosis not present

## 2017-06-20 DIAGNOSIS — Z8249 Family history of ischemic heart disease and other diseases of the circulatory system: Secondary | ICD-10-CM

## 2017-06-20 DIAGNOSIS — I361 Nonrheumatic tricuspid (valve) insufficiency: Secondary | ICD-10-CM | POA: Diagnosis not present

## 2017-06-20 DIAGNOSIS — R404 Transient alteration of awareness: Secondary | ICD-10-CM | POA: Diagnosis not present

## 2017-06-20 DIAGNOSIS — Z888 Allergy status to other drugs, medicaments and biological substances status: Secondary | ICD-10-CM | POA: Diagnosis not present

## 2017-06-20 DIAGNOSIS — Z515 Encounter for palliative care: Secondary | ICD-10-CM | POA: Diagnosis not present

## 2017-06-20 DIAGNOSIS — I110 Hypertensive heart disease with heart failure: Secondary | ICD-10-CM | POA: Diagnosis not present

## 2017-06-20 DIAGNOSIS — Z7902 Long term (current) use of antithrombotics/antiplatelets: Secondary | ICD-10-CM

## 2017-06-20 DIAGNOSIS — T148XXA Other injury of unspecified body region, initial encounter: Secondary | ICD-10-CM | POA: Diagnosis not present

## 2017-06-20 DIAGNOSIS — I952 Hypotension due to drugs: Secondary | ICD-10-CM | POA: Diagnosis not present

## 2017-06-20 DIAGNOSIS — R0602 Shortness of breath: Secondary | ICD-10-CM

## 2017-06-20 DIAGNOSIS — Z91041 Radiographic dye allergy status: Secondary | ICD-10-CM

## 2017-06-20 DIAGNOSIS — R531 Weakness: Secondary | ICD-10-CM | POA: Diagnosis not present

## 2017-06-20 DIAGNOSIS — D62 Acute posthemorrhagic anemia: Secondary | ICD-10-CM | POA: Diagnosis not present

## 2017-06-20 DIAGNOSIS — R001 Bradycardia, unspecified: Secondary | ICD-10-CM | POA: Diagnosis not present

## 2017-06-20 DIAGNOSIS — Z66 Do not resuscitate: Secondary | ICD-10-CM | POA: Diagnosis not present

## 2017-06-20 DIAGNOSIS — Z95828 Presence of other vascular implants and grafts: Secondary | ICD-10-CM

## 2017-06-20 DIAGNOSIS — D72829 Elevated white blood cell count, unspecified: Secondary | ICD-10-CM

## 2017-06-20 DIAGNOSIS — Z781 Physical restraint status: Secondary | ICD-10-CM

## 2017-06-20 DIAGNOSIS — Z4589 Encounter for adjustment and management of other implanted devices: Secondary | ICD-10-CM | POA: Diagnosis not present

## 2017-06-20 DIAGNOSIS — R4701 Aphasia: Secondary | ICD-10-CM | POA: Diagnosis present

## 2017-06-20 DIAGNOSIS — R451 Restlessness and agitation: Secondary | ICD-10-CM | POA: Diagnosis not present

## 2017-06-20 DIAGNOSIS — I97621 Postprocedural hematoma of a circulatory system organ or structure following other procedure: Secondary | ICD-10-CM | POA: Diagnosis not present

## 2017-06-20 DIAGNOSIS — I255 Ischemic cardiomyopathy: Secondary | ICD-10-CM | POA: Diagnosis present

## 2017-06-20 DIAGNOSIS — J811 Chronic pulmonary edema: Secondary | ICD-10-CM

## 2017-06-20 DIAGNOSIS — Z87891 Personal history of nicotine dependence: Secondary | ICD-10-CM

## 2017-06-20 DIAGNOSIS — G464 Cerebellar stroke syndrome: Secondary | ICD-10-CM | POA: Diagnosis not present

## 2017-06-20 DIAGNOSIS — E876 Hypokalemia: Secondary | ICD-10-CM | POA: Diagnosis not present

## 2017-06-20 DIAGNOSIS — I6602 Occlusion and stenosis of left middle cerebral artery: Secondary | ICD-10-CM | POA: Diagnosis not present

## 2017-06-20 DIAGNOSIS — Z6834 Body mass index (BMI) 34.0-34.9, adult: Secondary | ICD-10-CM

## 2017-06-20 HISTORY — PX: RADIOLOGY WITH ANESTHESIA: SHX6223

## 2017-06-20 HISTORY — PX: IR PERCUTANEOUS ART THROMBECTOMY/INFUSION INTRACRANIAL INC DIAG ANGIO: IMG6087

## 2017-06-20 HISTORY — PX: IR ANGIO INTRA EXTRACRAN SEL COM CAROTID INNOMINATE UNI R MOD SED: IMG5359

## 2017-06-20 LAB — COMPREHENSIVE METABOLIC PANEL
ALBUMIN: 3.9 g/dL (ref 3.5–5.0)
ALT: 23 U/L (ref 14–54)
ANION GAP: 8 (ref 5–15)
AST: 26 U/L (ref 15–41)
Alkaline Phosphatase: 72 U/L (ref 38–126)
BILIRUBIN TOTAL: 0.6 mg/dL (ref 0.3–1.2)
BUN: 16 mg/dL (ref 6–20)
CO2: 22 mmol/L (ref 22–32)
Calcium: 10 mg/dL (ref 8.9–10.3)
Chloride: 108 mmol/L (ref 101–111)
Creatinine, Ser: 0.81 mg/dL (ref 0.44–1.00)
GFR calc Af Amer: >60 mL/min (ref >60)
GFR calc non Af Amer: >60 mL/min (ref >60)
GLUCOSE: 131 mg/dL — AB (ref 65–99)
POTASSIUM: 4.1 mmol/L (ref 3.5–5.1)
SODIUM: 138 mmol/L (ref 135–145)
TOTAL PROTEIN: 6.7 g/dL (ref 6.5–8.1)

## 2017-06-20 LAB — URINALYSIS, ROUTINE W REFLEX MICROSCOPIC
BILIRUBIN URINE: NEGATIVE
Glucose, UA: NEGATIVE mg/dL
Hgb urine dipstick: NEGATIVE
Ketones, ur: NEGATIVE mg/dL
Nitrite: NEGATIVE
PH: 5 (ref 5.0–8.0)
Protein, ur: NEGATIVE mg/dL
Specific Gravity, Urine: 1.032 — ABNORMAL HIGH (ref 1.005–1.030)

## 2017-06-20 LAB — I-STAT CHEM 8, ED
BUN: 20 mg/dL (ref 6–20)
CHLORIDE: 107 mmol/L (ref 101–111)
Calcium, Ion: 1.32 mmol/L (ref 1.15–1.40)
Creatinine, Ser: 0.8 mg/dL (ref 0.44–1.00)
GLUCOSE: 131 mg/dL — AB (ref 65–99)
HEMATOCRIT: 41 % (ref 36.0–46.0)
HEMOGLOBIN: 13.9 g/dL (ref 12.0–15.0)
POTASSIUM: 4.1 mmol/L (ref 3.5–5.1)
Sodium: 142 mmol/L (ref 135–145)
TCO2: 22 mmol/L (ref 22–32)

## 2017-06-20 LAB — I-STAT TROPONIN, ED: Troponin i, poc: 0.02 ng/mL (ref 0.00–0.08)

## 2017-06-20 LAB — DIFFERENTIAL
BASOS ABS: 0 10*3/uL (ref 0.0–0.1)
BASOS PCT: 0 %
Eosinophils Absolute: 0.2 10*3/uL (ref 0.0–0.7)
Eosinophils Relative: 3 %
LYMPHS PCT: 25 %
Lymphs Abs: 1.7 10*3/uL (ref 0.7–4.0)
MONO ABS: 0.5 10*3/uL (ref 0.1–1.0)
Monocytes Relative: 7 %
NEUTROS ABS: 4.3 10*3/uL (ref 1.7–7.7)
NEUTROS PCT: 65 %

## 2017-06-20 LAB — TRIGLYCERIDES: TRIGLYCERIDES: 76 mg/dL (ref <150)

## 2017-06-20 LAB — POCT I-STAT 3, ART BLOOD GAS (G3+)
Acid-base deficit: 4 mmol/L — ABNORMAL HIGH (ref 0.0–2.0)
BICARBONATE: 22.7 mmol/L (ref 20.0–28.0)
O2 Saturation: 99 %
PCO2 ART: 44.8 mmHg (ref 32.0–48.0)
TCO2: 24 mmol/L (ref 22–32)
pH, Arterial: 7.309 — ABNORMAL LOW (ref 7.350–7.450)
pO2, Arterial: 170 mmHg — ABNORMAL HIGH (ref 83.0–108.0)

## 2017-06-20 LAB — CBC
HCT: 41.6 % (ref 36.0–46.0)
Hemoglobin: 13.3 g/dL (ref 12.0–15.0)
MCH: 30.2 pg (ref 26.0–34.0)
MCHC: 32 g/dL (ref 30.0–36.0)
MCV: 94.5 fL (ref 78.0–100.0)
PLATELETS: 157 10*3/uL (ref 150–400)
RBC: 4.4 MIL/uL (ref 3.87–5.11)
RDW: 13.9 % (ref 11.5–15.5)
WBC: 6.7 10*3/uL (ref 4.0–10.5)

## 2017-06-20 LAB — RAPID URINE DRUG SCREEN, HOSP PERFORMED
AMPHETAMINES: NOT DETECTED
BARBITURATES: NOT DETECTED
BENZODIAZEPINES: POSITIVE — AB
COCAINE: NOT DETECTED
OPIATES: NOT DETECTED
TETRAHYDROCANNABINOL: NOT DETECTED

## 2017-06-20 LAB — PROTIME-INR
INR: 0.94
PROTHROMBIN TIME: 12.5 s (ref 11.4–15.2)

## 2017-06-20 LAB — MRSA PCR SCREENING: MRSA BY PCR: NEGATIVE

## 2017-06-20 LAB — CBG MONITORING, ED: GLUCOSE-CAPILLARY: 127 mg/dL — AB (ref 65–99)

## 2017-06-20 LAB — APTT: APTT: 32 s (ref 24–36)

## 2017-06-20 SURGERY — RADIOLOGY WITH ANESTHESIA
Anesthesia: General

## 2017-06-20 MED ORDER — MIDAZOLAM HCL 2 MG/2ML IJ SOLN: 1.0000 mg | INTRAMUSCULAR | Status: DC | PRN

## 2017-06-20 MED ORDER — FENTANYL CITRATE (PF) 100 MCG/2ML IJ SOLN
25.0000 ug | INTRAMUSCULAR | Status: DC | PRN
  Administered 2017-06-21: 50 ug via INTRAVENOUS
  Filled 2017-06-20: qty 2

## 2017-06-20 MED ORDER — ORAL CARE MOUTH RINSE
15.0000 mL | OROMUCOSAL | Status: DC
  Administered 2017-06-20 – 2017-06-23 (×24): 15 mL via OROMUCOSAL

## 2017-06-20 MED ORDER — FENTANYL CITRATE (PF) 100 MCG/2ML IJ SOLN
50.0000 ug | INTRAMUSCULAR | Status: DC | PRN
  Administered 2017-06-20: 50 ug via INTRAVENOUS

## 2017-06-20 MED ORDER — ORAL CARE MOUTH RINSE: 15.0000 mL | Freq: Four times a day (QID) | OROMUCOSAL | Status: DC

## 2017-06-20 MED ORDER — NICARDIPINE HCL IN NACL 20-0.86 MG/200ML-% IV SOLN
3.0000 mg/h | INTRAVENOUS | Status: DC
  Administered 2017-06-20: 5 mg/h via INTRAVENOUS
  Filled 2017-06-20: qty 200

## 2017-06-20 MED ORDER — ACETAMINOPHEN 160 MG/5ML PO SOLN
650.0000 mg | ORAL | Status: DC | PRN
  Administered 2017-06-25: 650 mg
  Filled 2017-06-20: qty 20.3

## 2017-06-20 MED ORDER — CHLORHEXIDINE GLUCONATE 0.12% ORAL RINSE (MEDLINE KIT): 15.0000 mL | Freq: Two times a day (BID) | OROMUCOSAL | Status: DC

## 2017-06-20 MED ORDER — LEVOTHYROXINE SODIUM 100 MCG PO TABS: 100.0000 ug | ORAL_TABLET | Freq: Every day | ORAL | Status: DC

## 2017-06-20 MED ORDER — DIPHENHYDRAMINE HCL 50 MG/ML IJ SOLN: 50.0000 mg | Freq: Once | INTRAMUSCULAR | Status: DC

## 2017-06-20 MED ORDER — EPTIFIBATIDE 20 MG/10ML IV SOLN
INTRAVENOUS | Status: AC
  Filled 2017-06-20: qty 10

## 2017-06-20 MED ORDER — SODIUM CHLORIDE 0.9 % IV SOLN
INTRAVENOUS | Status: DC
  Administered 2017-06-21 – 2017-06-23 (×2): via INTRAVENOUS

## 2017-06-20 MED ORDER — SERTRALINE HCL 20 MG/ML PO CONC: 100.0000 mg | Freq: Every day | ORAL | Status: DC

## 2017-06-20 MED ORDER — ACETAMINOPHEN 160 MG/5ML PO SOLN: 650.0000 mg | ORAL | Status: DC | PRN

## 2017-06-20 MED ORDER — IOPAMIDOL (ISOVUE-300) INJECTION 61%
INTRAVENOUS | Status: AC
  Administered 2017-06-20: 75 mL
  Filled 2017-06-20: qty 150

## 2017-06-20 MED ORDER — FENTANYL CITRATE (PF) 100 MCG/2ML IJ SOLN: 50.0000 ug | INTRAMUSCULAR | Status: DC | PRN

## 2017-06-20 MED ORDER — VANCOMYCIN HCL IN DEXTROSE 1-5 GM/200ML-% IV SOLN
INTRAVENOUS | Status: AC
  Filled 2017-06-20: qty 200

## 2017-06-20 MED ORDER — ALTEPLASE (STROKE) FULL DOSE INFUSION
0.9000 mg/kg | Freq: Once | INTRAVENOUS | Status: AC
  Administered 2017-06-20: 87 mg via INTRAVENOUS
  Filled 2017-06-20: qty 100

## 2017-06-20 MED ORDER — NICARDIPINE HCL IN NACL 20-0.86 MG/200ML-% IV SOLN: 0.0000 mg/h | INTRAVENOUS | Status: DC

## 2017-06-20 MED ORDER — ACETAMINOPHEN 650 MG RE SUPP: 650.0000 mg | RECTAL | Status: DC | PRN

## 2017-06-20 MED ORDER — ONDANSETRON HCL 4 MG/2ML IJ SOLN: 4.0000 mg | Freq: Four times a day (QID) | INTRAMUSCULAR | Status: DC | PRN

## 2017-06-20 MED ORDER — PROPOFOL 500 MG/50ML IV EMUL
INTRAVENOUS | Status: DC | PRN
  Administered 2017-06-20: 25 ug/kg/min via INTRAVENOUS

## 2017-06-20 MED ORDER — LIDOCAINE HCL (PF) 1 % IJ SOLN
INTRAMUSCULAR | Status: AC
  Filled 2017-06-20: qty 30

## 2017-06-20 MED ORDER — CHLORHEXIDINE GLUCONATE 0.12% ORAL RINSE (MEDLINE KIT)
15.0000 mL | Freq: Two times a day (BID) | OROMUCOSAL | Status: DC
  Administered 2017-06-20 – 2017-06-22 (×6): 15 mL via OROMUCOSAL

## 2017-06-20 MED ORDER — METHYLPREDNISOLONE SODIUM SUCC 125 MG IJ SOLR
125.0000 mg | Freq: Once | INTRAMUSCULAR | Status: AC
  Administered 2017-06-20: 125 mg via INTRAVENOUS

## 2017-06-20 MED ORDER — ACETAMINOPHEN 325 MG PO TABS: 650.0000 mg | ORAL_TABLET | ORAL | Status: DC | PRN

## 2017-06-20 MED ORDER — EPHEDRINE SULFATE 50 MG/ML IJ SOLN
INTRAMUSCULAR | Status: DC | PRN
  Administered 2017-06-20: 2.5 mg via INTRAVENOUS

## 2017-06-20 MED ORDER — DIPHENHYDRAMINE HCL 50 MG/ML IJ SOLN
50.0000 mg | Freq: Once | INTRAMUSCULAR | Status: AC
  Administered 2017-06-20: 50 mg via INTRAVENOUS

## 2017-06-20 MED ORDER — IOPAMIDOL (ISOVUE-300) INJECTION 61%
INTRAVENOUS | Status: AC
  Administered 2017-06-20: 20 mL
  Filled 2017-06-20: qty 150

## 2017-06-20 MED ORDER — PANTOPRAZOLE SODIUM 40 MG IV SOLR
40.0000 mg | Freq: Every day | INTRAVENOUS | Status: DC
  Administered 2017-06-20 – 2017-06-27 (×8): 40 mg via INTRAVENOUS
  Filled 2017-06-20 (×8): qty 40

## 2017-06-20 MED ORDER — SODIUM CHLORIDE 0.9 % IV SOLN
INTRAVENOUS | Status: DC | PRN
  Administered 2017-06-20: 10:00:00 via INTRAVENOUS

## 2017-06-20 MED ORDER — ACETAMINOPHEN 325 MG PO TABS
650.0000 mg | ORAL_TABLET | ORAL | Status: DC | PRN
  Administered 2017-07-02: 650 mg via ORAL
  Filled 2017-06-20: qty 2

## 2017-06-20 MED ORDER — NITROGLYCERIN 1 MG/10 ML FOR IR/CATH LAB
INTRA_ARTERIAL | Status: AC
  Administered 2017-06-20 (×2): 25 ug
  Filled 2017-06-20: qty 10

## 2017-06-20 MED ORDER — SUCCINYLCHOLINE CHLORIDE 20 MG/ML IJ SOLN
INTRAMUSCULAR | Status: DC | PRN
  Administered 2017-06-20: 120 mg via INTRAVENOUS

## 2017-06-20 MED ORDER — PROPOFOL 1000 MG/100ML IV EMUL
5.0000 ug/kg/min | INTRAVENOUS | Status: AC
  Administered 2017-06-20: 40 ug/kg/min via INTRAVENOUS
  Filled 2017-06-20 (×2): qty 100

## 2017-06-20 MED ORDER — SODIUM CHLORIDE 0.9 % IV SOLN
0.0000 ug/min | INTRAVENOUS | Status: DC
  Administered 2017-06-20: 80 ug/min via INTRAVENOUS
  Administered 2017-06-21: 5 ug/min via INTRAVENOUS
  Administered 2017-06-21: 10 ug/min via INTRAVENOUS
  Filled 2017-06-20 (×3): qty 1

## 2017-06-20 MED ORDER — SODIUM CHLORIDE 0.9 % IV SOLN: INTRAVENOUS | Status: DC

## 2017-06-20 MED ORDER — STROKE: EARLY STAGES OF RECOVERY BOOK
Freq: Once | Status: AC
  Administered 2017-06-20: 1
  Filled 2017-06-20: qty 1

## 2017-06-20 MED ORDER — PANTOPRAZOLE SODIUM 40 MG PO PACK: 40.0000 mg | PACK | Freq: Every day | ORAL | Status: DC

## 2017-06-20 MED ORDER — DEXTROSE 5 % IV SOLN
INTRAVENOUS | Status: DC | PRN
  Administered 2017-06-20: 20 ug/min via INTRAVENOUS

## 2017-06-20 MED ORDER — ROCURONIUM BROMIDE 100 MG/10ML IV SOLN
INTRAVENOUS | Status: DC | PRN
  Administered 2017-06-20: 25 mg via INTRAVENOUS
  Administered 2017-06-20: 50 mg via INTRAVENOUS
  Administered 2017-06-20: 25 mg via INTRAVENOUS

## 2017-06-20 MED ORDER — DIPHENHYDRAMINE HCL 50 MG/ML IJ SOLN
INTRAMUSCULAR | Status: AC
  Filled 2017-06-20: qty 1

## 2017-06-20 MED ORDER — ACETAMINOPHEN 650 MG RE SUPP
650.0000 mg | RECTAL | Status: DC | PRN
  Administered 2017-06-25: 650 mg via RECTAL
  Filled 2017-06-20: qty 1

## 2017-06-20 MED ORDER — SERTRALINE HCL 50 MG PO TABS: 100.0000 mg | ORAL_TABLET | Freq: Every day | ORAL | Status: DC

## 2017-06-20 MED ORDER — GLYCOPYRROLATE 0.2 MG/ML IJ SOLN
INTRAMUSCULAR | Status: DC | PRN
  Administered 2017-06-20 (×2): 0.2 mg via INTRAVENOUS

## 2017-06-20 MED ORDER — FAMOTIDINE IN NACL 20-0.9 MG/50ML-% IV SOLN
20.0000 mg | Freq: Two times a day (BID) | INTRAVENOUS | Status: DC
  Administered 2017-06-20 – 2017-07-01 (×22): 20 mg via INTRAVENOUS
  Filled 2017-06-20 (×22): qty 50

## 2017-06-20 MED ORDER — PROPOFOL 1000 MG/100ML IV EMUL
0.0000 ug/kg/min | INTRAVENOUS | Status: DC
  Administered 2017-06-20: 40 ug/kg/min via INTRAVENOUS
  Administered 2017-06-21: 30 ug/kg/min via INTRAVENOUS
  Administered 2017-06-21: 25 ug/kg/min via INTRAVENOUS
  Administered 2017-06-21: 40 ug/kg/min via INTRAVENOUS
  Administered 2017-06-21: 50 ug/kg/min via INTRAVENOUS
  Administered 2017-06-21: 35 ug/kg/min via INTRAVENOUS
  Administered 2017-06-21: 10 ug/kg/min via INTRAVENOUS
  Administered 2017-06-21: 35 ug/kg/min via INTRAVENOUS
  Administered 2017-06-22: 30 ug/kg/min via INTRAVENOUS
  Filled 2017-06-20 (×8): qty 100

## 2017-06-20 MED ORDER — SODIUM CHLORIDE 0.9 % IV SOLN
INTRAVENOUS | Status: DC | PRN
  Administered 2017-06-20: 1000 mg via INTRAVENOUS

## 2017-06-20 MED ORDER — METHYLPREDNISOLONE SODIUM SUCC 125 MG IJ SOLR
INTRAMUSCULAR | Status: AC
  Filled 2017-06-20: qty 2

## 2017-06-20 MED ORDER — PROPOFOL 10 MG/ML IV BOLUS
INTRAVENOUS | Status: DC | PRN
  Administered 2017-06-20: 180 mg via INTRAVENOUS

## 2017-06-20 MED ORDER — FENTANYL CITRATE (PF) 100 MCG/2ML IJ SOLN
INTRAMUSCULAR | Status: AC
  Administered 2017-06-20: 50 ug via INTRAVENOUS
  Filled 2017-06-20: qty 2

## 2017-06-20 MED ORDER — ORAL CARE MOUTH RINSE
15.0000 mL | Freq: Four times a day (QID) | OROMUCOSAL | Status: DC
  Administered 2017-06-20: 15 mL via OROMUCOSAL

## 2017-06-20 NOTE — Progress Notes (Signed)
Pt log rolled to place a clean pad under her - old bloody drainage noted to pt's thighs & under her bottom- R femoral site CDI - no swelling noted - soft - arterial sheath remains in place per report - pt to remain flat

## 2017-06-20 NOTE — H&P (Signed)
Neurology H&P  CC: Right sided weakness  History is obtained from:patient  HPI: Melinda Noble is a 83 y.o. female with a  History of afib, not on anti-coagulants who presents with right sided weakness. She was initially plegic on the right, but by the time of arrival to the ED, was having significant improvement.   Her husband states that he spoke with her while he was getting gout of bed and she was normal, she then was up getting ready when he heard her fall.    LKW: 6:30 am .  tpa given?: yes Modified Rankin Score: 0 VAN: positive  ROS: A 14 point ROS was performed and is negative except as noted in the HPI.   Past Medical History:  Diagnosis Date  . Anemia   . CAD (coronary artery disease)    a. STEMI 10/09/13 - 100% LAD - following initial angioplasty, ? Spontaneous dissection vs related to ballon. s/p complex PCI with 2 overlapping DES to LAD. Adm complicated by VDRF, cardiogenic shock, peri-MI PAF with NSR on amiodarone. Residual mod RCA disease.   Marland Kitchen CAD S/P percutaneous coronary angioplasty 10/09/13   DES x 2 to Mid LAD, 2.5 x 38, 2.5 x 12 Promus (covering extensive disection), moderate RCA disesase  . Cardiogenic shock ([place])    a. 09/2013 - during admission for STEMI.  . Depression   . Dyslipidemia   . H/O Acute respiratory failure due to hypoxia 10/10/2013   a. 09/2013 - secondary to anterior STEMI related pulmonary edema (resolved).  . Hypertension   . Hypothyroidism   . Ischemic cardiomyopathy    a. EF 30-35% during 09/2013 adm for STEMI. b. Repeat Echo EF 40-45% with distal LAD hypokinesis (10/16/14 following STEMI recovery).  . Migraine headache    a. Remotely.  . Orthostatic hypotension    a. Adm 10/2013 for this, felt due to Lasix. Not on regular diuretic due to this/dehydration.  Marland Kitchen PAF (paroxysmal atrial fibrillation) ([place])    a. 09/2013 - peri-MI, NSR at discharge on amiodarone.  . Sinus bradycardia      Family History  Problem Relation Age of Onset  .  Varicose Veins Father   . Heart attack Father   . Heart failure Mother   . Anemia Mother      Social History:  reports that she has quit smoking. She has never used smokeless tobacco. She reports that she does not drink alcohol or use drugs.   Exam: Current vital signs: Wt 96.2 kg (212 lb 1.3 oz) Comment: Per ED weighing bed prior to CT  BMI 34.23 kg/m  Vital signs in last 24 hours: Weight:  [96.2 kg (212 lb 1.3 oz)] 96.2 kg (212 lb 1.3 oz) (09/02 0800)  Physical Exam  Constitutional: Appears well-developed and well-nourished.  Psych: Affect appropriate to situation Eyes: No scleral injection HENT: Large right cheek hematoma.  Head: Normocephalic. , no neck tenderness.  Cardiovascular: Normal rate and regular rhythm.  Respiratory: Effort normal and breath sounds normal to anterior ascultation GI: Soft.  No distension. There is no tenderness.  Skin: WDI  Neuro: Mental Status: Patient is awake, alert, oriented to person, place, month, gives age as 61.  She has a significant aphasia, but is able to answer Questions appropriately with short answers Cranial Nerves: II: Visual Fields are full. Pupils are equal, round, and reactive to light.   III,IV, VI: EOMI without ptosis or diploplia.  V: Facial sensation is symmetric to temperature VII: Facial movement with right facial  weakness VIII: hearing is intact to voice X: Uvula elevates symmetrically XI: Shoulder shrug is symmetric. XII: tongue is midline without atrophy or fasciculations.  Motor: Tone is normal. Bulk is normal. She has 4/5 weakness of the right arm 3/5 weakness of the right leg Sensory: Sensation is symmetric to light touch and temperature in the arms and legs. Cerebellar: No clear ataxia  Following the above exam she was taken for MRI and then after MRI it was noted that she was severely worse  I have reviewed labs in epic and the results pertinent to this consultation are: MP-unremarkable CBC-normal  I  have reviewed the images obtained: MRA-likely trickle flow in the MCA on the left, no large areas of infarction  Impression: 83 year old female who was previously completely independent who presents with left MCA syndrome. I suspect that her initial improvement was partial recanalization and that her subsequent re-worsening here represented reocclusion. She was taken for stat CT to rule out hemorrhage given her abrupt change, but this is negative and therefore she was taken for IR.   Recommendations: 1. HgbA1c, fasting lipid panel 2. MRI of the brain without contrast 3. Frequent neuro checks 4. Echocardiogram 5. Prophylactic therapy-Antiplatelet med: Aspirin - dose 325mg  PO or 300mg  PR 6. Risk factor modification 7. Telemetry monitoring 8. PT consult, OT consult, Speech consult 9. Continue synthroid 10. please page stroke NP  Or  PA  Or MD  from 8am -4 pm as this patient will be followed by the stroke team at this point.   You can look them up on [email or web address]      This patient is critically ill and at significant risk of neurological worsening, death and care requires constant monitoring of vital signs, hemodynamics,respiratory and cardiac monitoring, neurological assessment, discussion with family, other specialists and medical decision making of high complexity. I spent 90 minutes of neurocritical care time  in the care of  this patient.  Roland Rack, MD [facility] [phone]  If 7pm- 7am, please page neurology on call as listed in [place]. 06/20/2017  9:07 AM

## 2017-06-20 NOTE — Anesthesia Postprocedure Evaluation (Signed)
Anesthesia Post Note  Patient: Melinda Noble  Procedure(s) Performed: Procedure(s) (LRB): RADIOLOGY WITH ANESTHESIA (N/A)     Patient location during evaluation: PACU Anesthesia Type: General Level of consciousness: patient remains intubated per anesthesia plan Pain management: pain level controlled Vital Signs Assessment: post-procedure vital signs reviewed and stable Respiratory status: patient remains intubated per anesthesia plan Cardiovascular status: stable Anesthetic complications: no    Last Vitals:  Vitals:   06/20/17 1218 06/20/17 1220  BP:  (!) 155/64  Pulse: (!) 58 61  Resp: 14 13  Temp: (!) (P) 36.1 C   SpO2: 96% 97%    Last Pain: There were no vitals filed for this visit.               Elisabetta Mishra

## 2017-06-20 NOTE — Progress Notes (Signed)
eLink Physician-Brief Progress Note Patient Name: Melinda Noble DOB: Sep 10, 1930 MRN: [id number]   Date of Service  06/20/2017  HPI/Events of Note  83 year old female with history of atrial fibrillation. Not on anticoagulants. Right-sided hemiplegia. Taken emergently to interventional radiology with angiogram and revascularization of left MCA. Transported back to the intensive care unit on mechanical ventilation. Previously on nicardipine infusion for blood pressure control. Sedated with propofol.   eICU Interventions  1. Awaiting postintubation ABG 2. Checking stat portable chest x-ray 3. Sedation and ventilator orders modified to reflect current goals and parameters 4. Intensivist to assess patient at bedside once they become available      Intervention Category Evaluation Type: New Patient Evaluation  Tera Partridge 06/20/2017, 3:23 PM

## 2017-06-20 NOTE — ED Notes (Signed)
Spoke with patient placement regarding pts status, made them aware that patient is in IR and should not be coming back to ED prior to going up to ICU. Pt placement advised they are planning to send patient to 4N as soon as the bed they are waiting for is open.

## 2017-06-20 NOTE — ED Notes (Signed)
Pt transported to IR with this RN, EDT and stroke team

## 2017-06-20 NOTE — ED Triage Notes (Signed)
Pt arrives via [facility], pt was up getting dressed for church this am when she had a fall, hematoma to right eye, pt found to have slurred speech, flaccid on right side of body per EMS report. pts right side improved some en route, pt remains weak on right side with slurred speech and right facial droop. 18 g iv x2 in place, VSS 176/98, hr 60, cbg 134.

## 2017-06-20 NOTE — ED Notes (Signed)
Pt arrived to MRI with this RN, EDT and stroke team

## 2017-06-20 NOTE — Progress Notes (Signed)
Pt transported from CT to [facility] via ventilator on 100%. Pt remained stable thoughout transport. Report given to Kal RRT.

## 2017-06-20 NOTE — ED Notes (Signed)
Pt tachypneic, diaphoretic and unable to lie still. Pt placed on NRB at 15L due to O2 sat 75% on [place]

## 2017-06-20 NOTE — ED Notes (Signed)
O2 sat fluctuating between 87-91, pt placed on 2L O2 via [place] per Dr. Cecil Cobbs order

## 2017-06-20 NOTE — ED Notes (Addendum)
Pt transported back to IR by this RN, EDT and stroke team, groins shaved and pedal pulses marked bilaterally. Pt transported to IR table and IR team at bedside to take over care of patient at this time

## 2017-06-20 NOTE — Anesthesia Procedure Notes (Signed)
Procedure Name: Intubation Date/Time: 06/20/2017 9:57 AM Performed by: Izora Gala Pre-anesthesia Checklist: Patient identified, Emergency Drugs available, Suction available and Patient being monitored Patient Re-evaluated:Patient Re-evaluated prior to induction Oxygen Delivery Method: Circle system utilized Preoxygenation: Pre-oxygenation with 100% oxygen Induction Type: IV induction and Cricoid Pressure applied Ventilation: Mask ventilation without difficulty Laryngoscope Size: Glidescope (Elective Glidescope) Grade View: Grade I Tube type: Subglottic suction tube Tube size: 8.0 mm Number of attempts: 1 Airway Equipment and Method: Stylet Placement Confirmation: ETT inserted through vocal cords under direct vision,  positive ETCO2 and breath sounds checked- equal and bilateral Tube secured with: Tape Dental Injury: Teeth and Oropharynx as per pre-operative assessment

## 2017-06-20 NOTE — Anesthesia Preprocedure Evaluation (Signed)
Anesthesia Evaluation  Patient identified by MRN, date of birth, ID band Patient unresponsive    Reviewed: Unable to perform ROS - Chart review only  Airway Mallampati: II  TM Distance: >3 FB     Dental   Pulmonary former smoker,    breath sounds clear to auscultation (-) decreased breath sounds      Cardiovascular hypertension, + CAD   Rhythm:Regular Rate:Normal     Neuro/Psych  Headaches,    GI/Hepatic   Endo/Other  Hypothyroidism   Renal/GU      Musculoskeletal   Abdominal   Peds  Hematology  (+) anemia ,   Anesthesia Other Findings   Reproductive/Obstetrics                             Anesthesia Physical Anesthesia Plan  ASA: IV  Anesthesia Plan: General   Post-op Pain Management:    Induction: Intravenous, Rapid sequence and Cricoid pressure planned  PONV Risk Score and Plan: 3 and Ondansetron, Dexamethasone, Propofol infusion and Treatment may vary due to age or medical condition  Airway Management Planned: Oral ETT  Additional Equipment:   Intra-op Plan:   Post-operative Plan: Post-operative intubation/ventilation  Informed Consent: I have reviewed the patients History and Physical, chart, labs and discussed the procedure including the risks, benefits and alternatives for the proposed anesthesia with the patient or authorized representative who has indicated his/her understanding and acceptance.   Dental advisory given  Plan Discussed with: CRNA, Anesthesiologist and Surgeon  Anesthesia Plan Comments:         Anesthesia Quick Evaluation

## 2017-06-20 NOTE — ED Notes (Signed)
Patient transported back to CT due to decline in mental stats with this RN, EDT and stroke team

## 2017-06-20 NOTE — Consult Note (Signed)
PULMONARY / CRITICAL CARE MEDICINE   Name: Melinda Noble MRN: [id number] DOB: 12/07/1929    ADMISSION DATE:  06/20/2017 CONSULTATION DATE:  9/2  REFERRING [id number]   CHIEF COMPLAINT:   Vent management post CVA critical care   HISTORY OF PRESENT ILLNESS:   This is a 83 year old female w/ sig h/o atrial fib (not on systemic A/C). Presented to the ER on 9/2 w/ new (R) sided hemiplegia (she was independent w/ ADLs prior to this).  ER course: MRA reviewed-->felt trickle flow to left MCA but no areas of infarct.  -received TPA in the ER-->clinically actually declined.  -repeat CT head-->negative for ICH -went to IR: for cerebral arteriogram and endo vascularization.  -returned to the ICU on vent. PCCM asked to a/w care   PAST MEDICAL HISTORY :  She  has a past medical history of Anemia; CAD (coronary artery disease); CAD S/P percutaneous coronary angioplasty (10/09/13); Cardiogenic shock ([place]); Depression; Dyslipidemia; H/O Acute respiratory failure due to hypoxia (10/10/2013); Hypertension; Hypothyroidism; Ischemic cardiomyopathy; Migraine headache; Orthostatic hypotension; PAF (paroxysmal atrial fibrillation) ([place]); and Sinus bradycardia.  PAST SURGICAL HISTORY: She  has a past surgical history that includes Shoulder surgery; Coronary angioplasty with stent (10/09/13); and left heart catheterization with coronary angiogram (N/A, 10/09/2013).  Allergies  Allergen Reactions  . Ace Inhibitors Anaphylaxis  . Beta Adrenergic Blockers Anaphylaxis  . Iohexol Anaphylaxis     Code: HIVES, Desc: anaphylactic shock s/p contrast injection many yrs ago--suggested that pt NEVER have iv contrast//a.c., Onset Date: [id number]   . Atorvastatin Rash  . Latex Rash  . Penicillins Swelling and Rash  . Sulfa Antibiotics Rash    Current Facility-Administered Medications on File Prior to Encounter  Medication  . etomidate (AMIDATE) injection  . rocuronium (ZEMURON) injection  . succinylcholine  (ANECTINE) injection   Current Outpatient Prescriptions on File Prior to Encounter  Medication Sig  . ALPRAZolam (XANAX) 0.25 MG tablet TAKE (1) TABLET TWICE DAILY AS NEEDED FOR ANXIETY.  Marland Kitchen aspirin 81 MG chewable tablet Chew 1 tablet (81 mg total) by mouth daily.  . Biotin 1000 MCG tablet Take 1,000 mcg by mouth daily.  Marland Kitchen buPROPion (WELLBUTRIN SR) 150 MG 12 hr tablet Take 150 mg by mouth daily.   . calcium-vitamin D (OSCAL WITH D) 500-200 MG-UNIT per tablet Take 1 tablet by mouth daily.   . carvedilol (COREG) 6.25 MG tablet Take 1 tablet (6.25 mg total) by mouth 2 (two) times daily with a meal.  . cholecalciferol (VITAMIN D) 1000 UNITS tablet Take 1,000 Units by mouth daily.  . clobetasol ointment (TEMOVATE) 9.51 % Apply 1 application topically 2 (two) times daily as needed. (affected areas)--also thighs  . furosemide (LASIX) 20 MG tablet TAKE 1/2 TABLET DAILY.  Marland Kitchen losartan (COZAAR) 100 MG tablet Take 1 tablet (100 mg total) by mouth daily.  Marland Kitchen omeprazole (PRILOSEC) 20 MG capsule Take 20 mg by mouth daily.   . rosuvastatin (CRESTOR) 5 MG tablet Take 10 mg by mouth 3 (three) times a week. (Mondays, Wednesdays, and Fridays)  . sertraline (ZOLOFT) 100 MG tablet Take 100 mg by mouth daily.   Marland Kitchen SYNTHROID 100 MCG tablet TAKE 1 TABLET IN THE MORNING ON AN EMPTY STOMACH.  . vitamin E (VITAMIN E) 400 UNIT capsule Take 400 Units by mouth daily.    FAMILY HISTORY:  Her indicated that her mother is deceased. She indicated that her father is deceased. She indicated that her maternal grandmother is deceased. She indicated that her maternal grandfather  is deceased. She indicated that her paternal grandmother is deceased. She indicated that her paternal grandfather is deceased.    SOCIAL HISTORY: She  reports that she has quit smoking. She has never used smokeless tobacco. She reports that she does not drink alcohol or use drugs.  REVIEW OF SYSTEMS:   Unable   SUBJECTIVE:  Heavily sedated on vent    VITAL SIGNS: BP (!) 143/75   Pulse (!) 49   Temp (!) 97.2 F (36.2 C)   Resp 14   Wt 212 lb 1.3 oz (96.2 kg) Comment: Per ED weighing bed prior to CT  SpO2 100%   BMI 34.23 kg/m   HEMODYNAMICS:    VENTILATOR SETTINGS: Vent Mode: PRVC FiO2 (%):  [90 %-100 %] 90 % Set Rate:  [14 bmp] 14 bmp Vt Set:  [550 mL] 550 mL PEEP:  [5 cmH20] 5 cmH20 Plateau Pressure:  [17 cmH20-19 cmH20] 17 cmH20  INTAKE / OUTPUT: No intake/output data recorded.  PHYSICAL EXAMINATION: General appearance:  [age]Year old  female, well nourished, heavily sedated on vent Eyes: anicteric sclerae, moist conjunctivae;right periorbital ecchymosis  Mouth:  membranes and no mucosal ulcerations; normal hard and soft palate, prally intubated Neck: Trachea midline; neck supple, no JVD Lungs/chest: CTA, with normal respiratory effort and no intercostal retractions CV: Regular irreg, no MRGs   Abdomen: Soft, non-tender; no masses or HSM Extremities: No peripheral edema or extremity lymphadenopathy Skin: Normal temperature, turgor and texture; no rash, ulcers or subcutaneous nodules Psych: sedated. Will cough. No sig motor currently but on diprivan gtt  LABS:  BMET  Recent Labs Lab 06/20/17 0819 06/20/17 0827  NA 138 142  K 4.1 4.1  CL 108 107  CO2 22  --   BUN 16 20  CREATININE 0.81 0.80  GLUCOSE 131* 131*    Electrolytes  Recent Labs Lab 06/20/17 0819  CALCIUM 10.0    CBC  Recent Labs Lab 06/20/17 0819 06/20/17 0827  WBC 6.7  --   HGB 13.3 13.9  HCT 41.6 41.0  PLT 157  --     Coag's  Recent Labs Lab 06/20/17 0819  APTT 32  INR 0.94    Sepsis Markers No results for input(s): LATICACIDVEN, PROCALCITON, O2SATVEN in the last 168 hours.  ABG  Recent Labs Lab 06/20/17 1522  PHART 7.309*  PCO2ART 44.8  PO2ART 170.0*    Liver Enzymes  Recent Labs Lab 06/20/17 0819  AST 26  ALT 23  ALKPHOS 72  BILITOT 0.6  ALBUMIN 3.9    Cardiac Enzymes No results for  input(s): TROPONINI, PROBNP in the last 168 hours.  Glucose  Recent Labs Lab 06/20/17 0817  GLUCAP 127*    Imaging Ct Head Wo Contrast  Result Date: 06/20/2017 CLINICAL DATA:  Status post revascularization of the left MCA. Right-sided defects. EXAM: CT HEAD WITHOUT CONTRAST TECHNIQUE: Contiguous axial images were obtained from the base of the skull through the vertex without intravenous contrast. COMPARISON:  CT head without contrast of the same day. FINDINGS: Brain: A focal density is noted posteriorly within the left sylvian fissure. This may represent contrast adjacent to a distal occlusion. There was faint density in this area on the earlier study in retrospect. No hemorrhage or mass lesion is present. Diffuse atrophy and white matter changes are otherwise stable. No focal cortical defects are present. The ventricles are stable. Vascular: Contrast is now noted within the major intracranial artery is. Distal left MCA lesion may be present as discussed above. Skull:  Calvarium is intact. No focal lytic or blastic lesions are present. Sinuses/Orbits: The paranasal sinuses and mastoid air cells are clear. Bilateral lens replacements are present. The globes and orbits are otherwise normal. Other: Right infraorbital hematoma has expanded. IMPRESSION: 1. Focal density posteriorly in the left sylvian fissure may reflect contrast associated with a more distal occlusion. 2. No acute hemorrhage. 3. No evidence for progressing infarct. 4. Stable atrophy and white matter disease. 5. Increasing size of right infraorbital hematoma. Electronically Signed   By: San Morelle M.D.   On: 06/20/2017 12:48   [name] Head Wo Contrast  Result Date: 06/20/2017 CLINICAL DATA:  Acute onset of right-sided weakness. Code stroke. Fall with right facial hematoma. EXAM: MRI HEAD WITHOUT CONTRAST MRA HEAD WITHOUT CONTRAST TECHNIQUE: Multiplanar, multiecho pulse sequences of the brain and surrounding structures were obtained  without intravenous contrast. Angiographic images of the head were obtained using MRA technique without contrast. COMPARISON:  CT head without contrast from the same day. FINDINGS: MRI HEAD FINDINGS Diffusion only imaging was requested by Dr. Leonel Ramsay. The do pack scratched at the axial diffusion weighted images are mildly degraded by patient motion. There is an ill-defined area of potential restricted diffusion involving the left centrum semi ovale measuring 12 x 38 mm. No definite cortical lesions are present. There is no evidence for parenchymal hemorrhage. Right periorbital soft tissue hematoma is again noted. MRA HEAD FINDINGS The time-of-flight images are moderately degraded by patient motion. Flow is present in the internal carotid artery is through the ICA termini bilaterally. The A1 segments are patent bilaterally. ACA vessels are seen bilaterally. There is some narrowing of the distal right M1 segment. There is significant signal loss in the distal left M1 segment after an early inferior branch. Left MCA branch vessels are not visualized. There is marked attenuation of right MCA branch vessels. The left vertebral artery is the dominant vessel. The basilar artery is patent. Both posterior cerebral arteries originate from basilar tip. PCA branch vessels are attenuated bilaterally. IMPRESSION: 1. High-grade stenosis or occlusion of the distal left M1 segment with nonvisualization of distal MCA branch vessels. This corresponds with the patient's acute symptoms, likely representing an emergent large vessel occlusion. 2. Narrowing of the right M1 segment and attenuation of MCA branch vessels likely related to atherosclerotic disease. 3. Posterior circulation is intact. 4. Ill-defined area of restricted diffusion within white matter of the left centrum semi ovale may reflect acute ischemia. No definite cortical infarct is present. These results were called by telephone at the time of interpretation on 06/20/2017  at 9:28 am to Dr. Roland Rack , who verbally acknowledged these results. Electronically Signed   By: San Morelle M.D.   On: 06/20/2017 09:40   Mr Brain Wo Contrast  Result Date: 06/20/2017 CLINICAL DATA:  Acute onset of right-sided weakness. Code stroke. Fall with right facial hematoma. EXAM: MRI HEAD WITHOUT CONTRAST MRA HEAD WITHOUT CONTRAST TECHNIQUE: Multiplanar, multiecho pulse sequences of the brain and surrounding structures were obtained without intravenous contrast. Angiographic images of the head were obtained using MRA technique without contrast. COMPARISON:  CT head without contrast from the same day. FINDINGS: MRI HEAD FINDINGS Diffusion only imaging was requested by Dr. Leonel Ramsay. The do pack scratched at the axial diffusion weighted images are mildly degraded by patient motion. There is an ill-defined area of potential restricted diffusion involving the left centrum semi ovale measuring 12 x 38 mm. No definite cortical lesions are present. There is no evidence for parenchymal hemorrhage.  Right periorbital soft tissue hematoma is again noted. MRA HEAD FINDINGS The time-of-flight images are moderately degraded by patient motion. Flow is present in the internal carotid artery is through the ICA termini bilaterally. The A1 segments are patent bilaterally. ACA vessels are seen bilaterally. There is some narrowing of the distal right M1 segment. There is significant signal loss in the distal left M1 segment after an early inferior branch. Left MCA branch vessels are not visualized. There is marked attenuation of right MCA branch vessels. The left vertebral artery is the dominant vessel. The basilar artery is patent. Both posterior cerebral arteries originate from basilar tip. PCA branch vessels are attenuated bilaterally. IMPRESSION: 1. High-grade stenosis or occlusion of the distal left M1 segment with nonvisualization of distal MCA branch vessels. This corresponds with the patient's  acute symptoms, likely representing an emergent large vessel occlusion. 2. Narrowing of the right M1 segment and attenuation of MCA branch vessels likely related to atherosclerotic disease. 3. Posterior circulation is intact. 4. Ill-defined area of restricted diffusion within white matter of the left centrum semi ovale may reflect acute ischemia. No definite cortical infarct is present. These results were called by telephone at the time of interpretation on 06/20/2017 at 9:28 am to Dr. Roland Rack , who verbally acknowledged these results. Electronically Signed   By: San Morelle M.D.   On: 06/20/2017 09:40   Ct Head Code Stroke Wo Contrast  Result Date: 06/20/2017 CLINICAL DATA:  Code stroke. Change in mental status while in the MRI scanner. EXAM: CT HEAD WITHOUT CONTRAST TECHNIQUE: Contiguous axial images were obtained from the base of the skull through the vertex without intravenous contrast. COMPARISON:  None. FINDINGS: Brain: The study is moderately degraded by patient motion. There is no significant change in atrophy white matter disease. No new cortical lesions are present. Asymmetric white matter hypoattenuation is present in the left corona radiata. Vascular: No definite hyperdense vessel. Skull: Calvarium is intact. Right periorbital soft tissue hematoma has not changed significantly. Sinuses/Orbits: The paranasal sinuses and mastoid air cells are clear. Globes and orbits are otherwise unremarkable. ASPECTS [facility] Stroke Program Early CT Score) - Ganglionic level infarction (caudate, lentiform nuclei, internal capsule, insula, M1-M3 cortex): 7/7 - Supraganglionic infarction (M4-M6 cortex): 3/3 Total score (0-10 with 10 being normal): 10/10 IMPRESSION: 1. No significant interval change. 2. Stable atrophy and white matter disease. 3. No definite hyperdense vessel. 4. Stable right periorbital hematoma. 5. ASPECTS is 10/10 These results were called by telephone at the time of interpretation  on 06/20/2017 at 10:05 am to Dr. Roland Rack , who verbally acknowledged these results. Electronically Signed   By: San Morelle M.D.   On: 06/20/2017 10:05   Ct Head Code Stroke Wo Contrast  Result Date: 06/20/2017 CLINICAL DATA:  Code stroke. Acute onset of right-sided weakness and right facial droop with slurred speech. Fall. Right periorbital hematoma. EXAM: CT HEAD WITHOUT CONTRAST TECHNIQUE: Contiguous axial images were obtained from the base of the skull through the vertex without intravenous contrast. COMPARISON:  CT head without contrast 01/27/2013 FINDINGS: Brain: Mild atrophy and white matter changes are present bilaterally. The basal ganglia are intact. Insular ribbon is normal bilaterally. No acute or focal cortical abnormality is present. Vascular: Atherosclerotic calcifications are present within the cavernous internal carotid artery is bilaterally. There is no hyperdense vessel. Skull: The calvarium is intact. No focal lytic or blastic lesions are present. Sinuses/Orbits: The paranasal sinuses and mastoid air cells are clear. The patient is status post bilateral  lens replacements. May prominent right infraorbital hematoma is present without an underlying fracture. Zygomatic arch and orbital rim are intact. ASPECTS [facility] Stroke Program Early CT Score) - Ganglionic level infarction (caudate, lentiform nuclei, internal capsule, insula, M1-M3 cortex): 7/7 - Supraganglionic infarction (M4-M6 cortex): 3/3 Total score (0-10 with 10 being normal): 10/10 IMPRESSION: 1. Stable atrophy and white matter disease. 2. No acute intracranial abnormality or significant interval change. 3. Prominent right infraorbital and lateral soft tissue hematoma without underlying fracture or intraorbital extension. 4. ASPECTS is 10/10 These results were called by telephone at the time of interpretation on 06/20/2017 at 8:28 am to Dr. Leonel Ramsay, who verbally acknowledged these results. Electronically Signed    By: San Morelle M.D.   On: 06/20/2017 08:35   Ct Maxillofacial Wo Contrast  Result Date: 06/20/2017 CLINICAL DATA:  Right periorbital hematoma after falling and hitting the right side of her face. EXAM: CT MAXILLOFACIAL WITHOUT CONTRAST TECHNIQUE: Multidetector CT imaging of the maxillofacial structures was performed. Multiplanar CT image reconstructions were also generated. COMPARISON:  Head CT obtained earlier today. FINDINGS: Osseous: No fractures. Orbits: Status post bilateral cataract extraction. Sinuses: Normally pneumatized. Soft tissues: Right infraorbital and periorbital hematoma. Limited intracranial: Per the head CT report earlier today. IMPRESSION: 1. No maxillofacial fracture. 2. Right infraorbital and periorbital soft tissue hematoma. Electronically Signed   By: Claudie Revering M.D.   On: 06/20/2017 09:02     STUDIES:  CT head 9/2: 1. Focal density posteriorly in the left sylvian fissure may reflect contrast associated with a more distal occlusion. 2. No acute hemorrhage. 3. No evidence for progressing infarct. 4. Stable atrophy and white matter disease. 5. Increasing size of right infraorbital hematoma. MCA 9/2:  High grade stenosis or occlusion of the distal M1 seg w/ nonvisualization of the distal MCA branch   CULTURES:   ANTIBIOTICS:   SIGNIFICANT EVENTS:   LINES/TUBES: oett 9/2>>>  DISCUSSION: Acute left MCA stroke w/ progression in spite of TPA. Now s/p IR for endovascularization.  Currently sedated Plan Supportive care F/u cxr Pad protocol  Assess in am and collaborate w/ stroke as well as IR team for extubation   ASSESSMENT / PLAN:  PULMONARY A: Need for mechanical ventilation for altered mental status  abg reviewed PCXR still pending P:   Full vent support  PAD protocol w/ RASS goal -2 Fu PCXR Assess for weaning daily effective 9/3 in collaboration w/ stroke team   CARDIOVASCULAR A:  Mild HTN  H/o afib P:  SBP goal per stroke team  (120-140) Cont tele monitoring Holding, asa, crestor, cozaar, lasix and coreg -->resume when able   RENAL A:   No acute  P:   Trend chem  Strict I&O  GASTROINTESTINAL A:   Inadequate oral intake due to mental status  P:   NPO PPI  HEMATOLOGIC A:   No acute w/out evidence of bleeding  P:  Trend cbc Ac per neuro  Transfuse per protocol   INFECTIOUS A:   No evidence of infection  P:   Trend fever and wbc   ENDOCRINE A:   Hypothyroidism  P:   Cont synthroid  NEUROLOGIC A:   Left MCA stroke w/ right sided hemiplegia. Now s/p TPA and revascularization in IR P:   RASS goal: -2 PAD protocol    FAMILY  - Updates:   - Inter-disciplinary family meet or Palliative Care meeting due by: 9/9  Erick Colace ACNP-BC [place] Pager # [phone] OR # [phone] if no  answer   06/20/2017, 3:34 PM

## 2017-06-20 NOTE — Transfer of Care (Signed)
Immediate Anesthesia Transfer of Care Note  Patient: Melinda Noble  Procedure(s) Performed: Procedure(s): RADIOLOGY WITH ANESTHESIA (N/A)  Patient Location: PACU  Anesthesia Type:General  Level of Consciousness: Patient remains intubated per anesthesia plan  Airway & Oxygen Therapy: Patient remains intubated per anesthesia plan and Patient placed on Ventilator (see vital sign flow sheet for setting)  Post-op Assessment: Report given to RN and Post -op Vital signs reviewed and stable  Post vital signs: Reviewed and stable  Last Vitals:  Vitals:   06/20/17 1218 06/20/17 1220  BP:  (!) 155/64  Pulse: (!) 58 61  Resp: 14 13  Temp:    SpO2: 96% 97%    Last Pain: There were no vitals filed for this visit.       Complications: No apparent anesthesia complications

## 2017-06-20 NOTE — Procedures (Signed)
S/P bilateral common carotid arteriograms followed byendovascular revascularization of occluded Lt MCA  With x 2 passes with solitaire 50mm x 40 mm FR retriever device achieving a TICI 2 b reperfusion

## 2017-06-20 NOTE — Progress Notes (Signed)
eLink Physician-Brief Progress Note Patient Name: Melinda Noble DOB: 11-30-1929 MRN: [id number]   Date of Service  06/20/2017  HPI/Events of Note  ABG reviewed demonstrating adequate ventilation with mild acidosis. Good oxygenation as well. Portable chest x-ray reviewed demonstrating appropriate positioning of endotracheal tube.   eICU Interventions  Continuing ventilator support.      Intervention Category Major Interventions: Respiratory failure - evaluation and management  Tera Partridge 06/20/2017, 4:15 PM

## 2017-06-20 NOTE — Code Documentation (Signed)
83 y.o. Female with PMHx of afib, not on anti-coagulants, CAD, HLD and HTN who was stated to be in her normal health this morning when she woke up. Per her husband, he heard her fall in the bathroom abd went to check on her. He found her on the floor with a large hematoma on her right cheek, right sided weakness, right sided facial droop and slurred speech. EMS was notified and called a code stroke. EMS reporting that patients symptoms have been gradualy improving en route.  On arrival to [facility], pt was met by the stroke team, labs were drawn and pt taken to CT. CT revealed no acute intracranial abnormality. ASPECTS 10. Per EMR, patient has a contrast allergy that states an anaphylactic reaction. NIHSS 8. VAN positive. See EMR for NIHSS and code stroke times. Full dose IV tPA given in CT. Delay in tPA administration d/t automatic BP malfunction. Machine switched and manual BP taken prior to tPA. BP within tPA parameters.  Decision to obtain STAT MRI made since CTA is contraindicated d/t allergy. Patient escorted to MRI by stroke team.      MRI revealed a high-grade stenosis or occlusion of the distal left M1 segment with nonvisualization of distal MCA branch vessels. Case discussed with IR MD. Patient is an IR candidate. Upon transferring the patient from the MRI table back to the ED stretcher, the patient has began to complain of dyspnea. Nasal cannula given. Pt reassessed. Pt now with NIHSS of 10, dyspnea and diaphoretic. BP elevated. Cardene gtt started. Pt continues to have elevated pressures and has a declining mental status. Order for repeat CT prior to IR. CT negative. Aspects 10. Patient taken to IR. IR bedside handoff with IR RN Estill Bamberg.

## 2017-06-20 NOTE — Progress Notes (Signed)
Patient ID: Melinda Noble, female   DOB: 05-11-1930, 83 y.o.   MRN: [id number] INR. 84 yr old Rt handed female.with sudden onset of aphasia ,and Rt sided weakness at approx 6 30am..  premorbid mRSS 0.. IV tpa given. CT brain  NO ICH ASPECTS 10.MRI Near complete. oocclusion LT MCA M1 seg.Mild DWI  Changes  In LT MCA distribution. Sudden neuro deterioration with BP in the 527P systolic. Repeat CT No ICH.Marland Kitchen Option of endovascular revascularization discussed with husband and son . Procedure ,risks of ICH 10 % ,worsening neuro condition,vent dependency,death,inabillity to revascularize all reviewed.Questions answered. Informed witnessed consent obtained for endovascular revascularization under [place]. S.Emberley Kral MD

## 2017-06-20 NOTE — ED Provider Notes (Signed)
[facility] DEPT Provider Note   CSN: [id number] Arrival date & time: 06/20/17  0815     History   Chief Complaint No chief complaint on file.   HPI Melinda Noble is a 83 y.o. female.  HPI  83 year old female with extensive past medical history including hypertension, hyperlipidemia, ischemic cardiomyopathy, coronary disease who presents with strokelike symptoms.history at this time is moderately limited due to expressive aphasia. Per report, patient's last normal was 710 this morning. Patient experienced acute onset of right-sided weakness and subsequent fall. On EMS arrival, the patient was on the ground, difficult to move due to complete right hemiparesis. En route, the patient is had mild improvement and right-sided weakness but has had persistent aphasia.  Level 5 caveat invoked as remainder of history, ROS, and physical exam limited due to patient's aphasia, acuity.   Past Medical History:  Diagnosis Date  . Anemia   . CAD (coronary artery disease)    a. STEMI 10/09/13 - 100% LAD - following initial angioplasty, ? Spontaneous dissection vs related to ballon. s/p complex PCI with 2 overlapping DES to LAD. Adm complicated by VDRF, cardiogenic shock, peri-MI PAF with NSR on amiodarone. Residual mod RCA disease.   Marland Kitchen CAD S/P percutaneous coronary angioplasty 10/09/13   DES x 2 to Mid LAD, 2.5 x 38, 2.5 x 12 Promus (covering extensive disection), moderate RCA disesase  . Cardiogenic shock ([place])    a. 09/2013 - during admission for STEMI.  . Depression   . Dyslipidemia   . H/O Acute respiratory failure due to hypoxia 10/10/2013   a. 09/2013 - secondary to anterior STEMI related pulmonary edema (resolved).  . Hypertension   . Hypothyroidism   . Ischemic cardiomyopathy    a. EF 30-35% during 09/2013 adm for STEMI. b. Repeat Echo EF 40-45% with distal LAD hypokinesis (10/16/14 following STEMI recovery).  . Migraine headache    a. Remotely.  . Orthostatic hypotension    a. Adm  10/2013 for this, felt due to Lasix. Not on regular diuretic due to this/dehydration.  Marland Kitchen PAF (paroxysmal atrial fibrillation) ([place])    a. 09/2013 - peri-MI, NSR at discharge on amiodarone.  . Sinus bradycardia     Patient Active Problem List   Diagnosis Date Noted  . Hypothyroidism 06/03/2016  . Polypharmacy 06/03/2016  . H/O amiodarone therapy 02/08/2014  . Chronic systolic heart failure ([place]) 02/08/2014  . Left shoulder pain 01/15/2014  . Chest pain 11/25/2013  . Obesity (BMI 30.0-34.9) 11/06/2013  . H/o Orthostatic hypotension, not on diuretic due to this 10/24/2013  . Generalized anxiety disorder 10/18/2013  . HTN (hypertension) 10/16/2013  . Cardiomyopathy, ischemic- 30-35% on admission 10/09/13 --> increased to 40-45% prior to discharge. 10/16/2013  . Dyslipidemia 10/16/2013  . PAF- peri-MI. NSR on Amio 10/16/2013  . Acute pulmonary edema with congestive heart failure - due to Anterior STEMI- resolved 10/11/2013  . Cardiogenic shock- resolved 10/10/2013  . Presence of drug coated stent in LAD coronary artery - 2 overlapping Promx DES 2.5 x 38, 2.5 x 12 (postdilated to ~3 mm) 10/10/2013  . Metabolic acidosis- resolved 10/10/2013  . Acute respiratory failure due to hypoxia from acute pulmonary edema- resolved 10/10/2013  . CAD S/P percutaneous coronary angioplasty -- PCI to LAD in setting Ant STEMI; 2 overlapping Promx DES 2.5 x 38, 2.5 x 12 (postdilated to ~3 mm) 10/09/2013    Past Surgical History:  Procedure Laterality Date  . CORONARY ANGIOPLASTY WITH STENT PLACEMENT  10/09/13   2 overlapping Promus  DES to mid LAD, occlusion/dissection (2.5 x 38, 2.5 x 12)  . LEFT HEART CATHETERIZATION WITH CORONARY ANGIOGRAM N/A 10/09/2013   Procedure: LEFT HEART CATHETERIZATION WITH CORONARY ANGIOGRAM;  Surgeon: Leonie Man, MD;  Location: [facility] CATH LAB;  Service: Cardiovascular;  Laterality: N/A;  . SHOULDER SURGERY      OB History    No data available       Home Medications      Prior to Admission medications   Medication Sig Start Date End Date Taking? Authorizing Provider  ALPRAZolam Duanne Moron) 0.25 MG tablet TAKE (1) TABLET TWICE DAILY AS NEEDED FOR ANXIETY. 04/19/17   Reed, Tiffany L, DO  aspirin 81 MG chewable tablet Chew 1 tablet (81 mg total) by mouth daily. 10/17/13   Consuelo Pandy, PA-C  Biotin 1000 MCG tablet Take 1,000 mcg by mouth daily.    [provider]  buPROPion (WELLBUTRIN SR) 150 MG 12 hr tablet Take 150 mg by mouth daily.  08/22/16   [provider]  calcium-vitamin D (OSCAL WITH D) 500-200 MG-UNIT per tablet Take 1 tablet by mouth daily.     [provider]  carvedilol (COREG) 6.25 MG tablet Take 1 tablet (6.25 mg total) by mouth 2 (two) times daily with a meal. 04/09/14   Leonie Man, MD  cholecalciferol (VITAMIN D) 1000 UNITS tablet Take 1,000 Units by mouth daily.    [provider]  clobetasol ointment (TEMOVATE) 4.13 % Apply 1 application topically 2 (two) times daily as needed. (affected areas)--also thighs 07/29/16   Reed, Tiffany L, DO  furosemide (LASIX) 20 MG tablet TAKE 1/2 TABLET DAILY. 05/03/17   Reed, Tiffany L, DO  losartan (COZAAR) 100 MG tablet Take 1 tablet (100 mg total) by mouth daily. 12/30/16 03/30/17  Belva Crome, MD  omeprazole (PRILOSEC) 20 MG capsule Take 20 mg by mouth daily.     [provider]  rosuvastatin (CRESTOR) 5 MG tablet Take 10 mg by mouth 3 (three) times a week. (Mondays, Wednesdays, and Fridays)    [provider]  sertraline (ZOLOFT) 100 MG tablet Take 100 mg by mouth daily.  07/06/16   [provider]  SYNTHROID 100 MCG tablet TAKE 1 TABLET IN THE MORNING ON AN EMPTY STOMACH. 05/17/17   Reed, Tiffany L, DO  vitamin E (VITAMIN E) 400 UNIT capsule Take 400 Units by mouth daily.    [provider]    Family History Family History  Problem Relation Age of Onset  . Varicose Veins Father   . Heart attack Father   . Heart failure  Mother   . Anemia Mother     Social History Social History  Substance Use Topics  . Smoking status: Former Research scientist (life sciences)  . Smokeless tobacco: Never Used  . Alcohol use No     Allergies   Ace inhibitors; Beta adrenergic blockers; Iohexol; Atorvastatin; Latex; Penicillins; and Sulfa antibiotics   Review of Systems Review of Systems  Constitutional: Negative for fever.  HENT: Positive for facial swelling.   Neurological: Positive for speech difficulty, weakness and headaches.  All other systems reviewed and are negative.    Physical Exam Updated Vital Signs BP (!) 164/100 (BP Location: Left Arm)   Wt 96.2 kg (212 lb 1.3 oz) Comment: Per ED weighing bed prior to CT  BMI 34.23 kg/m   Physical Exam  Constitutional: She appears well-developed and well-nourished. No distress.  HENT:  Head: Normocephalic and atraumatic.  Eyes: Conjunctivae are normal.  Neck:  Neck supple.  Cardiovascular: Normal rate, regular rhythm and normal heart sounds.   No murmur heard. Pulmonary/Chest: Effort normal and breath sounds normal. No respiratory distress. She has no wheezes.  Abdominal: She exhibits no distension.  Musculoskeletal: She exhibits no edema.  Neurological: She is alert. She exhibits normal muscle tone.  Moderate expressive aphasia, but follows commands. Right-sided weakness of UE and LE, with moderate R pronator drift.   Skin: Skin is warm. Capillary refill takes less than 2 seconds. No rash noted.  Nursing note and vitals reviewed.    ED Treatments / Results  Labs (all labs ordered are listed, but only abnormal results are displayed) Labs Reviewed  COMPREHENSIVE METABOLIC PANEL - Abnormal; Notable for the following:       Result Value   Glucose, Bld 131 (*)    All other components within normal limits  I-STAT CHEM 8, ED - Abnormal; Notable for the following:    Glucose, Bld 131 (*)    All other components within normal limits  CBG MONITORING, ED - Abnormal; Notable for the  following:    Glucose-Capillary 127 (*)    All other components within normal limits  PROTIME-INR  APTT  CBC  DIFFERENTIAL  ETHANOL  RAPID URINE DRUG SCREEN, HOSP PERFORMED  URINALYSIS, ROUTINE W REFLEX MICROSCOPIC  I-STAT TROPONIN, ED    EKG  EKG Interpretation None       Radiology Ct Head Code Stroke Wo Contrast  Result Date: 06/20/2017 CLINICAL DATA:  Code stroke. Acute onset of right-sided weakness and right facial droop with slurred speech. Fall. Right periorbital hematoma. EXAM: CT HEAD WITHOUT CONTRAST TECHNIQUE: Contiguous axial images were obtained from the base of the skull through the vertex without intravenous contrast. COMPARISON:  CT head without contrast 01/27/2013 FINDINGS: Brain: Mild atrophy and white matter changes are present bilaterally. The basal ganglia are intact. Insular ribbon is normal bilaterally. No acute or focal cortical abnormality is present. Vascular: Atherosclerotic calcifications are present within the cavernous internal carotid artery is bilaterally. There is no hyperdense vessel. Skull: The calvarium is intact. No focal lytic or blastic lesions are present. Sinuses/Orbits: The paranasal sinuses and mastoid air cells are clear. The patient is status post bilateral lens replacements. May prominent right infraorbital hematoma is present without an underlying fracture. Zygomatic arch and orbital rim are intact. ASPECTS [facility] Stroke Program Early CT Score) - Ganglionic level infarction (caudate, lentiform nuclei, internal capsule, insula, M1-M3 cortex): 7/7 - Supraganglionic infarction (M4-M6 cortex): 3/3 Total score (0-10 with 10 being normal): 10/10 IMPRESSION: 1. Stable atrophy and white matter disease. 2. No acute intracranial abnormality or significant interval change. 3. Prominent right infraorbital and lateral soft tissue hematoma without underlying fracture or intraorbital extension. 4. ASPECTS is 10/10 These results were called by telephone at the  time of interpretation on 06/20/2017 at 8:28 am to Dr. Leonel Ramsay, who verbally acknowledged these results. Electronically Signed   By: San Morelle M.D.   On: 06/20/2017 08:35   Ct Maxillofacial Wo Contrast  Result Date: 06/20/2017 CLINICAL DATA:  Right periorbital hematoma after falling and hitting the right side of her face. EXAM: CT MAXILLOFACIAL WITHOUT CONTRAST TECHNIQUE: Multidetector CT imaging of the maxillofacial structures was performed. Multiplanar CT image reconstructions were also generated. COMPARISON:  Head CT obtained earlier today. FINDINGS: Osseous: No fractures. Orbits: Status post bilateral cataract extraction. Sinuses: Normally pneumatized. Soft tissues: Right infraorbital and periorbital hematoma. Limited intracranial: Per the head CT report earlier today. IMPRESSION: 1. No maxillofacial fracture. 2. Right  infraorbital and periorbital soft tissue hematoma. Electronically Signed   By: Claudie Revering M.D.   On: 06/20/2017 09:02    Procedures .Critical Care Performed by: Duffy Bruce Authorized by: Duffy Bruce   Critical care provider statement:    Critical care time (minutes):  35   Critical care time was exclusive of:  Separately billable procedures and treating other patients and teaching time   Critical care was necessary to treat or prevent imminent or life-threatening deterioration of the following conditions:  CNS failure or compromise   Critical care was time spent personally by me on the following activities:  Development of treatment plan with patient or surrogate, discussions with consultants, evaluation of patient's response to treatment, examination of patient, obtaining history from patient or surrogate, ordering and performing treatments and interventions, ordering and review of laboratory studies, ordering and review of radiographic studies, pulse oximetry, re-evaluation of patient's condition and review of old charts   I assumed direction of critical  care for this patient from another provider in my specialty: no      (including critical care time)  Medications Ordered in ED Medications  methylPREDNISolone sodium succinate (SOLU-MEDROL) 125 mg/2 mL injection (not administered)  alteplase (ACTIVASE) 1 mg/mL infusion 87 mg (87 mg Intravenous New Bag/Given 06/20/17 0841)  diphenhydrAMINE (BENADRYL) injection 50 mg (not administered)  methylPREDNISolone sodium succinate (SOLU-MEDROL) 125 mg/2 mL injection 125 mg (125 mg Intravenous Given 06/20/17 1448)     Initial Impression / Assessment and Plan / ED Course  I have reviewed the triage vital signs and the nursing notes.  Pertinent labs & imaging results that were available during my care of the patient were reviewed by me and considered in my medical decision making (see chart for details).     83 year old female with past medical history as above. Acute onset right-sided hemiparesis, facial droop, and expressive aphasia. Concern for acute MCA stroke. Patient taken to CT scan which shows no acute abnormalities. She has no apparent facial fractures. No midline cervical spine tenderness. Based on discussion with neurology, patient will be given TPA and taken for emergent MRI. Patient is in agreement with this plan. She has an apparent mild expressive aphasia but demonstrates good comprehension. PIV x 2. Protecting airway.  MR angiogram shows likely M1 arterial occlusion. Patient taken to interventional radiology. IV tPA given.  Final Clinical Impressions(s) / ED Diagnoses   Final diagnoses:  Acute ischemic stroke [facility])      Duffy Bruce, MD 06/21/17 1459

## 2017-06-21 ENCOUNTER — Other Ambulatory Visit (HOSPITAL_COMMUNITY): Payer: Medicare Other

## 2017-06-21 ENCOUNTER — Inpatient Hospital Stay (HOSPITAL_COMMUNITY): Payer: Medicare Other

## 2017-06-21 ENCOUNTER — Encounter (HOSPITAL_COMMUNITY): Payer: Self-pay | Admitting: Interventional Radiology

## 2017-06-21 DIAGNOSIS — I638 Other cerebral infarction: Secondary | ICD-10-CM

## 2017-06-21 LAB — CBC WITH DIFFERENTIAL/PLATELET
Basophils Absolute: 0 10*3/uL (ref 0.0–0.1)
Basophils Relative: 0 %
EOS ABS: 0 10*3/uL (ref 0.0–0.7)
Eosinophils Relative: 0 %
HCT: 37.4 % (ref 36.0–46.0)
HEMOGLOBIN: 11.9 g/dL — AB (ref 12.0–15.0)
Lymphocytes Relative: 10 %
Lymphs Abs: 1.4 10*3/uL (ref 0.7–4.0)
MCH: 30.1 pg (ref 26.0–34.0)
MCHC: 31.8 g/dL (ref 30.0–36.0)
MCV: 94.7 fL (ref 78.0–100.0)
Monocytes Absolute: 1.4 10*3/uL — ABNORMAL HIGH (ref 0.1–1.0)
Monocytes Relative: 10 %
NEUTROS PCT: 80 %
Neutro Abs: 11.1 10*3/uL — ABNORMAL HIGH (ref 1.7–7.7)
PLATELETS: 174 10*3/uL (ref 150–400)
RBC: 3.95 MIL/uL (ref 3.87–5.11)
RDW: 14.2 % (ref 11.5–15.5)
WBC: 13.8 10*3/uL — ABNORMAL HIGH (ref 4.0–10.5)

## 2017-06-21 LAB — RENAL FUNCTION PANEL
ANION GAP: 7 (ref 5–15)
Albumin: 3 g/dL — ABNORMAL LOW (ref 3.5–5.0)
BUN: 15 mg/dL (ref 6–20)
CALCIUM: 8.9 mg/dL (ref 8.9–10.3)
CO2: 20 mmol/L — ABNORMAL LOW (ref 22–32)
Chloride: 113 mmol/L — ABNORMAL HIGH (ref 101–111)
Creatinine, Ser: 0.75 mg/dL (ref 0.44–1.00)
GFR calc Af Amer: >60 mL/min (ref >60)
Glucose, Bld: 115 mg/dL — ABNORMAL HIGH (ref 65–99)
PHOSPHORUS: 2.8 mg/dL (ref 2.5–4.6)
POTASSIUM: 3.9 mmol/L (ref 3.5–5.1)
SODIUM: 140 mmol/L (ref 135–145)

## 2017-06-21 LAB — HEMOGLOBIN A1C
Hgb A1c MFr Bld: 5.4 % (ref 4.8–5.6)
MEAN PLASMA GLUCOSE: 108.28 mg/dL

## 2017-06-21 LAB — LIPID PANEL
CHOL/HDL RATIO: 3 ratio
CHOLESTEROL: 133 mg/dL (ref 0–200)
HDL: 45 mg/dL (ref >40)
LDL Cholesterol: 64 mg/dL (ref 0–99)
Triglycerides: 121 mg/dL (ref <150)
VLDL: 24 mg/dL (ref 0–40)

## 2017-06-21 LAB — GLUCOSE, CAPILLARY
GLUCOSE-CAPILLARY: 95 mg/dL (ref 65–99)
Glucose-Capillary: 104 mg/dL — ABNORMAL HIGH (ref 65–99)
Glucose-Capillary: 78 mg/dL (ref 65–99)

## 2017-06-21 LAB — MAGNESIUM: MAGNESIUM: 1.7 mg/dL (ref 1.7–2.4)

## 2017-06-21 MED ORDER — LORAZEPAM 2 MG/ML IJ SOLN
INTRAMUSCULAR | Status: AC
  Filled 2017-06-21: qty 1

## 2017-06-21 MED ORDER — CHLORHEXIDINE GLUCONATE 0.12 % MT SOLN
OROMUCOSAL | Status: AC
  Administered 2017-06-21: 10:00:00
  Filled 2017-06-21: qty 15

## 2017-06-21 MED ORDER — LORAZEPAM 2 MG/ML IJ SOLN
1.0000 mg | Freq: Once | INTRAMUSCULAR | Status: AC
  Administered 2017-06-21: 1 mg via INTRAVENOUS

## 2017-06-21 MED ORDER — ASPIRIN 300 MG RE SUPP
300.0000 mg | Freq: Once | RECTAL | Status: AC
  Administered 2017-06-21: 300 mg via RECTAL
  Filled 2017-06-21: qty 1

## 2017-06-21 NOTE — Progress Notes (Signed)
PT Cancellation Note  Patient Details Name: Melinda Noble MRN: [id number] DOB: 1930-09-20   Cancelled Treatment:    Reason Eval/Treat Not Completed: Medical issues which prohibited therapy; patient remains on bedrest and intubated.  Will continue to watch for medical readiness.   Reginia Naas 06/21/2017, 8:57 AM  Magda Kiel, [place] 06/21/2017

## 2017-06-21 NOTE — Progress Notes (Signed)
STROKE TEAM PROGRESS NOTE   HISTORY OF PRESENT ILLNESS (per record)  CC: Right sided weakness  History is obtained from:patient  HPI: Melinda Noble is a 83 y.o. female with a history of afib, not on anti-coagulants who presents with right sided weakness. She was initially plegic on the right, but by the time of arrival to the ED, was having significant improvement.  Her husband states that he spoke with her while he was getting out of bed and she was normal, she then was up getting ready when he heard her fall in the bathroom, and found her on the floor with a large R periorbital hematoma, right-sided weakness, and slurred speech.  Initial NIHSS 8.  Administered tPA bolus at 0941 on 06/20/2017. Stat MRI with high-grade stenosis or occlusion of the distal left M1 segment with nonvisualization of distal MCA branch vessels, became dyspneic, and was taken emergently to IR and intubated.  Per IR, "[w]ith x 2 passes with solitaire 28mm x 40 mm FR retriever device achieved a TICI 2 b reperfusion".      LKW: 6:30 am on 06/21/2017 tpa given?: yes Modified Rankin Score: 0 VAN: positive   Patient was administered IV t-PA at 0941 ib 06/20/2017. She was admitted to the neuro ICU for further evaluation and treatment.   SUBJECTIVE (INTERVAL HISTORY) Her nurse is at the bedside.  The patient is intubated and on light sedation.  IR sheath removed around noon.  Protonix infusion for GI bleed.  Afib without anticoagulation.   OBJECTIVE Temp:  [97.7 F (36.5 C)-98.9 F (37.2 C)] 98.2 F (36.8 C) (09/03 1600) Pulse Rate:  [51-123] 64 (09/03 1712) Resp:  [5-21] 19 (09/03 1712) BP: (78-170)/(39-108) 122/75 (09/03 1712) SpO2:  [88 %-100 %] 100 % (09/03 1712) FiO2 (%):  [40 %-80 %] 40 % (09/03 1539)  CBC:   Recent Labs Lab 06/20/17 0819 06/20/17 0827 06/21/17 0349  WBC 6.7  --  13.8*  NEUTROABS 4.3  --  11.1*  HGB 13.3 13.9 11.9*  HCT 41.6 41.0 37.4  MCV 94.5  --  94.7  PLT 157  --  174    Basic  Metabolic Panel:   Recent Labs Lab 06/20/17 0819 06/20/17 0827 06/21/17 0349  NA 138 142 140  K 4.1 4.1 3.9  CL 108 107 113*  CO2 22  --  20*  GLUCOSE 131* 131* 115*  BUN 16 20 15   CREATININE 0.81 0.80 0.75  CALCIUM 10.0  --  8.9  MG  --   --  1.7  PHOS  --   --  2.8    Lipid Panel:     Component Value Date/Time   CHOL 133 06/21/2017 0349   CHOL 152 01/26/2017 0855   TRIG 121 06/21/2017 0349   HDL 45 06/21/2017 0349   HDL 56 01/26/2017 0855   CHOLHDL 3.0 06/21/2017 0349   VLDL 24 06/21/2017 0349   LDLCALC 64 06/21/2017 0349   LDLCALC 78 01/26/2017 0855   HgbA1c:  Lab Results  Component Value Date   HGBA1C 5.4 06/21/2017   Urine Drug Screen:     Component Value Date/Time   LABOPIA NONE DETECTED 06/20/2017 2201   COCAINSCRNUR NONE DETECTED 06/20/2017 2201   LABBENZ POSITIVE (A) 06/20/2017 2201   AMPHETMU NONE DETECTED 06/20/2017 2201   THCU NONE DETECTED 06/20/2017 2201   LABBARB NONE DETECTED 06/20/2017 2201    Alcohol Level No results found for: [place] Head Code Stroke Wo Contrast 06/20/2017  IMPRESSION: 1. No significant interval change. 2. Stable atrophy and white matter disease. 3. No definite hyperdense vessel. 4. Stable right periorbital hematoma. 5. ASPECTS is 10/10 These results were called by telephone at the time of interpretation on 06/20/2017 at 10:05 am to Dr. Roland Rack , who verbally acknowledged these results.  Ct Head Code Stroke Wo Contrast 06/20/2017 IMPRESSION: 1. Stable atrophy and white matter disease. 2. No acute intracranial abnormality or significant interval change. 3. Prominent right infraorbital and lateral soft tissue hematoma without underlying fracture or intraorbital extension. 4. ASPECTS is 10/10  Ct Head Wo Contrast 06/20/2017 IMPRESSION: 1. Focal density posteriorly in the left sylvian fissure may reflect contrast associated with a more distal occlusion. 2. No acute hemorrhage. 3. No evidence for progressing  infarct. 4. Stable atrophy and white matter disease. 5. Increasing size of right infraorbital hematoma.   Ct Head Wo Contrast 06/21/2017 IMPRESSION: 1. New left posterolateral temporal lobe acute infarction. 2. Stable infarction centered in left frontal centrum semiovale extending into left superior lentiform nucleus and insula in comparison with prior CT/MRI head. Interval increase in edema and local mass effect associated with the infarct. 3. No acute intracranial hemorrhage. These results will be called to the ordering clinician or representative by the Radiologist Assistant, and communication documented in the PACS or zVision Dashboard.   Ct Maxillofacial Wo Contrast 06/20/2017 IMPRESSION: 1. No maxillofacial fracture. 2. Right infraorbital and periorbital soft tissue hematoma.   [name] Head Wo Contrast 06/20/2017 IMPRESSION: 1. High-grade stenosis or occlusion of the distal left M1 segment with nonvisualization of distal MCA branch vessels. This corresponds with the patient's acute symptoms, likely representing an emergent large vessel occlusion. 2. Narrowing of the right M1 segment and attenuation of MCA branch vessels likely related to atherosclerotic disease. 3. Posterior circulation is intact. 4. Ill-defined area of restricted diffusion within white matter of the left centrum semi ovale may reflect acute ischemia. No definite cortical infarct is present.  Mr Brain Wo Contrast 06/21/2017 IMPRESSION: 1. Increased size of acute/subacute left MCA territory infarct, now involving the left insular cortex, left frontal operculum, posterior left temporal and parietal lobe, and more anterior superior left frontal lobe. 2. No evidence for hemorrhagic conversion of left-sided infarcts. 3. Focal acute nonhemorrhagic infarct in the right parietal lobe and centrum semiovale. Bilateral disease raises concern for a central cardio embolic source. 4. Right periorbital hematoma.   Dg Chest Port 1  View 06/20/2017 IMPRESSION: Endotracheal tube in good position Cardiomegaly and mild central venous congestion.  Dg Chest Port 1 View 06/20/2017 IMPRESSION: 1. Stable cardiomegaly.  Aortic atherosclerosis with mild uncoiling. 2. Slightly low-lying endotracheal tube at 2.6 cm above the carina. Slight pullback of the of the ETT is recommended by 1 cm. 3. No acute pulmonary disease.   TTE 06/20/2017  pending     PHYSICAL EXAM Elderly Caucasian lady who is intubated. . Afebrile. Head is nontraumatic. Neck is supple without bruit.    Cardiac exam no murmur or gallop. Lungs are clear to auscultation. Distal pulses are well felt. Neurological Exam :  Intubated.drowsy but opens eyes. Aphasic and will not follow any Left gaze preference. Blinks to threat on the right Pupils irregular reactive. Fundi not visualized. Right lower facial weakness. Tongue midline. Motor system exam shows right hemiparesis with trace withdrawal right upper extremity and modest withdrawal in the right lower extremityr Purposeful antigravity spontaneous movements of left side. DTRs depressed on right and normal on left side. Rt Plantar upgoing and left downgoing.  ASSESSMENT/PLAN Melinda Noble is a 83 y.o. female with history of history of afib, not on anti-coagulants who presents with right sided weakness.  She is status post IV tPA and IR mechanical thrombectomy.  Stroke: acute/subacute L MCA infarct, involving L insular cortex, L frontal operculum, L posterior temporal and parietal lobe, and more anterior L superior frontal lobe, as well as acute infarct R parietal lobe and Rcentrum semiovale, embolic, in the setting of atheorsclerosis.  Resultant  Aphasia and right hemiplegia  CT head: no acute stroke.  Prominent right infraorbital and lateral soft tissue hematoma without underlying fracture or intraorbital extension  MRI head: acute/subacute L MCA infarct, involving the left insular cortex, left frontal operculum,  posterior left temporal and parietal lobe, and more anterior superior left frontal lobe.  Focal acute nonhemorrhagic infarct in the right parietal lobe and centrum semiovale.  MRA head: High-grade L M1 stenosis.  Ill-defined area of restricted diffusion within white matter of the left centrum semi ovale may reflect acute ischemia  2D Echo  pending  LDL 64  HgbA1c 5.4  SCDs for VTE prophylaxis Diet NPO time specified  aspirin 81 mg daily prior to admission, now on aspirin 300 mg suppository daily  Patient counseled to be compliant with her antithrombotic medications  Ongoing aggressive stroke risk factor management  Therapy recommendations:   pending  Disposition:  pending  Hypertension  Unstable  Nicardipine gtt yesterday.  Now off.  Permissive hypertension (OK if < 220/120) but gradually normalize in 5-7 days  Long-term BP goal normotensive  Hyperlipidemia  Home meds: Rosuvastatin 5 mg daily, resumed in hospital  LDL 64, goal < 70  Continue statin at discharge  Other Stroke Risk Factors  Advanced age  Obesity, Body mass index is 32.25 kg/m., recommend weight loss, diet and exercise as appropriate   Coronary artery disease  Other Active Problems  None  Hospital day # 1  I have personally examined this patient, reviewed notes, independently viewed imaging studies, participated in medical decision making and plan of care.ROS completed by me personally and pertinent positives fully documented  I have made any additions or clarifications directly to the above note. She presented with left M1 occlusion and got worse after iv TPA requring emergent mechanical thrombectomy but remains aphasic with hemiplegia. Recommend wean off ventilatory support as tolerated.Continue aspirin and ongoing stroke w/u. No family at bedside. This patient is critically ill and at significant risk of neurological worsening, death and care requires constant monitoring of vital signs,  hemodynamics,respiratory and cardiac monitoring, extensive review of multiple databases, frequent neurological assessment, discussion with family, other specialists and medical decision making of high complexity.I have made any additions or clarifications directly to the above note.This critical care time does not reflect procedure time, or teaching time or supervisory time of PA/NP/Med Resident etc but could involve care discussion time.  I spent 32 minutes of neurocritical care time  in the care of  this patient.      Antony Contras, MD Medical Director [place] Pager: [phone] 06/21/2017 5:55 PM   To contact Stroke Continuity provider, please refer to [email or web address] After hours, contact General Neurology

## 2017-06-21 NOTE — Progress Notes (Signed)
SLP Cancellation Note  Patient Details Name: Melinda Noble MRN: [id number] DOB: 1930/02/27   Cancelled treatment:       Reason Eval/Treat Not Completed: Medical issues which prohibited therapy (Remains intubated. )  Gabriel Rainwater [place], CCC-SLP [phone]  [name] 06/21/2017, 8:22 AM

## 2017-06-21 NOTE — Progress Notes (Signed)
Initial Nutrition Assessment  DOCUMENTATION CODES:   Obesity unspecified  INTERVENTION:   If pt remains intubated recommend Vital High Protein @ 20 ml/hr (480 ml/day) 60 ml Prostat TID Provides: 1080 kcal, 132 grams protein, and 401 ml free water  TF regimen and propofol at current rate providing 1842 total kcal/day   NUTRITION DIAGNOSIS:   Inadequate oral intake related to inability to eat as evidenced by NPO status.  GOAL:   Provide needs based on ASPEN/SCCM guidelines  MONITOR:   I & O's, Vent status  REASON FOR ASSESSMENT:   Ventilator    ASSESSMENT:   Pt with PMH of afib, HTN, ischemic cardiomyopathy, CAD, anemia admitted with L MCA stroke with progression despite tPA, now s/p IR for endovascularization.    Patient is currently intubated on ventilator support No OG tube currently Per RN possible extubation tomorrow   Propofol: 28.9 ml/hr provides 762 kcal per day Medications reviewed and include: NS @ 75 ml/hr Labs reviewed Nutrition-Focused physical exam completed. Findings are no fat depletion, no muscle depletion, and facial edema.     Diet Order:  Diet NPO time specified  Skin:   (skin tear R arm)  Last BM:  unknown  Height:   Ht Readings from Last 1 Encounters:  06/20/17 5\' 8"  (1.727 m)    Weight:   Wt Readings from Last 1 Encounters:  06/20/17 212 lb 1.3 oz (96.2 kg)    Ideal Body Weight:  63.6 kg  BMI:  Body mass index is 32.25 kg/m.  Estimated Nutritional Needs:   Kcal:  6578-4696  Protein:  >/= 127 grams  Fluid:  > 1.5 L/day  EDUCATION NEEDS:   No education needs identified at this time  [place], [place], [place] Pager [phone] After Hours Pager

## 2017-06-21 NOTE — Progress Notes (Signed)
58fr sheath pulled using manual pressure at 1215 hrs from right groin. Hemostasis achieved at 1232 hrs. Right groin site reviewed with pt's RN Marlowe Kays.  No complications.  Noah Delaine Rt-R [employer] Rt-R [employer] Rt-R

## 2017-06-21 NOTE — Progress Notes (Signed)
Upon bedside assessment w/ off-going RN, pt presented as she had throughout the shift. No neuro change had been noted per the off-going RN.   When PM RN was able to begin charting, found 5 point difference in NIH stroke scale assessment. Pt is not presenting any different from her initial assessment w/ off-going RN. A call was made to on-call neurologist to inform of change in NIHSS and why said change from 14 to 19 may have occurred (as pt has had no neuro change).   Will continue to monitor and pt will be taken to scheduled MRI when able.   Guadalupe Maple, RN

## 2017-06-21 NOTE — Progress Notes (Signed)
Pt. was transported to MRI & back to 4N23 without any complications.

## 2017-06-21 NOTE — Progress Notes (Signed)
PULMONARY / CRITICAL CARE MEDICINE   Name: Melinda Noble MRN: [id number] DOB: 1930-10-15    ADMISSION DATE:  06/20/2017 CONSULTATION DATE:  9/2  REFERRING [id number]   CHIEF COMPLAINT:   Vent management post CVA critical care   HISTORY OF PRESENT ILLNESS:   This is a 83 year old female w/ sig h/o atrial fib (not on systemic A/C). Presented to the ER on 9/2 w/ new (R) sided hemiplegia (she was independent w/ ADLs prior to this).  ER course: MRA reviewed-->felt trickle flow to left MCA but no areas of infarct.  -received TPA in the ER-->clinically actually declined.  -repeat CT head-->negative for ICH -went to IR: for cerebral arteriogram and endo vascularization.  -returned to the ICU on vent. PCCM asked to a/w care   PAST MEDICAL HISTORY :  She  has a past medical history of Anemia; CAD (coronary artery disease); CAD S/P percutaneous coronary angioplasty (10/09/13); Cardiogenic shock ([place]); Depression; Dyslipidemia; H/O Acute respiratory failure due to hypoxia (10/10/2013); Hypertension; Hypothyroidism; Ischemic cardiomyopathy; Migraine headache; Orthostatic hypotension; PAF (paroxysmal atrial fibrillation) ([place]); and Sinus bradycardia.  PAST SURGICAL HISTORY: She  has a past surgical history that includes Shoulder surgery; Coronary angioplasty with stent (10/09/13); left heart catheterization with coronary angiogram (N/A, 10/09/2013); and Radiology with anesthesia (N/A, 06/20/2017).  Allergies  Allergen Reactions  . Ace Inhibitors Anaphylaxis  . Beta Adrenergic Blockers Anaphylaxis  . Iohexol Anaphylaxis     Code: HIVES, Desc: anaphylactic shock s/p contrast injection many yrs ago--suggested that pt NEVER have iv contrast//a.c., Onset Date: [id number]   . Atorvastatin Rash  . Latex Rash  . Penicillins Swelling and Rash  . Sulfa Antibiotics Rash    Current Facility-Administered Medications on File Prior to Encounter  Medication  . etomidate (AMIDATE) injection  . rocuronium  (ZEMURON) injection  . succinylcholine (ANECTINE) injection   Current Outpatient Prescriptions on File Prior to Encounter  Medication Sig  . ALPRAZolam (XANAX) 0.25 MG tablet TAKE (1) TABLET TWICE DAILY AS NEEDED FOR ANXIETY.  Marland Kitchen aspirin 81 MG chewable tablet Chew 1 tablet (81 mg total) by mouth daily.  . Biotin 1000 MCG tablet Take 1,000 mcg by mouth daily.  Marland Kitchen buPROPion (WELLBUTRIN SR) 150 MG 12 hr tablet Take 150 mg by mouth daily.   . calcium-vitamin D (OSCAL WITH D) 500-200 MG-UNIT per tablet Take 1 tablet by mouth daily.   . carvedilol (COREG) 6.25 MG tablet Take 1 tablet (6.25 mg total) by mouth 2 (two) times daily with a meal.  . cholecalciferol (VITAMIN D) 1000 UNITS tablet Take 1,000 Units by mouth daily.  . clobetasol ointment (TEMOVATE) 5.78 % Apply 1 application topically 2 (two) times daily as needed. (affected areas)--also thighs  . furosemide (LASIX) 20 MG tablet TAKE 1/2 TABLET DAILY.  Marland Kitchen losartan (COZAAR) 100 MG tablet Take 1 tablet (100 mg total) by mouth daily.  Marland Kitchen omeprazole (PRILOSEC) 20 MG capsule Take 20 mg by mouth daily.   . rosuvastatin (CRESTOR) 5 MG tablet Take 10 mg by mouth 3 (three) times a week. (Mondays, Wednesdays, and Fridays)  . sertraline (ZOLOFT) 100 MG tablet Take 100 mg by mouth daily.   Marland Kitchen SYNTHROID 100 MCG tablet TAKE 1 TABLET IN THE MORNING ON AN EMPTY STOMACH.  . vitamin E (VITAMIN E) 400 UNIT capsule Take 400 Units by mouth daily.    FAMILY HISTORY:  Her indicated that her mother is deceased. She indicated that her father is deceased. She indicated that her maternal grandmother is deceased. She  indicated that her maternal grandfather is deceased. She indicated that her paternal grandmother is deceased. She indicated that her paternal grandfather is deceased.    SOCIAL HISTORY: She  reports that she has quit smoking. She has never used smokeless tobacco. She reports that she does not drink alcohol or use drugs.  REVIEW OF SYSTEMS:   Unable    SUBJECTIVE:  Agitated today. Still on propofol  VITAL SIGNS: BP (!) 135/59   Pulse (!) 52   Temp 98.4 F (36.9 C) (Axillary)   Resp 10   Ht 5\' 8"  (1.727 m)   Wt 212 lb 1.3 oz (96.2 kg) Comment: Per ED weighing bed prior to CT  SpO2 100%   BMI 32.25 kg/m   HEMODYNAMICS:    VENTILATOR SETTINGS: Vent Mode: PRVC FiO2 (%):  [60 %-100 %] 60 % Set Rate:  [14 bmp] 14 bmp Vt Set:  [520 mL-550 mL] 520 mL PEEP:  [5 cmH20] 5 cmH20 Plateau Pressure:  [14 cmH20-19 cmH20] 14 cmH20  INTAKE / OUTPUT: I/O last 3 completed shifts: In: 3700.6 [I.V.:3650.6; IV Piggyback:50] Out: 1175 [Urine:1065; Blood:110]  PHYSICAL EXAMINATION: Gen:      No acute distress HEENT:  EOMI, sclera anicteric, ETT, right peri orbital hematoma Neck:     No masses; no thyromegaly Lungs:    Clear to auscultation bilaterally; normal respiratory effort CV:         Regular rate and rhythm; no murmurs Abd:      + bowel sounds; soft, non-tender; no palpable masses, no distension Ext:    No edema; adequate peripheral perfusion Skin:      Warm and dry; no rash Neuro: Moving extremities. agitated  LABS:  BMET  Recent Labs Lab 06/20/17 0819 06/20/17 0827 06/21/17 0349  NA 138 142 140  K 4.1 4.1 3.9  CL 108 107 113*  CO2 22  --  20*  BUN 16 20 15   CREATININE 0.81 0.80 0.75  GLUCOSE 131* 131* 115*    Electrolytes  Recent Labs Lab 06/20/17 0819 06/21/17 0349  CALCIUM 10.0 8.9  MG  --  1.7  PHOS  --  2.8    CBC  Recent Labs Lab 06/20/17 0819 06/20/17 0827 06/21/17 0349  WBC 6.7  --  13.8*  HGB 13.3 13.9 11.9*  HCT 41.6 41.0 37.4  PLT 157  --  174    Coag's  Recent Labs Lab 06/20/17 0819  APTT 32  INR 0.94    Sepsis Markers No results for input(s): LATICACIDVEN, PROCALCITON, O2SATVEN in the last 168 hours.  ABG  Recent Labs Lab 06/20/17 1522  PHART 7.309*  PCO2ART 44.8  PO2ART 170.0*    Liver Enzymes  Recent Labs Lab 06/20/17 0819 06/21/17 0349  AST 26  --    ALT 23  --   ALKPHOS 72  --   BILITOT 0.6  --   ALBUMIN 3.9 3.0*    Cardiac Enzymes No results for input(s): TROPONINI, PROBNP in the last 168 hours.  Glucose  Recent Labs Lab 06/20/17 0817 06/21/17 0347  GLUCAP 127* 104*    Imaging   STUDIES:  CT head 9/2: 1. Focal density posteriorly in the left sylvian fissure may reflect contrast associated with a more distal occlusion. 2. No acute hemorrhage. 3. No evidence for progressing infarct. 4. Stable atrophy and white matter disease. 5. Increasing size of right infraorbital hematoma. MCA 9/2:  High grade stenosis or occlusion of the distal M1 seg w/ nonvisualization of the distal MCA branch  CXR 9/2: ET tube in good position, mild venous congestion. No infiltrate. I have reviewed the images personally.  CULTURES:   ANTIBIOTICS:   SIGNIFICANT EVENTS:   LINES/TUBES: oett 9/2>>>  DISCUSSION: Acute left MCA stroke w/ progression in spite of TPA. Now s/p IR for endovascularization.   ASSESSMENT / PLAN:  PULMONARY A: Need for mechanical ventilation for altered mental status  abg reviewed PCXR still pending P:   Full vent support. Reduce Fio2 to 40% Weaning trials as tolerated  CARDIOVASCULAR A:  Mild HTN  H/o afib P:  SBP goal per stroke team (120-140) Cont tele monitoring Holding outpatient aspirin, Crestor, Cozaar, Lasix and Coreg  RENAL A:   No acute  P:   Trend chem  Strict I&O  GASTROINTESTINAL A:   Inadequate oral intake due to mental status  P:   Keep nothing by mouth Pepcid for ulcer prophylaxis  HEMATOLOGIC A:   No acute w/out evidence of bleeding  P:  Trend cbc Ac per neuro  Transfuse per protocol   INFECTIOUS A:   No evidence of infection  P:   Observe off antibiotics  ENDOCRINE A:   Hypothyroidism  P:   IV Synthroid  NEUROLOGIC A:   Left MCA stroke w/ right sided hemiplegia. Now s/p TPA and revascularization in IR P:   RASS goal: -2 PAD protocol  Propofol  gtt  FAMILY  - Updates: Daughter updated 9/3 - Inter-disciplinary family meet or Palliative Care meeting due by: 9/9  The patient is critically ill with multiple organ system failure and requires high complexity decision making for assessment and support, frequent evaluation and titration of therapies, advanced monitoring, review of radiographic studies and interpretation of complex data.   Critical Care Time devoted to patient care services, exclusive of separately billable procedures, described in this note is 35 minutes.   Marshell Garfinkel MD [facility] Pager [phone] If no answer or after 3pm call: [phone] 06/21/2017, 7:55 AM

## 2017-06-21 NOTE — Progress Notes (Signed)
RT transported pt to and from CT with event. RT will continue to monitor.

## 2017-06-21 NOTE — Progress Notes (Signed)
OT Cancellation Note  Patient Details Name: Melinda Noble MRN: [id number] DOB: Mar 12, 1930   Cancelled Treatment:    Reason Eval/Treat Not Completed: Patient not medically ready. Pt with strict bedrest orders. Will await increase in activity orders prior to initiating OT evaluation. Thank you for this referral!  Norman Herrlich, MS OTR/L  Pager: [place] 06/21/2017, 7:19 AM

## 2017-06-22 ENCOUNTER — Inpatient Hospital Stay (HOSPITAL_COMMUNITY): Payer: Medicare Other

## 2017-06-22 DIAGNOSIS — I63312 Cerebral infarction due to thrombosis of left middle cerebral artery: Secondary | ICD-10-CM

## 2017-06-22 DIAGNOSIS — G9340 Encephalopathy, unspecified: Secondary | ICD-10-CM

## 2017-06-22 DIAGNOSIS — Z515 Encounter for palliative care: Secondary | ICD-10-CM

## 2017-06-22 LAB — GLUCOSE, CAPILLARY
GLUCOSE-CAPILLARY: 108 mg/dL — AB (ref 65–99)
Glucose-Capillary: 97 mg/dL (ref 65–99)
Glucose-Capillary: 109 mg/dL — ABNORMAL HIGH (ref 65–99)
Glucose-Capillary: 105 mg/dL — ABNORMAL HIGH (ref 65–99)
Glucose-Capillary: 92 mg/dL (ref 65–99)
Glucose-Capillary: 103 mg/dL — ABNORMAL HIGH (ref 65–99)

## 2017-06-22 MED ORDER — LORAZEPAM 2 MG/ML IJ SOLN
0.5000 mg | INTRAMUSCULAR | Status: DC | PRN
  Administered 2017-06-22: 1 mg via INTRAVENOUS
  Filled 2017-06-22: qty 1

## 2017-06-22 MED ORDER — MAGNESIUM SULFATE 2 GM/50ML IV SOLN
2.0000 g | Freq: Once | INTRAVENOUS | Status: AC
  Administered 2017-06-22: 2 g via INTRAVENOUS
  Filled 2017-06-22: qty 50

## 2017-06-22 MED ORDER — LEVOTHYROXINE SODIUM 100 MCG IV SOLR
50.0000 ug | Freq: Every day | INTRAVENOUS | Status: DC
  Administered 2017-06-22 – 2017-07-01 (×10): 50 ug via INTRAVENOUS
  Filled 2017-06-22 (×10): qty 5

## 2017-06-22 MED ORDER — DEXMEDETOMIDINE HCL IN NACL 200 MCG/50ML IV SOLN
0.4000 ug/kg/h | INTRAVENOUS | Status: DC
  Administered 2017-06-22: 0.4 ug/kg/h via INTRAVENOUS
  Administered 2017-06-23: 0.3 ug/kg/h via INTRAVENOUS
  Filled 2017-06-22 (×2): qty 50

## 2017-06-22 MED ORDER — POTASSIUM CHLORIDE 10 MEQ/100ML IV SOLN
10.0000 meq | INTRAVENOUS | Status: AC
  Administered 2017-06-22 (×2): 10 meq via INTRAVENOUS
  Filled 2017-06-22 (×2): qty 100

## 2017-06-22 NOTE — Progress Notes (Signed)
PULMONARY / CRITICAL CARE MEDICINE   Name: Melinda Noble MRN: [id number] DOB: 1930-09-22    ADMISSION DATE:  06/20/2017 CONSULTATION DATE:  9/2  REFERRING [id number]   CHIEF COMPLAINT:   Vent management post CVA critical care   HISTORY OF PRESENT ILLNESS:   This is a 83 year old female w/ sig h/o atrial fib (not on systemic A/C). Presented to the ER on 9/2 w/ new (R) sided hemiplegia (she was independent w/ ADLs prior to this).  ER course: MRA reviewed-->felt trickle flow to left MCA but no areas of infarct.  -received TPA in the ER-->clinically actually declined.  -repeat CT head-->negative for ICH -went to IR: for cerebral arteriogram and endo vascularization.  -returned to the ICU on vent. PCCM asked to a/w care   SUBJECTIVE:  No events overnight, more interactive this AM and weaning.  VITAL SIGNS: BP (!) 158/114   Pulse 67   Temp 98.7 F (37.1 C) (Axillary)   Resp 13   Ht 5\' 8"  (1.727 m)   Wt 96.2 kg (212 lb 1.3 oz) Comment: Per ED weighing bed prior to CT  SpO2 100%   BMI 32.25 kg/m    HEMODYNAMICS:    VENTILATOR SETTINGS: Vent Mode: PSV;CPAP FiO2 (%):  [40 %] 40 % Set Rate:  [14 bmp] 14 bmp Vt Set:  [520 mL] 520 mL PEEP:  [5 cmH20] 5 cmH20 Pressure Support:  [8 cmH20-10 cmH20] 10 cmH20 Plateau Pressure:  [14 cmH20] 14 cmH20  INTAKE / OUTPUT: I/O last 3 completed shifts: In: 3996.2 [I.V.:3946.2; IV Piggyback:50] Out: 2694 [Urine:1790]  PHYSICAL EXAMINATION: Gen:      Acutely ill appearing female, NAD, awake and interactive HEENT:  [place]/AT, PERRL, EOM-I and right sided hematoma Neck:      No masses; no thyromegaly Lungs:    CTA bilaterally CV:         RRR, Nl S1/S2, -M/R/G. Abd:      Soft, NT, ND and +BS Ext:    -edema and -tenderness Skin:       Warm and dry; no rash Neuro:     Moving extremities. agitated  LABS:  BMET  Recent Labs Lab 06/20/17 0819 06/20/17 0827 06/21/17 0349  NA 138 142 140  K 4.1 4.1 3.9  CL 108 107 113*  CO2 22  --   20*  BUN 16 20 15   CREATININE 0.81 0.80 0.75  GLUCOSE 131* 131* 115*   Electrolytes  Recent Labs Lab 06/20/17 0819 06/21/17 0349  CALCIUM 10.0 8.9  MG  --  1.7  PHOS  --  2.8   CBC  Recent Labs Lab 06/20/17 0819 06/20/17 0827 06/21/17 0349  WBC 6.7  --  13.8*  HGB 13.3 13.9 11.9*  HCT 41.6 41.0 37.4  PLT 157  --  174   Coag's  Recent Labs Lab 06/20/17 0819  APTT 32  INR 0.94   Sepsis Markers No results for input(s): LATICACIDVEN, PROCALCITON, O2SATVEN in the last 168 hours.  ABG  Recent Labs Lab 06/20/17 1522  PHART 7.309*  PCO2ART 44.8  PO2ART 170.0*   Liver Enzymes  Recent Labs Lab 06/20/17 0819 06/21/17 0349  AST 26  --   ALT 23  --   ALKPHOS 72  --   BILITOT 0.6  --   ALBUMIN 3.9 3.0*   Cardiac Enzymes No results for input(s): TROPONINI, PROBNP in the last 168 hours.  Glucose  Recent Labs Lab 06/20/17 8546 06/21/17 0347 06/21/17 1951 06/21/17 2317 06/22/17 0319  06/22/17 0828  GLUCAP 127* 104* 78 95 108* 97   Imaging  STUDIES:  CT head 9/2: 1. Focal density posteriorly in the left sylvian fissure may reflect contrast associated with a more distal occlusion. 2. No acute hemorrhage. 3. No evidence for progressing infarct. 4. Stable atrophy and white matter disease. 5. Increasing size of right infraorbital hematoma. MCA 9/2:  High grade stenosis or occlusion of the distal M1 seg w/ nonvisualization of the distal MCA branch   CXR 9/2: ET tube in good position, mild venous congestion. No infiltrate. I have reviewed the images personally.  CULTURES:   ANTIBIOTICS:   SIGNIFICANT EVENTS:   LINES/TUBES: ETT 9/2>>>  DISCUSSION: Acute left MCA stroke w/ progression in spite of TPA. Now s/p IR for endovascularization.   ASSESSMENT / PLAN:  PULMONARY A: Need for mechanical ventilation for altered mental status  abg reviewed PCXR still pending P:   PS trials Extubate today per neuro Awaiting neuro input regarding  retube and trach (family does not wish for a trach)  CARDIOVASCULAR A:  Mild HTN  H/o afib P:  SBP goal per stroke team (120-140) Cont tele monitoring Holding outpatient aspirin, Crestor, Cozaar, Lasix and Coreg  RENAL A:   No acute  P:   Trend chem  Strict I&O  GASTROINTESTINAL A:   Inadequate oral intake due to mental status  P:   NPO SLP Pepcid for ulcer prophylaxis  HEMATOLOGIC A:   No acute w/out evidence of bleeding  P:  Trend cbc A/C per neuro  Transfuse per ICU protocol   INFECTIOUS A:   No evidence of infection  P:   Observe off antibiotics  ENDOCRINE A:   Hypothyroidism  P:   IV Synthroid  NEUROLOGIC A:   Left MCA stroke w/ right sided hemiplegia. Now s/p TPA and revascularization in IR P:   RASS goal: -2 D/C propofol PRN fentanyl for pain  FAMILY  - Updates: Husband and daughter updated bedside.  Will need input from neurology prior to decision on extubation and code status. - Inter-disciplinary family meet or Palliative Care meeting due by: 9/9  The patient is critically ill with multiple organ systems failure and requires high complexity decision making for assessment and support, frequent evaluation and titration of therapies, application of advanced monitoring technologies and extensive interpretation of multiple databases.   Critical Care Time devoted to patient care services described in this note is  35  Minutes. This time reflects time of care of this signee Dr Jennet Maduro. This critical care time does not reflect procedure time, or teaching time or supervisory time of PA/NP/Med student/Med Resident etc but could involve care discussion time.  Rush Farmer, M.D. [facility]Critical [facility] Medicine. Pager: [phone]. After hours pager: [phone].  06/22/2017, 9:14 AM

## 2017-06-22 NOTE — Evaluation (Signed)
Clinical/Bedside Swallow Evaluation Patient Details  Name: Melinda Noble MRN: [id number] Date of Birth: October 17, 1930  Today's Date: 06/22/2017 Time: SLP Start Time (ACUTE ONLY): 1413 SLP Stop Time (ACUTE ONLY): 1430 SLP Time Calculation (min) (ACUTE ONLY): 17 min  Past Medical History:  Past Medical History:  Diagnosis Date  . Anemia   . CAD (coronary artery disease)    a. STEMI 10/09/13 - 100% LAD - following initial angioplasty, ? Spontaneous dissection vs related to ballon. s/p complex PCI with 2 overlapping DES to LAD. Adm complicated by VDRF, cardiogenic shock, peri-MI PAF with NSR on amiodarone. Residual mod RCA disease.   Marland Kitchen CAD S/P percutaneous coronary angioplasty 10/09/13   DES x 2 to Mid LAD, 2.5 x 38, 2.5 x 12 Promus (covering extensive disection), moderate RCA disesase  . Cardiogenic shock ([place])    a. 09/2013 - during admission for STEMI.  . Depression   . Dyslipidemia   . H/O Acute respiratory failure due to hypoxia 10/10/2013   a. 09/2013 - secondary to anterior STEMI related pulmonary edema (resolved).  . Hypertension   . Hypothyroidism   . Ischemic cardiomyopathy    a. EF 30-35% during 09/2013 adm for STEMI. b. Repeat Echo EF 40-45% with distal LAD hypokinesis (10/16/14 following STEMI recovery).  . Migraine headache    a. Remotely.  . Orthostatic hypotension    a. Adm 10/2013 for this, felt due to Lasix. Not on regular diuretic due to this/dehydration.  Marland Kitchen PAF (paroxysmal atrial fibrillation) ([place])    a. 09/2013 - peri-MI, NSR at discharge on amiodarone.  . Sinus bradycardia    Past Surgical History:  Past Surgical History:  Procedure Laterality Date  . CORONARY ANGIOPLASTY WITH STENT PLACEMENT  10/09/13   2 overlapping Promus DES to mid LAD, occlusion/dissection (2.5 x 38, 2.5 x 12)  . LEFT HEART CATHETERIZATION WITH CORONARY ANGIOGRAM N/A 10/09/2013   Procedure: LEFT HEART CATHETERIZATION WITH CORONARY ANGIOGRAM;  Surgeon: Leonie Man, MD;  [facility] C[facility]  LAB;  Service: Cardiovascular;  Laterality: N/A;  . RADIOLOGY WITH ANESTHESIA N/A 06/20/2017   Procedure: RADIOLOGY WITH ANESTHESIA;  Surgeon: Luanne Bras, MD;  Location: [facility];  Service: Radiology;  Laterality: N/A;  . SHOULDER SURGERY     HPI:  83 year old female w/ sig h/o atrial fib (not on systemic A/C). Presented to the ER on 9/2 w/ new (R) sided hemiplegia.  Dx  left MCA CVA, S/P endovascularization in IR.  ETT 9/2-9/4.   MRI: acute/subacute L MCA infarct, involving L insular cortex, L frontal operculum, L posterior temporal and parietal lobe, and more anterior L superior frontal lobe, as well as acute infarct R parietal lobe and Rcentrum semiovale, embolic, in the setting of atheorsclerosis.   Assessment / Plan / Recommendation Clinical Impression  Pt presents with dysphagia s/p left CVA and brief intubation - phonation is hoarse, low volume; there are mild focal CN deficits on right (CN VII).  Pt has difficulty following commands due to aphasia.  She accepted ice chips/ tspns water from spoon, eliciting throat clearing intermittently, stronger cough associated with tspns water. For today, recommend continue NPO except occasional ice chips after oral care.  Pt will need instrumental swallow study -will proceed with MBS next date. D/W pt, family, RN.  SLP Visit Diagnosis: Dysphagia, unspecified (R13.10)    Aspiration Risk  Moderate aspiration risk    Diet Recommendation   NPO  Medication Administration: Via alternative means    Other  Recommendations Oral Care  Recommendations: Oral care QID;Oral care prior to ice chip/H20   Follow up Recommendations        Frequency and Duration            Prognosis Prognosis for Safe Diet Advancement: Good      Swallow Study   General Date of Onset: 06/20/17 HPI: 83 year old female w/ sig h/o atrial fib (not on systemic A/C). Presented to the ER on 9/2 w/ new (R) sided hemiplegia.  Dx  left MCA CVA, S/P endovascularization in IR.  ETT  9/2-9/4.   MRI: acute/subacute L MCA infarct, involving L insular cortex, L frontal operculum, L posterior temporal and parietal lobe, and more anterior L superior frontal lobe, as well as acute infarct R parietal lobe and Rcentrum semiovale, embolic, in the setting of atheorsclerosis. Type of Study: Bedside Swallow Evaluation Previous Swallow Assessment: no Diet Prior to this Study: NPO Temperature Spikes Noted: No Respiratory Status: Nasal cannula History of Recent Intubation: Yes Length of Intubations (days): 2 days Date extubated: 06/22/17 Behavior/Cognition: Alert;Cooperative Oral Cavity Assessment: Within Functional Limits Oral Care Completed by SLP: No Oral Cavity - Dentition: Adequate natural dentition Vision: Functional for self-feeding Self-Feeding Abilities: Needs assist Patient Positioning: Upright in chair Baseline Vocal Quality: Hoarse;Low vocal intensity Volitional Cough: Cognitively unable to elicit Volitional Swallow: Unable to elicit    Oral/Motor/Sensory Function Overall Oral Motor/Sensory Function: Mild impairment Facial Symmetry: Abnormal symmetry right Facial Strength: Reduced right Lingual Symmetry: Within Functional Limits   Ice Chips Ice chips: Impaired Presentation: Spoon Oral Phase Functional Implications: Oral holding Pharyngeal Phase Impairments: Throat Clearing - Delayed (x1)   Thin Liquid Thin Liquid: Impaired Presentation: Spoon Pharyngeal  Phase Impairments: Throat Clearing - Delayed ; cough   Nectar Thick Nectar Thick Liquid: Not tested   Honey Thick Honey Thick Liquid: Not tested   Puree Puree: Not tested   Solid   GO   Solid: Not tested        Melinda Noble 06/22/2017,3:12 PM

## 2017-06-22 NOTE — Progress Notes (Signed)
PT Cancellation Note  Patient Details Name: Melinda Noble MRN: [id number] DOB: February 05, 1930   Cancelled Treatment:    Reason Eval/Treat Not Completed: Medical issues which prohibited therapy; patient remains intubated and sedated and on bedrest.  Will attempt to see when ready to mobilize.   Melinda Noble 06/22/2017, 8:34 AM  Magda Kiel, [place] 06/22/2017

## 2017-06-22 NOTE — Progress Notes (Signed)
Occupational Therapy Evaluation Patient Details Name: Melinda Noble MRN: [id number] DOB: 03/24/1930 Today's Date: 06/22/2017    History of Present Illness 83 y.o. female with a history of afib, not on anti-coagulants who presents with right sided weakness.Her husband states that he spoke with her while he was getting out of bed and she was normal, she then was up getting ready when he heard her fall in the bathroom, and found her on the floor with a large R periorbital hematoma, right-sided weakness, and slurred speech.  Initial NIHSS 8.  Administered tPA bolus at 0941 on 06/20/2017. taken emergently to IR and intubated and achieved a reperfusion. Extubated 06/22/17.    Clinical Impression   PTA, pt lived in [place] at [facility] with her husband and was independent with mobility and ADL. Family states she was "unsteady" at baseline. Pt presents with significant functional impairments due to deficits listed below and will benefit from rehab at [facility]. Will follow acutely to address established goals and to maximize functional level of independence. Family present for evaluation. VSS throughout session.     Follow Up Recommendations  SNF;Supervision/Assistance - 24 hour    Equipment Recommendations  Other (comment) (TBA at SNF)    Recommendations for Other Services       Precautions / Restrictions Precautions Precautions: Fall Restrictions Weight Bearing Restrictions: No      Mobility Bed Mobility Overal bed mobility: Needs Assistance             General bed mobility comments: sitting EOB with PT on arrival  Transfers Overall transfer level: Needs assistance Equipment used: 2 person hand held assist Transfers: Sit to/from Stand;Stand Pivot Transfers Sit to Stand: Max assist;+2 physical assistance Stand pivot transfers: Max assist;+2 physical assistance       General transfer comment: Pt stepping to chair from [facility]. Movement improved with repetition    Balance  Overall balance assessment: Needs assistance   Sitting balance-Leahy Scale: Fair       Standing balance-Leahy Scale: Poor                             ADL either performed or assessed with clinical judgement   ADL Overall ADL's : Needs assistance/impaired     Grooming: Moderate assistance;Sitting Grooming Details (indicate cue type and reason): Pt did spontaneously comb hair when presented with comb (using L hand) Upper Body Bathing: Maximal assistance;Sitting   Lower Body Bathing: Maximal assistance;Sit to/from stand   Upper Body Dressing : Maximal assistance;Sitting   Lower Body Dressing: Total assistance;Sit to/from stand   Toilet Transfer: +2 for physical assistance;Maximal assistance;Stand-pivot [employer] forces technical officer Details (indicate cue type and reason): Pt spontaneously reaching for toilet Toileting- Clothing Manipulation and Hygiene: Maximal assistance       Functional mobility during ADLs: Maximal assistance;+2 for physical assistance;Cueing for safety;Cueing for sequencing General ADL Comments: Pt not following commands to complete grooming/self care, but spontaneously using objects appropriately     Vision   Additional Comments: vision impaired. Will further assess. Bruised/swollen R eye. L gaze preference     Perception Perception Comments: appears impaired. Will further assess   Praxis Praxis Praxis tested?: Deficits Deficits: Ideomotor;Perseveration;Organization    Pertinent Vitals/Pain Pain Assessment: Faces Faces Pain Scale: Hurts little more Pain Location: general discomfort Pain Descriptors / Indicators: Discomfort;Grimacing;Guarding Pain Intervention(s): Limited activity within patient's tolerance     Hand Dominance Left   Extremity/Trunk Assessment Upper Extremity Assessment Upper Extremity Assessment: RUE  deficits/detail RUE Deficits / Details: moving RUE out of synergy pattern. Initiating movement with RUE spontaneously. Poor  grip/pinch strength. Unable to maintain graps on cloth. Will further assess RUE Sensation: decreased light touch RUE Coordination: decreased fine motor;decreased gross motor   Lower Extremity Assessment Lower Extremity Assessment: Defer to PT evaluation   Cervical / Trunk Assessment Cervical / Trunk Assessment: Other exceptions   Communication Communication Communication: Receptive difficulties;HOH   Cognition Arousal/Alertness: Lethargic Behavior During Therapy: Flat affect Overall Cognitive Status: Impaired/Different from baseline Area of Impairment: Orientation;Attention;Memory;Following commands;Safety/judgement;Awareness;Problem solving                   Current Attention Level: Sustained     Safety/Judgement: Decreased awareness of safety;Decreased awareness of deficits Awareness: Intellectual Problem Solving: Slow processing;Decreased initiation;Difficulty sequencing;Requires verbal cues;Requires tactile cues     General Comments       Exercises     Shoulder Instructions      Home Living Family/patient expects to be discharged to:: Skilled nursing facility ([facility] apartment w/ spouse) Living Arrangements: Spouse/significant other Available Help at Discharge:  (resident of [facility] [place]) Type of Home: House (independent living at [facility])                              Lives With: Spouse    Prior Functioning/Environment Level of Independence: Independent                 OT Problem List: Decreased strength;Decreased range of motion;Decreased activity tolerance;Impaired balance (sitting and/or standing);Impaired vision/perception;Decreased coordination;Decreased cognition;Decreased safety awareness;Decreased knowledge of use of DME or AE;Decreased knowledge of precautions;Cardiopulmonary status limiting activity;Impaired sensation;Impaired tone;Obesity;Impaired UE functional use;Pain;Increased edema      OT Treatment/Interventions:  Self-care/ADL training;Therapeutic exercise;Neuromuscular education;DME and/or AE instruction;Therapeutic activities;Cognitive remediation/compensation;Visual/perceptual remediation/compensation;Balance training;Patient/family education    OT Goals(Current goals can be found in the care plan section) Acute Rehab OT Goals Patient Stated Goal: per family to get therapy OT Goal Formulation: With patient/family Time For Goal Achievement: 07/06/17 Potential to Achieve Goals: Good  OT Frequency: Min 2X/week   Barriers to D/C:            Co-evaluation              AM-PAC PT "6 Clicks" Daily Activity     Outcome Measure Help from another person eating meals?: A Lot Help from another person taking care of personal grooming?: A Lot Help from another person toileting, which includes using toliet, bedpan, or urinal?: Total Help from another person bathing (including washing, rinsing, drying)?: A Lot Help from another person to put on and taking off regular upper body clothing?: A Lot Help from another person to put on and taking off regular lower body clothing?: Total 6 Click Score: 10   End of Session Equipment Utilized During Treatment: Gait belt Nurse Communication: Mobility status  Activity Tolerance: Patient tolerated treatment well Patient left: in chair;with call bell/phone within reach;with chair alarm set;with family/visitor present  OT Visit Diagnosis: Other abnormalities of gait and mobility (R26.89);Muscle weakness (generalized) (M62.81);Apraxia (R48.2);Other symptoms and signs involving cognitive function;Hemiplegia and hemiparesis Hemiplegia - Right/Left: Right Hemiplegia - dominant/non-dominant: Non-Dominant Hemiplegia - caused by: Cerebral infarction                Time: 1250-1330 OT Time Calculation (min): 40 min Charges:  OT General Charges $OT Visit: 1 Visit OT Evaluation $OT Eval High Complexity: 1 High OT Treatments $Self Care/Home Management :  8-22  mins G-Codes:     [facility], OT/L  [phone] 06/22/2017  Reyes Fifield,HILLARY 06/22/2017, 4:08 PM

## 2017-06-22 NOTE — Evaluation (Signed)
Physical Therapy Evaluation Patient Details Name: Melinda Noble MRN: [id number] DOB: [birth year] Today's Date: 06/22/2017   History of Present Illness  83 y.o. female with a history of afib, not on anti-coagulants who presents with right sided weakness.Her husband states that he spoke with her while he was getting out of bed and she was normal, she then was up getting ready when he heard her fall in the bathroom, and found her on the floor with a large R periorbital hematoma, right-sided weakness, and slurred speech.  Initial NIHSS 8.  Administered tPA bolus at 0941 on 06/20/2017. taken emergently to IR and intubated and achieved a reperfusion. Extubated 06/22/17.   Clinical Impression  Patient presents with decreased independence with mobility due to weakness, decreased cognition, decreased balance, ideomotor apraxia and expressive aphasia limiting independence and safety with mobility.  She will benefit from skilled PT in the acute setting to allow return home following SNF level rehab stay.    Follow Up Recommendations SNF    Equipment Recommendations  Other (comment) (TBA)    Recommendations for Other Services       Precautions / Restrictions Precautions Precautions: Fall Restrictions Weight Bearing Restrictions: No      Mobility  Bed Mobility Overal bed mobility: Needs Assistance Bed Mobility: Supine to Sit     Supine to sit: Max assist     General bed mobility comments: assisted legs off EOB and lifted trunk upright as pt lethargic and initially not following commands  Transfers Overall transfer level: Needs assistance Equipment used: 2 person hand held assist Transfers: Sit to/from Stand;Stand Pivot Transfers Sit to Stand: Max assist;+2 physical assistance Stand pivot transfers: Max assist;+2 physical assistance       General transfer comment: initially stood from bed, but unable to take steps to [facility], then assisted to [facility] max A of 2, then to chair after upright, more  alert and able to understand plan able to take steps to chair +2 mod A  Ambulation/Gait                Stairs            Wheelchair Mobility    Modified Rankin (Stroke Patients Only) Modified Rankin (Stroke Patients Only) Pre-Morbid Rankin Score: No significant disability Modified Rankin: Severe disability     Balance Overall balance assessment: Needs assistance   Sitting balance-Leahy Scale: Fair       Standing balance-Leahy Scale: Poor                               Pertinent Vitals/Pain Pain Assessment: Faces Faces Pain Scale: Hurts little more Pain Location: general discomfort Pain Descriptors / Indicators: Discomfort;Grimacing;Guarding Pain Intervention(s): Limited activity within patient's tolerance;Monitored during session    Home Living Family/patient expects to be discharged to:: Skilled nursing facility Living Arrangements: Spouse/significant other Available Help at Discharge:  (resident of [name] [place]) Type of Home: House                Prior Function Level of Independence: Independent               Hand Dominance   Dominant Hand: Left    Extremity/Trunk Assessment   Upper Extremity Assessment Upper Extremity Assessment: Defer to OT evaluation RUE Deficits / Details: moving RUE out of synergy pattern. Initiating movement with RUE spontaneously. Poor grip/pinch strength. Unable to maintain graps on cloth. Will further assess RUE Sensation: decreased light touch  RUE Coordination: decreased fine motor;decreased gross motor    Lower Extremity Assessment Lower Extremity Assessment: RLE deficits/detail;LLE deficits/detail RLE Deficits / Details: moves antigravity some, assist for getting legs off bed initially due to difficutly following commands, but able to stand with assist x 3 LLE Deficits / Details: moves antigravity some, assist for getting legs off bed initially due to difficutly following commands, but able  to stand with assist x 3    Cervical / Trunk Assessment Cervical / Trunk Assessment: Other exceptions  Communication   Communication: Receptive difficulties;HOH  Cognition Arousal/Alertness: Lethargic Behavior During Therapy: Flat affect Overall Cognitive Status: Impaired/Different from baseline Area of Impairment: Orientation;Attention;Memory;Following commands;Safety/judgement;Awareness;Problem solving                   Current Attention Level: Sustained     Safety/Judgement: Decreased awareness of safety;Decreased awareness of deficits Awareness: Intellectual Problem Solving: Slow processing;Decreased initiation;Difficulty sequencing;Requires verbal cues;Requires tactile cues        General Comments General comments (skin integrity, edema, etc.): family in room and educated on basic conversation and yes/no questions for now; educated pt would have speech therapy today.    Exercises     Assessment/Plan    PT Assessment Patient needs continued PT services  PT Problem List Decreased strength;Decreased activity tolerance;Decreased balance;Decreased knowledge of use of DME;Decreased cognition;Decreased mobility;Decreased safety awareness;Decreased knowledge of precautions       PT Treatment Interventions DME instruction;Gait training;Therapeutic exercise;Patient/family education;Therapeutic activities;Balance training;Functional mobility training;Neuromuscular re-education    PT Goals (Current goals can be found in the Care Plan section)  Acute Rehab PT Goals Patient Stated Goal: per family to get therapy PT Goal Formulation: With patient/family Time For Goal Achievement: 07/06/17 Potential to Achieve Goals: Fair    Frequency Min 3X/week   Barriers to discharge        Co-evaluation               AM-PAC PT "6 Clicks" Daily Activity  Outcome Measure Difficulty turning over in bed (including adjusting bedclothes, sheets and blankets)?: Unable Difficulty  moving from lying on back to sitting on the side of the bed? : Unable Difficulty sitting down on and standing up from a chair with arms (e.g., wheelchair, bedside commode, etc,.)?: Unable Help needed moving to and from a bed to chair (including a wheelchair)?: A Lot Help needed walking in hospital room?: Total Help needed climbing 3-5 steps with a railing? : Total 6 Click Score: 7    End of Session Equipment Utilized During Treatment: Gait belt Activity Tolerance: Patient limited by fatigue Patient left: in chair;with call bell/phone within reach;with family/visitor present;with chair alarm set Nurse Communication: Mobility status PT Visit Diagnosis: Other symptoms and signs involving the nervous system (R29.898);Other abnormalities of gait and mobility (R26.89);Hemiplegia and hemiparesis Hemiplegia - Right/Left: Right Hemiplegia - dominant/non-dominant: Non-dominant Hemiplegia - caused by: Cerebral infarction    Time: 1250-1330 PT Time Calculation (min) (ACUTE ONLY): 40 min   Charges:   PT Evaluation $PT Eval High Complexity: 1 High     PT G CodesMagda Kiel, [place] 06/22/2017   Reginia Naas 06/22/2017, 4:31 PM

## 2017-06-22 NOTE — Progress Notes (Signed)
STROKE TEAM PROGRESS NOTE   HISTORY OF PRESENT ILLNESS (per record)  CC: Right sided weakness  History is obtained from:patient  HPI: Melinda Noble is a 83 y.o. female with a history of afib, not on anti-coagulants who presents with right sided weakness. She was initially plegic on the right, but by the time of arrival to the ED, was having significant improvement.  Her husband states that he spoke with her while he was getting out of bed and she was normal, she then was up getting ready when he heard her fall in the bathroom, and found her on the floor with a large R periorbital hematoma, right-sided weakness, and slurred speech.  Initial NIHSS 8.  Administered tPA bolus at 0941 on 06/20/2017. Stat MRI with high-grade stenosis or occlusion of the distal left M1 segment with nonvisualization of distal MCA branch vessels, became dyspneic, and was taken emergently to IR and intubated.  Per IR, "[w]ith x 2 passes with solitaire 89mm x 40 mm FR retriever device achieved a TICI 2 b reperfusion".      LKW: 6:30 am on 06/21/2017 tpa given?: yes Modified Rankin Score: 0 VAN: positive   Patient was administered IV t-PA at 0941 ib 06/20/2017. She was admitted to the neuro ICU for further evaluation and treatment.   SUBJECTIVE (INTERVAL HISTORY) Her husband and daughter and family friend Dr Anderson Malta are  at the bedside.  The patient was extubated this am during rounds and remains aphasic but is able to move right side OBJECTIVE Temp:  [98.2 F (36.8 C)-99.6 F (37.6 C)] 98.6 F (37 C) (09/04 1200) Pulse Rate:  [55-132] 63 (09/04 1300) Cardiac Rhythm: Heart block (09/04 0800) Resp:  [11-25] 13 (09/04 1300) BP: (99-172)/(37-114) 137/67 (09/04 1300) SpO2:  [99 %-100 %] 99 % (09/04 1300) FiO2 (%):  [40 %] 40 % (09/04 0802)  CBC:   Recent Labs Lab 06/20/17 0819 06/20/17 0827 06/21/17 0349  WBC 6.7  --  13.8*  NEUTROABS 4.3  --  11.1*  HGB 13.3 13.9 11.9*  HCT 41.6 41.0 37.4  MCV 94.5  --   94.7  PLT 157  --  956    Basic Metabolic Panel:   Recent Labs Lab 06/20/17 0819 06/20/17 0827 06/21/17 0349  NA 138 142 140  K 4.1 4.1 3.9  CL 108 107 113*  CO2 22  --  20*  GLUCOSE 131* 131* 115*  BUN 16 20 15   CREATININE 0.81 0.80 0.75  CALCIUM 10.0  --  8.9  MG  --   --  1.7  PHOS  --   --  2.8    Lipid Panel:     Component Value Date/Time   CHOL 133 06/21/2017 0349   CHOL 152 01/26/2017 0855   TRIG 121 06/21/2017 0349   HDL 45 06/21/2017 0349   HDL 56 01/26/2017 0855   CHOLHDL 3.0 06/21/2017 0349   VLDL 24 06/21/2017 0349   LDLCALC 64 06/21/2017 0349   LDLCALC 78 01/26/2017 0855   HgbA1c:  Lab Results  Component Value Date   HGBA1C 5.4 06/21/2017   Urine Drug Screen:     Component Value Date/Time   LABOPIA NONE DETECTED 06/20/2017 2201   COCAINSCRNUR NONE DETECTED 06/20/2017 2201   LABBENZ POSITIVE (A) 06/20/2017 2201   AMPHETMU NONE DETECTED 06/20/2017 2201   THCU NONE DETECTED 06/20/2017 2201   LABBARB NONE DETECTED 06/20/2017 2201    Alcohol Level No results found for: [place]  Head Code Stroke Wo Contrast 06/20/2017 IMPRESSION: 1. No significant interval change. 2. Stable atrophy and white matter disease. 3. No definite hyperdense vessel. 4. Stable right periorbital hematoma. 5. ASPECTS is 10/10 These results were called by telephone at the time of interpretation on 06/20/2017 at 10:05 am to Dr. Roland Rack , who verbally acknowledged these results.  Ct Head Code Stroke Wo Contrast 06/20/2017 IMPRESSION: 1. Stable atrophy and white matter disease. 2. No acute intracranial abnormality or significant interval change. 3. Prominent right infraorbital and lateral soft tissue hematoma without underlying fracture or intraorbital extension. 4. ASPECTS is 10/10  Ct Head Wo Contrast 06/20/2017 IMPRESSION: 1. Focal density posteriorly in the left sylvian fissure may reflect contrast associated with a more distal occlusion. 2. No acute hemorrhage.  3. No evidence for progressing infarct. 4. Stable atrophy and white matter disease. 5. Increasing size of right infraorbital hematoma.   Ct Head Wo Contrast 06/21/2017 IMPRESSION: 1. New left posterolateral temporal lobe acute infarction. 2. Stable infarction centered in left frontal centrum semiovale extending into left superior lentiform nucleus and insula in comparison with prior CT/MRI head. Interval increase in edema and local mass effect associated with the infarct. 3. No acute intracranial hemorrhage. These results will be called to the ordering clinician or representative by the Radiologist Assistant, and communication documented in the PACS or zVision Dashboard.   Ct Maxillofacial Wo Contrast 06/20/2017 IMPRESSION: 1. No maxillofacial fracture. 2. Right infraorbital and periorbital soft tissue hematoma.   [name] Head Wo Contrast 06/20/2017 IMPRESSION: 1. High-grade stenosis or occlusion of the distal left M1 segment with nonvisualization of distal MCA branch vessels. This corresponds with the patient's acute symptoms, likely representing an emergent large vessel occlusion. 2. Narrowing of the right M1 segment and attenuation of MCA branch vessels likely related to atherosclerotic disease. 3. Posterior circulation is intact. 4. Ill-defined area of restricted diffusion within white matter of the left centrum semi ovale may reflect acute ischemia. No definite cortical infarct is present.  Mr Brain Wo Contrast 06/21/2017 IMPRESSION: 1. Increased size of acute/subacute left MCA territory infarct, now involving the left insular cortex, left frontal operculum, posterior left temporal and parietal lobe, and more anterior superior left frontal lobe. 2. No evidence for hemorrhagic conversion of left-sided infarcts. 3. Focal acute nonhemorrhagic infarct in the right parietal lobe and centrum semiovale. Bilateral disease raises concern for a central cardio embolic source. 4. Right periorbital hematoma.   Dg  Chest Port 1 View 06/20/2017 IMPRESSION: Endotracheal tube in good position Cardiomegaly and mild central venous congestion.  Dg Chest Port 1 View 06/20/2017 IMPRESSION: 1. Stable cardiomegaly.  Aortic atherosclerosis with mild uncoiling. 2. Slightly low-lying endotracheal tube at 2.6 cm above the carina. Slight pullback of the of the ETT is recommended by 1 cm. 3. No acute pulmonary disease.   TTE 06/20/2017  pending     PHYSICAL EXAM Elderly Caucasian lady  . Marland Kitchen Afebrile. Head is nontraumatic. Neck is supple without bruit.    Cardiac exam no murmur or gallop. Lungs are clear to auscultation. Distal pulses are well felt. Neurological Exam :  Awake alert. Aphasic and will not follow any commands consistently but will mimic.Left gaze preference. Blinks to threat on the right Pupils irregular reactive. Fundi not visualized. Right lower facial weakness. Tongue midline. Motor system exam shows right hemiparesis 3/5 strength with trace withdrawal right upper extremity and modest withdrawal in the right lower extremityr Purposeful antigravity spontaneous movements of left side. DTRs depressed on right and normal  on left side. Rt Plantar upgoing and left downgoing. ASSESSMENT/PLAN Melinda Noble is a 83 y.o. female with history of history of afib, not on anti-coagulants who presents with right sided weakness.  She is status post IV tPA and IR mechanical thrombectomy.  Stroke: acute/subacute L MCA infarct, involving L insular cortex, L frontal operculum, L posterior temporal and parietal lobe, and more anterior L superior frontal lobe, as well as acute infarct R parietal lobe and Rcentrum semiovale, embolic, in the setting of atheorsclerosis.  Resultant  Aphasia and right hemiplegia  CT head: no acute stroke.  Prominent right infraorbital and lateral soft tissue hematoma without underlying fracture or intraorbital extension  MRI head: acute/subacute L MCA infarct, involving the left insular cortex,  left frontal operculum, posterior left temporal and parietal lobe, and more anterior superior left frontal lobe.  Focal acute nonhemorrhagic infarct in the right parietal lobe and centrum semiovale.  MRA head: High-grade L M1 stenosis.  Ill-defined area of restricted diffusion within white matter of the left centrum semi ovale may reflect acute ischemia  2D Echo  pending  LDL 64  HgbA1c 5.4  SCDs for VTE prophylaxis Diet NPO time specified  aspirin 81 mg daily prior to admission, now on aspirin 300 mg suppository daily  Patient counseled to be compliant with her antithrombotic medications  Ongoing aggressive stroke risk factor management  Therapy recommendations:   pending  Disposition:  pending  Hypertension  Unstable  Nicardipine gtt yesterday.  Now off.  Permissive hypertension (OK if < 220/120) but gradually normalize in 5-7 days  Long-term BP goal normotensive  Hyperlipidemia  Home meds: Rosuvastatin 5 mg daily, resumed in hospital  LDL 64, goal < 70  Continue statin at discharge  Other Stroke Risk Factors  Advanced age  Obesity, Body mass index is 32.25 kg/m., recommend weight loss, diet and exercise as appropriate   Coronary artery disease  Other Active Problems  None  Hospital day # 2  I have personally examined this patient, reviewed notes, independently viewed imaging studies, participated in medical decision making and plan of care.ROS completed by me personally and pertinent positives fully documented  I have made any additions or clarifications directly to the above note. She presented with left M1 occlusion and got worse after iv TPA requring emergent mechanical thrombectomy but remains aphasic with hemiplegia. Recommend check swallow eval.Continue aspirin and ongoing stroke w/u. Long d/w patient, husband, daughter and son inlaw  Over the phone.. Family is realistic about the expectations and for the moment agree to full code but if she were to be  reintubated may not want prolonged ventilatory support and tracheostomy. Discuss with Dr. Nelda Marseille This patient is critically ill and at significant risk of neurological worsening, death and care requires constant monitoring of vital signs, hemodynamics,respiratory and cardiac monitoring, extensive review of multiple databases, frequent neurological assessment, discussion with family, other specialists and medical decision making of high complexity.I have made any additions or clarifications directly to the above note.This critical care time does not reflect procedure time, or teaching time or supervisory time of PA/NP/Med Resident etc but could involve care discussion time.  I spent 30 minutes of neurocritical care time  in the care of  this patient.      Antony Contras, MD Medical Director [facility] Pager: [phone] 06/22/2017 3:00 PM   To contact Stroke Continuity provider, please refer to [email or web address] After hours, contact General Neurology

## 2017-06-22 NOTE — Progress Notes (Signed)
OT Cancellation Note  Patient Details Name: Melinda Noble MRN: [id number] DOB: 24-Oct-1929   Cancelled Treatment:    Reason Eval/Treat Not Completed: Patient not medically ready  [place], OT/L  [phone] 06/22/2017 06/22/2017, 9:11 AM

## 2017-06-22 NOTE — Progress Notes (Signed)
   Echocardiogram was attempted but was asked to try again tomorrow per RN due to patient status.   Jennette Dubin 06/22/2017, 3:33 PM

## 2017-06-22 NOTE — Procedures (Signed)
Extubation Procedure Note  Patient Details:   Name: [place] SARINANA DOB: [birth year] MRN: [id number]   Airway Documentation:     Evaluation  O2 sats: stable throughout Complications: No apparent complications Patient did tolerate procedure well. Bilateral Breath Sounds: Rhonchi   Yes   Pt. Was extubated to a 3.5L [place] without any complications, dyspnea or stridor noted.   Lawanda Holzheimer L 06/22/2017, 10:02 AM

## 2017-06-22 NOTE — Progress Notes (Signed)
Patient ID: Melinda Noble, female   DOB: 22-Dec-1929, 83 y.o.   MRN: [id number]     Supervising Physician: Luanne Bras  Patient Status: [facility] - In-pt  Chief Complaint: CVA  Subjective: Patient still with slurred speech, very difficult to understand.  Called to see patient secondary to hematoma and bleeding at old sheath site.  Allergies: Ace inhibitors; Beta adrenergic blockers; Iohexol; Atorvastatin; Latex; Penicillins; and Sulfa antibiotics  Medications: Prior to Admission medications   Medication Sig Start Date End Date Taking? Authorizing Provider  acetaminophen (TYLENOL) 325 MG tablet Take 650 mg by mouth every 6 (six) hours as needed for mild pain.   Yes [provider]  ALPRAZolam (XANAX) 0.25 MG tablet TAKE (1) TABLET TWICE DAILY AS NEEDED FOR ANXIETY. Patient taking differently: TAKE HALF TABLET (0.125mg ) TWICE DAILY AS NEEDED FOR ANXIETY. 04/19/17  Yes Reed, Tiffany L, DO  aspirin 81 MG chewable tablet Chew 1 tablet (81 mg total) by mouth daily. 10/17/13  Yes Lyda Jester M, PA-C  Biotin 1000 MCG tablet Take 1,000 mcg by mouth daily.   Yes [provider]  buPROPion (WELLBUTRIN SR) 150 MG 12 hr tablet Take 150 mg by mouth every morning.  08/22/16  Yes [provider]  calcium-vitamin D (OSCAL WITH D) 500-200 MG-UNIT per tablet Take 1 tablet by mouth daily.    Yes [provider]  carvedilol (COREG) 6.25 MG tablet Take 1 tablet (6.25 mg total) by mouth 2 (two) times daily with a meal. 04/09/14  Yes Leonie Man, MD  cholecalciferol (VITAMIN D) 1000 UNITS tablet Take 1,000 Units by mouth daily.   Yes [provider]  clobetasol ointment (TEMOVATE) 8.33 % Apply 1 application topically 2 (two) times daily as needed. (affected areas)--also thighs 07/29/16  Yes Reed, Tiffany L, DO  clopidogrel (PLAVIX) 75 MG tablet Take 75 mg by mouth daily.   Yes [provider]  fexofenadine (ALLEGRA) 60 MG tablet Take 60 mg by mouth 2  (two) times daily as needed for allergies or rhinitis.   Yes [provider]  furosemide (LASIX) 20 MG tablet TAKE 1/2 TABLET DAILY. Patient taking differently: TAKE 1/2 TABLET (10mg ) DAILY. 05/03/17  Yes Reed, Tiffany L, DO  losartan (COZAAR) 100 MG tablet Take 1 tablet (100 mg total) by mouth daily. 12/30/16 06/21/17 Yes Belva Crome, MD  montelukast (SINGULAIR) 10 MG tablet Take 10 mg by mouth at bedtime.   Yes [provider]  Multiple Vitamin (MULTIVITAMIN WITH MINERALS) TABS tablet Take 1 tablet by mouth daily.   Yes [provider]  nitroGLYCERIN (NITROSTAT) 0.4 MG SL tablet Place 0.4 mg under the tongue every 5 (five) minutes as needed for chest pain.   Yes [provider]  omeprazole (PRILOSEC) 20 MG capsule Take 20 mg by mouth daily.    Yes [provider]  Polyethyl Glycol-Propyl Glycol (SYSTANE OP) Apply 1 drop to eye daily as needed (dry eyes).   Yes [provider]  rosuvastatin (CRESTOR) 5 MG tablet Take 10 mg by mouth 3 (three) times a week. (Mondays, Wednesdays, and Fridays)   Yes [provider]  sertraline (ZOLOFT) 100 MG tablet Take 100 mg by mouth daily.  07/06/16  Yes [provider]  SYNTHROID 100 MCG tablet TAKE 1 TABLET IN THE MORNING ON AN EMPTY STOMACH. Patient taking differently: TAKE 1 TABLET (144mcg) IN THE MORNING ON AN EMPTY STOMACH. 05/17/17  Yes Reed, Tiffany L, DO  vitamin E (VITAMIN E) 400 UNIT capsule Take  400 Units by mouth daily.   Yes [provider]    Vital Signs: BP 137/67   Pulse 63   Temp 98.6 F (37 C) (Axillary)   Resp 13   Ht 5\' 8"  (1.727 m)   Wt 212 lb 1.3 oz (96.2 kg) Comment: Per ED weighing bed prior to CT  SpO2 99%   BMI 32.25 kg/m   Physical Exam: Skin: R CFA site with large ecchymosis and large hematoma present.  The hematoma is hard to touch and is tender.  Pressure has been held and bleeding has ceased at the moment.  A pressure dressing is  applied.  Imaging: Ct Head Wo Contrast  Result Date: 06/21/2017 CLINICAL DATA:  83 y/o  F; stroke for follow-up. EXAM: CT HEAD WITHOUT CONTRAST TECHNIQUE: Contiguous axial images were obtained from the base of the skull through the vertex without intravenous contrast. COMPARISON:  06/20/2017 CT head FINDINGS: Brain: Hypoattenuation within the left insula, left superior lentiform nucleus, left centrum semiovale compatible with infarction, stable in distribution comparison with the prior CT of head. Mild interval increase in hypoattenuation and edema associated with the infarct. Left posterior temporal and loss of gray-white differentiation compatible with acute infarction, not present on prior MRI or CT of the head. No interval hemorrhage or significant mass effect. No effacement of basilar cisterns. Diminished size of density in the posterior left sylvian fissure. Stable chronic microvascular ischemic changes of the brain and parenchymal volume loss. Vascular: Calcific atherosclerosis of carotid siphons. Skull: Normal. Negative for fracture or focal lesion. Sinuses/Orbits: No acute finding. Other: None. IMPRESSION: 1. New left posterolateral temporal lobe acute infarction. 2. Stable infarction centered in left frontal centrum semiovale extending into left superior lentiform nucleus and insula in comparison with prior CT/MRI head. Interval increase in edema and local mass effect associated with the infarct. 3. No acute intracranial hemorrhage. These results will be called to the ordering clinician or representative by the Radiologist Assistant, and communication documented in the PACS or zVision Dashboard. Electronically Signed   By: Kristine Garbe M.D.   On: 06/21/2017 04:57   Ct Head Wo Contrast  Result Date: 06/20/2017 CLINICAL DATA:  Status post revascularization of the left MCA. Right-sided defects. EXAM: CT HEAD WITHOUT CONTRAST TECHNIQUE: Contiguous axial images were obtained from the base of  the skull through the vertex without intravenous contrast. COMPARISON:  CT head without contrast of the same day. FINDINGS: Brain: A focal density is noted posteriorly within the left sylvian fissure. This may represent contrast adjacent to a distal occlusion. There was faint density in this area on the earlier study in retrospect. No hemorrhage or mass lesion is present. Diffuse atrophy and white matter changes are otherwise stable. No focal cortical defects are present. The ventricles are stable. Vascular: Contrast is now noted within the major intracranial artery is. Distal left MCA lesion may be present as discussed above. Skull: Calvarium is intact. No focal lytic or blastic lesions are present. Sinuses/Orbits: The paranasal sinuses and mastoid air cells are clear. Bilateral lens replacements are present. The globes and orbits are otherwise normal. Other: Right infraorbital hematoma has expanded. IMPRESSION: 1. Focal density posteriorly in the left sylvian fissure may reflect contrast associated with a more distal occlusion. 2. No acute hemorrhage. 3. No evidence for progressing infarct. 4. Stable atrophy and white matter disease. 5. Increasing size of right infraorbital hematoma. Electronically Signed   By: San Morelle M.D.   On: 06/20/2017 12:48   Mr Hal Neer  Contrast  Result Date: 06/20/2017 CLINICAL DATA:  Acute onset of right-sided weakness. Code stroke. Fall with right facial hematoma. EXAM: MRI HEAD WITHOUT CONTRAST MRA HEAD WITHOUT CONTRAST TECHNIQUE: Multiplanar, multiecho pulse sequences of the brain and surrounding structures were obtained without intravenous contrast. Angiographic images of the head were obtained using MRA technique without contrast. COMPARISON:  CT head without contrast from the same day. FINDINGS: MRI HEAD FINDINGS Diffusion only imaging was requested by Dr. Leonel Ramsay. The do pack scratched at the axial diffusion weighted images are mildly degraded by patient  motion. There is an ill-defined area of potential restricted diffusion involving the left centrum semi ovale measuring 12 x 38 mm. No definite cortical lesions are present. There is no evidence for parenchymal hemorrhage. Right periorbital soft tissue hematoma is again noted. MRA HEAD FINDINGS The time-of-flight images are moderately degraded by patient motion. Flow is present in the internal carotid artery is through the ICA termini bilaterally. The A1 segments are patent bilaterally. ACA vessels are seen bilaterally. There is some narrowing of the distal right M1 segment. There is significant signal loss in the distal left M1 segment after an early inferior branch. Left MCA branch vessels are not visualized. There is marked attenuation of right MCA branch vessels. The left vertebral artery is the dominant vessel. The basilar artery is patent. Both posterior cerebral arteries originate from basilar tip. PCA branch vessels are attenuated bilaterally. IMPRESSION: 1. High-grade stenosis or occlusion of the distal left M1 segment with nonvisualization of distal MCA branch vessels. This corresponds with the patient's acute symptoms, likely representing an emergent large vessel occlusion. 2. Narrowing of the right M1 segment and attenuation of MCA branch vessels likely related to atherosclerotic disease. 3. Posterior circulation is intact. 4. Ill-defined area of restricted diffusion within white matter of the left centrum semi ovale may reflect acute ischemia. No definite cortical infarct is present. These results were called by telephone at the time of interpretation on 06/20/2017 at 9:28 am to Dr. Roland Rack , who verbally acknowledged these results. Electronically Signed   By: San Morelle M.D.   On: 06/20/2017 09:40   Mr Brain Wo Contrast  Result Date: 06/21/2017 CLINICAL DATA:  Right hemiplegia and aphasia. The patient remains intubated. EXAM: MRI HEAD WITHOUT CONTRAST TECHNIQUE: Multiplanar,  multiecho pulse sequences of the brain and surrounding structures were obtained without intravenous contrast. COMPARISON:  CT head without contrast 06/21/2017 at 4:41 a.m. CT head without contrast 06/20/2017 12:08 p.m. MRI brain and 06/20/2017 at 9:13 a.m. FINDINGS: Brain: Diffusion-weighted images now demonstrate significant extension of left MCA territory infarct. There is diffuse restricted diffusion at the left insular ribbon and a left frontal operculum. There is extensive involvement of the posterior left temporal lobe and parietal lobe. Smaller focal cortical involvement is seen in the anterior left frontal lobe and posterior left parietal lobe. A focal area of restricted cortical will diffusion is present in the posterior right parietal lobe with scattered foci involving the posterior right centrum semiovale. Vascular: Abnormal signal is present in the proximal posterior left M2 branch suggesting slow or occluded flow. Proximal flow is present in the major intracranial arteries. Skull and upper cervical spine: The skullbase is within normal limits. The craniocervical junction is normal. Marrow signal is normal. The upper cervical spine is unremarkable. Sinuses/Orbits: Mild mucosal thickening is present within the anterior ethmoid air cells. The paranasal sinuses are otherwise clear. Mild mastoid fluid is now present. There is some fluid in the posterior oropharynx as well.  The patient is intubated. Bilateral lens replacements are present. The globes and orbits are within normal limits. Other: Right periorbital hematoma is again noted. IMPRESSION: 1. Increased size of acute/subacute left MCA territory infarct, now involving the left insular cortex, left frontal operculum, posterior left temporal and parietal lobe, and more anterior superior left frontal lobe. 2. No evidence for hemorrhagic conversion of left-sided infarcts. 3. Focal acute nonhemorrhagic infarct in the right parietal lobe and centrum  semiovale. Bilateral disease raises concern for a central cardio embolic source. 4. Right periorbital hematoma. Electronically Signed   By: San Morelle M.D.   On: 06/21/2017 12:45   Mr Brain Wo Contrast  Result Date: 06/20/2017 CLINICAL DATA:  Acute onset of right-sided weakness. Code stroke. Fall with right facial hematoma. EXAM: MRI HEAD WITHOUT CONTRAST MRA HEAD WITHOUT CONTRAST TECHNIQUE: Multiplanar, multiecho pulse sequences of the brain and surrounding structures were obtained without intravenous contrast. Angiographic images of the head were obtained using MRA technique without contrast. COMPARISON:  CT head without contrast from the same day. FINDINGS: MRI HEAD FINDINGS Diffusion only imaging was requested by Dr. Leonel Ramsay. The do pack scratched at the axial diffusion weighted images are mildly degraded by patient motion. There is an ill-defined area of potential restricted diffusion involving the left centrum semi ovale measuring 12 x 38 mm. No definite cortical lesions are present. There is no evidence for parenchymal hemorrhage. Right periorbital soft tissue hematoma is again noted. MRA HEAD FINDINGS The time-of-flight images are moderately degraded by patient motion. Flow is present in the internal carotid artery is through the ICA termini bilaterally. The A1 segments are patent bilaterally. ACA vessels are seen bilaterally. There is some narrowing of the distal right M1 segment. There is significant signal loss in the distal left M1 segment after an early inferior branch. Left MCA branch vessels are not visualized. There is marked attenuation of right MCA branch vessels. The left vertebral artery is the dominant vessel. The basilar artery is patent. Both posterior cerebral arteries originate from basilar tip. PCA branch vessels are attenuated bilaterally. IMPRESSION: 1. High-grade stenosis or occlusion of the distal left M1 segment with nonvisualization of distal MCA branch vessels. This  corresponds with the patient's acute symptoms, likely representing an emergent large vessel occlusion. 2. Narrowing of the right M1 segment and attenuation of MCA branch vessels likely related to atherosclerotic disease. 3. Posterior circulation is intact. 4. Ill-defined area of restricted diffusion within white matter of the left centrum semi ovale may reflect acute ischemia. No definite cortical infarct is present. These results were called by telephone at the time of interpretation on 06/20/2017 at 9:28 am to Dr. Roland Rack , who verbally acknowledged these results. Electronically Signed   By: San Morelle M.D.   On: 06/20/2017 09:40   Dg Chest Port 1 View  Result Date: 06/20/2017 CLINICAL DATA:  routine EXAM: PORTABLE CHEST 1 VIEW COMPARISON:  9.2.18 FINDINGS: Endotracheal to 3.6 cm from carina in good position. Two stable large cardiac silhouette. Mild venous congestion. No infiltrate or pneumothorax. IMPRESSION: Endotracheal tube in good position Cardiomegaly and mild central venous congestion. Electronically Signed   By: Suzy Bouchard M.D.   On: 06/20/2017 17:20   Dg Chest Port 1 View  Result Date: 06/20/2017 CLINICAL DATA:  Acute respiratory failure with hypoxia EXAM: PORTABLE CHEST 1 VIEW COMPARISON:  05/04/2016 CXR, chest CT 10/09/2013 FINDINGS: The tip of an endotracheal tube is 2.6 cm the carina. Cardiomegaly is noted with tortuous atherosclerotic aorta. No aneurysm. Small  granuloma is seen along the periphery of the left lower lobe, confirmed on prior CT. No effusion or pneumothorax no overt pulmonary edema or pneumonic consolidation. IMPRESSION: 1. Stable cardiomegaly.  Aortic atherosclerosis with mild uncoiling. 2. Slightly low-lying endotracheal tube at 2.6 cm above the carina. Slight pullback of the of the ETT is recommended by 1 cm. 3. No acute pulmonary disease. Electronically Signed   By: Ashley Royalty M.D.   On: 06/20/2017 16:07   Ct Head Code Stroke Wo Contrast  Result  Date: 06/20/2017 CLINICAL DATA:  Code stroke. Change in mental status while in the MRI scanner. EXAM: CT HEAD WITHOUT CONTRAST TECHNIQUE: Contiguous axial images were obtained from the base of the skull through the vertex without intravenous contrast. COMPARISON:  None. FINDINGS: Brain: The study is moderately degraded by patient motion. There is no significant change in atrophy white matter disease. No new cortical lesions are present. Asymmetric white matter hypoattenuation is present in the left corona radiata. Vascular: No definite hyperdense vessel. Skull: Calvarium is intact. Right periorbital soft tissue hematoma has not changed significantly. Sinuses/Orbits: The paranasal sinuses and mastoid air cells are clear. Globes and orbits are otherwise unremarkable. ASPECTS [facility] Stroke Program Early CT Score) - Ganglionic level infarction (caudate, lentiform nuclei, internal capsule, insula, M1-M3 cortex): 7/7 - Supraganglionic infarction (M4-M6 cortex): 3/3 Total score (0-10 with 10 being normal): 10/10 IMPRESSION: 1. No significant interval change. 2. Stable atrophy and white matter disease. 3. No definite hyperdense vessel. 4. Stable right periorbital hematoma. 5. ASPECTS is 10/10 These results were called by telephone at the time of interpretation on 06/20/2017 at 10:05 am to Dr. Roland Rack , who verbally acknowledged these results. Electronically Signed   By: San Morelle M.D.   On: 06/20/2017 10:05   Ct Head Code Stroke Wo Contrast  Result Date: 06/20/2017 CLINICAL DATA:  Code stroke. Acute onset of right-sided weakness and right facial droop with slurred speech. Fall. Right periorbital hematoma. EXAM: CT HEAD WITHOUT CONTRAST TECHNIQUE: Contiguous axial images were obtained from the base of the skull through the vertex without intravenous contrast. COMPARISON:  CT head without contrast 01/27/2013 FINDINGS: Brain: Mild atrophy and white matter changes are present bilaterally. The basal  ganglia are intact. Insular ribbon is normal bilaterally. No acute or focal cortical abnormality is present. Vascular: Atherosclerotic calcifications are present within the cavernous internal carotid artery is bilaterally. There is no hyperdense vessel. Skull: The calvarium is intact. No focal lytic or blastic lesions are present. Sinuses/Orbits: The paranasal sinuses and mastoid air cells are clear. The patient is status post bilateral lens replacements. May prominent right infraorbital hematoma is present without an underlying fracture. Zygomatic arch and orbital rim are intact. ASPECTS [facility] Stroke Program Early CT Score) - Ganglionic level infarction (caudate, lentiform nuclei, internal capsule, insula, M1-M3 cortex): 7/7 - Supraganglionic infarction (M4-M6 cortex): 3/3 Total score (0-10 with 10 being normal): 10/10 IMPRESSION: 1. Stable atrophy and white matter disease. 2. No acute intracranial abnormality or significant interval change. 3. Prominent right infraorbital and lateral soft tissue hematoma without underlying fracture or intraorbital extension. 4. ASPECTS is 10/10 These results were called by telephone at the time of interpretation on 06/20/2017 at 8:28 am to Dr. Leonel Ramsay, who verbally acknowledged these results. Electronically Signed   By: San Morelle M.D.   On: 06/20/2017 08:35   Ct Maxillofacial Wo Contrast  Result Date: 06/20/2017 CLINICAL DATA:  Right periorbital hematoma after falling and hitting the right side of her face. EXAM: CT MAXILLOFACIAL  WITHOUT CONTRAST TECHNIQUE: Multidetector CT imaging of the maxillofacial structures was performed. Multiplanar CT image reconstructions were also generated. COMPARISON:  Head CT obtained earlier today. FINDINGS: Osseous: No fractures. Orbits: Status post bilateral cataract extraction. Sinuses: Normally pneumatized. Soft tissues: Right infraorbital and periorbital hematoma. Limited intracranial: Per the head CT report earlier today.  IMPRESSION: 1. No maxillofacial fracture. 2. Right infraorbital and periorbital soft tissue hematoma. Electronically Signed   By: Claudie Revering M.D.   On: 06/20/2017 09:02    Labs:  CBC:  Recent Labs  06/20/17 0819 06/20/17 0827 06/21/17 0349  WBC 6.7  --  13.8*  HGB 13.3 13.9 11.9*  HCT 41.6 41.0 37.4  PLT 157  --  174    COAGS:  Recent Labs  06/20/17 0819  INR 0.94  APTT 32    BMP:  Recent Labs  01/26/17 0855 06/20/17 0819 06/20/17 0827 06/21/17 0349  NA 140 138 142 140  K 4.7 4.1 4.1 3.9  CL 104 108 107 113*  CO2 21 22  --  20*  GLUCOSE 101* 131* 131* 115*  BUN 17 16 20 15   CALCIUM 10.4* 10.0  --  8.9  CREATININE 0.79 0.81 0.80 0.75  GFRNONAA 69 >60  --  >60  GFRAA 79 >60  --  >60    LIVER FUNCTION TESTS:  Recent Labs  01/26/17 0855 06/20/17 0819 06/21/17 0349  BILITOT 0.5 0.6  --   AST 23 26  --   ALT 20 23  --   ALKPHOS 84 72  --   PROT 6.7 6.7  --   ALBUMIN 4.2 3.9 3.0*    Assessment and Plan: S/P bilateral common carotid arteriograms followed byendovascular revascularization of occluded Lt MCA  With x 2 passes with solitaire 43mm x 40 mm FR retriever device achieving a TICI 2 b reperfusion Right inguinal hematoma  Patient is placed back on bed rest.  She is to lay flat and still for 6 hours per Dr. Estanislado Pandy.  The nurses will check her site q 30 minutes initially.   We will check on her tomorrow to determine when she can come off bedrest again.  Electronically Signed: Henreitta Cea 06/22/2017, 4:25 PM   I spent a total of 15 Minutes at the the patient's bedside AND on the patient's hospital floor or unit, greater than 50% of which was counseling/coordinating care for right inguinal hematoma, CVA

## 2017-06-22 NOTE — Plan of Care (Signed)
Problem: Activity: Goal: Risk for activity intolerance will decrease Outcome: Not Progressing Hematoma worse after ambulating in room and sitting in chair today. Pt on bedrest until tomorrow evening.

## 2017-06-22 NOTE — Progress Notes (Signed)
This RN assumed care of pt at 1500. This RN assisted pt back to bed from chair. Noted hematoma larger than reported by previous RN. No active bleeding. Placed pt in supine position and applied pressure for 20 minutes until [place] arrived to evaluate. See assoc orders.

## 2017-06-22 NOTE — Plan of Care (Signed)
Problem: Self-Care: Goal: Ability to participate in self-care as condition permits will improve Outcome: Progressing Pt able to stand and pivot to chair, stand and pivot to bedside commode, and stand and pivot to bed w/ 2 staff assist.

## 2017-06-22 NOTE — Evaluation (Signed)
Speech Language Pathology Evaluation Patient Details Name: Melinda Noble MRN: [id number] DOB: 1929-12-07 Today's Date: 06/22/2017 Time: 3154-0086 SLP Time Calculation (min) (ACUTE ONLY): 27 min  Problem List:  Patient Active Problem List   Diagnosis Date Noted  . Stroke (cerebrum) ([place]) 06/20/2017  . Hypothyroidism 06/03/2016  . Polypharmacy 06/03/2016  . H/O amiodarone therapy 02/08/2014  . Chronic systolic heart failure ([place]) 02/08/2014  . Left shoulder pain 01/15/2014  . Chest pain 11/25/2013  . Obesity (BMI 30.0-34.9) 11/06/2013  . H/o Orthostatic hypotension, not on diuretic due to this 10/24/2013  . Generalized anxiety disorder 10/18/2013  . HTN (hypertension) 10/16/2013  . Cardiomyopathy, ischemic- 30-35% on admission 10/09/13 --> increased to 40-45% prior to discharge. 10/16/2013  . Dyslipidemia 10/16/2013  . PAF- peri-MI. NSR on Amio 10/16/2013  . Acute pulmonary edema with congestive heart failure - due to Anterior STEMI- resolved 10/11/2013  . Cardiogenic shock- resolved 10/10/2013  . Presence of drug coated stent in LAD coronary artery - 2 overlapping Promx DES 2.5 x 38, 2.5 x 12 (postdilated to ~3 mm) 10/10/2013  . Metabolic acidosis- resolved 10/10/2013  . Acute respiratory failure due to hypoxia from acute pulmonary edema- resolved 10/10/2013  . CAD S/P percutaneous coronary angioplasty -- PCI to LAD in setting Ant STEMI; 2 overlapping Promx DES 2.5 x 38, 2.5 x 12 (postdilated to ~3 mm) 10/09/2013   Past Medical History:  Past Medical History:  Diagnosis Date  . Anemia   . CAD (coronary artery disease)    a. STEMI 10/09/13 - 100% LAD - following initial angioplasty, ? Spontaneous dissection vs related to ballon. s/p complex PCI with 2 overlapping DES to LAD. Adm complicated by VDRF, cardiogenic shock, peri-MI PAF with NSR on amiodarone. Residual mod RCA disease.   Marland Kitchen CAD S/P percutaneous coronary angioplasty 10/09/13   DES x 2 to Mid LAD, 2.5 x 38, 2.5 x 12 Promus  (covering extensive disection), moderate RCA disesase  . Cardiogenic shock ([place])    a. 09/2013 - during admission for STEMI.  . Depression   . Dyslipidemia   . H/O Acute respiratory failure due to hypoxia 10/10/2013   a. 09/2013 - secondary to anterior STEMI related pulmonary edema (resolved).  . Hypertension   . Hypothyroidism   . Ischemic cardiomyopathy    a. EF 30-35% during 09/2013 adm for STEMI. b. Repeat Echo EF 40-45% with distal LAD hypokinesis (10/16/14 following STEMI recovery).  . Migraine headache    a. Remotely.  . Orthostatic hypotension    a. Adm 10/2013 for this, felt due to Lasix. Not on regular diuretic due to this/dehydration.  Marland Kitchen PAF (paroxysmal atrial fibrillation) ([place])    a. 09/2013 - peri-MI, NSR at discharge on amiodarone.  . Sinus bradycardia    Past Surgical History:  Past Surgical History:  Procedure Laterality Date  . CORONARY ANGIOPLASTY WITH STENT PLACEMENT  10/09/13   2 overlapping Promus DES to mid LAD, occlusion/dissection (2.5 x 38, 2.5 x 12)  . LEFT HEART CATHETERIZATION WITH CORONARY ANGIOGRAM N/A 10/09/2013   Procedure: LEFT HEART CATHETERIZATION WITH CORONARY ANGIOGRAM;  Surgeon: Leonie Man, MD;  Location: [facility] CATH LAB;  Service: Cardiovascular;  Laterality: N/A;  . RADIOLOGY WITH ANESTHESIA N/A 06/20/2017   Procedure: RADIOLOGY WITH ANESTHESIA;  Surgeon: Luanne Bras, MD;  Location: [place];  Service: Radiology;  Laterality: N/A;  . SHOULDER SURGERY     HPI:  83 year old female w/ sig h/o atrial fib (not on systemic A/C). Presented to the ER  on 9/2 w/ new (R) sided hemiplegia.  Dx  left MCA CVA, S/P endovascularization in IR.  ETT 9/2-9/4.   MRI: acute/subacute L MCA infarct, involving L insular cortex, L frontal operculum, L posterior temporal and parietal lobe, and more anterior L superior frontal lobe, as well as acute infarct R parietal lobe and Rcentrum semiovale, embolic, in the setting of atheorsclerosis.   Assessment / Plan /  Recommendation Clinical Impression  Pt presents with significant mixed aphasia marked by dysfluent, single word output which tends to lead to perseveratory expression.  She is able to produce occasional words ("okay" "table" "one"); she did not follow commands.  Both speech and motor movements are apraxic and perseveratory.  She could not produce automatic sequences (counting, days of week, etc.) Pt nods, smiles, and engages socially, but auditory comprehension is severely impaired today (poor yes/no reliability; unable to discriminate between two common objects).  Spoke with Mr. Antonopoulos and pt's daughter at length re: nature of aphasia, trajectory of communication recovery in next few days giving [place] a better idea of overall recovery.  Recommend SLP to address aphasia, family education.  Pt will need intensive SLP therapy in this and next venue of care.     SLP Assessment  SLP Recommendation/Assessment: Patient needs continued Speech Lanaguage Pathology Services SLP Visit Diagnosis: Dysphagia, unspecified (R13.10)    Follow Up Recommendations       Frequency and Duration min 3x week  2 weeks      SLP Evaluation Cognition  Overall Cognitive Status: Impaired/Different from baseline Arousal/Alertness: Awake/alert Orientation Level: Disoriented to situation;Disoriented to time;Disoriented to place Attention: Focused Focused Attention: Appears intact Behaviors: Restless;Perseveration       Comprehension  Auditory Comprehension Overall Auditory Comprehension: Impaired Yes/No Questions: Impaired Basic Biographical Questions: 26-50% accurate Commands: Impaired One Step Basic Commands: 0-24% accurate Conversation: Simple EffectiveTechniques: Extra processing time;Slowed speech Visual Recognition/Discrimination Discrimination: Exceptions to [facility] Common Objects:  (unable to select in field of two) Reading Comprehension Reading Status: Not tested    Expression Expression Primary Mode of  Expression: Verbal Verbal Expression Overall Verbal Expression: Impaired Automatic Speech:  (max cueing - can approximate some C-V words) Level of Generative/Spontaneous Verbalization: Word Repetition: Impaired Level of Impairment: Word level Naming: Impairment Responsive: 0-25% accurate Confrontation: Impaired Common Objects:  (unable to select in field of two) Convergent: Not tested Divergent: Not tested Verbal Errors: Perseveration;Phonemic paraphasias Written Expression Dominant Hand: Left Written Expression: Not tested   Oral / Motor  Oral Motor/Sensory Function Overall Oral Motor/Sensory Function: Mild impairment Facial Symmetry: Abnormal symmetry right Facial Strength: Reduced right Lingual Symmetry: Within Functional Limits Motor Speech Overall Motor Speech: Impaired Respiration: Within functional limits Phonation: Low vocal intensity Articulation: Impaired Level of Impairment: Word Intelligibility: Intelligibility reduced Word: 0-24% accurate Motor Planning: Impaired Level of Impairment: Word Motor Speech Errors: Groping for words;Inconsistent   GO                   Zarinah Oviatt L. Tivis Ringer, [place] CCC/SLP Pager [phone]  [name] 06/22/2017, 3:29 PM

## 2017-06-23 ENCOUNTER — Inpatient Hospital Stay (HOSPITAL_COMMUNITY): Payer: Medicare Other

## 2017-06-23 ENCOUNTER — Inpatient Hospital Stay (HOSPITAL_COMMUNITY): Payer: Medicare Other | Admitting: Certified Registered"

## 2017-06-23 ENCOUNTER — Encounter (HOSPITAL_COMMUNITY): Payer: Self-pay | Admitting: Orthopedic Surgery

## 2017-06-23 ENCOUNTER — Ambulatory Visit: Payer: Medicare Other | Admitting: Podiatry

## 2017-06-23 ENCOUNTER — Encounter (HOSPITAL_COMMUNITY)
Admission: EM | Disposition: A | Discharge status: Skilled Nursing Facility | Payer: Self-pay | Source: Home / Self Care | Attending: Neurology

## 2017-06-23 DIAGNOSIS — I724 Aneurysm of artery of lower extremity: Secondary | ICD-10-CM

## 2017-06-23 DIAGNOSIS — I361 Nonrheumatic tricuspid (valve) insufficiency: Secondary | ICD-10-CM

## 2017-06-23 DIAGNOSIS — T148XXA Other injury of unspecified body region, initial encounter: Secondary | ICD-10-CM

## 2017-06-23 DIAGNOSIS — R5381 Other malaise: Secondary | ICD-10-CM

## 2017-06-23 DIAGNOSIS — I97621 Postprocedural hematoma of a circulatory system organ or structure following other procedure: Secondary | ICD-10-CM

## 2017-06-23 DIAGNOSIS — R1310 Dysphagia, unspecified: Secondary | ICD-10-CM

## 2017-06-23 HISTORY — PX: IR US GUIDE BX ASP/DRAIN: IMG2392

## 2017-06-23 HISTORY — PX: FEMORAL ARTERY EXPLORATION: SHX5160

## 2017-06-23 LAB — POCT I-STAT 7, (LYTES, BLD GAS, ICA,H+H)
Acid-base deficit: 6 mmol/L — ABNORMAL HIGH (ref 0.0–2.0)
Acid-base deficit: 5 mmol/L — ABNORMAL HIGH (ref 0.0–2.0)
Bicarbonate: 18.8 mmol/L — ABNORMAL LOW (ref 20.0–28.0)
Bicarbonate: 20.5 mmol/L (ref 20.0–28.0)
Calcium, Ion: 1.28 mmol/L (ref 1.15–1.40)
Calcium, Ion: 1.29 mmol/L (ref 1.15–1.40)
HCT: 18 % — ABNORMAL LOW (ref 36.0–46.0)
HEMATOCRIT: 23 % — AB (ref 36.0–46.0)
HEMOGLOBIN: 6.1 g/dL — AB (ref 12.0–15.0)
HEMOGLOBIN: 7.8 g/dL — AB (ref 12.0–15.0)
O2 Saturation: 97 %
O2 Saturation: 100 %
PCO2 ART: 32 mmHg (ref 32.0–48.0)
PCO2 ART: 36.8 mmHg (ref 32.0–48.0)
PO2 ART: 337 mmHg — AB (ref 83.0–108.0)
POTASSIUM: 3.8 mmol/L (ref 3.5–5.1)
POTASSIUM: 4 mmol/L (ref 3.5–5.1)
Sodium: 139 mmol/L (ref 135–145)
Sodium: 141 mmol/L (ref 135–145)
TCO2: 20 mmol/L — ABNORMAL LOW (ref 22–32)
TCO2: 22 mmol/L (ref 22–32)
pH, Arterial: 7.378 (ref 7.350–7.450)
pH, Arterial: 7.353 (ref 7.350–7.450)
pO2, Arterial: 94 mmHg (ref 83.0–108.0)

## 2017-06-23 LAB — PREPARE RBC (CROSSMATCH)

## 2017-06-23 LAB — GLUCOSE, CAPILLARY
GLUCOSE-CAPILLARY: 102 mg/dL — AB (ref 65–99)
GLUCOSE-CAPILLARY: 118 mg/dL — AB (ref 65–99)
GLUCOSE-CAPILLARY: 93 mg/dL (ref 65–99)
GLUCOSE-CAPILLARY: 98 mg/dL (ref 65–99)
Glucose-Capillary: 76 mg/dL (ref 65–99)
Glucose-Capillary: 96 mg/dL (ref 65–99)

## 2017-06-23 LAB — ECHOCARDIOGRAM COMPLETE
Height: 68 in
WEIGHTICAEL: 3393.32 [oz_av]

## 2017-06-23 LAB — CBC
HCT: 28.2 % — ABNORMAL LOW (ref 36.0–46.0)
HCT: 29.9 % — ABNORMAL LOW (ref 36.0–46.0)
Hemoglobin: 9.1 g/dL — ABNORMAL LOW (ref 12.0–15.0)
Hemoglobin: 9.4 g/dL — ABNORMAL LOW (ref 12.0–15.0)
MCH: 29.4 pg (ref 26.0–34.0)
MCH: 30.3 pg (ref 26.0–34.0)
MCHC: 31.4 g/dL (ref 30.0–36.0)
MCHC: 32.3 g/dL (ref 30.0–36.0)
MCV: 91 fL (ref 78.0–100.0)
MCV: 96.5 fL (ref 78.0–100.0)
PLATELETS: 108 10*3/uL — AB (ref 150–400)
PLATELETS: 93 10*3/uL — AB (ref 150–400)
RBC: 3.1 MIL/uL — AB (ref 3.87–5.11)
RBC: 3.1 MIL/uL — ABNORMAL LOW (ref 3.87–5.11)
RDW: 14.2 % (ref 11.5–15.5)
RDW: 15.1 % (ref 11.5–15.5)
WBC: 7.8 10*3/uL (ref 4.0–10.5)
WBC: 8.8 10*3/uL (ref 4.0–10.5)

## 2017-06-23 LAB — BASIC METABOLIC PANEL
ANION GAP: 5 (ref 5–15)
BUN: 15 mg/dL (ref 6–20)
CALCIUM: 8.8 mg/dL — AB (ref 8.9–10.3)
CO2: 23 mmol/L (ref 22–32)
Chloride: 111 mmol/L (ref 101–111)
Creatinine, Ser: 0.65 mg/dL (ref 0.44–1.00)
GLUCOSE: 126 mg/dL — AB (ref 65–99)
Potassium: 4.1 mmol/L (ref 3.5–5.1)
Sodium: 139 mmol/L (ref 135–145)

## 2017-06-23 LAB — PHOSPHORUS: PHOSPHORUS: 2.5 mg/dL (ref 2.5–4.6)

## 2017-06-23 LAB — MAGNESIUM: MAGNESIUM: 2 mg/dL (ref 1.7–2.4)

## 2017-06-23 SURGERY — EXPLORATION, ARTERY, FEMORAL
Anesthesia: General | Site: Groin | Laterality: Right

## 2017-06-23 MED ORDER — LACTATED RINGERS IV SOLN
INTRAVENOUS | Status: DC
  Administered 2017-06-23: 18:00:00 via INTRAVENOUS

## 2017-06-23 MED ORDER — SUCCINYLCHOLINE CHLORIDE 20 MG/ML IJ SOLN
INTRAMUSCULAR | Status: DC | PRN
  Administered 2017-06-23: 80 mg via INTRAVENOUS

## 2017-06-23 MED ORDER — SUCCINYLCHOLINE CHLORIDE 200 MG/10ML IV SOSY
PREFILLED_SYRINGE | INTRAVENOUS | Status: AC
  Filled 2017-06-23: qty 10

## 2017-06-23 MED ORDER — DEXMEDETOMIDINE HCL IN NACL 400 MCG/100ML IV SOLN
INTRAVENOUS | Status: DC | PRN
  Administered 2017-06-23: .4 ug/kg/h via INTRAVENOUS

## 2017-06-23 MED ORDER — LORAZEPAM 2 MG/ML IJ SOLN
INTRAMUSCULAR | Status: AC
  Filled 2017-06-23: qty 1

## 2017-06-23 MED ORDER — PROPOFOL 10 MG/ML IV BOLUS
INTRAVENOUS | Status: AC
  Filled 2017-06-23: qty 20

## 2017-06-23 MED ORDER — CHLORHEXIDINE GLUCONATE 0.12 % MT SOLN
15.0000 mL | Freq: Two times a day (BID) | OROMUCOSAL | Status: DC
  Administered 2017-06-23 (×2): 15 mL via OROMUCOSAL
  Filled 2017-06-23: qty 15

## 2017-06-23 MED ORDER — FENTANYL 2500MCG IN NS 250ML (10MCG/ML) PREMIX INFUSION
0.0000 ug/h | INTRAVENOUS | Status: DC
  Administered 2017-06-23: 50 ug/h via INTRAVENOUS
  Filled 2017-06-23: qty 250

## 2017-06-23 MED ORDER — FENTANYL CITRATE (PF) 100 MCG/2ML IJ SOLN
INTRAMUSCULAR | Status: DC | PRN
  Administered 2017-06-23 (×2): 100 ug via INTRAVENOUS
  Administered 2017-06-23: 150 ug via INTRAVENOUS
  Administered 2017-06-23: 50 ug via INTRAVENOUS

## 2017-06-23 MED ORDER — DEXMEDETOMIDINE HCL IN NACL 200 MCG/50ML IV SOLN
INTRAVENOUS | Status: AC
  Filled 2017-06-23: qty 50

## 2017-06-23 MED ORDER — 0.9 % SODIUM CHLORIDE (POUR BTL) OPTIME
TOPICAL | Status: DC | PRN
  Administered 2017-06-23: 2000 mL

## 2017-06-23 MED ORDER — FENTANYL CITRATE (PF) 250 MCG/5ML IJ SOLN
INTRAMUSCULAR | Status: AC
  Filled 2017-06-23: qty 5

## 2017-06-23 MED ORDER — LIDOCAINE 2% (20 MG/ML) 5 ML SYRINGE
INTRAMUSCULAR | Status: AC
  Filled 2017-06-23: qty 5

## 2017-06-23 MED ORDER — SODIUM CHLORIDE 0.9 % IV SOLN: Freq: Once | INTRAVENOUS | Status: AC

## 2017-06-23 MED ORDER — SODIUM CHLORIDE 0.9 % IV SOLN
INTRAVENOUS | Status: DC | PRN
  Administered 2017-06-23: 18:00:00

## 2017-06-23 MED ORDER — PROPOFOL 10 MG/ML IV BOLUS
INTRAVENOUS | Status: DC | PRN
  Administered 2017-06-23: 100 mg via INTRAVENOUS

## 2017-06-23 MED ORDER — ROCURONIUM BROMIDE 10 MG/ML (PF) SYRINGE
PREFILLED_SYRINGE | INTRAVENOUS | Status: AC
  Filled 2017-06-23: qty 5

## 2017-06-23 MED ORDER — LIDOCAINE HCL (CARDIAC) 20 MG/ML IV SOLN
INTRAVENOUS | Status: DC | PRN
  Administered 2017-06-23: 40 mg via INTRAVENOUS

## 2017-06-23 MED ORDER — PHENYLEPHRINE HCL 10 MG/ML IJ SOLN
INTRAMUSCULAR | Status: DC | PRN
  Administered 2017-06-23: 50 ug/min via INTRAVENOUS

## 2017-06-23 MED ORDER — EPHEDRINE 5 MG/ML INJ
INTRAVENOUS | Status: AC
  Filled 2017-06-23: qty 10

## 2017-06-23 MED ORDER — ORAL CARE MOUTH RINSE
15.0000 mL | OROMUCOSAL | Status: DC
  Administered 2017-06-24 – 2017-06-25 (×18): 15 mL via OROMUCOSAL

## 2017-06-23 MED ORDER — LORAZEPAM 2 MG/ML IJ SOLN
0.5000 mg | INTRAMUSCULAR | Status: DC | PRN
  Administered 2017-06-23: 1 mg via INTRAVENOUS
  Filled 2017-06-23: qty 1

## 2017-06-23 MED ORDER — THROMBIN 5000 UNITS EX SOLR
5000.0000 [IU] | CUTANEOUS | Status: DC
  Filled 2017-06-23: qty 5000

## 2017-06-23 MED ORDER — LACTATED RINGERS IV SOLN
INTRAVENOUS | Status: DC | PRN
  Administered 2017-06-23: 18:00:00 via INTRAVENOUS

## 2017-06-23 MED ORDER — ALBUMIN HUMAN 5 % IV SOLN
INTRAVENOUS | Status: DC | PRN
  Administered 2017-06-23 (×2): via INTRAVENOUS

## 2017-06-23 MED ORDER — CALCIUM CHLORIDE 10 % IV SOLN
INTRAVENOUS | Status: AC
  Filled 2017-06-23: qty 10

## 2017-06-23 MED ORDER — CHLORHEXIDINE GLUCONATE 0.12% ORAL RINSE (MEDLINE KIT)
15.0000 mL | Freq: Two times a day (BID) | OROMUCOSAL | Status: DC
  Administered 2017-06-24 – 2017-06-25 (×3): 15 mL via OROMUCOSAL

## 2017-06-23 MED ORDER — ORAL CARE MOUTH RINSE
15.0000 mL | Freq: Two times a day (BID) | OROMUCOSAL | Status: DC
  Administered 2017-06-23: 15 mL via OROMUCOSAL

## 2017-06-23 MED ORDER — SODIUM CHLORIDE 0.9 % IV SOLN
0.0000 ug/min | INTRAVENOUS | Status: DC
  Filled 2017-06-23: qty 1

## 2017-06-23 MED ORDER — PHENYLEPHRINE 40 MCG/ML (10ML) SYRINGE FOR IV PUSH (FOR BLOOD PRESSURE SUPPORT)
PREFILLED_SYRINGE | INTRAVENOUS | Status: AC
  Filled 2017-06-23: qty 10

## 2017-06-23 MED ORDER — LIDOCAINE HCL (PF) 1 % IJ SOLN
INTRAMUSCULAR | Status: AC
  Filled 2017-06-23: qty 30

## 2017-06-23 MED ORDER — ROCURONIUM BROMIDE 100 MG/10ML IV SOLN
INTRAVENOUS | Status: DC | PRN
  Administered 2017-06-23 (×2): 50 mg via INTRAVENOUS

## 2017-06-23 MED ORDER — CALCIUM CHLORIDE 10 % IV SOLN
INTRAVENOUS | Status: DC | PRN
  Administered 2017-06-23: 200 mg via INTRAVENOUS

## 2017-06-23 SURGICAL SUPPLY — 60 items
ADH SKN CLS APL DERMABOND .7 (GAUZE/BANDAGES/DRESSINGS)
BANDAGE ESMARK 6X9 LF (GAUZE/BANDAGES/DRESSINGS) IMPLANT
BNDG CMPR 9X6 STRL LF SNTH (GAUZE/BANDAGES/DRESSINGS)
BNDG ESMARK 6X9 LF (GAUZE/BANDAGES/DRESSINGS)
CANISTER SUCT 3000ML PPV (MISCELLANEOUS) ×2 IMPLANT
CANNULA VESSEL 3MM 2 BLNT TIP (CANNULA) ×2 IMPLANT
CLIP VESOCCLUDE MED 24/CT (CLIP) ×2 IMPLANT
CLIP VESOCCLUDE SM WIDE 24/CT (CLIP) ×2 IMPLANT
CUFF TOURNIQUET SINGLE 24IN (TOURNIQUET CUFF) IMPLANT
CUFF TOURNIQUET SINGLE 34IN LL (TOURNIQUET CUFF) IMPLANT
CUFF TOURNIQUET SINGLE 44IN (TOURNIQUET CUFF) IMPLANT
DECANTER SPIKE VIAL GLASS SM (MISCELLANEOUS) IMPLANT
DERMABOND ADVANCED (GAUZE/BANDAGES/DRESSINGS)
DERMABOND ADVANCED .7 DNX12 (GAUZE/BANDAGES/DRESSINGS) IMPLANT
DRAIN HEMOVAC 1/8 X 5 (WOUND CARE) ×2 IMPLANT
DRAIN SNY WOU (WOUND CARE) IMPLANT
DRAPE X-RAY CASS 24X20 (DRAPES) IMPLANT
DRSG COVADERM 4X6 (GAUZE/BANDAGES/DRESSINGS) ×2 IMPLANT
ELECT REM PT RETURN 9FT ADLT (ELECTROSURGICAL) ×2
ELECTRODE REM PT RTRN 9FT ADLT (ELECTROSURGICAL) ×1 IMPLANT
EVACUATOR SILICONE 100CC (DRAIN) ×2 IMPLANT
GAUZE SPONGE 4X4 12PLY STRL (GAUZE/BANDAGES/DRESSINGS) ×1 IMPLANT
GLOVE BIO SURGEON STRL SZ7.5 (GLOVE) ×2 IMPLANT
GLOVE BIOGEL PI IND STRL 6.5 (GLOVE) ×2 IMPLANT
GLOVE BIOGEL PI IND STRL 7.0 (GLOVE) IMPLANT
GLOVE BIOGEL PI INDICATOR 6.5 (GLOVE) ×2
GLOVE BIOGEL PI INDICATOR 7.0 (GLOVE) ×1
GLOVE SURG SS PI 6.0 STRL IVOR (GLOVE) ×2 IMPLANT
GLOVE SURG SS PI 6.5 STRL IVOR (GLOVE) ×1 IMPLANT
GLOVE SURG SS PI 7.5 STRL IVOR (GLOVE) ×2 IMPLANT
GOWN STRL REUS W/ TWL LRG LVL3 (GOWN DISPOSABLE) ×3 IMPLANT
GOWN STRL REUS W/TWL LRG LVL3 (GOWN DISPOSABLE) ×6
KIT BASIN OR (CUSTOM PROCEDURE TRAY) ×2 IMPLANT
KIT ROOM TURNOVER OR (KITS) ×2 IMPLANT
NS IRRIG 1000ML POUR BTL (IV SOLUTION) ×4 IMPLANT
PACK PERIPHERAL VASCULAR (CUSTOM PROCEDURE TRAY) ×2 IMPLANT
PAD ARMBOARD 7.5X6 YLW CONV (MISCELLANEOUS) ×4 IMPLANT
SET COLLECT BLD 21X3/4 12 (NEEDLE) IMPLANT
SPONGE INTESTINAL PEANUT (DISPOSABLE) ×1 IMPLANT
SPONGE SURGIFOAM ABS GEL 100 (HEMOSTASIS) IMPLANT
STAPLER VISISTAT 35W (STAPLE) ×1 IMPLANT
STOPCOCK 4 WAY LG BORE MALE ST (IV SETS) IMPLANT
SUT ETHILON 3 0 PS 1 (SUTURE) ×3 IMPLANT
SUT PROLENE 5 0 C 1 24 (SUTURE) ×7 IMPLANT
SUT PROLENE 6 0 CC (SUTURE) ×4 IMPLANT
SUT PROLENE 7 0 BV 1 (SUTURE) IMPLANT
SUT PROLENE 7 0 BV1 MDA (SUTURE) IMPLANT
SUT SILK 2 0 SH (SUTURE) ×2 IMPLANT
SUT SILK 3 0 (SUTURE)
SUT SILK 3-0 18XBRD TIE 12 (SUTURE) IMPLANT
SUT VIC AB 2-0 CTX 36 (SUTURE) ×3 IMPLANT
SUT VIC AB 3-0 SH 27 (SUTURE) ×8
SUT VIC AB 3-0 SH 27X BRD (SUTURE) ×2 IMPLANT
TAPE CLOTH SURG 4X10 WHT LF (GAUZE/BANDAGES/DRESSINGS) ×2 IMPLANT
TAPE UMBILICAL COTTON 1/8X30 (MISCELLANEOUS) IMPLANT
TRAY FOLEY W/METER SILVER 16FR (SET/KITS/TRAYS/PACK) ×2 IMPLANT
TUBING EXTENTION W/L.L. (IV SETS) IMPLANT
UNDERPAD 30X30 (UNDERPADS AND DIAPERS) ×2 IMPLANT
WATER STERILE IRR 1000ML POUR (IV SOLUTION) ×2 IMPLANT
YANKAUER SUCT BULB TIP NO VENT (SUCTIONS) ×1 IMPLANT

## 2017-06-23 NOTE — Anesthesia Procedure Notes (Signed)
Central Venous Catheter Insertion Performed by: Albertha Ghee, anesthesiologist Start/End9/02/2017 7:45 PM, 06/23/2017 7:54 PM Patient location: Pre-op. Preanesthetic checklist: patient identified, IV checked, site marked, risks and benefits discussed, surgical consent, monitors and equipment checked, pre-op evaluation, timeout performed and anesthesia consent Position: Trendelenburg Patient sedated Hand hygiene performed , maximum sterile barriers used  and Seldinger technique used Catheter size: 7 Fr Central line was placed.Double lumen Procedure performed using ultrasound guided (unable to print image) technique. Ultrasound Notes:anatomy identified, needle tip was noted to be adjacent to the nerve/plexus identified and no ultrasound evidence of intravascular and/or intraneural injection Attempts: 1 Following insertion, line sutured, dressing applied and Biopatch. Post procedure assessment: blood return through all ports, free fluid flow and no air  Patient tolerated the procedure well with no immediate complications. Additional procedure comments: Pt was under general anesthesia during procedure.Marland Kitchen

## 2017-06-23 NOTE — Anesthesia Postprocedure Evaluation (Signed)
Anesthesia Post Note  Patient: Melinda Noble  Procedure(s) Performed: Procedure(s) (LRB): repair right femoral pseudoanerysm; evacuation hemotoma (Right)     Patient location during evaluation: SICU Anesthesia Type: General Level of consciousness: sedated Pain management: pain level controlled Vital Signs Assessment: post-procedure vital signs reviewed and stable Respiratory status: patient remains intubated per anesthesia plan Cardiovascular status: stable Anesthetic complications: no    Last Vitals:  Vitals:   06/23/17 1600 06/23/17 2150  BP: (!) 142/84   Pulse: 77 (!) 55  Resp: (!) 26 14  Temp:  36.7 C  SpO2: 100% 100%    Last Pain:  Vitals:   06/23/17 2150  TempSrc: Oral                 Akela Pocius S

## 2017-06-23 NOTE — Anesthesia Procedure Notes (Signed)
Performed by: Eligha Bridegroom

## 2017-06-23 NOTE — Progress Notes (Signed)
Notified IR about vascular ultrasound showing positive pseudoaneurysm from vascular tech.  IR MD would like to keep pt in ICU until this is address.  Transfer order cancel at this time.  Pt is currently increasingly restless.  Neurology notified and new orders received.  Will continue to monitor pt.

## 2017-06-23 NOTE — Progress Notes (Signed)
Received order for Lower extremity saphenous vein mapping. Upon reading indication of r/o pseudoaneurysm, order was corrected. Vascular lab Landry Mellow, RDMS, RVT

## 2017-06-23 NOTE — Progress Notes (Signed)
eLink Physician-Brief Progress Note Patient Name: Melinda Noble DOB: 1930-08-22 MRN: [id number]   Date of Service  06/23/2017  HPI/Events of Note  Multiple issues: 1. Hypotension and bradycardia - patient is currently on a Precedex IV infusion.   eICU Interventions  Will order: 1. D/C Precedex IV infusion. 2. Fentanyl IV infusion. Titrate to RASS = 0. 3. Monitor CVP. 4. Phenylephrine IV infusion. Titrate to MAP >= 65.      Intervention Category Major Interventions: Hypotension - evaluation and management  Sommer,Steven Eugene 06/23/2017, 10:49 PM

## 2017-06-23 NOTE — Consult Note (Signed)
Referring Physician: Dr Estanislado Pandy  Patient name: Melinda Noble MRN: [id number] DOB: 1930-10-08 Sex: female  REASON FOR CONSULT: right groin hematoma with femoral pseudoaneurysm  HPI: Melinda Noble is a 83 y.o. female s/p neuro intervention 9/2, 9 Fr sheath.  After sheath was removed she developed a delayed hematoma and then pseudoaneurysm.  Plan was to inject this today but neck was too wide and hematoma had expanded causing skin compromise.  Other medical problems include recent CVA with aphasia and weakness.  She is currently sedated with Ativan.  Past Medical History:  Diagnosis Date  . Anemia   . CAD (coronary artery disease)    a. STEMI 10/09/13 - 100% LAD - following initial angioplasty, ? Spontaneous dissection vs related to ballon. s/p complex PCI with 2 overlapping DES to LAD. Adm complicated by VDRF, cardiogenic shock, peri-MI PAF with NSR on amiodarone. Residual mod RCA disease.   Marland Kitchen CAD S/P percutaneous coronary angioplasty 10/09/13   DES x 2 to Mid LAD, 2.5 x 38, 2.5 x 12 Promus (covering extensive disection), moderate RCA disesase  . Cardiogenic shock ([place])    a. 09/2013 - during admission for STEMI.  . Depression   . Dyslipidemia   . H/O Acute respiratory failure due to hypoxia 10/10/2013   a. 09/2013 - secondary to anterior STEMI related pulmonary edema (resolved).  . Hypertension   . Hypothyroidism   . Ischemic cardiomyopathy    a. EF 30-35% during 09/2013 adm for STEMI. b. Repeat Echo EF 40-45% with distal LAD hypokinesis (10/16/14 following STEMI recovery).  . Migraine headache    a. Remotely.  . Orthostatic hypotension    a. Adm 10/2013 for this, felt due to Lasix. Not on regular diuretic due to this/dehydration.  Marland Kitchen PAF (paroxysmal atrial fibrillation) ([place])    a. 09/2013 - peri-MI, NSR at discharge on amiodarone.  . Sinus bradycardia    Past Surgical History:  Procedure Laterality Date  . CORONARY ANGIOPLASTY WITH STENT PLACEMENT  10/09/13   2 overlapping  Promus DES to mid LAD, occlusion/dissection (2.5 x 38, 2.5 x 12)  . LEFT HEART CATHETERIZATION WITH CORONARY ANGIOGRAM N/A 10/09/2013   Procedure: LEFT HEART CATHETERIZATION WITH CORONARY ANGIOGRAM;  Surgeon: Leonie Man, MD;  Location: [facility] CATH LAB;  Service: Cardiovascular;  Laterality: N/A;  . RADIOLOGY WITH ANESTHESIA N/A 06/20/2017   Procedure: RADIOLOGY WITH ANESTHESIA;  Surgeon: Luanne Bras, MD;  Location: [place];  Service: Radiology;  Laterality: N/A;  . SHOULDER SURGERY      Family History  Problem Relation Age of Onset  . Varicose Veins Father   . Heart attack Father   . Heart failure Mother   . Anemia Mother     SOCIAL HISTORY: Social History   Social History  . Marital status: Married    Spouse name: N/A  . Number of children: N/A  . Years of education: N/A   Occupational History  . Not on file.   Social History Main Topics  . Smoking status: Former Research scientist (life sciences)  . Smokeless tobacco: Never Used  . Alcohol use No  . Drug use: No  . Sexual activity: Not on file   Other Topics Concern  . Not on file   Social History Narrative   Patient is Married, since 1958 [name])   Lives in apartment, Independent Living section at [facility] since 04/2013.   Stopped Smoking many years ago, minimal aalcohol history.   Regular exercise includes swimming 3d/week,   Patient has  no Advanced planning documents    Allergies  Allergen Reactions  . Ace Inhibitors Anaphylaxis  . Beta Adrenergic Blockers Anaphylaxis  . Iohexol Anaphylaxis     Code: HIVES, Desc: anaphylactic shock s/p contrast injection many yrs ago--suggested that pt NEVER have iv contrast//a.c., Onset Date: [id number]   . Atorvastatin Rash  . Latex Rash  . Penicillins Swelling and Rash    Details are unknown  . Sulfa Antibiotics Rash    Current Facility-Administered Medications  Medication Dose Route Frequency Provider Last Rate Last Dose  . 0.9 %  sodium chloride infusion    Intravenous Continuous Luanne Bras, MD 75 mL/hr at 06/23/17 0800    . acetaminophen (TYLENOL) tablet 650 mg  650 mg Oral Q4H PRN Greta Doom, MD       Or  . acetaminophen (TYLENOL) solution 650 mg  650 mg Per Tube Q4H PRN Greta Doom, MD       Or  . acetaminophen (TYLENOL) suppository 650 mg  650 mg Rectal Q4H PRN Greta Doom, MD      . chlorhexidine (PERIDEX) 0.12 % solution 15 mL  15 mL Mouth Rinse BID Garvin Fila, MD   15 mL at 06/23/17 0935  . chlorhexidine gluconate (MEDLINE KIT) (PERIDEX) 0.12 % solution 15 mL  15 mL Mouth Rinse BID Simonne Maffucci B, MD   15 mL at 06/22/17 2000  . famotidine (PEPCID) IVPB 20 mg premix  20 mg Intravenous Q12H Juanito Doom, MD   Stopped at 06/23/17 1005  . levothyroxine (SYNTHROID, LEVOTHROID) injection 50 mcg  50 mcg Intravenous Daily Rush Farmer, MD   50 mcg at 06/23/17 0935  . lidocaine (PF) (XYLOCAINE) 1 % injection           . LORazepam (ATIVAN) 2 MG/ML injection           . LORazepam (ATIVAN) injection 0.5-1 mg  0.5-1 mg Intravenous Q2H PRN Balinda Quails A, NP   1 mg at 06/23/17 1426  . MEDLINE mouth rinse  15 mL Mouth Rinse q12n4p Garvin Fila, MD   15 mL at 06/23/17 1250  . ondansetron (ZOFRAN) injection 4 mg  4 mg Intravenous Q6H PRN Deveshwar, Sanjeev, MD      . pantoprazole (PROTONIX) injection 40 mg  40 mg Intravenous QHS Greta Doom, MD   40 mg at 06/22/17 2206  . sertraline (ZOLOFT) tablet 100 mg  100 mg Per Tube Daily Greta Doom, MD   Stopped at 06/21/17 1000  . thrombin spray 5,000 Units  5,000 Units Topical to Wayne Sever, DO       Facility-Administered Medications Ordered in Other Encounters  Medication Dose Route Frequency Provider Last Rate Last Dose  . etomidate (AMIDATE) injection    Anesthesia Intra-op Fulton Reek, MD   14 mg at 10/10/13 0105  . rocuronium [facility]) injection    Anesthesia Intra-op Fulton Reek, MD   50 mg at  10/10/13 0110  . succinylcholine (ANECTINE) injection    Anesthesia Intra-op Fulton Reek, MD   100 mg at 10/10/13 0105    ROS:   Unable to obtain pt is sedated  Physical Examination  Vitals:   06/23/17 1300 06/23/17 1400 06/23/17 1500 06/23/17 1600  BP: (!) 165/125 (!) 184/134 (!) 108/91 (!) 142/84  Pulse: 89 80 72 77  Resp: 17 (!) 27 (!) 23 (!) 26  Temp:      TempSrc:      SpO2: 97% 100% 99% 100%  Weight:      Height:        Body mass index is 32.25 kg/m.  General:  Sedated does not follow commands HEENT: right orbital ecchymosis Neck: No JVD Pulmonary: Clear to auscultation bilaterally Cardiac: Regular Rate and Rhythm occasional skipped beat Abdomen: Soft, non-tender, non-distended, no mass, obese Skin: No rash, ecchymosis with early right skin slough right groin Extremity Pulses:  Biphasic to triphasic dorsalis pedis, posterior tibial signals bilaterally Musculoskeletal: No deformity or edema  Neurologic: Upper and lower extremity motor not moving or following commands  DATA:  CBC    Component Value Date/Time   WBC 7.8 06/23/2017 0224   RBC 3.10 (L) 06/23/2017 0224   HGB 9.4 (L) 06/23/2017 0224   HCT 29.9 (L) 06/23/2017 0224   PLT 108 (L) 06/23/2017 0224   MCV 96.5 06/23/2017 0224   MCH 30.3 06/23/2017 0224   MCHC 31.4 06/23/2017 0224   RDW 14.2 06/23/2017 0224   LYMPHSABS 1.4 06/21/2017 0349   MONOABS 1.4 (H) 06/21/2017 0349   EOSABS 0.0 06/21/2017 0349   BASOSABS 0.0 06/21/2017 0349    BMET    Component Value Date/Time   NA 139 06/23/2017 0224   NA 140 01/26/2017 0855   K 4.1 06/23/2017 0224   CL 111 06/23/2017 0224   CO2 23 06/23/2017 0224   GLUCOSE 126 (H) 06/23/2017 0224   BUN 15 06/23/2017 0224   BUN 17 01/26/2017 0855   CREATININE 0.65 06/23/2017 0224   CALCIUM 8.8 (L) 06/23/2017 0224   GFRNONAA >60 06/23/2017 0224   GFRAA >60 06/23/2017 0224     ASSESSMENT:  Right femoral pseudoaneurysm not amenable to injection now with skin  compromise from hematoma   PLAN:  To OR for repair.  Risk benefits complications including but not limited to bleeding infection wound healing issues explained to son who consents for procedure.   Ruta Hinds, MD [facility] Office: [phone] Pager: [phone]

## 2017-06-23 NOTE — Anesthesia Preprocedure Evaluation (Signed)
Anesthesia Evaluation  Patient identified by MRN, date of birth, ID band Patient unresponsive  General Assessment Comment:Pt lethargic.  She was apparently given Ativan prior to arrival.  SaO2 in 70s at times while on FM.  Reviewed: Unable to perform ROS - Chart review only  Airway Mallampati: II  TM Distance: >3 FB    Comment: uncooperative Dental   Pulmonary former smoker,    breath sounds clear to auscultation (-) decreased breath sounds      Cardiovascular hypertension, + CAD, + Past MI, + Cardiac Stents and +CHF   Rhythm:Regular Rate:Normal     Neuro/Psych  Headaches, PSYCHIATRIC DISORDERS Depression CVA    GI/Hepatic   Endo/Other  Hypothyroidism   Renal/GU      Musculoskeletal   Abdominal   Peds  Hematology  (+) anemia ,   Anesthesia Other Findings   Reproductive/Obstetrics                             Anesthesia Physical  Anesthesia Plan  ASA: IV  Anesthesia Plan: General   Post-op Pain Management:    Induction: Intravenous, Rapid sequence and Cricoid pressure planned  PONV Risk Score and Plan: 3 and Ondansetron, Dexamethasone, Propofol infusion and Treatment may vary due to age or medical condition  Airway Management Planned: Oral ETT  Additional Equipment:   Intra-op Plan:   Post-operative Plan: Post-operative intubation/ventilation  Informed Consent: I have reviewed the patients History and Physical, chart, labs and discussed the procedure including the risks, benefits and alternatives for the proposed anesthesia with the patient or authorized representative who has indicated his/her understanding and acceptance.     Plan Discussed with: CRNA, Anesthesiologist and Surgeon  Anesthesia Plan Comments:         Anesthesia Quick Evaluation

## 2017-06-23 NOTE — Anesthesia Procedure Notes (Signed)
Procedure Name: Intubation Date/Time: 06/23/2017 6:43 PM Performed by: Eligha Bridegroom Pre-anesthesia Checklist: Patient identified, Emergency Drugs available, Suction available, Patient being monitored and Timeout performed Patient Re-evaluated:Patient Re-evaluated prior to induction Oxygen Delivery Method: Circle system utilized Preoxygenation: Pre-oxygenation with 100% oxygen Induction Type: IV induction and Rapid sequence Laryngoscope Size: Mac and 3 Grade View: Grade II Tube type: Oral Tube size: 7.0 mm Number of attempts: 1 Airway Equipment and Method: Stylet Placement Confirmation: ETT inserted through vocal cords under direct vision,  positive ETCO2 and breath sounds checked- equal and bilateral Secured at: 21 cm Tube secured with: Tape Dental Injury: Teeth and Oropharynx as per pre-operative assessment

## 2017-06-23 NOTE — Progress Notes (Signed)
*  PRELIMINARY RESULTS* Vascular Ultrasound Right groin limited arterial duplex has been completed.  Preliminary findings: Positive for pseudoaneurysm of the right groin measuring 4.68 x 4.11 cm with surrounding hematoma. PSA neck measures 0.58 cm and is 1.36 cm long.   Informed patient's RN of positive results. Could not find number to call Brynda Greathouse.   Landry Mellow, RDMS, RVT  06/23/2017, 11:57 AM

## 2017-06-23 NOTE — Op Note (Signed)
Procedure: Repair of right common femoral artery pseudoaneurysm  Preoperative diagnosis: Right common femoral pseudoaneurysm repair postoperative diagnosis: Same  Anesthesia: Gen.  Assistant: Pervis Hocking PA-C  Operative findings: #1 hole in right common femoral artery adjacent to the inguinal ligament repaired with 5-0 Prolene suture #210 flat Jackson-Pratt drain  Operative details: After obtaining informed consent from the patient's son, the patient was taken to the operating. The patient was placed in supine position operating table. After induction of general anesthesia and endotracheal intubation, Foley catheter was placed. Next the patient's right lower abdomen and upper thigh were prepped and draped in usual sterile fashion. A longitudinal incision was made through the patient's right groin and carried onto subcutaneous tissues and a hematoma cavity was entered. There was brisk arterial bleeding from the base of this. Patient was briefly hypotensive. Digital pressure was used to control bleeding until the anesthesia team could fluid resuscitate the patient. At this point dissection was begun below the area of bleeding in the common femoral artery was dissected free circumferentially and a vessel loop placed around this. Dissection was then carried out above the area of the bleeding and the common femoral artery was dissected free circumferentially just below the inguinal ligament. Several circumflex iliac branches were ligated and divided between silk ties. The hole was on the anteromedial side of the artery adjacent to the femoral vein. This was fully skeletonized and the hole repaired with a 5-0 Prolene figure-of-eight suture. Hemostasis was obtained from the surrounding tissues were several venous tributaries were bleeding. The wound was thoroughly irrigated with normal saline solution. A large amount of hematoma was evacuated from a cavity in the subcutaneous tissues. A #10 flat Jackson-Pratt drain  was brought out through separate stab incision on the lateral right thigh. This was sutured to the skin with nylon suture. The deep layers of tissue were closed over the common femoral artery. The drain was then laid on top of this. Several layers of 3-0 running Vicryl suture were closed in the groin over the drain. The skin was then closed with staples. The patient are the procedure well and there were no complications other than the brief episode of hypotension at the beginning and midportion of the case. Patient was taken to the intensive care unit in stable but critical condition.  Ruta Hinds, MD [facility] Office: [phone] Pager: [phone]

## 2017-06-23 NOTE — Progress Notes (Signed)
STROKE TEAM PROGRESS NOTE   HISTORY OF PRESENT ILLNESS (per record)  CC: Right sided weakness  History is obtained from:patient  HPI: Melinda Noble is a 83 y.o. female with a history of afib, not on anti-coagulants who presents with right sided weakness. She was initially plegic on the right, but by the time of arrival to the ED, was having significant improvement.  Her husband states that he spoke with her while he was getting out of bed and she was normal, she then was up getting ready when he heard her fall in the bathroom, and found her on the floor with a large R periorbital hematoma, right-sided weakness, and slurred speech.  Initial NIHSS 8.  Administered tPA bolus at 0941 on 06/20/2017. Stat MRI with high-grade stenosis or occlusion of the distal left M1 segment with nonvisualization of distal MCA branch vessels, became dyspneic, and was taken emergently to IR and intubated.  Per IR, "[w]ith x 2 passes with solitaire 64mm x 40 mm FR retriever device achieved a TICI 2 b reperfusion".      LKW: 6:30 am on 06/21/2017 tpa given?: yes Modified Rankin Score: 0 VAN: positive   Patient was administered IV t-PA at 0941 ib 06/20/2017. She was admitted to the neuro ICU for further evaluation and treatment.   SUBJECTIVE (INTERVAL HISTORY) Her  Husband and  daughter are  at the bedside.  The patient   remains aphasic but is able to move right side and follow simple commands OBJECTIVE Temp:  [97.6 F (36.4 C)-98.4 F (36.9 C)] 98.4 F (36.9 C) (09/05 1200) Pulse Rate:  [64-89] 72 (09/05 1500) Cardiac Rhythm: Heart block (09/05 0400) Resp:  [16-29] 23 (09/05 1500) BP: (107-184)/(48-134) 108/91 (09/05 1500) SpO2:  [87 %-100 %] 99 % (09/05 1500)  CBC:   Recent Labs Lab 06/20/17 0819  06/21/17 0349 06/23/17 0224  WBC 6.7  --  13.8* 7.8  NEUTROABS 4.3  --  11.1*  --   HGB 13.3  < > 11.9* 9.4*  HCT 41.6  < > 37.4 29.9*  MCV 94.5  --  94.7 96.5  PLT 157  --  174 108*  < > = values in this  interval not displayed.  Basic Metabolic Panel:   Recent Labs Lab 06/21/17 0349 06/23/17 0224  NA 140 139  K 3.9 4.1  CL 113* 111  CO2 20* 23  GLUCOSE 115* 126*  BUN 15 15  CREATININE 0.75 0.65  CALCIUM 8.9 8.8*  MG 1.7 2.0  PHOS 2.8 2.5    Lipid Panel:     Component Value Date/Time   CHOL 133 06/21/2017 0349   CHOL 152 01/26/2017 0855   TRIG 121 06/21/2017 0349   HDL 45 06/21/2017 0349   HDL 56 01/26/2017 0855   CHOLHDL 3.0 06/21/2017 0349   VLDL 24 06/21/2017 0349   LDLCALC 64 06/21/2017 0349   LDLCALC 78 01/26/2017 0855   HgbA1c:  Lab Results  Component Value Date   HGBA1C 5.4 06/21/2017   Urine Drug Screen:     Component Value Date/Time   LABOPIA NONE DETECTED 06/20/2017 2201   COCAINSCRNUR NONE DETECTED 06/20/2017 2201   LABBENZ POSITIVE (A) 06/20/2017 2201   AMPHETMU NONE DETECTED 06/20/2017 2201   THCU NONE DETECTED 06/20/2017 2201   LABBARB NONE DETECTED 06/20/2017 2201    Alcohol Level No results found for: [place] Head Code Stroke Wo Contrast 06/20/2017 IMPRESSION: 1. No significant interval change. 2. Stable atrophy and white  matter disease. 3. No definite hyperdense vessel. 4. Stable right periorbital hematoma. 5. ASPECTS is 10/10 These results were called by telephone at the time of interpretation on 06/20/2017 at 10:05 am to Dr. Roland Rack , who verbally acknowledged these results.  Ct Head Code Stroke Wo Contrast 06/20/2017 IMPRESSION: 1. Stable atrophy and white matter disease. 2. No acute intracranial abnormality or significant interval change. 3. Prominent right infraorbital and lateral soft tissue hematoma without underlying fracture or intraorbital extension. 4. ASPECTS is 10/10  Ct Head Wo Contrast 06/20/2017 IMPRESSION: 1. Focal density posteriorly in the left sylvian fissure may reflect contrast associated with a more distal occlusion. 2. No acute hemorrhage. 3. No evidence for progressing infarct. 4. Stable atrophy and  white matter disease. 5. Increasing size of right infraorbital hematoma.   Ct Head Wo Contrast 06/21/2017 IMPRESSION: 1. New left posterolateral temporal lobe acute infarction. 2. Stable infarction centered in left frontal centrum semiovale extending into left superior lentiform nucleus and insula in comparison with prior CT/MRI head. Interval increase in edema and local mass effect associated with the infarct. 3. No acute intracranial hemorrhage. These results will be called to the ordering clinician or representative by the Radiologist Assistant, and communication documented in the PACS or zVision Dashboard.   Ct Maxillofacial Wo Contrast 06/20/2017 IMPRESSION: 1. No maxillofacial fracture. 2. Right infraorbital and periorbital soft tissue hematoma.   [name] Head Wo Contrast 06/20/2017 IMPRESSION: 1. High-grade stenosis or occlusion of the distal left M1 segment with nonvisualization of distal MCA branch vessels. This corresponds with the patient's acute symptoms, likely representing an emergent large vessel occlusion. 2. Narrowing of the right M1 segment and attenuation of MCA branch vessels likely related to atherosclerotic disease. 3. Posterior circulation is intact. 4. Ill-defined area of restricted diffusion within white matter of the left centrum semi ovale may reflect acute ischemia. No definite cortical infarct is present.  Mr Brain Wo Contrast 06/21/2017 IMPRESSION: 1. Increased size of acute/subacute left MCA territory infarct, now involving the left insular cortex, left frontal operculum, posterior left temporal and parietal lobe, and more anterior superior left frontal lobe. 2. No evidence for hemorrhagic conversion of left-sided infarcts. 3. Focal acute nonhemorrhagic infarct in the right parietal lobe and centrum semiovale. Bilateral disease raises concern for a central cardio embolic source. 4. Right periorbital hematoma.   Dg Chest Port 1 View 06/20/2017 IMPRESSION: Endotracheal tube in  good position Cardiomegaly and mild central venous congestion.  Dg Chest Port 1 View 06/20/2017 IMPRESSION: 1. Stable cardiomegaly.  Aortic atherosclerosis with mild uncoiling. 2. Slightly low-lying endotracheal tube at 2.6 cm above the carina. Slight pullback of the of the ETT is recommended by 1 cm. 3. No acute pulmonary disease.   TTE 06/20/2017  Left ventricle: The cavity size was normal. Wall thickness was   normal. Systolic function was mildly reduced. The estimated   ejection fraction was in the range of 45% to 50%. There is   akinesis of the apical myocardium. Doppler parameters are   consistent with abnormal left ventricular relaxation (grade 1   diastolic dysfunction).     PHYSICAL EXAM Elderly Caucasian lady  . Marland Kitchen Afebrile. Head is nontraumatic. Neck is supple without bruit.    Cardiac exam no murmur or gallop. Lungs are clear to auscultation. Distal pulses are well felt. Neurological Exam :  Awake alert. Aphasic and will not follow any commands consistently but will mimic.Left gaze preference. Blinks to threat on the right Pupils irregular reactive. Fundi not visualized. Right  lower facial weakness. Tongue midline. Motor system exam shows right hemiparesis 3/5 strength with trace withdrawal right upper extremity and modest withdrawal in the right lower extremityr Purposeful antigravity spontaneous movements of left side. DTRs depressed on right and normal on left side. Rt Plantar upgoing and left downgoing. ASSESSMENT/PLAN Ms. KIEU QUIGGLE is a 83 y.o. female with history of history of afib, not on anti-coagulants who presents with right sided weakness.  She is status post IV tPA and IR mechanical thrombectomy.  Stroke: acute/subacute L MCA infarct, involving L insular cortex, L frontal operculum, L posterior temporal and parietal lobe, and more anterior L superior frontal lobe, as well as acute infarct R parietal lobe and Rcentrum semiovale, embolic, in the setting of  atheorsclerosis.  Resultant  Aphasia and right hemiplegia  CT head: no acute stroke.  Prominent right infraorbital and lateral soft tissue hematoma without underlying fracture or intraorbital extension  MRI head: acute/subacute L MCA infarct, involving the left insular cortex, left frontal operculum, posterior left temporal and parietal lobe, and more anterior superior left frontal lobe.  Focal acute nonhemorrhagic infarct in the right parietal lobe and centrum semiovale.  MRA head: High-grade L M1 stenosis.  Ill-defined area of restricted diffusion within white matter of the left centrum semi ovale may reflect acute ischemia 2D Echo  Left ventricle: The cavity size was normal. Wall thickness was   normal. Systolic function was mildly reduced. The estimated   ejection fraction was in the range of 45% to 50%. There is   akinesis of the apical myocardium. Doppler parameters are   consistent with abnormal left ventricular relaxation (grade 1    diastolic dysfunction).   LDL 64  HgbA1c 5.4  SCDs for VTE prophylaxis Diet NPO time specified  aspirin 81 mg daily prior to admission, now on aspirin 300 mg suppository daily  Patient counseled to be compliant with her antithrombotic medications  Ongoing aggressive stroke risk factor management  Therapy recommendations:   pending  Disposition:  pending  Hypertension  Unstable  Nicardipine gtt yesterday.  Now off.  Permissive hypertension (OK if < 220/120) but gradually normalize in 5-7 days  Long-term BP goal normotensive  Hyperlipidemia  Home meds: Rosuvastatin 5 mg daily, resumed in hospital  LDL 64, goal < 70  Continue statin at discharge  Other Stroke Risk Factors  Advanced age  Obesity, Body mass index is 32.25 kg/m., recommend weight loss, diet and exercise as appropriate   Coronary artery disease  Other Active Problems  None  Hospital day # 3  I have personally examined this patient, reviewed notes,  independently viewed imaging studies, participated in medical decision making and plan of care.ROS completed by me personally and pertinent positives fully documented  I have made any additions or clarifications directly to the above note. She presented with left M1 occlusion and got worse after iv TPA requring emergent mechanical thrombectomy but remains aphasic with hemiplegia. Recommend check swallow eval.Continue aspirin mobilize out of bed as tolerated. Long d/w patient, husband, daughter and answered questions. This patient is critically ill and at significant risk of neurological worsening, death and care requires constant monitoring of vital signs, hemodynamics,respiratory and cardiac monitoring, extensive review of multiple databases, frequent neurological assessment, discussion with family, other specialists and medical decision making of high complexity.I have made any additions or clarifications directly to the above note.This critical care time does not reflect procedure time, or teaching time or supervisory time of PA/NP/Med Resident etc but could involve care discussion  time.  I spent 30 minutes of neurocritical care time  in the care of  this patient.      Antony Contras, MD Medical Director [facility] Pager: [phone] 06/23/2017 3:39 PM   To contact Stroke Continuity provider, please refer to [email or web address] After hours, contact General Neurology

## 2017-06-23 NOTE — Progress Notes (Signed)
  Speech Language Pathology Treatment: Cognitive-Linquistic  Patient Details Name: Melinda Noble MRN: [id number] DOB: 18-Nov-1929 Today's Date: 06/23/2017 Time: 1415-1430 SLP Time Calculation (min) (ACUTE ONLY): 15 min  Assessment / Plan / Recommendation Clinical Impression  MBS cancelled today due to pseudoaneurysm right groin - lying flat; awaiting thrombin ejection.  Pt verbalizing more today; speech remains generally unintelligible.  However, producing greater variety of single words, social communication.  Max assist to follow simple commands or discriminate between two common objects.  Education with daughter re: aphasia as well as possible outcomes after MBS tomorrow.  We discussed possibility of oral diet with modifications vs continued NPO with NG feedings.  P's daughter verbalizes understanding.     HPI HPI: 83 year old female w/ sig h/o atrial fib (not on systemic A/C). Presented to the ER on 9/2 w/ new (R) sided hemiplegia.  Dx  left MCA CVA, S/P endovascularization in IR.  ETT 9/2-9/4.   MRI: acute/subacute L MCA infarct, involving L insular cortex, L frontal operculum, L posterior temporal and parietal lobe, and more anterior L superior frontal lobe, as well as acute infarct R parietal lobe and Rcentrum semiovale, embolic, in the setting of atheorsclerosis.      SLP Plan  Continue with current plan of care   MBS next date if pt appropriate.    Recommendations  Diet recommendations: NPO                Oral Care Recommendations: Oral care QID;Oral care prior to ice chip/H20 SLP Visit Diagnosis: Aphasia (R47.01) Plan: Continue with current plan of care       GO                [name] 06/23/2017, 2:35 PM

## 2017-06-23 NOTE — Transfer of Care (Signed)
Immediate Anesthesia Transfer of Care Note  Patient: Melinda Noble  Procedure(s) Performed: Procedure(s): repair right femoral pseudoanerysm; evacuation hemotoma (Right)  Patient Location: ICU  Anesthesia Type:General  Level of Consciousness: sedated, unresponsive and Patient remains intubated per anesthesia plan  Airway & Oxygen Therapy: Patient remains intubated per anesthesia plan and Patient placed on Ventilator (see vital sign flow sheet for setting)  Post-op Assessment: Report given to RN and Post -op Vital signs reviewed and stable  Post vital signs: Reviewed and stable  Last Vitals:  Vitals:   06/23/17 1500 06/23/17 1600  BP: (!) 108/91 (!) 142/84  Pulse: 72 77  Resp: (!) 23 (!) 26  Temp:    SpO2: 99% 100%    Last Pain:  Vitals:   06/23/17 1200  TempSrc: Oral         Complications: No apparent anesthesia complications

## 2017-06-23 NOTE — Progress Notes (Signed)
PULMONARY / CRITICAL CARE MEDICINE   Name: Melinda Noble MRN: [id number] DOB: 10-28-1929    ADMISSION DATE:  06/20/2017 CONSULTATION DATE:  9/2  REFERRING [id number]   CHIEF COMPLAINT:   Vent management post CVA critical care   HISTORY OF PRESENT ILLNESS:   This is a 83 year old female w/ sig h/o atrial fib (not on systemic A/C). Presented to the ER on 9/2 w/ new (R) sided hemiplegia (she was independent w/ ADLs prior to this).   ER course: MRA ->felt trickle flow to left MCA but no areas of infarct.  -received TPA in the ER-->clinically actually declined.  -repeat CT head-->negative for ICH -went to IR: for cerebral arteriogram and endo vascularization.  -returned to the ICU on vent. PCCM asked to a/w care   SUBJECTIVE:  No change overnight. Extubated, awake. Aphasic but nods appropriately.    VITAL SIGNS: BP (!) 166/70   Pulse 66   Temp 97.9 F (36.6 C) (Axillary)   Resp 16   Ht 5\' 8"  (1.727 m)   Wt 96.2 kg (212 lb 1.3 oz) Comment: Per ED weighing bed prior to CT  SpO2 100%   BMI 32.25 kg/m    INTAKE / OUTPUT: I/O last 3 completed shifts: In: 3411.1 [I.V.:3111.1; IV Piggyback:300] Out: 9326 [Urine:1550]  PHYSICAL EXAMINATION: Gen:      Acutely ill appearing female, NAD, awake and interactive HEENT:  [place]/AT, PERRL, EOM-I and significant R sided facial bruising/hematoma  Neck:      No masses; no thyromegaly Lungs:    Resps even non labored, CTA bilaterally CV:         RRR, Nl S1/S2, -M/R/G. Abd:      Soft, NT, ND and +BS Ext:    -edema and -tenderness Skin:       Warm and dry; no rash Neuro:     Moving extremities. agitated  LABS:  BMET  Recent Labs Lab 06/20/17 0819 06/20/17 0827 06/21/17 0349 06/23/17 0224  NA 138 142 140 139  K 4.1 4.1 3.9 4.1  CL 108 107 113* 111  CO2 22  --  20* 23  BUN 16 20 15 15   CREATININE 0.81 0.80 0.75 0.65  GLUCOSE 131* 131* 115* 126*   Electrolytes  Recent Labs Lab 06/20/17 0819 06/21/17 0349 06/23/17 0224   CALCIUM 10.0 8.9 8.8*  MG  --  1.7 2.0  PHOS  --  2.8 2.5   CBC  Recent Labs Lab 06/20/17 0819 06/20/17 0827 06/21/17 0349 06/23/17 0224  WBC 6.7  --  13.8* 7.8  HGB 13.3 13.9 11.9* 9.4*  HCT 41.6 41.0 37.4 29.9*  PLT 157  --  174 108*   Coag's  Recent Labs Lab 06/20/17 0819  APTT 32  INR 0.94   Sepsis Markers No results for input(s): LATICACIDVEN, PROCALCITON, O2SATVEN in the last 168 hours.  ABG  Recent Labs Lab 06/20/17 1522  PHART 7.309*  PCO2ART 44.8  PO2ART 170.0*   Liver Enzymes  Recent Labs Lab 06/20/17 0819 06/21/17 0349  AST 26  --   ALT 23  --   ALKPHOS 72  --   BILITOT 0.6  --   ALBUMIN 3.9 3.0*   Cardiac Enzymes No results for input(s): TROPONINI, PROBNP in the last 168 hours.  Glucose  Recent Labs Lab 06/22/17 1130 06/22/17 1547 06/22/17 1941 06/22/17 2326 06/23/17 0340 06/23/17 0830  GLUCAP 109* 105* 92 103* 118* 93   Imaging  STUDIES:  CT head 9/2: 1. Focal density  posteriorly in the left sylvian fissure may reflect contrast associated with a more distal occlusion. 2. No acute hemorrhage. 3. No evidence for progressing infarct. 4. Stable atrophy and white matter disease. 5. Increasing size of right infraorbital hematoma. MCA 9/2:  High grade stenosis or occlusion of the distal M1 seg w/ nonvisualization of the distal MCA branch   CXR 9/2: ET tube in good position, mild venous congestion. No infiltrate. I have reviewed the images personally.  CULTURES:   ANTIBIOTICS:   SIGNIFICANT EVENTS:   LINES/TUBES: ETT 9/2>>>9/4  DISCUSSION: Acute left MCA stroke w/ progression in spite of TPA. Now s/p IR for endovascularization.   ASSESSMENT / PLAN:  PULMONARY A: Need for mechanical ventilation for altered mental status - resolved.  abg reviewed PCXR still pending P:   Mobilize  Pulmonary hygiene  F/u CXR  If she were to fail, family would not want trach  CARDIOVASCULAR A:  Mild HTN  H/o afib P:  SBP  goal per stroke team (120-140) Cont tele monitoring Holding outpatient aspirin, Crestor, Cozaar, Lasix and Coreg  RENAL A:   No acute  P:   Trend chem  Strict I&O  GASTROINTESTINAL A:   Inadequate oral intake due to mental status - resolved.  P:   Speech following    HEMATOLOGIC A:   No acute w/out evidence of bleeding  P:  Trend cbc A/C per neuro  Transfuse per ICU protocol   INFECTIOUS A:   No evidence of infection  P:   Observe off antibiotics  ENDOCRINE A:   Hypothyroidism  P:   IV Synthroid  NEUROLOGIC A:   Left MCA stroke w/ right sided hemiplegia. Now s/p TPA and revascularization in IR P:   Supportive care  Per neuro  Pt/ot   FAMILY  - Updates: family updated at bedside 9/5.     Ok to tx out of ICU from [employer] standpoint.  PCCM signing off, please call back if needed,.    [name], NP 06/23/2017  9:18 AM Pager: [phone] or [phone]  Attending Note:  83 year old female s/p CVA intubated for airway protection, now extubated and doing well post extubation.  On exam, lungs are clear.  I reviewed CXR myself, ETT in good position and no acute disease noted.  Discussed with PCCM-NP.  Acute respiratory failure:  - Monitor closely for airway protection  - IS   - Flutter valve  Dysphagia:  - SLP today  - NPO til SLP  CVA:  - Supportive care at this point  - Neurology primary  HTN:  - Hold home anti-HTN since we need higher perfusion pressure  - If hypertensive, will start home drugs  Physical deconditioning  - PT  - OT  - OOB to chair  PCCM will sign off, please call back if needed.  Patient seen and examined, agree with above note.  I dictated the care and orders written for this patient under my direction.  Rush Farmer, [place]

## 2017-06-23 NOTE — Anesthesia Procedure Notes (Signed)
Arterial Line Insertion Start/End9/02/2017 7:55 PM, 06/23/2017 8:05 PM Performed by: Marcie Bal, Gahel Safley, anesthesiologist  Patient location: OR. Preanesthetic checklist: patient identified, IV checked, site marked, risks and benefits discussed, surgical consent, monitors and equipment checked, pre-op evaluation, timeout performed and anesthesia consent Patient sedated Left, brachial was placed Catheter size: 20 G Hand hygiene performed , maximum sterile barriers used  and Seldinger technique used  Attempts: 1 Procedure performed using ultrasound guided (unable to print image) technique. Ultrasound Notes:anatomy identified, needle tip was noted to be adjacent to the nerve/plexus identified and no ultrasound evidence of intravascular and/or intraneural injection Following insertion, line sutured. Post procedure assessment: normal  Patient tolerated the procedure well with no immediate complications. Additional procedure comments: Pt was under general anesthesia during this procedure.Marland Kitchen

## 2017-06-23 NOTE — Progress Notes (Signed)
Nutrition Follow-up  DOCUMENTATION CODES:   Obesity unspecified  INTERVENTION:   Monitor for diet advancement, supplement diet as needed.   NUTRITION DIAGNOSIS:   Inadequate oral intake related to inability to eat as evidenced by NPO status. Ongoing.   GOAL:   Patient will meet greater than or equal to 90% of their needs Not met.   MONITOR:   Diet advancement, I & O's  ASSESSMENT:   Pt with PMH of afib, HTN, ischemic cardiomyopathy, CAD, anemia admitted with L MCA stroke with progression despite tPA, now s/p IR for endovascularization.   Pt discussed during ICU rounds and with RN.  9/4 extubated Pt with pseudoaneurysm R groin, lying flat and awaiting thrombin injection  MBS delayed, possible for tomorrow  Medications reviewed IVF: NS @ 75 ml/hr Labs reviewed  Diet Order:  Diet NPO time specified  Skin:   (skin tear R arm)  Last BM:  ?9/3  Height:   Ht Readings from Last 1 Encounters:  06/20/17 '5\' 8"'  (1.727 m)    Weight:   Wt Readings from Last 1 Encounters:  06/20/17 212 lb 1.3 oz (96.2 kg)    Ideal Body Weight:  63.6 kg  BMI:  Body mass index is 32.25 kg/m.  Estimated Nutritional Needs:   Kcal:  1600-1800  Protein:  80-100 grams  Fluid:  > 1.6 L/day  EDUCATION NEEDS:   No education needs identified at this time  [place], [place], [place] Pager [phone] After Hours Pager

## 2017-06-23 NOTE — NC FL2 (Signed)
[place] MEDICAID FL2 LEVEL OF CARE SCREENING TOOL     IDENTIFICATION  Patient Name: Melinda Noble Birthdate: 08/17/1930 Sex: female Admission Date (Current Location): 06/20/2017  [facility] and [place] Number:  Herbalist and Address:  [facility] [place], [place] [address], [place], [place] [address]      Provider Number: [id number]  Attending Physician Name and Address:  Garvin Fila, MD  Relative Name and Phone Number:       Current Level of Care: Hospital Recommended Level of Care: [place] Prior Approval Number:    Date Approved/Denied:   PASRR Number: [id number]  Discharge Plan: SNF    Current Diagnoses: Patient Active Problem List   Diagnosis Date Noted  . Stroke (cerebrum) ([place]) 06/20/2017  . Hypothyroidism 06/03/2016  . Polypharmacy 06/03/2016  . H/O amiodarone therapy 02/08/2014  . Chronic systolic heart failure ([place]) 02/08/2014  . Left shoulder pain 01/15/2014  . Chest pain 11/25/2013  . Obesity (BMI 30.0-34.9) 11/06/2013  . H/o Orthostatic hypotension, not on diuretic due to this 10/24/2013  . Generalized anxiety disorder 10/18/2013  . HTN (hypertension) 10/16/2013  . Cardiomyopathy, ischemic- 30-35% on admission 10/09/13 --> increased to 40-45% prior to discharge. 10/16/2013  . Dyslipidemia 10/16/2013  . PAF- peri-MI. NSR on [place] 10/16/2013  . Acute pulmonary edema with congestive heart failure - due to Anterior STEMI- resolved 10/11/2013  . Cardiogenic shock- resolved 10/10/2013  . Presence of drug coated stent in LAD coronary artery - 2 overlapping Promx DES 2.5 x 38, 2.5 x 12 (postdilated to ~3 mm) 10/10/2013  . Metabolic acidosis- resolved 10/10/2013  . Acute respiratory failure due to hypoxia from acute pulmonary edema- resolved 10/10/2013  . CAD S/P percutaneous coronary angioplasty -- PCI to LAD in setting Ant STEMI; 2 overlapping Promx DES 2.5 x 38, 2.5 x 12 (postdilated to ~3 mm) 10/09/2013     Orientation RESPIRATION BLADDER Height & Weight     Self  O2 (Nasal Cannula 3:L) External catheter, Incontinent (Placed 9/4) Weight: 212 lb 1.3 oz (96.2 kg) (Per ED weighing bed prior to CT) Height:  '5\' 8"'  (172.7 cm)  BEHAVIORAL SYMPTOMS/MOOD NEUROLOGICAL BOWEL NUTRITION STATUS      Incontinent  (Please see d/c summary)  AMBULATORY STATUS COMMUNICATION OF NEEDS Skin   Extensive Assist Verbally Surgical wounds (Closed incision right groin, adhesive bandage)                       Personal Care Assistance Level of Assistance  Bathing, Feeding, Dressing Bathing Assistance: Maximum assistance Feeding assistance: Limited assistance Dressing Assistance: Maximum assistance     Functional Limitations Info  Sight, Hearing, Speech Sight Info: Adequate Hearing Info: Adequate Speech Info: Adequate    SPECIAL CARE FACTORS FREQUENCY  PT (By licensed PT), OT (By licensed OT)     PT Frequency: 3x week OT Frequency: 3x week            Contractures Contractures Info: Not present    Additional Factors Info  Code Status, Allergies Code Status Info: Full Code Allergies Info: Ace Inhibitors, Beta Adrenergic Blockers, Iohexol, Atorvastatin, Latex, Penicillins, Sulfa Antibiotics           Current Medications (06/23/2017):  This is the current hospital active medication list Current Facility-Administered Medications  Medication Dose Route Frequency Provider Last Rate Last Dose  . 0.9 %  sodium chloride infusion   Intravenous Continuous Luanne Bras, MD 75 mL/hr at 06/23/17 0800    .  acetaminophen (TYLENOL) tablet 650 mg  650 mg Oral Q4H PRN Greta Doom, MD       Or  . acetaminophen (TYLENOL) solution 650 mg  650 mg Per Tube Q4H PRN Greta Doom, MD       Or  . acetaminophen (TYLENOL) suppository 650 mg  650 mg Rectal Q4H PRN Greta Doom, MD      . chlorhexidine (PERIDEX) 0.12 % solution 15 mL  15 mL Mouth Rinse BID Garvin Fila, MD   15  mL at 06/23/17 0935  . chlorhexidine gluconate (MEDLINE KIT) (PERIDEX) 0.12 % solution 15 mL  15 mL Mouth Rinse BID Simonne Maffucci B, MD   15 mL at 06/22/17 2000  . famotidine (PEPCID) IVPB 20 mg premix  20 mg Intravenous Q12H Juanito Doom, MD   Stopped at 06/23/17 1005  . levothyroxine (SYNTHROID, LEVOTHROID) injection 50 mcg  50 mcg Intravenous Daily Rush Farmer, MD   50 mcg at 06/23/17 0935  . MEDLINE mouth rinse  15 mL Mouth Rinse q12n4p Garvin Fila, MD      . ondansetron [facility]) injection 4 mg  4 mg Intravenous Q6H PRN Deveshwar, Willaim Rayas, MD      . pantoprazole (PROTONIX) injection 40 mg  40 mg Intravenous QHS Greta Doom, MD   40 mg at 06/22/17 2206  . sertraline (ZOLOFT) tablet 100 mg  100 mg Per Tube Daily Greta Doom, MD   Stopped at 06/21/17 1000   Facility-Administered Medications Ordered in Other Encounters  Medication Dose Route Frequency Provider Last Rate Last Dose  . etomidate (AMIDATE) injection    Anesthesia Intra-op Fulton Reek, MD   14 mg at 10/10/13 0105  . rocuronium [facility]) injection    Anesthesia Intra-op Fulton Reek, MD   50 mg at 10/10/13 0110  . succinylcholine (ANECTINE) injection    Anesthesia Intra-op Fulton Reek, MD   100 mg at 10/10/13 0105     Discharge Medications: Please see discharge summary for a list of discharge medications.  Relevant Imaging Results:  Relevant Lab Results:   Additional Information SSN: [id number]  Eileen Stanford, LCSW  I have personally examined this patient, reviewed notes, independently viewed imaging studies, participated in medical decision making and plan of care.ROS completed by me personally and pertinent positives fully documented  I have made any additions or clarifications directly to the above note. Agree with note above.  Antony Contras, MD Medical Director [facility] Pager: [phone] 06/23/2017 8:54 PM

## 2017-06-23 NOTE — Progress Notes (Signed)
Referring Physician(s):  Dr. Roland Rack  Supervising Physician: Luanne Bras  Patient Status:  [facility] - In-pt  Chief Complaint:  MCA CVA  Subjective: Lying flat after hematoma yesterday.  Groin still with large bruising. Slurred speech.  To get speech eval today.   Allergies: Ace inhibitors; Beta adrenergic blockers; Iohexol; Atorvastatin; Latex; Penicillins; and Sulfa antibiotics  Medications: Prior to Admission medications   Medication Sig Start Date End Date Taking? Authorizing Provider  acetaminophen (TYLENOL) 325 MG tablet Take 650 mg by mouth every 6 (six) hours as needed for mild pain.   Yes [provider]  ALPRAZolam (XANAX) 0.25 MG tablet TAKE (1) TABLET TWICE DAILY AS NEEDED FOR ANXIETY. Patient taking differently: TAKE HALF TABLET (0.125mg ) TWICE DAILY AS NEEDED FOR ANXIETY. 04/19/17  Yes Reed, Tiffany L, DO  aspirin 81 MG chewable tablet Chew 1 tablet (81 mg total) by mouth daily. 10/17/13  Yes Lyda Jester M, PA-C  Biotin 1000 MCG tablet Take 1,000 mcg by mouth daily.   Yes [provider]  buPROPion (WELLBUTRIN SR) 150 MG 12 hr tablet Take 150 mg by mouth every morning.  08/22/16  Yes [provider]  calcium-vitamin D (OSCAL WITH D) 500-200 MG-UNIT per tablet Take 1 tablet by mouth daily.    Yes [provider]  carvedilol (COREG) 6.25 MG tablet Take 1 tablet (6.25 mg total) by mouth 2 (two) times daily with a meal. 04/09/14  Yes Leonie Man, MD  cholecalciferol (VITAMIN D) 1000 UNITS tablet Take 1,000 Units by mouth daily.   Yes [provider]  clobetasol ointment (TEMOVATE) 0.93 % Apply 1 application topically 2 (two) times daily as needed. (affected areas)--also thighs 07/29/16  Yes Reed, Tiffany L, DO  clopidogrel (PLAVIX) 75 MG tablet Take 75 mg by mouth daily.   Yes [provider]  fexofenadine (ALLEGRA) 60 MG tablet Take 60 mg by mouth 2 (two) times daily as needed for allergies or  rhinitis.   Yes [provider]  furosemide (LASIX) 20 MG tablet TAKE 1/2 TABLET DAILY. Patient taking differently: TAKE 1/2 TABLET (10mg ) DAILY. 05/03/17  Yes Reed, Tiffany L, DO  losartan (COZAAR) 100 MG tablet Take 1 tablet (100 mg total) by mouth daily. 12/30/16 06/21/17 Yes Belva Crome, MD  montelukast (SINGULAIR) 10 MG tablet Take 10 mg by mouth at bedtime.   Yes [provider]  Multiple Vitamin (MULTIVITAMIN WITH MINERALS) TABS tablet Take 1 tablet by mouth daily.   Yes [provider]  nitroGLYCERIN (NITROSTAT) 0.4 MG SL tablet Place 0.4 mg under the tongue every 5 (five) minutes as needed for chest pain.   Yes [provider]  omeprazole (PRILOSEC) 20 MG capsule Take 20 mg by mouth daily.    Yes [provider]  Polyethyl Glycol-Propyl Glycol (SYSTANE OP) Apply 1 drop to eye daily as needed (dry eyes).   Yes [provider]  rosuvastatin (CRESTOR) 5 MG tablet Take 10 mg by mouth 3 (three) times a week. (Mondays, Wednesdays, and Fridays)   Yes [provider]  sertraline (ZOLOFT) 100 MG tablet Take 100 mg by mouth daily.  07/06/16  Yes [provider]  SYNTHROID 100 MCG tablet TAKE 1 TABLET IN THE MORNING ON AN EMPTY STOMACH. Patient taking differently: TAKE 1 TABLET (162mcg) IN THE MORNING ON AN EMPTY STOMACH. 05/17/17  Yes Reed, Tiffany L, DO  vitamin E (VITAMIN E) 400 UNIT capsule Take 400 Units by mouth daily.   Yes [provider]  Vital Signs: BP (!) 166/70   Pulse 66   Temp 97.9 F (36.6 C) (Axillary)   Resp 16   Ht 5\' 8"  (1.727 m)   Wt 212 lb 1.3 oz (96.2 kg) Comment: Per ED weighing bed prior to CT  SpO2 100%   BMI 32.25 kg/m   Physical Exam  NAD, alert Neuro:  Moving all extremities; able to get OOB with OT yesterday. Follows with eyes.  Attempts to communicate. First words are very clear, however sentences slurred and intelligible.   Imaging: Ct Head Wo Contrast  Result Date:  06/22/2017 CLINICAL DATA:  Altered level consciousness.  Followup infarcts. EXAM: CT HEAD WITHOUT CONTRAST TECHNIQUE: Contiguous axial images were obtained from the base of the skull through the vertex without intravenous contrast. COMPARISON:  MRI of the head June 21, 2017 and CT HEAD June 21, 2017. FINDINGS: BRAIN: No intraparenchymal hemorrhage, mass effect nor midline shift. Evolving LEFT frontotemporal and parietal lobe nonhemorrhagic infarct with mild sulcal effacement. No midline shift. Patchy hypodensities LEFT basal ganglia corresponding areas of acute infarction. No hydrocephalus. No abnormal extra-axial fluid collections. VASCULAR: Mild calcific atherosclerosis of the carotid siphons. SKULL: No skull fracture. No significant scalp soft tissue swelling. SINUSES/ORBITS: Trace paranasal sinus mucosal thickening. Imaged mastoid air cells are well aerated. The included ocular globes and orbital contents are non-suspicious. Status post bilateral ocular lens implants. OTHER: RIGHT facial soft tissue swelling with subcutaneous fat stranding compatible with contusion, present on prior imaging. IMPRESSION: Evolving acute to subacute large LEFT MCA territory infarct without hemorrhagic conversion. No further propagation by CT. Electronically Signed   By: Elon Alas M.D.   On: 06/22/2017 21:38   Ct Head Wo Contrast  Result Date: 06/21/2017 CLINICAL DATA:  83 y/o  F; stroke for follow-up. EXAM: CT HEAD WITHOUT CONTRAST TECHNIQUE: Contiguous axial images were obtained from the base of the skull through the vertex without intravenous contrast. COMPARISON:  06/20/2017 CT head FINDINGS: Brain: Hypoattenuation within the left insula, left superior lentiform nucleus, left centrum semiovale compatible with infarction, stable in distribution comparison with the prior CT of head. Mild interval increase in hypoattenuation and edema associated with the infarct. Left posterior temporal and loss of gray-white  differentiation compatible with acute infarction, not present on prior MRI or CT of the head. No interval hemorrhage or significant mass effect. No effacement of basilar cisterns. Diminished size of density in the posterior left sylvian fissure. Stable chronic microvascular ischemic changes of the brain and parenchymal volume loss. Vascular: Calcific atherosclerosis of carotid siphons. Skull: Normal. Negative for fracture or focal lesion. Sinuses/Orbits: No acute finding. Other: None. IMPRESSION: 1. New left posterolateral temporal lobe acute infarction. 2. Stable infarction centered in left frontal centrum semiovale extending into left superior lentiform nucleus and insula in comparison with prior CT/MRI head. Interval increase in edema and local mass effect associated with the infarct. 3. No acute intracranial hemorrhage. These results will be called to the ordering clinician or representative by the Radiologist Assistant, and communication documented in the PACS or zVision Dashboard. Electronically Signed   By: Kristine Garbe M.D.   On: 06/21/2017 04:57   Ct Head Wo Contrast  Result Date: 06/20/2017 CLINICAL DATA:  Status post revascularization of the left MCA. Right-sided defects. EXAM: CT HEAD WITHOUT CONTRAST TECHNIQUE: Contiguous axial images were obtained from the base of the skull through the vertex without intravenous contrast. COMPARISON:  CT head without contrast of the same day. FINDINGS: Brain: A focal density is noted posteriorly within the  left sylvian fissure. This may represent contrast adjacent to a distal occlusion. There was faint density in this area on the earlier study in retrospect. No hemorrhage or mass lesion is present. Diffuse atrophy and white matter changes are otherwise stable. No focal cortical defects are present. The ventricles are stable. Vascular: Contrast is now noted within the major intracranial artery is. Distal left MCA lesion may be present as discussed above.  Skull: Calvarium is intact. No focal lytic or blastic lesions are present. Sinuses/Orbits: The paranasal sinuses and mastoid air cells are clear. Bilateral lens replacements are present. The globes and orbits are otherwise normal. Other: Right infraorbital hematoma has expanded. IMPRESSION: 1. Focal density posteriorly in the left sylvian fissure may reflect contrast associated with a more distal occlusion. 2. No acute hemorrhage. 3. No evidence for progressing infarct. 4. Stable atrophy and white matter disease. 5. Increasing size of right infraorbital hematoma. Electronically Signed   By: San Morelle M.D.   On: 06/20/2017 12:48   [name] Head Wo Contrast  Result Date: 06/20/2017 CLINICAL DATA:  Acute onset of right-sided weakness. Code stroke. Fall with right facial hematoma. EXAM: MRI HEAD WITHOUT CONTRAST MRA HEAD WITHOUT CONTRAST TECHNIQUE: Multiplanar, multiecho pulse sequences of the brain and surrounding structures were obtained without intravenous contrast. Angiographic images of the head were obtained using MRA technique without contrast. COMPARISON:  CT head without contrast from the same day. FINDINGS: MRI HEAD FINDINGS Diffusion only imaging was requested by Dr. Leonel Ramsay. The do pack scratched at the axial diffusion weighted images are mildly degraded by patient motion. There is an ill-defined area of potential restricted diffusion involving the left centrum semi ovale measuring 12 x 38 mm. No definite cortical lesions are present. There is no evidence for parenchymal hemorrhage. Right periorbital soft tissue hematoma is again noted. MRA HEAD FINDINGS The time-of-flight images are moderately degraded by patient motion. Flow is present in the internal carotid artery is through the ICA termini bilaterally. The A1 segments are patent bilaterally. ACA vessels are seen bilaterally. There is some narrowing of the distal right M1 segment. There is significant signal loss in the distal left M1  segment after an early inferior branch. Left MCA branch vessels are not visualized. There is marked attenuation of right MCA branch vessels. The left vertebral artery is the dominant vessel. The basilar artery is patent. Both posterior cerebral arteries originate from basilar tip. PCA branch vessels are attenuated bilaterally. IMPRESSION: 1. High-grade stenosis or occlusion of the distal left M1 segment with nonvisualization of distal MCA branch vessels. This corresponds with the patient's acute symptoms, likely representing an emergent large vessel occlusion. 2. Narrowing of the right M1 segment and attenuation of MCA branch vessels likely related to atherosclerotic disease. 3. Posterior circulation is intact. 4. Ill-defined area of restricted diffusion within white matter of the left centrum semi ovale may reflect acute ischemia. No definite cortical infarct is present. These results were called by telephone at the time of interpretation on 06/20/2017 at 9:28 am to Dr. Roland Rack , who verbally acknowledged these results. Electronically Signed   By: San Morelle M.D.   On: 06/20/2017 09:40   Mr Brain Wo Contrast  Result Date: 06/21/2017 CLINICAL DATA:  Right hemiplegia and aphasia. The patient remains intubated. EXAM: MRI HEAD WITHOUT CONTRAST TECHNIQUE: Multiplanar, multiecho pulse sequences of the brain and surrounding structures were obtained without intravenous contrast. COMPARISON:  CT head without contrast 06/21/2017 at 4:41 a.m. CT head without contrast 06/20/2017 12:08 p.m. MRI  brain and 06/20/2017 at 9:13 a.m. FINDINGS: Brain: Diffusion-weighted images now demonstrate significant extension of left MCA territory infarct. There is diffuse restricted diffusion at the left insular ribbon and a left frontal operculum. There is extensive involvement of the posterior left temporal lobe and parietal lobe. Smaller focal cortical involvement is seen in the anterior left frontal lobe and posterior  left parietal lobe. A focal area of restricted cortical will diffusion is present in the posterior right parietal lobe with scattered foci involving the posterior right centrum semiovale. Vascular: Abnormal signal is present in the proximal posterior left M2 branch suggesting slow or occluded flow. Proximal flow is present in the major intracranial arteries. Skull and upper cervical spine: The skullbase is within normal limits. The craniocervical junction is normal. Marrow signal is normal. The upper cervical spine is unremarkable. Sinuses/Orbits: Mild mucosal thickening is present within the anterior ethmoid air cells. The paranasal sinuses are otherwise clear. Mild mastoid fluid is now present. There is some fluid in the posterior oropharynx as well. The patient is intubated. Bilateral lens replacements are present. The globes and orbits are within normal limits. Other: Right periorbital hematoma is again noted. IMPRESSION: 1. Increased size of acute/subacute left MCA territory infarct, now involving the left insular cortex, left frontal operculum, posterior left temporal and parietal lobe, and more anterior superior left frontal lobe. 2. No evidence for hemorrhagic conversion of left-sided infarcts. 3. Focal acute nonhemorrhagic infarct in the right parietal lobe and centrum semiovale. Bilateral disease raises concern for a central cardio embolic source. 4. Right periorbital hematoma. Electronically Signed   By: San Morelle M.D.   On: 06/21/2017 12:45   Mr Brain Wo Contrast  Result Date: 06/20/2017 CLINICAL DATA:  Acute onset of right-sided weakness. Code stroke. Fall with right facial hematoma. EXAM: MRI HEAD WITHOUT CONTRAST MRA HEAD WITHOUT CONTRAST TECHNIQUE: Multiplanar, multiecho pulse sequences of the brain and surrounding structures were obtained without intravenous contrast. Angiographic images of the head were obtained using MRA technique without contrast. COMPARISON:  CT head without  contrast from the same day. FINDINGS: MRI HEAD FINDINGS Diffusion only imaging was requested by Dr. Leonel Ramsay. The do pack scratched at the axial diffusion weighted images are mildly degraded by patient motion. There is an ill-defined area of potential restricted diffusion involving the left centrum semi ovale measuring 12 x 38 mm. No definite cortical lesions are present. There is no evidence for parenchymal hemorrhage. Right periorbital soft tissue hematoma is again noted. MRA HEAD FINDINGS The time-of-flight images are moderately degraded by patient motion. Flow is present in the internal carotid artery is through the ICA termini bilaterally. The A1 segments are patent bilaterally. ACA vessels are seen bilaterally. There is some narrowing of the distal right M1 segment. There is significant signal loss in the distal left M1 segment after an early inferior branch. Left MCA branch vessels are not visualized. There is marked attenuation of right MCA branch vessels. The left vertebral artery is the dominant vessel. The basilar artery is patent. Both posterior cerebral arteries originate from basilar tip. PCA branch vessels are attenuated bilaterally. IMPRESSION: 1. High-grade stenosis or occlusion of the distal left M1 segment with nonvisualization of distal MCA branch vessels. This corresponds with the patient's acute symptoms, likely representing an emergent large vessel occlusion. 2. Narrowing of the right M1 segment and attenuation of MCA branch vessels likely related to atherosclerotic disease. 3. Posterior circulation is intact. 4. Ill-defined area of restricted diffusion within white matter of the left centrum semi  ovale may reflect acute ischemia. No definite cortical infarct is present. These results were called by telephone at the time of interpretation on 06/20/2017 at 9:28 am to Dr. Roland Rack , who verbally acknowledged these results. Electronically Signed   By: San Morelle M.D.   On:  06/20/2017 09:40   Dg Chest Port 1 View  Result Date: 06/20/2017 CLINICAL DATA:  routine EXAM: PORTABLE CHEST 1 VIEW COMPARISON:  9.2.18 FINDINGS: Endotracheal to 3.6 cm from carina in good position. Two stable large cardiac silhouette. Mild venous congestion. No infiltrate or pneumothorax. IMPRESSION: Endotracheal tube in good position Cardiomegaly and mild central venous congestion. Electronically Signed   By: Suzy Bouchard M.D.   On: 06/20/2017 17:20   Dg Chest Port 1 View  Result Date: 06/20/2017 CLINICAL DATA:  Acute respiratory failure with hypoxia EXAM: PORTABLE CHEST 1 VIEW COMPARISON:  05/04/2016 CXR, chest CT 10/09/2013 FINDINGS: The tip of an endotracheal tube is 2.6 cm the carina. Cardiomegaly is noted with tortuous atherosclerotic aorta. No aneurysm. Small granuloma is seen along the periphery of the left lower lobe, confirmed on prior CT. No effusion or pneumothorax no overt pulmonary edema or pneumonic consolidation. IMPRESSION: 1. Stable cardiomegaly.  Aortic atherosclerosis with mild uncoiling. 2. Slightly low-lying endotracheal tube at 2.6 cm above the carina. Slight pullback of the of the ETT is recommended by 1 cm. 3. No acute pulmonary disease. Electronically Signed   By: Ashley Royalty M.D.   On: 06/20/2017 16:07   Ct Head Code Stroke Wo Contrast  Result Date: 06/20/2017 CLINICAL DATA:  Code stroke. Change in mental status while in the MRI scanner. EXAM: CT HEAD WITHOUT CONTRAST TECHNIQUE: Contiguous axial images were obtained from the base of the skull through the vertex without intravenous contrast. COMPARISON:  None. FINDINGS: Brain: The study is moderately degraded by patient motion. There is no significant change in atrophy white matter disease. No new cortical lesions are present. Asymmetric white matter hypoattenuation is present in the left corona radiata. Vascular: No definite hyperdense vessel. Skull: Calvarium is intact. Right periorbital soft tissue hematoma has not changed  significantly. Sinuses/Orbits: The paranasal sinuses and mastoid air cells are clear. Globes and orbits are otherwise unremarkable. ASPECTS [facility] Stroke Program Early CT Score) - Ganglionic level infarction (caudate, lentiform nuclei, internal capsule, insula, M1-M3 cortex): 7/7 - Supraganglionic infarction (M4-M6 cortex): 3/3 Total score (0-10 with 10 being normal): 10/10 IMPRESSION: 1. No significant interval change. 2. Stable atrophy and white matter disease. 3. No definite hyperdense vessel. 4. Stable right periorbital hematoma. 5. ASPECTS is 10/10 These results were called by telephone at the time of interpretation on 06/20/2017 at 10:05 am to Dr. Roland Rack , who verbally acknowledged these results. Electronically Signed   By: San Morelle M.D.   On: 06/20/2017 10:05   Ct Head Code Stroke Wo Contrast  Result Date: 06/20/2017 CLINICAL DATA:  Code stroke. Acute onset of right-sided weakness and right facial droop with slurred speech. Fall. Right periorbital hematoma. EXAM: CT HEAD WITHOUT CONTRAST TECHNIQUE: Contiguous axial images were obtained from the base of the skull through the vertex without intravenous contrast. COMPARISON:  CT head without contrast 01/27/2013 FINDINGS: Brain: Mild atrophy and white matter changes are present bilaterally. The basal ganglia are intact. Insular ribbon is normal bilaterally. No acute or focal cortical abnormality is present. Vascular: Atherosclerotic calcifications are present within the cavernous internal carotid artery is bilaterally. There is no hyperdense vessel. Skull: The calvarium is intact. No focal lytic or blastic lesions  are present. Sinuses/Orbits: The paranasal sinuses and mastoid air cells are clear. The patient is status post bilateral lens replacements. May prominent right infraorbital hematoma is present without an underlying fracture. Zygomatic arch and orbital rim are intact. ASPECTS [facility] Stroke Program Early CT Score) -  Ganglionic level infarction (caudate, lentiform nuclei, internal capsule, insula, M1-M3 cortex): 7/7 - Supraganglionic infarction (M4-M6 cortex): 3/3 Total score (0-10 with 10 being normal): 10/10 IMPRESSION: 1. Stable atrophy and white matter disease. 2. No acute intracranial abnormality or significant interval change. 3. Prominent right infraorbital and lateral soft tissue hematoma without underlying fracture or intraorbital extension. 4. ASPECTS is 10/10 These results were called by telephone at the time of interpretation on 06/20/2017 at 8:28 am to Dr. Leonel Ramsay, who verbally acknowledged these results. Electronically Signed   By: San Morelle M.D.   On: 06/20/2017 08:35   Ct Maxillofacial Wo Contrast  Result Date: 06/20/2017 CLINICAL DATA:  Right periorbital hematoma after falling and hitting the right side of her face. EXAM: CT MAXILLOFACIAL WITHOUT CONTRAST TECHNIQUE: Multidetector CT imaging of the maxillofacial structures was performed. Multiplanar CT image reconstructions were also generated. COMPARISON:  Head CT obtained earlier today. FINDINGS: Osseous: No fractures. Orbits: Status post bilateral cataract extraction. Sinuses: Normally pneumatized. Soft tissues: Right infraorbital and periorbital hematoma. Limited intracranial: Per the head CT report earlier today. IMPRESSION: 1. No maxillofacial fracture. 2. Right infraorbital and periorbital soft tissue hematoma. Electronically Signed   By: Claudie Revering M.D.   On: 06/20/2017 09:02    Labs:  CBC:  Recent Labs  06/20/17 0819 06/20/17 0827 06/21/17 0349 06/23/17 0224  WBC 6.7  --  13.8* 7.8  HGB 13.3 13.9 11.9* 9.4*  HCT 41.6 41.0 37.4 29.9*  PLT 157  --  174 108*    COAGS:  Recent Labs  06/20/17 0819  INR 0.94  APTT 32    BMP:  Recent Labs  01/26/17 0855 06/20/17 0819 06/20/17 0827 06/21/17 0349 06/23/17 0224  NA 140 138 142 140 139  K 4.7 4.1 4.1 3.9 4.1  CL 104 108 107 113* 111  CO2 21 22  --  20* 23    GLUCOSE 101* 131* 131* 115* 126*  BUN 17 16 20 15 15   CALCIUM 10.4* 10.0  --  8.9 8.8*  CREATININE 0.79 0.81 0.80 0.75 0.65  GFRNONAA 69 >60  --  >60 >60  GFRAA 79 >60  --  >60 >60    LIVER FUNCTION TESTS:  Recent Labs  01/26/17 0855 06/20/17 0819 06/21/17 0349  BILITOT 0.5 0.6  --   AST 23 26  --   ALT 20 23  --   ALKPHOS 84 72  --   PROT 6.7 6.7  --   ALBUMIN 4.2 3.9 3.0*    Assessment and Plan: CVA s/p bilateral common carotid angiograms with endovascular revascularization of occluded L MCA achieving TICI 2b reperfusion. Patient with evidence of right inguinal hematoma yesterday.  She was placed back on bed rest.  Currently lying flat this AM. Will get stat US groin to assess for pseudoaneurysm.  Speech remains slurred.  To get Speech eval today.  IR to follow.   Electronically Signed: Docia Barrier, PA 06/23/2017, 10:15 AM   I spent a total of 15 Minutes at the the patient's bedside AND on the patient's hospital floor or unit, greater than 50% of which was counseling/coordinating care for CVA

## 2017-06-23 NOTE — Progress Notes (Signed)
Patient ID: Melinda Noble, female   DOB: [birth year], 83 y.o.   MRN: [id number] INR. Preliminary [place] report of the RT groin demonstrates a pseudoaneurysm associated with a hematoma.. Images reviewed with IR([facility]) Pseudoaneurysm  appropriate to occlusion with thrombin injection. This was discussed and explained to patients daughter who is agreeable proceed  with the treatment. Hopefully this can be done  today at her bedside.Arlean Hopping MD

## 2017-06-23 NOTE — Progress Notes (Signed)
  Echocardiogram 2D Echocardiogram has been performed.  Tresa Res 06/23/2017, 11:38 AM

## 2017-06-24 ENCOUNTER — Encounter (HOSPITAL_COMMUNITY): Payer: Self-pay | Admitting: Interventional Radiology

## 2017-06-24 DIAGNOSIS — E872 Acidosis: Secondary | ICD-10-CM

## 2017-06-24 LAB — GLUCOSE, CAPILLARY
GLUCOSE-CAPILLARY: 79 mg/dL (ref 65–99)
GLUCOSE-CAPILLARY: 90 mg/dL (ref 65–99)
GLUCOSE-CAPILLARY: 93 mg/dL (ref 65–99)
Glucose-Capillary: 97 mg/dL (ref 65–99)
Glucose-Capillary: 129 mg/dL — ABNORMAL HIGH (ref 65–99)
Glucose-Capillary: 51 mg/dL — ABNORMAL LOW (ref 65–99)
Glucose-Capillary: 98 mg/dL (ref 65–99)

## 2017-06-24 LAB — POCT I-STAT 3, ART BLOOD GAS (G3+)
Acid-base deficit: 5 mmol/L — ABNORMAL HIGH (ref 0.0–2.0)
Bicarbonate: 20.8 mmol/L (ref 20.0–28.0)
O2 SAT: 98 %
TCO2: 22 mmol/L (ref 22–32)
pCO2 arterial: 41 mmHg (ref 32.0–48.0)
pH, Arterial: 7.313 — ABNORMAL LOW (ref 7.350–7.450)
pO2, Arterial: 120 mmHg — ABNORMAL HIGH (ref 83.0–108.0)

## 2017-06-24 LAB — RENAL FUNCTION PANEL
Albumin: 2.7 g/dL — ABNORMAL LOW (ref 3.5–5.0)
Anion gap: 5 (ref 5–15)
BUN: 14 mg/dL (ref 6–20)
CALCIUM: 8.8 mg/dL — AB (ref 8.9–10.3)
CHLORIDE: 113 mmol/L — AB (ref 101–111)
CO2: 20 mmol/L — AB (ref 22–32)
CREATININE: 0.55 mg/dL (ref 0.44–1.00)
GFR calc Af Amer: >60 mL/min (ref >60)
GFR calc non Af Amer: >60 mL/min (ref >60)
GLUCOSE: 98 mg/dL (ref 65–99)
Phosphorus: 2.1 mg/dL — ABNORMAL LOW (ref 2.5–4.6)
Potassium: 3.8 mmol/L (ref 3.5–5.1)
SODIUM: 138 mmol/L (ref 135–145)

## 2017-06-24 LAB — CBC
HCT: 27 % — ABNORMAL LOW (ref 36.0–46.0)
Hemoglobin: 8.9 g/dL — ABNORMAL LOW (ref 12.0–15.0)
MCH: 29.9 pg (ref 26.0–34.0)
MCHC: 33 g/dL (ref 30.0–36.0)
MCV: 90.6 fL (ref 78.0–100.0)
PLATELETS: 78 10*3/uL — AB (ref 150–400)
RBC: 2.98 MIL/uL — AB (ref 3.87–5.11)
RDW: 15.2 % (ref 11.5–15.5)
WBC: 7.1 10*3/uL (ref 4.0–10.5)

## 2017-06-24 LAB — APTT: APTT: 33 s (ref 24–36)

## 2017-06-24 LAB — PROTIME-INR
INR: 1.27
PROTHROMBIN TIME: 15.8 s — AB (ref 11.4–15.2)

## 2017-06-24 LAB — PHOSPHORUS: Phosphorus: 2.1 mg/dL — ABNORMAL LOW (ref 2.5–4.6)

## 2017-06-24 LAB — MAGNESIUM: Magnesium: 1.8 mg/dL (ref 1.7–2.4)

## 2017-06-24 MED ORDER — DEXTROSE 50 % IV SOLN
INTRAVENOUS | Status: AC
  Administered 2017-06-24: 25 mL via INTRAVENOUS
  Filled 2017-06-24: qty 50

## 2017-06-24 MED ORDER — DEXTROSE 50 % IV SOLN
25.0000 mL | Freq: Once | INTRAVENOUS | Status: AC
  Administered 2017-06-24: 25 mL via INTRAVENOUS

## 2017-06-24 MED ORDER — DEXTROSE-NACL 5-0.9 % IV SOLN
INTRAVENOUS | Status: DC
  Administered 2017-06-24 – 2017-06-29 (×3): via INTRAVENOUS

## 2017-06-24 MED ORDER — SODIUM CHLORIDE 0.9% FLUSH
10.0000 mL | Freq: Two times a day (BID) | INTRAVENOUS | Status: DC
  Administered 2017-06-24 (×2): 10 mL
  Administered 2017-06-25: 40 mL

## 2017-06-24 MED ORDER — HALOPERIDOL LACTATE 5 MG/ML IJ SOLN
1.0000 mg | Freq: Once | INTRAMUSCULAR | Status: AC
  Administered 2017-06-24: 1 mg via INTRAVENOUS

## 2017-06-24 MED ORDER — SODIUM CHLORIDE 0.9% FLUSH: 10.0000 mL | INTRAVENOUS | Status: DC | PRN

## 2017-06-24 MED ORDER — HALOPERIDOL LACTATE 5 MG/ML IJ SOLN
INTRAMUSCULAR | Status: AC
  Filled 2017-06-24: qty 1

## 2017-06-24 MED ORDER — CHLORHEXIDINE GLUCONATE CLOTH 2 % EX PADS
6.0000 | MEDICATED_PAD | Freq: Every day | CUTANEOUS | Status: DC
  Administered 2017-06-24: 6 via TOPICAL

## 2017-06-24 NOTE — Progress Notes (Signed)
Patient's heart rhythm went from NSR to a controlled rate A-fib. Patient has a history of a-fib. CCM MD made aware. No new orders at this time. Will continue to monitor.

## 2017-06-24 NOTE — Procedures (Signed)
Extubation Procedure Note  Patient Details:   Name: Melinda Noble DOB: [birth year] MRN: [id number]   Airway Documentation:     Evaluation  O2 sats: stable throughout Complications: No apparent complications Patient did tolerate procedure well. Bilateral Breath Sounds: Rhonchi, Diminished   Yes   Patient tolerated wean. MD ordered to extubate. Positive for cuff leak. Patient extubated to a 4 Lpm nasal cannula. No signs of dyspnea or stridor. Patient instructed on the Incentive Spirometer by unable to perform. RN at bedside.   Myrtie Neither 06/24/2017, 10:24 AM

## 2017-06-24 NOTE — Progress Notes (Addendum)
[facility] [place]  Subjective  - Intubated and sedated due to coughing on vent.   Objective (!) 94/40 61 98.5 F (36.9 C) (Axillary) 17 100%  Intake/Output Summary (Last 24 hours) at 06/24/17 0757 Last data filed at 06/24/17 0700  Gross per 24 hour  Intake          3049.59 ml  Output             2635 ml  Net           414.59 ml    Doppler DP/PT right LE, drain in place 75 cc out put since surgery Left DP palpable Heart Loletha Grayer with hypotension 90/40's   Assessment/Planning: POD # 1 Repair of right common femoral artery pseudoaneurysm Left MCA stroke status post TPA status post embolectomy complicated by right femoral artery pseudoaneurysm status post operative repair Will watch drain out put to determine when to d/c  HGB 8.9 Hypotension and brady on Neo 20 mcg/min Change dressing tomorrow right groin There are discussions of possible extubation today.  Laurence Slate [facility] 06/24/2017 7:57 AM -- No further bleeding.  Dressing clean JP 75 overnight DPPT doppler right foot Ok for activity from my standpoint Keep drain for now  Ruta Hinds, MD [facility] [place]: [phone] Pager: [phone]   Laboratory Lab Results:  Recent Labs  06/23/17 2335 06/24/17 0410  WBC 8.8 7.1  HGB 9.1* 8.9*  HCT 28.2* 27.0*  PLT 93* 78*   BMET  Recent Labs  06/23/17 0224  06/23/17 2118 06/24/17 0410  NA 139  < > 141 138  K 4.1  < > 4.0 3.8  CL 111  --   --  113*  CO2 23  --   --  20*  GLUCOSE 126*  --   --  98  BUN 15  --   --  14  CREATININE 0.65  --   --  0.55  CALCIUM 8.8*  --   --  8.8*  < > = values in this interval not displayed.  COAG Lab Results  Component Value Date   INR 1.27 06/23/2017   INR 0.94 06/20/2017   INR 1.03 11/25/2013   No results found for: PTT

## 2017-06-24 NOTE — Progress Notes (Signed)
Dr. Titus Mould made aware patient had hypoglycemic event with CBG 51. CBG 126 after 1/2 amp D50. Patient remains NPO. Orders received and carried out. Nursing to continue to monitor.

## 2017-06-24 NOTE — Progress Notes (Signed)
eLink Physician-Brief Progress Note Patient Name: Melinda Noble DOB: 14-Apr-1930 MRN: [id number]   Date of Service  06/24/2017  HPI/Events of Note  Agitation - Sundowning? QTc = 0.36.  eICU Interventions  Will order: 1. Haldol 1 mg IV now. 2. Soft waist belt restraint.     Intervention Category Minor Interventions: Agitation / anxiety - evaluation and management  [name] 06/24/2017, 7:52 PM

## 2017-06-24 NOTE — Progress Notes (Signed)
Referring Physician(s):  Dr. Roland Rack  Supervising Physician: Luanne Bras  Patient Status:  [facility] - In-pt  Chief Complaint:  MCA CVA  Subjective: Lying flat after surgical repair of pseudoaneurysm yesterday.  Daughter reports patient is very restless. Slight improvement in word-finding today.    Allergies: Ace inhibitors; Beta adrenergic blockers; Iohexol; Atorvastatin; Latex; Penicillins; and Sulfa antibiotics  Medications: Prior to Admission medications   Medication Sig Start Date End Date Taking? Authorizing Provider  acetaminophen (TYLENOL) 325 MG tablet Take 650 mg by mouth every 6 (six) hours as needed for mild pain.   Yes [provider]  ALPRAZolam (XANAX) 0.25 MG tablet TAKE (1) TABLET TWICE DAILY AS NEEDED FOR ANXIETY. Patient taking differently: TAKE HALF TABLET (0.125mg ) TWICE DAILY AS NEEDED FOR ANXIETY. 04/19/17  Yes Reed, Tiffany L, DO  aspirin 81 MG chewable tablet Chew 1 tablet (81 mg total) by mouth daily. 10/17/13  Yes Lyda Jester M, PA-C  Biotin 1000 MCG tablet Take 1,000 mcg by mouth daily.   Yes [provider]  buPROPion (WELLBUTRIN SR) 150 MG 12 hr tablet Take 150 mg by mouth every morning.  08/22/16  Yes [provider]  calcium-vitamin D (OSCAL WITH D) 500-200 MG-UNIT per tablet Take 1 tablet by mouth daily.    Yes [provider]  carvedilol (COREG) 6.25 MG tablet Take 1 tablet (6.25 mg total) by mouth 2 (two) times daily with a meal. 04/09/14  Yes Leonie Man, MD  cholecalciferol (VITAMIN D) 1000 UNITS tablet Take 1,000 Units by mouth daily.   Yes [provider]  clobetasol ointment (TEMOVATE) 2.35 % Apply 1 application topically 2 (two) times daily as needed. (affected areas)--also thighs 07/29/16  Yes Reed, Tiffany L, DO  clopidogrel (PLAVIX) 75 MG tablet Take 75 mg by mouth daily.   Yes [provider]  fexofenadine (ALLEGRA) 60 MG tablet Take 60 mg by mouth 2 (two) times  daily as needed for allergies or rhinitis.   Yes [provider]  furosemide (LASIX) 20 MG tablet TAKE 1/2 TABLET DAILY. Patient taking differently: TAKE 1/2 TABLET (10mg ) DAILY. 05/03/17  Yes Reed, Tiffany L, DO  losartan (COZAAR) 100 MG tablet Take 1 tablet (100 mg total) by mouth daily. 12/30/16 06/21/17 Yes Belva Crome, MD  montelukast (SINGULAIR) 10 MG tablet Take 10 mg by mouth at bedtime.   Yes [provider]  Multiple Vitamin (MULTIVITAMIN WITH MINERALS) TABS tablet Take 1 tablet by mouth daily.   Yes [provider]  nitroGLYCERIN (NITROSTAT) 0.4 MG SL tablet Place 0.4 mg under the tongue every 5 (five) minutes as needed for chest pain.   Yes [provider]  omeprazole (PRILOSEC) 20 MG capsule Take 20 mg by mouth daily.    Yes [provider]  Polyethyl Glycol-Propyl Glycol (SYSTANE OP) Apply 1 drop to eye daily as needed (dry eyes).   Yes [provider]  rosuvastatin (CRESTOR) 5 MG tablet Take 10 mg by mouth 3 (three) times a week. (Mondays, Wednesdays, and Fridays)   Yes [provider]  sertraline (ZOLOFT) 100 MG tablet Take 100 mg by mouth daily.  07/06/16  Yes [provider]  SYNTHROID 100 MCG tablet TAKE 1 TABLET IN THE MORNING ON AN EMPTY STOMACH. Patient taking differently: TAKE 1 TABLET (169mcg) IN THE MORNING ON AN EMPTY STOMACH. 05/17/17  Yes Reed, Tiffany L, DO  vitamin E (VITAMIN E) 400 UNIT capsule Take 400 Units by mouth daily.   Yes [provider]     Vital Signs: BP (!) 127/48   Pulse 73   Temp 99.5 F (37.5 C) (Axillary)   Resp 19   Ht 5\' 8"  (1.727 m)   Wt 212 lb 1.3 oz (96.2 kg)   SpO2 98%   BMI 32.25 kg/m   Physical Exam  NAD, sleeping soundly Neuro:   Moves all extremities, slurred speech Groin:  Intact, soft, drain in place from surgical repair of pseudoaneurysm.  Imaging: Dg Abd 1 View  Result Date: 06/23/2017 CLINICAL DATA:  Central venous catheter in place. Chest  tube placement. Feeding tube placement. EXAM: ABDOMEN - 1 VIEW COMPARISON:  06/14/2014 FINDINGS: An enteric tube has been placed with tip in the left mid abdomen consistent with location in the distal body of the stomach. Scattered gas and stool in the colon. No small or large bowel distention. Calcifications in the right upper quadrant likely representing gallstones. Degenerative changes in the lumbar spine. IMPRESSION: Enteric tube tip localizes to the left mid abdomen consistent with location in the distal body of the stomach. Cholelithiasis. Nonobstructive bowel gas pattern. Electronically Signed   By: Lucienne Capers M.D.   On: 06/23/2017 22:52   Ct Head Wo Contrast  Result Date: 06/22/2017 CLINICAL DATA:  Altered level consciousness.  Followup infarcts. EXAM: CT HEAD WITHOUT CONTRAST TECHNIQUE: Contiguous axial images were obtained from the base of the skull through the vertex without intravenous contrast. COMPARISON:  MRI of the head June 21, 2017 and CT HEAD June 21, 2017. FINDINGS: BRAIN: No intraparenchymal hemorrhage, mass effect nor midline shift. Evolving LEFT frontotemporal and parietal lobe nonhemorrhagic infarct with mild sulcal effacement. No midline shift. Patchy hypodensities LEFT basal ganglia corresponding areas of acute infarction. No hydrocephalus. No abnormal extra-axial fluid collections. VASCULAR: Mild calcific atherosclerosis of the carotid siphons. SKULL: No skull fracture. No significant scalp soft tissue swelling. SINUSES/ORBITS: Trace paranasal sinus mucosal thickening. Imaged mastoid air cells are well aerated. The included ocular globes and orbital contents are non-suspicious. Status post bilateral ocular lens implants. OTHER: RIGHT facial soft tissue swelling with subcutaneous fat stranding compatible with contusion, present on prior imaging. IMPRESSION: Evolving acute to subacute large LEFT MCA territory infarct without hemorrhagic conversion. No further propagation by  CT. Electronically Signed   By: Elon Alas M.D.   On: 06/22/2017 21:38   Ct Head Wo Contrast  Result Date: 06/21/2017 CLINICAL DATA:  83 y/o  F; stroke for follow-up. EXAM: CT HEAD WITHOUT CONTRAST TECHNIQUE: Contiguous axial images were obtained from the base of the skull through the vertex without intravenous contrast. COMPARISON:  06/20/2017 CT head FINDINGS: Brain: Hypoattenuation within the left insula, left superior lentiform nucleus, left centrum semiovale compatible with infarction, stable in distribution comparison with the prior CT of head. Mild interval increase in hypoattenuation and edema associated with the infarct. Left posterior temporal and loss of gray-white differentiation compatible with acute infarction, not present on prior MRI or CT of the head. No interval hemorrhage or significant mass effect. No effacement of basilar cisterns. Diminished size of density in the posterior left sylvian fissure. Stable chronic microvascular ischemic changes of the brain and parenchymal volume loss. Vascular: Calcific atherosclerosis of carotid siphons. Skull: Normal. Negative for fracture or focal lesion. Sinuses/Orbits: No acute finding. Other: None. IMPRESSION: 1. New left posterolateral temporal lobe acute infarction. 2. Stable infarction centered in left frontal centrum semiovale extending into left superior lentiform nucleus and insula in comparison with prior CT/MRI head. Interval increase in edema and local mass  effect associated with the infarct. 3. No acute intracranial hemorrhage. These results will be called to the ordering clinician or representative by the Radiologist Assistant, and communication documented in the PACS or zVision Dashboard. Electronically Signed   By: Kristine Garbe M.D.   On: 06/21/2017 04:57   Mr Brain Wo Contrast  Result Date: 06/21/2017 CLINICAL DATA:  Right hemiplegia and aphasia. The patient remains intubated. EXAM: MRI HEAD WITHOUT CONTRAST TECHNIQUE:  Multiplanar, multiecho pulse sequences of the brain and surrounding structures were obtained without intravenous contrast. COMPARISON:  CT head without contrast 06/21/2017 at 4:41 a.m. CT head without contrast 06/20/2017 12:08 p.m. MRI brain and 06/20/2017 at 9:13 a.m. FINDINGS: Brain: Diffusion-weighted images now demonstrate significant extension of left MCA territory infarct. There is diffuse restricted diffusion at the left insular ribbon and a left frontal operculum. There is extensive involvement of the posterior left temporal lobe and parietal lobe. Smaller focal cortical involvement is seen in the anterior left frontal lobe and posterior left parietal lobe. A focal area of restricted cortical will diffusion is present in the posterior right parietal lobe with scattered foci involving the posterior right centrum semiovale. Vascular: Abnormal signal is present in the proximal posterior left M2 branch suggesting slow or occluded flow. Proximal flow is present in the major intracranial arteries. Skull and upper cervical spine: The skullbase is within normal limits. The craniocervical junction is normal. Marrow signal is normal. The upper cervical spine is unremarkable. Sinuses/Orbits: Mild mucosal thickening is present within the anterior ethmoid air cells. The paranasal sinuses are otherwise clear. Mild mastoid fluid is now present. There is some fluid in the posterior oropharynx as well. The patient is intubated. Bilateral lens replacements are present. The globes and orbits are within normal limits. Other: Right periorbital hematoma is again noted. IMPRESSION: 1. Increased size of acute/subacute left MCA territory infarct, now involving the left insular cortex, left frontal operculum, posterior left temporal and parietal lobe, and more anterior superior left frontal lobe. 2. No evidence for hemorrhagic conversion of left-sided infarcts. 3. Focal acute nonhemorrhagic infarct in the right parietal lobe and  centrum semiovale. Bilateral disease raises concern for a central cardio embolic source. 4. Right periorbital hematoma. Electronically Signed   By: San Morelle M.D.   On: 06/21/2017 12:45   Ir US Guide Bx Asp/drain  Result Date: 06/24/2017 CLINICAL DATA:  83 year old female with a history of emergent large vessel occlusion treated with thrombectomy 06/20/2017. Subsequent pseudoaneurysm of right common femoral artery with hemorrhage 06/23/2017. EXAM: UNILATERAL RIGHT LOWER EXTREMITY ARTERIAL DUPLEX SCAN TECHNIQUE: Gray-scale sonography as well as color Doppler and duplex ultrasound was performed to evaluate the arteries of the lower extremity. COMPARISON:  Vascular study of the same date performed by the vascular lab FINDINGS: Limited grayscale and color duplex imaging of the right inguinal region demonstrates heterogeneous fluid within the soft tissues overlying the femoral artery. Deidre Ala estimate of the diameter, proximally 15 cm, increased from the comparison study of the same day. Greatest depth of the fluid measures approximately 5.0 cm. Evidence of persistent pseudoaneurysm with the neck of the pseudoaneurysm diameter measuring at least 6 mm with short neck identified. IMPRESSION: Enlarging hematoma related to pseudoaneurysm the right common femoral artery with persisting flow, and neck of the pseudoaneurysm measuring 6 mm. Signed, Dulcy Fanny. Earleen Newport, DO [facility] Electronically Signed   By: Corrie Mckusick D.O.   On: 06/24/2017 09:44   Dg Chest Port 1 View  Result Date: 06/23/2017 CLINICAL  DATA:  Central venous catheter placement. EXAM: PORTABLE CHEST 1 VIEW COMPARISON:  Chest radiograph June 20, 2017 FINDINGS: The cardiac silhouette is mildly enlarged and unchanged. Pulmonary vascular congestion. Patient rotated to the RIGHT accentuating the mediastinal silhouette. Calcified aortic knob. Endotracheal tube tip projects 4.3 cm above the  carina. A temperature probe in the esophagus. Nasogastric tube past proximal stomach, distal tip not imaged. RIGHT internal jugular central venous catheter distal tip projects in mid superior vena cava. Small pleural effusions. No pneumothorax. Multiple lines and tubes overlie the patient. Soft tissue planes and included osseous structures are unchanged. IMPRESSION: RIGHT internal jugular central venous catheter distal tip projects in mid superior vena cava. Stable endotracheal tube. Nasogastric tube past proximal stomach. Mild cardiomegaly and pulmonary vascular congestion. Small pleural effusions. Electronically Signed   By: Elon Alas M.D.   On: 06/23/2017 22:52   Dg Chest Port 1 View  Result Date: 06/20/2017 CLINICAL DATA:  routine EXAM: PORTABLE CHEST 1 VIEW COMPARISON:  9.2.18 FINDINGS: Endotracheal to 3.6 cm from carina in good position. Two stable large cardiac silhouette. Mild venous congestion. No infiltrate or pneumothorax. IMPRESSION: Endotracheal tube in good position Cardiomegaly and mild central venous congestion. Electronically Signed   By: Suzy Bouchard M.D.   On: 06/20/2017 17:20   Dg Chest Port 1 View  Result Date: 06/20/2017 CLINICAL DATA:  Acute respiratory failure with hypoxia EXAM: PORTABLE CHEST 1 VIEW COMPARISON:  05/04/2016 CXR, chest CT 10/09/2013 FINDINGS: The tip of an endotracheal tube is 2.6 cm the carina. Cardiomegaly is noted with tortuous atherosclerotic aorta. No aneurysm. Small granuloma is seen along the periphery of the left lower lobe, confirmed on prior CT. No effusion or pneumothorax no overt pulmonary edema or pneumonic consolidation. IMPRESSION: 1. Stable cardiomegaly.  Aortic atherosclerosis with mild uncoiling. 2. Slightly low-lying endotracheal tube at 2.6 cm above the carina. Slight pullback of the of the ETT is recommended by 1 cm. 3. No acute pulmonary disease. Electronically Signed   By: Ashley Royalty M.D.   On: 06/20/2017 16:07     Labs:  CBC:  Recent Labs  06/21/17 0349 06/23/17 0224 06/23/17 2008 06/23/17 2118 06/23/17 2335 06/24/17 0410  WBC 13.8* 7.8  --   --  8.8 7.1  HGB 11.9* 9.4* 6.1* 7.8* 9.1* 8.9*  HCT 37.4 29.9* 18.0* 23.0* 28.2* 27.0*  PLT 174 108*  --   --  93* 78*    COAGS:  Recent Labs  06/20/17 0819 06/23/17 2335  INR 0.94 1.27  APTT 32 33    BMP:  Recent Labs  06/20/17 0819 06/20/17 0827 06/21/17 0349 06/23/17 0224 06/23/17 2008 06/23/17 2118 06/24/17 0410  NA 138 142 140 139 139 141 138  K 4.1 4.1 3.9 4.1 3.8 4.0 3.8  CL 108 107 113* 111  --   --  113*  CO2 22  --  20* 23  --   --  20*  GLUCOSE 131* 131* 115* 126*  --   --  98  BUN 16 20 15 15   --   --  14  CALCIUM 10.0  --  8.9 8.8*  --   --  8.8*  CREATININE 0.81 0.80 0.75 0.65  --   --  0.55  GFRNONAA >60  --  >60 >60  --   --  >60  GFRAA >60  --  >60 >60  --   --  >60    LIVER FUNCTION TESTS:  Recent Labs  01/26/17 0855 06/20/17 0819 06/21/17  5183 06/24/17 0410  BILITOT 0.5 0.6  --   --   AST 23 26  --   --   ALT 20 23  --   --   ALKPHOS 84 72  --   --   PROT 6.7 6.7  --   --   ALBUMIN 4.2 3.9 3.0* 2.7*    Assessment and Plan: CVA s/p bilateral common carotid angiograms with endovascular revascularization of occluded L MCA achieving TICI 2b reperfusion. Patient lying flat after pseudoaneurysm repair. Groin more soft today.  Speech slurred, but improved word-finding. MBS tomorrow. Daughter at bedside.  Electronically Signed: Docia Barrier, PA 06/24/2017, 3:44 PM   I spent a total of 15 Minutes at the the patient's bedside AND on the patient's hospital floor or unit, greater than 50% of which was counseling/coordinating care for CVA

## 2017-06-24 NOTE — Consult Note (Signed)
PULMONARY / CRITICAL CARE MEDICINE   Name: Melinda Noble MRN: [id number] DOB: 1930-02-20    ADMISSION DATE:  06/20/2017 CONSULTATION DATE:  06/24/2017  REFERRING MD:  Dr. Leonie Man  CHIEF COMPLAINT:  Vent management  HISTORY OF PRESENT ILLNESS:  HPI obtained from medical chart review as patient is intubated and sedated.  83 year old female with PMH significant for PAF not on systemic anticoagulation who presented to the ER on 9/2 with new Right sided hemiplegia and fall at home.  MRI/ MRA showed acute/subacute L MCA infarct and high-grade L M1 stenosis.  She was given TPA and clinically deteriorated requiring intubation.  Repeat CT head negative.  Patient taken to IR for embolectomy.  Sheath pulled 9/3.  Hematoma noted on 9/4 after mobilizing and was monitored and stable.  Was extubated on 9/4 and PCCM signed off 9/5.  Vascular consulted and patient found to have pseudoaneurysm of right groin on ultrasound.  Patient electively taken to OR 9/5 for repair; EBL 650 ml in surgery, w/Hgb drop from 9.4 to 6.1, transfused 2 units PRBC with improvement to 9.1.  Returns to ICU on vent postoperatively.  PCCM re-consulted for vent management.    PAST MEDICAL HISTORY :  She  has a past medical history of Anemia; CAD (coronary artery disease); CAD S/P percutaneous coronary angioplasty (10/09/13); Cardiogenic shock ([place]); Depression; Dyslipidemia; H/O Acute respiratory failure due to hypoxia (10/10/2013); Hypertension; Hypothyroidism; Ischemic cardiomyopathy; Migraine headache; Orthostatic hypotension; PAF (paroxysmal atrial fibrillation) ([place]); and Sinus bradycardia.  PAST SURGICAL HISTORY: She  has a past surgical history that includes Shoulder surgery; Coronary angioplasty with stent (10/09/13); left heart catheterization with coronary angiogram (N/A, 10/09/2013); and Radiology with anesthesia (N/A, 06/20/2017).  Allergies  Allergen Reactions  . Ace Inhibitors Anaphylaxis  . Beta Adrenergic Blockers Anaphylaxis   . Iohexol Anaphylaxis     Code: HIVES, Desc: anaphylactic shock s/p contrast injection many yrs ago--suggested that pt NEVER have iv contrast//a.c., Onset Date: [id number]   . Atorvastatin Rash  . Latex Rash  . Penicillins Swelling and Rash    Details are unknown  . Sulfa Antibiotics Rash    Current Facility-Administered Medications on File Prior to Encounter  Medication  . etomidate (AMIDATE) injection  . rocuronium (ZEMURON) injection  . succinylcholine (ANECTINE) injection   Current Outpatient Prescriptions on File Prior to Encounter  Medication Sig  . ALPRAZolam (XANAX) 0.25 MG tablet TAKE (1) TABLET TWICE DAILY AS NEEDED FOR ANXIETY. (Patient taking differently: TAKE HALF TABLET (0.125mg ) TWICE DAILY AS NEEDED FOR ANXIETY.)  . aspirin 81 MG chewable tablet Chew 1 tablet (81 mg total) by mouth daily.  . Biotin 1000 MCG tablet Take 1,000 mcg by mouth daily.  Marland Kitchen buPROPion (WELLBUTRIN SR) 150 MG 12 hr tablet Take 150 mg by mouth every morning.   . calcium-vitamin D (OSCAL WITH D) 500-200 MG-UNIT per tablet Take 1 tablet by mouth daily.   . carvedilol (COREG) 6.25 MG tablet Take 1 tablet (6.25 mg total) by mouth 2 (two) times daily with a meal.  . cholecalciferol (VITAMIN D) 1000 UNITS tablet Take 1,000 Units by mouth daily.  . clobetasol ointment (TEMOVATE) 0.10 % Apply 1 application topically 2 (two) times daily as needed. (affected areas)--also thighs  . furosemide (LASIX) 20 MG tablet TAKE 1/2 TABLET DAILY. (Patient taking differently: TAKE 1/2 TABLET (10mg ) DAILY.)  . losartan (COZAAR) 100 MG tablet Take 1 tablet (100 mg total) by mouth daily.  Marland Kitchen omeprazole (PRILOSEC) 20 MG capsule Take 20 mg by mouth  daily.   . rosuvastatin (CRESTOR) 5 MG tablet Take 10 mg by mouth 3 (three) times a week. (Mondays, Wednesdays, and Fridays)  . sertraline (ZOLOFT) 100 MG tablet Take 100 mg by mouth daily.   Marland Kitchen SYNTHROID 100 MCG tablet TAKE 1 TABLET IN THE MORNING ON AN EMPTY STOMACH. (Patient taking  differently: TAKE 1 TABLET (170mcg) IN THE MORNING ON AN EMPTY STOMACH.)  . vitamin E (VITAMIN E) 400 UNIT capsule Take 400 Units by mouth daily.    FAMILY HISTORY:  Her indicated that her mother is deceased. She indicated that her father is deceased. She indicated that her maternal grandmother is deceased. She indicated that her maternal grandfather is deceased. She indicated that her paternal grandmother is deceased. She indicated that her paternal grandfather is deceased.   SOCIAL HISTORY: She  reports that she has quit smoking. She has never used smokeless tobacco. She reports that she does not drink alcohol or use drugs.  REVIEW OF SYSTEMS:   Unable, patient currently intubated and sedated on mechanical ventilation.  SUBJECTIVE:  Hypotensive and bradycardic on precedex - d/c'd with improvement.  Placed on fentanyl currently at 75 mcg/hr, intermittently withdrawing to pain, not following commands per RN.    VITAL SIGNS: BP 140/69   Pulse 64   Temp 97.6 F (36.4 C) (Axillary)   Resp 16   Ht 5\' 8"  (1.727 m)   Wt 212 lb 1.3 oz (96.2 kg)   SpO2 100%   BMI 32.25 kg/m   HEMODYNAMICS: CVP:  [10 mmHg-13 mmHg] 13 mmHg  VENTILATOR SETTINGS: Vent Mode: PRVC FiO2 (%):  [50 %-100 %] 50 % Set Rate:  [14 bmp-16 bmp] 16 bmp Vt Set:  [510 mL] 510 mL PEEP:  [5 cmH20] 5 cmH20 Plateau Pressure:  [19 cmH20-20 cmH20] 20 cmH20  INTAKE / OUTPUT: I/O last 3 completed shifts: In: 3026.9 [I.V.:2726.9; IV Piggyback:300] Out: 1325 [PJKDT:2671]  PHYSICAL EXAMINATION: General:  Acutely ill appearing elderly female lying in bed in NAD HEENT: ETT/ OGT, Pupils 3/sluggish, significant right sided facial/neck bruising/hematoma, small amount of bruising to left inner eye Neuro: sedated, flickers eyes to verbal, not follow commands CV: rrr, no m/r/g PULM: even/non-labored on MV, lungs bilaterally clear GI: obese, soft, bsx4 active  Extremities: warm/dry, no edema, bruising to right groin/inner  thigh- soft, dressing c/i, JP drain noted, + dp pulse Skin: no rashes  LABS:  BMET  Recent Labs Lab 06/20/17 0819 06/20/17 0827 06/21/17 0349 06/23/17 0224 06/23/17 2008 06/23/17 2118  NA 138 142 140 139 139 141  K 4.1 4.1 3.9 4.1 3.8 4.0  CL 108 107 113* 111  --   --   CO2 22  --  20* 23  --   --   BUN 16 20 15 15   --   --   CREATININE 0.81 0.80 0.75 0.65  --   --   GLUCOSE 131* 131* 115* 126*  --   --     Electrolytes  Recent Labs Lab 06/20/17 0819 06/21/17 0349 06/23/17 0224  CALCIUM 10.0 8.9 8.8*  MG  --  1.7 2.0  PHOS  --  2.8 2.5    CBC  Recent Labs Lab 06/21/17 0349 06/23/17 0224 06/23/17 2008 06/23/17 2118 06/23/17 2335  WBC 13.8* 7.8  --   --  8.8  HGB 11.9* 9.4* 6.1* 7.8* 9.1*  HCT 37.4 29.9* 18.0* 23.0* 28.2*  PLT 174 108*  --   --  93*    Coag's  Recent Labs Lab  06/20/17 0819 06/23/17 2335  APTT 32 33  INR 0.94 1.27    Sepsis Markers No results for input(s): LATICACIDVEN, PROCALCITON, O2SATVEN in the last 168 hours.  ABG  Recent Labs Lab 06/23/17 2008 06/23/17 2118 06/24/17 0015  PHART 7.378 7.353 7.313*  PCO2ART 32.0 36.8 41.0  PO2ART 94.0 337.0* 120.0*    Liver Enzymes  Recent Labs Lab 06/20/17 0819 06/21/17 0349  AST 26  --   ALT 23  --   ALKPHOS 72  --   BILITOT 0.6  --   ALBUMIN 3.9 3.0*    Cardiac Enzymes No results for input(s): TROPONINI, PROBNP in the last 168 hours.  Glucose  Recent Labs Lab 06/23/17 0340 06/23/17 0830 06/23/17 1211 06/23/17 1617 06/23/17 2151 06/23/17 2331  GLUCAP 118* 93 76 96 98 102*    Imaging Dg Abd 1 View  Result Date: 06/23/2017 CLINICAL DATA:  Central venous catheter in place. Chest tube placement. Feeding tube placement. EXAM: ABDOMEN - 1 VIEW COMPARISON:  06/14/2014 FINDINGS: An enteric tube has been placed with tip in the left mid abdomen consistent with location in the distal body of the stomach. Scattered gas and stool in the colon. No small or large bowel  distention. Calcifications in the right upper quadrant likely representing gallstones. Degenerative changes in the lumbar spine. IMPRESSION: Enteric tube tip localizes to the left mid abdomen consistent with location in the distal body of the stomach. Cholelithiasis. Nonobstructive bowel gas pattern. Electronically Signed   By: Lucienne Capers M.D.   On: 06/23/2017 22:52   Dg Chest Port 1 View  Result Date: 06/23/2017 CLINICAL DATA:  Central venous catheter placement. EXAM: PORTABLE CHEST 1 VIEW COMPARISON:  Chest radiograph June 20, 2017 FINDINGS: The cardiac silhouette is mildly enlarged and unchanged. Pulmonary vascular congestion. Patient rotated to the RIGHT accentuating the mediastinal silhouette. Calcified aortic knob. Endotracheal tube tip projects 4.3 cm above the carina. A temperature probe in the esophagus. Nasogastric tube past proximal stomach, distal tip not imaged. RIGHT internal jugular central venous catheter distal tip projects in mid superior vena cava. Small pleural effusions. No pneumothorax. Multiple lines and tubes overlie the patient. Soft tissue planes and included osseous structures are unchanged. IMPRESSION: RIGHT internal jugular central venous catheter distal tip projects in mid superior vena cava. Stable endotracheal tube. Nasogastric tube past proximal stomach. Mild cardiomegaly and pulmonary vascular congestion. Small pleural effusions. Electronically Signed   By: Elon Alas M.D.   On: 06/23/2017 22:52   STUDIES:  CT head 9/2>>  1. Focal density posteriorly in the left sylvian fissure may reflect contrast associated with a more distal occlusion. 2. No acute hemorrhage. 3. No evidence for progressing infarct. 4. Stable atrophy and white matter disease. 5. Increasing size of right infraorbital hematoma. MCA 9/2:   High grade stenosis or occlusion of the distal M1 seg w/ nonvisualization of the distal MCA branch  9/5 R groin art [place] >> positive pseudoaneurysm of  right groin measuring 4.68 x 4.11 cm w/surrounding hematoma; PSA neck measures 0.58 cm and is 1.36 cm long  CULTURES: 9/2 MRSA PCR >> negative  ANTIBIOTICS: none  SIGNIFICANT EVENTS: 9/2 Admit 9/4 extubated  9/5 to OR   LINES/TUBES: PIV ETT 9/2 >>9/4; 9/5 >> OGT 9/5 >> Aline 9/5 >> Right JP drain 9/5 >>  DISCUSSION: 3 yoF admitted on 9/2 with acute L MCA stroke w/progression in spite of TPA s/p IR for embolectomy on 9/2, extubated on 9/4.  Developed right groin pseudoaneurysm and taken to  OR for repair on 9/5.  ASSESSMENT / PLAN:  PULMONARY A: Need for mechanical ventilation - post-operatively P:   PRVC 8 cc/kg Wean FiO2/PEEP for sats > 94% Rest on vent overnight post-op Trend CXR prn VAP protocol   CARDIOVASCULAR A:  Hypotension- r/t ABLA / sedation, resolved Right groin pseudoaneurysm s/p repair 9/5 Hx PAF, HTN - CVP 10 P:  Tele monitoring SBP goal 120-140 Neosynephrine prn  Trend CVP Holding outpt aspirin, Crestor, Cozaar, Lasix and Coreg Vascular following  JP drain in right groin per Vascular  RENAL A:   No acute issues  P:   NS at 62ml/hr Trend BMP / urinary output Strict I&O Replace electrolytes as indicated  GASTROINTESTINAL A:   Dysphasia  NPO P:   NPO SLP following Continue OGT Continue protonix daily  HEMATOLOGIC A:   ABLA - h/h stable post-op s/p 2 units PRBC in OR P:  CBC this am Transfuse for hgb < 7 Monitor for further bleeding   INFECTIOUS A:   No evidence of infection P:   Monitor WBC/ fever curve  ENDOCRINE A:   Hypothyroidism P:   Continue IV synthroid  NEUROLOGIC A:   Left MCA stroke/ w right sided hemiplegia s/p TPA on 9/2 and revascularization in IR P:   RASS goal: 0/-1 Fentanyl gtt for pain Daily [facility] PT/OT following  Management per Stroke Team    FAMILY  - Updates: No family at bedside   - Inter-disciplinary family meet or Palliative Care meeting due by:  06/30/17  CCT 40 mins  Kennieth Rad, ACNP Pulmonary and [place] Pager: [phone]  06/24/2017, 12:33 AM

## 2017-06-24 NOTE — Progress Notes (Signed)
PT Cancellation Note  Patient Details Name: Melinda Noble MRN: [id number] DOB: [birth year]   Cancelled Treatment:    Reason Eval/Treat Not Completed: Medical issues which prohibited therapy; patient just extubated this am.  Will check back in pm if able to tolerate PT.    Reginia Naas 06/24/2017, 10:41 AM Magda Kiel, PT [phone] 06/24/2017

## 2017-06-24 NOTE — Progress Notes (Signed)
Dr. Titus Mould made aware ABP and NBP not correlating. Have been using ABP measurements. Go by ABP per MD. Nursing to continue to monitor.

## 2017-06-24 NOTE — Progress Notes (Signed)
PULMONARY / CRITICAL CARE MEDICINE   Name: Melinda Noble MRN: [id number] DOB: 1929/12/03    ADMISSION DATE:  06/20/2017 CONSULTATION DATE:  9/2  REFERRING UR:[id number]   CHIEF COMPLAINT:   Vent management post CVA critical care   HISTORY OF PRESENT ILLNESS:   This is a 83 year old female w/ sig h/o atrial fib (not on systemic A/C). Presented to the ER on 9/2 w/ new (R) sided hemiplegia (she was independent w/ ADLs prior to this).   ER course: MRA ->felt trickle flow to left MCA but no areas of infarct.  -received TPA in the ER-->clinically actually declined.  -repeat CT head-->negative for ICH -went to IR: for cerebral arteriogram and endo vascularization.  -returned to the ICU on vent. PCCM asked to a/w care   SUBJECTIVE:  To OR overnight for worsening R groin hematoma. Found to have R femoral artery pseudoaneurysm. Left intubated post op.   Now tol PS 5/5. Awake.  Looks good.   VITAL SIGNS: BP (!) 118/50   Pulse (!) 59   Temp 98.5 F (36.9 C) (Axillary)   Resp 17   Ht 5\' 8"  (1.727 m)   Wt 96.2 kg (212 lb 1.3 oz)   SpO2 100%   BMI 32.25 kg/m    INTAKE / OUTPUT: I/O last 3 completed shifts: In: 4140.1 [I.V.:3490.1; IV Piggyback:650] Out: 3151 [Urine:1780; Drains:75; Other:630; Blood:600]  PHYSICAL EXAMINATION: Gen:      Acutely ill appearing female, NAD on PS wean, awake and interactive HEENT:  [place]/AT, PERRL, EOM-I and significant R sided facial bruising/hematoma  Neck:      No masses; no thyromegaly Lungs:    Resps even non labored on PS 5/5 Vt 600, RR 16, CTA bilaterally CV:         RRR, Nl S1/S2, -M/R/G. Abd:      Soft, NT, ND and +BS Ext:    -edema and -tenderness Skin:       Warm and dry; no rash, R groin extensive bruising, dressing c/d, JP drain Neuro:     Moving extremities. agitated  LABS:  BMET  Recent Labs Lab 06/21/17 0349 06/23/17 0224 06/23/17 2008 06/23/17 2118 06/24/17 0410  NA 140 139 139 141 138  K 3.9 4.1 3.8 4.0 3.8  CL 113* 111   --   --  113*  CO2 20* 23  --   --  20*  BUN 15 15  --   --  14  CREATININE 0.75 0.65  --   --  0.55  GLUCOSE 115* 126*  --   --  98   Electrolytes  Recent Labs Lab 06/21/17 0349 06/23/17 0224 06/24/17 0410  CALCIUM 8.9 8.8* 8.8*  MG 1.7 2.0 1.8  PHOS 2.8 2.5 2.1*  2.1*   CBC  Recent Labs Lab 06/23/17 0224  06/23/17 2118 06/23/17 2335 06/24/17 0410  WBC 7.8  --   --  8.8 7.1  HGB 9.4*  < > 7.8* 9.1* 8.9*  HCT 29.9*  < > 23.0* 28.2* 27.0*  PLT 108*  --   --  93* 78*  < > = values in this interval not displayed. Coag's  Recent Labs Lab 06/20/17 0819 06/23/17 2335  APTT 32 33  INR 0.94 1.27   Sepsis Markers No results for input(s): LATICACIDVEN, PROCALCITON, O2SATVEN in the last 168 hours.  ABG  Recent Labs Lab 06/23/17 2008 06/23/17 2118 06/24/17 0015  PHART 7.378 7.353 7.313*  PCO2ART 32.0 36.8 41.0  PO2ART 94.0 337.0*  120.0*   Liver Enzymes  Recent Labs Lab 06/20/17 0819 06/21/17 0349 06/24/17 0410  AST 26  --   --   ALT 23  --   --   ALKPHOS 72  --   --   BILITOT 0.6  --   --   ALBUMIN 3.9 3.0* 2.7*   Cardiac Enzymes No results for input(s): TROPONINI, PROBNP in the last 168 hours.  Glucose  Recent Labs Lab 06/23/17 1211 06/23/17 1617 06/23/17 2151 06/23/17 2331 06/24/17 0331 06/24/17 0821  GLUCAP 76 96 98 102* 97 79   Imaging  STUDIES:  CT head 9/2: 1. Focal density posteriorly in the left sylvian fissure may reflect contrast associated with a more distal occlusion. 2. No acute hemorrhage. 3. No evidence for progressing infarct. 4. Stable atrophy and white matter disease. 5. Increasing size of right infraorbital hematoma. MCA 9/2:  High grade stenosis or occlusion of the distal M1 seg w/ nonvisualization of the distal MCA branch   CXR 9/2: ET tube in good position, mild venous congestion. No infiltrate. I have reviewed the images personally.  CULTURES:   ANTIBIOTICS:   SIGNIFICANT EVENTS: 9/5>> OR for repair of R  femoral pseudoaneurysm  LINES/TUBES: ETT 9/2>>>9/4, 9/5>>>  DISCUSSION: Acute left MCA stroke w/ progression in spite of TPA. Now s/p IR for endovascularization.   ASSESSMENT / PLAN:  PULMONARY A: Need for mechanical ventilation for altered mental status.  Reintubated for OR 9/5  abg reviewed PCXR still pending P:   extubate Mobilize  Pulmonary hygiene  F/u CXR  If she were to fail, family would not want trach  CARDIOVASCULAR A:  Mild HTN  H/o afib P:  SBP goal per stroke team (120-140) Cont tele monitoring Holding outpatient aspirin, Crestor, Cozaar, Lasix and Coreg  RENAL A:   No acute  P:   Trend chem  Strict I&O  GASTROINTESTINAL A:   Inadequate oral intake due to mental status - resolved.  P:   NPO for now  Speech following  Plan MBS in am 9/7   HEMATOLOGIC A:   No acute w/out evidence of bleeding  P:  Trend cbc A/C per neuro  Transfuse per ICU protocol   INFECTIOUS A:   No evidence of infection  P:   Observe off antibiotics  ENDOCRINE A:   Hypothyroidism  P:   IV Synthroid  NEUROLOGIC A:   Left MCA stroke w/ right sided hemiplegia. Now s/p TPA and revascularization in IR P:   Supportive care  Per neuro  Pt/ot   FAMILY  - Updates: husband and daughter updated at bedside 9/6.      Melinda Madrid, NP 06/24/2017  9:22 AM Pager: [phone] or [phone]   STAFF NOTE: I, Merrie Roof, MD FACP have personally reviewed patient's available data, including medical history, events of note, physical examination and test results as part of my evaluation. I have discussed with resident/NP and other care providers such as pharmacist, RN and RRT. In addition, I personally evaluated patient and elicited key findings of: fc, moves ext, on vent, calm, coarse bs to clear, limited edema, perrl, CT I reviewed last with ischemic cva left MCA, and MRI I reviewed with same, no ich, pcxr I reviewed shows atx, rotation, ij line wnl, back  from OR remained with ett with h/o cva, assess cough, air way protection and RSBi, wean aggressive cpap 5 ps 5, goal 30 min, consider extubation, noted thrombocytopenia assuming is consumption from hematoma formation, follow trend, limit  ois balance, a line to dc asap, line neck to dc asap, I updated family in full, will need SLP done post extubation with MCA CVA The patient is critically ill with multiple organ systems failure and requires high complexity decision making for assessment and support, frequent evaluation and titration of therapies, application of advanced monitoring technologies and extensive interpretation of multiple databases.   Critical Care Time devoted to patient care services described in this note is 30 Minutes. This time reflects time of care of this signee: Merrie Roof, MD FACP. This critical care time does not reflect procedure time, or teaching time or supervisory time of PA/NP/Med student/Med Resident etc but could involve care discussion time. Rest per NP/medical resident whose note is outlined above and that I agree with   Lavon Paganini. Titus Mould, MD, [place] Pgr: [facility] 06/24/2017 9:35 AM

## 2017-06-24 NOTE — Clinical Social Work Note (Signed)
Clinical Social Work Assessment  Patient Details  Name: Melinda Noble MRN: [id number] Date of Birth: 10/03/1930  Date of referral:  06/24/17               Reason for consult:  Facility Placement                Permission sought to share information with:  Family Supports Permission granted to share information::  Yes, Verbal Permission Granted  Name::     Melinda Noble  Relationship::  Spouse  Contact Information:  [phone]  Housing/Transportation Living arrangements for the past 2 months:  [place] Environmental education officer) Source of Information:  Spouse, Adult Children Patient Interpreter Needed:  None Criminal Activity/Legal Involvement Pertinent to Current Situation/Hospitalization:  No - Comment as needed Significant Relationships:  Adult Children, Spouse Lives with:  Spouse Do you feel safe going back to the place where you live?  Yes Need for family participation in patient care:  Yes (Comment)  Care giving concerns:  Patient spouse and daughter at bedside and just inquired about patient plan at discharge.  Patient spouse would like to be sure that patient is returning to [name] for rehab not long term care.   Social Worker assessment / plan:  Holiday representative met with patient spouse and daughter at bedside to offer support and discuss patient needs at discharge.  Patient is a current resident at [facility] independent living with her husband and plan is to return to [facility] at discharge.  Patient spouse requests [facility] transport at discharge.  CSW left message with facility to confirm patient discharge plans.  CSW remains available for support and to facilitate patient discharge needs once medically stable.  Employment status:  Retired Forensic scientist:  Medicare PT Recommendations:  [place] / Referral to community resources:  [place]  Patient/Family's Response to care:  Patient family verbalized  understanding of CSW role and appreciation for support and concern.  Patient family agreeable with placement at [facility].  Patient/Family's Understanding of and Emotional Response to Diagnosis, Current Treatment, and Prognosis:  Patient family understanding of current limitations and barriers to return home.  Patient family aware that patient will need continued rehab prior to return home with spouse.  Emotional Assessment Appearance:  Appears stated age Attitude/Demeanor/Rapport:  Unable to Assess Affect (typically observed):  Unable to Assess Orientation:  Oriented to Self Alcohol / Substance use:  Not Applicable Psych involvement (Current and /or in the community):  No (Comment)  Discharge Needs  Concerns to be addressed:  No discharge needs identified Readmission within the last 30 days:  No Current discharge risk:  None Barriers to Discharge:  Continued Medical Work up  [employer], [place]

## 2017-06-24 NOTE — Progress Notes (Deleted)
Physical Therapy Treatment Patient Details Name: Melinda Noble MRN: [id number] DOB: 11-Mar-1930 Today's Date: 06/24/2017    History of Present Illness 83 y.o. female with a history of afib, not on anti-coagulants who presents with right sided weakness.Her husband states that he spoke with her while he was getting out of bed and she was normal, she then was up getting ready when he heard her fall in the bathroom, and found her on the floor with a large R periorbital hematoma, right-sided weakness, and slurred speech.  Initial NIHSS 8.  Administered tPA bolus at 0941 on 06/20/2017. taken emergently to IR and intubated and achieved a reperfusion. Extubated 06/22/17.     PT Comments    Patient progressing with R LE stability in stance, was not able to maintain for pivotal steps to chair, however and assisted to floor for safety.  Patient with pusher tendency and unable to correct balance to L even with assist.  Patient more alert this am and attempts at communication improved.  Feel continued skilled PT in the acute setting indicated prior to d/c to SNF level rehab.    Follow Up Recommendations  SNF     Equipment Recommendations  Other (comment) (TBA)    Recommendations for Other Services       Precautions / Restrictions Precautions Precautions: Fall Precaution Comments: R LE weakness    Mobility  Bed Mobility Overal bed mobility: Needs Assistance Bed Mobility: Rolling;Sidelying to Sit Rolling: Mod assist Sidelying to sit: Max assist;+2 for physical assistance       General bed mobility comments: rolled with cues and assist to reach R UE to railing, cleaned in sidelying due to fecal incontinence; then assisted legs off bed and trunk upright.  Transfers Overall transfer level: Needs assistance Equipment used: Rolling walker (2 wheeled) Transfers: Sit to/from [facility] Sit to Stand: Mod assist;+2 physical assistance Stand pivot transfers: Mod assist;Max assist;+2  physical assistance       General transfer comment: able to stand from bed with lifting help, taking steps towards chair with RW with cues for R LE extension, L lateral weight shift, attempted to pull chair up to pt and R LE gave away so lowered pt to floor, notified RN and used maximove to lift to chair.  Ambulation/Gait                 Stairs            Wheelchair Mobility    Modified Rankin (Stroke Patients Only) Modified Rankin (Stroke Patients Only) Pre-Morbid Rankin Score: No significant disability Modified Rankin: Severe disability     Balance Overall balance assessment: Needs assistance   Sitting balance-Leahy Scale: Poor Sitting balance - Comments: leaning back and to R sitting EOB, cues for anterior and L weight shift with S to min A Postural control: Right lateral lean;Posterior lean Standing balance support: Bilateral upper extremity supported Standing balance-Leahy Scale: Poor                              Cognition Arousal/Alertness: Awake/alert Behavior During Therapy: WFL for tasks assessed/performed Overall Cognitive Status: Impaired/Different from baseline Area of Impairment: Orientation;Attention;Following commands;Problem solving;Safety/judgement                 Orientation Level: Disoriented to;Situation Current Attention Level: Sustained   Following Commands: Follows one step commands consistently Safety/Judgement: Decreased awareness of safety;Decreased awareness of deficits   Problem Solving: Requires verbal cues;Requires  tactile cues        Exercises General Exercises - Lower Extremity Ankle Circles/Pumps: AROM;Both;10 reps;Supine Heel Slides: AAROM;Both;10 reps;Supine    General Comments        Pertinent Vitals/Pain Faces Pain Scale: Hurts little more Pain Location: LE's Pain Descriptors / Indicators: Sore;Tender Pain Intervention(s): Monitored during session;Repositioned    Home Living                       Prior Function            PT Goals (current goals can now be found in the care plan section) Progress towards PT goals: Progressing toward goals    Frequency    Min 3X/week      PT Plan Current plan remains appropriate    Co-evaluation              AM-PAC PT "6 Clicks" Daily Activity  Outcome Measure  Difficulty turning over in bed (including adjusting bedclothes, sheets and blankets)?: Unable Difficulty moving from lying on back to sitting on the side of the bed? : Unable Difficulty sitting down on and standing up from a chair with arms (e.g., wheelchair, bedside commode, etc,.)?: Unable Help needed moving to and from a bed to chair (including a wheelchair)?: A Lot Help needed walking in hospital room?: Total Help needed climbing 3-5 steps with a railing? : Total 6 Click Score: 7    End of Session Equipment Utilized During Treatment: Gait belt Activity Tolerance: Patient limited by fatigue Patient left: in chair;with chair alarm set;with call bell/phone within reach;with nursing/sitter in room Nurse Communication: Mobility status;Other (comment) (pt lowered to floor) PT Visit Diagnosis: Other symptoms and signs involving the nervous system (R29.898);Other abnormalities of gait and mobility (R26.89);Hemiplegia and hemiparesis Hemiplegia - Right/Left: Right Hemiplegia - dominant/non-dominant: Non-dominant Hemiplegia - caused by: Cerebral infarction     Time: 0920-0956 PT Time Calculation (min) (ACUTE ONLY): 36 min  Charges:  $Therapeutic Activity: 23-37 mins                    G CodesMagda Kiel, [place] [phone] 06/24/2017    Reginia Naas 06/24/2017, 10:38 AM

## 2017-06-24 NOTE — Progress Notes (Signed)
Physical Therapy Treatment Patient Details Name: Melinda Noble MRN: [id number] DOB: Jan 31, 1930 Today's Date: 06/24/2017    History of Present Illness 83 y.o. female with a history of afib, not on anti-coagulants who presents with right sided weakness.Her husband states that he spoke with her while he was getting out of bed and she was normal, she then was up getting ready when he heard her fall in the bathroom, and found her on the floor with a large R periorbital hematoma, right-sided weakness, and slurred speech.  Initial NIHSS 8.  Administered tPA bolus at 0941 on 06/20/2017. taken emergently to IR and intubated and achieved a reperfusion. Extubated 06/22/17.     PT Comments    Patient remains limited by communication deficits due to aphasia as well as HOH and hearing aides not in this pm.  She did not complain much of R groin wound, but was focused/perseverated on her name when first asked her name and could not get last name, so continued to try despite initial attempts to refocus.  Feel she remains appropriate for SNF level rehab.  PT to follow.    Follow Up Recommendations  SNF     Equipment Recommendations  Other (comment) (TBA)    Recommendations for Other Services       Precautions / Restrictions Precautions Precautions: Fall Precaution Comments: R weakness/inattention    Mobility  Bed Mobility Overal bed mobility: Needs Assistance Bed Mobility: Rolling;Sidelying to Sit Rolling: Max assist Sidelying to sit: Max assist;+2 for physical assistance       General bed mobility comments: cues to reach with L UE for railing, but limited by communication, assist for legs off bed and to lift trunk (pt went into LE extension in sidelying)  Transfers Overall transfer level: Needs assistance Equipment used: Rolling walker (2 wheeled) Transfers: Sit to/from [facility] Sit to Stand: Max assist;+2 physical assistance Stand pivot transfers: Mod assist;+2 physical  assistance       General transfer comment: lifting and lowering help from bed to chair, assist with walker to step around to chair, R LE assisted to move back when backing up to chair, assist for moving walker   Ambulation/Gait                 Stairs            Wheelchair Mobility    Modified Rankin (Stroke Patients Only) Modified Rankin (Stroke Patients Only) Pre-Morbid Rankin Score: No significant disability Modified Rankin: Severe disability     Balance Overall balance assessment: Needs assistance   Sitting balance-Leahy Scale: Poor Sitting balance - Comments: tendency to lean back esp as performing LE therex for LAQ despite cues to work on balance instead Postural control: Posterior lean Standing balance support: Bilateral upper extremity supported Standing balance-Leahy Scale: Poor Standing balance comment: assist and UE support in standing                            Cognition Arousal/Alertness: Awake/alert Behavior During Therapy: Restless Overall Cognitive Status: Impaired/Different from baseline Area of Impairment: Orientation;Attention;Following commands;Problem solving;Safety/judgement                   Current Attention Level: Focused   Following Commands: Follows one step commands inconsistently;Follows one step commands with increased time Safety/Judgement: Decreased awareness of safety;Decreased awareness of deficits   Problem Solving: Slow processing;Decreased initiation;Requires verbal cues;Requires tactile cues;Difficulty sequencing General Comments: also limited by expressive aphasia  Exercises      General Comments General comments (skin integrity, edema, etc.): daughter in room and requesting for RN to call for PT help OOB even though earlier RN requested we let her sleep      Pertinent Vitals/Pain Pain Assessment: Faces Faces Pain Scale: Hurts little more Pain Location: R groin Pain Descriptors /  Indicators: Grimacing Pain Intervention(s): Monitored during session    Home Living                      Prior Function            PT Goals (current goals can now be found in the care plan section) Progress towards PT goals: Progressing toward goals    Frequency    Min 3X/week      PT Plan Current plan remains appropriate    Co-evaluation              AM-PAC PT "6 Clicks" Daily Activity  Outcome Measure  Difficulty turning over in bed (including adjusting bedclothes, sheets and blankets)?: Unable Difficulty moving from lying on back to sitting on the side of the bed? : Unable Difficulty sitting down on and standing up from a chair with arms (e.g., wheelchair, bedside commode, etc,.)?: Unable Help needed moving to and from a bed to chair (including a wheelchair)?: A Lot Help needed walking in hospital room?: Total Help needed climbing 3-5 steps with a railing? : Total 6 Click Score: 7    End of Session Equipment Utilized During Treatment: Gait belt;Oxygen Activity Tolerance: Patient limited by fatigue Patient left: in chair;with call bell/phone within reach;with family/visitor present Nurse Communication: Mobility status PT Visit Diagnosis: Other symptoms and signs involving the nervous system (R29.898);Other abnormalities of gait and mobility (R26.89);Hemiplegia and hemiparesis Hemiplegia - Right/Left: Right Hemiplegia - dominant/non-dominant: Non-dominant Hemiplegia - caused by: Cerebral infarction     Time: 7741-2878 PT Time Calculation (min) (ACUTE ONLY): 25 min  Charges:  $Therapeutic Activity: 23-37 mins                    G CodesMagda Kiel, [place] [phone] 06/24/2017    Reginia Naas 06/24/2017, 5:29 PM

## 2017-06-24 NOTE — Progress Notes (Signed)
STROKE TEAM PROGRESS NOTE   HISTORY OF PRESENT ILLNESS (per record)  CC: Right sided weakness  History is obtained from:patient  HPI: Melinda Noble is a 83 y.o. female with a history of afib, not on anti-coagulants who presents with right sided weakness. She was initially plegic on the right, but by the time of arrival to the ED, was having significant improvement.  Her husband states that he spoke with her while he was getting out of bed and she was normal, she then was up getting ready when he heard her fall in the bathroom, and found her on the floor with a large R periorbital hematoma, right-sided weakness, and slurred speech.  Initial NIHSS 8.  Administered tPA bolus at 0941 on 06/20/2017. Stat MRI with high-grade stenosis or occlusion of the distal left M1 segment with nonvisualization of distal MCA branch vessels, became dyspneic, and was taken emergently to IR and intubated.  Per IR, "[w]ith x 2 passes with solitaire 39mm x 40 mm FR retriever device achieved a TICI 2 b reperfusion".      LKW: 6:30 am on 06/21/2017 tpa given?: yes Modified Rankin Score: 0 VAN: positive   Patient was administered IV t-PA at 0941 ib 06/20/2017. She was admitted to the neuro ICU for further evaluation and treatment.   SUBJECTIVE (INTERVAL HISTORY) Her  Husband and  daughter are  at the bedside.  The patient   Developed right groin pseudoaneurysm Yesterday which did not respond to treatment with thrombin injection and required emergency surgery for treatment. She is awake intubated and remains aphasic but can follow occasional midline commands OBJECTIVE Temp:  [97.6 F (36.4 C)-100.7 F (38.2 C)] 99.5 F (37.5 C) (09/06 1200) Pulse Rate:  [55-77] 73 (09/06 1400) Cardiac Rhythm: Normal sinus rhythm (09/06 0800) Resp:  [0-26] 19 (09/06 1400) BP: (94-168)/(40-84) 127/48 (09/06 1400) SpO2:  [98 %-100 %] 98 % (09/06 1400) Arterial Line BP: (117-172)/(46-66) 144/57 (09/06 1400) FiO2 (%):  [40 %-100 %] 40 %  (09/06 0810) Weight:  [212 lb 1.3 oz (96.2 kg)] 212 lb 1.3 oz (96.2 kg) (09/05 1759)  CBC:   Recent Labs Lab 06/20/17 0819  06/21/17 0349  06/23/17 2335 06/24/17 0410  WBC 6.7  --  13.8*  < > 8.8 7.1  NEUTROABS 4.3  --  11.1*  --   --   --   HGB 13.3  < > 11.9*  < > 9.1* 8.9*  HCT 41.6  < > 37.4  < > 28.2* 27.0*  MCV 94.5  --  94.7  < > 91.0 90.6  PLT 157  --  174  < > 93* 78*  < > = values in this interval not displayed.  Basic Metabolic Panel:   Recent Labs Lab 06/23/17 0224  06/23/17 2118 06/24/17 0410  NA 139  < > 141 138  K 4.1  < > 4.0 3.8  CL 111  --   --  113*  CO2 23  --   --  20*  GLUCOSE 126*  --   --  98  BUN 15  --   --  14  CREATININE 0.65  --   --  0.55  CALCIUM 8.8*  --   --  8.8*  MG 2.0  --   --  1.8  PHOS 2.5  --   --  2.1*  2.1*  < > = values in this interval not displayed.  Lipid Panel:     Component Value Date/Time  CHOL 133 06/21/2017 0349   CHOL 152 01/26/2017 0855   TRIG 121 06/21/2017 0349   HDL 45 06/21/2017 0349   HDL 56 01/26/2017 0855   CHOLHDL 3.0 06/21/2017 0349   VLDL 24 06/21/2017 0349   LDLCALC 64 06/21/2017 0349   LDLCALC 78 01/26/2017 0855   HgbA1c:  Lab Results  Component Value Date   HGBA1C 5.4 06/21/2017   Urine Drug Screen:     Component Value Date/Time   LABOPIA NONE DETECTED 06/20/2017 2201   COCAINSCRNUR NONE DETECTED 06/20/2017 2201   LABBENZ POSITIVE (A) 06/20/2017 2201   AMPHETMU NONE DETECTED 06/20/2017 2201   THCU NONE DETECTED 06/20/2017 2201   LABBARB NONE DETECTED 06/20/2017 2201    Alcohol Level No results found for: Charlo Head Code Stroke Wo Contrast 06/20/2017 IMPRESSION: 1. No significant interval change. 2. Stable atrophy and white matter disease. 3. No definite hyperdense vessel. 4. Stable right periorbital hematoma. 5. ASPECTS is 10/10 These results were called by telephone at the time of interpretation on 06/20/2017 at 10:05 am to Dr. Roland Rack , who verbally  acknowledged these results.  Ct Head Code Stroke Wo Contrast 06/20/2017 IMPRESSION: 1. Stable atrophy and white matter disease. 2. No acute intracranial abnormality or significant interval change. 3. Prominent right infraorbital and lateral soft tissue hematoma without underlying fracture or intraorbital extension. 4. ASPECTS is 10/10  Ct Head Wo Contrast 06/20/2017 IMPRESSION: 1. Focal density posteriorly in the left sylvian fissure may reflect contrast associated with a more distal occlusion. 2. No acute hemorrhage. 3. No evidence for progressing infarct. 4. Stable atrophy and white matter disease. 5. Increasing size of right infraorbital hematoma.   Ct Head Wo Contrast 06/21/2017 IMPRESSION: 1. New left posterolateral temporal lobe acute infarction. 2. Stable infarction centered in left frontal centrum semiovale extending into left superior lentiform nucleus and insula in comparison with prior CT/MRI head. Interval increase in edema and local mass effect associated with the infarct. 3. No acute intracranial hemorrhage. These results will be called to the ordering clinician or representative by the Radiologist Assistant, and communication documented in the PACS or zVision Dashboard.   Ct Maxillofacial Wo Contrast 06/20/2017 IMPRESSION: 1. No maxillofacial fracture. 2. Right infraorbital and periorbital soft tissue hematoma.   [name] Head Wo Contrast 06/20/2017 IMPRESSION: 1. High-grade stenosis or occlusion of the distal left M1 segment with nonvisualization of distal MCA branch vessels. This corresponds with the patient's acute symptoms, likely representing an emergent large vessel occlusion. 2. Narrowing of the right M1 segment and attenuation of MCA branch vessels likely related to atherosclerotic disease. 3. Posterior circulation is intact. 4. Ill-defined area of restricted diffusion within white matter of the left centrum semi ovale may reflect acute ischemia. No definite cortical infarct is  present.  Mr Brain Wo Contrast 06/21/2017 IMPRESSION: 1. Increased size of acute/subacute left MCA territory infarct, now involving the left insular cortex, left frontal operculum, posterior left temporal and parietal lobe, and more anterior superior left frontal lobe. 2. No evidence for hemorrhagic conversion of left-sided infarcts. 3. Focal acute nonhemorrhagic infarct in the right parietal lobe and centrum semiovale. Bilateral disease raises concern for a central cardio embolic source. 4. Right periorbital hematoma.   Dg Chest Port 1 View 06/20/2017 IMPRESSION: Endotracheal tube in good position Cardiomegaly and mild central venous congestion.  Dg Chest Port 1 View 06/20/2017 IMPRESSION: 1. Stable cardiomegaly.  Aortic atherosclerosis with mild uncoiling. 2. Slightly low-lying endotracheal tube at 2.6 cm above the carina.  Slight pullback of the of the ETT is recommended by 1 cm. 3. No acute pulmonary disease.   TTE 06/20/2017  Left ventricle: The cavity size was normal. Wall thickness was   normal. Systolic function was mildly reduced. The estimated   ejection fraction was in the range of 45% to 50%. There is   akinesis of the apical myocardium. Doppler parameters are   consistent with abnormal left ventricular relaxation (grade 1   diastolic dysfunction).     PHYSICAL EXAM Elderly Caucasian lady  . Marland Kitchen Afebrile. Head is nontraumatic. Neck is supple without bruit.    Cardiac exam no murmur or gallop. Lungs are clear to auscultation. Distal pulses are well felt. Neurological Exam :  Intubated. Awake alert. Aphasic and will not follow any commands consistently but will mimic.Left gaze preference. Blinks to threat on the right Pupils irregular reactive. Fundi not visualized. Right lower facial weakness. Tongue midline. Motor system exam shows right hemiparesis 3/5 strength with trace withdrawal right upper extremity and modest withdrawal in the right lower extremityr Purposeful antigravity  spontaneous movements of left side. DTRs depressed on right and normal on left side. Rt Plantar upgoing and left downgoing. ASSESSMENT/PLAN Ms. ELARIA OSIAS is a 83 y.o. female with history of history of afib, not on anti-coagulants who presents with right sided weakness.  She is status post IV tPA and IR mechanical thrombectomy.  Stroke: acute/subacute L MCA infarct, involving L insular cortex, L frontal operculum, L posterior temporal and parietal lobe, and more anterior L superior frontal lobe, as well as acute infarct R parietal lobe and Rcentrum semiovale, embolic, in the setting of atheorsclerosis.  Resultant  Aphasia and right hemiplegia  CT head: no acute stroke.  Prominent right infraorbital and lateral soft tissue hematoma without underlying fracture or intraorbital extension  MRI head: acute/subacute L MCA infarct, involving the left insular cortex, left frontal operculum, posterior left temporal and parietal lobe, and more anterior superior left frontal lobe.  Focal acute nonhemorrhagic infarct in the right parietal lobe and centrum semiovale.  MRA head: High-grade L M1 stenosis.  Ill-defined area of restricted diffusion within white matter of the left centrum semi ovale may reflect acute ischemia 2D Echo  Left ventricle: The cavity size was normal. Wall thickness was   normal. Systolic function was mildly reduced. The estimated   ejection fraction was in the range of 45% to 50%. There is   akinesis of the apical myocardium. Doppler parameters are   consistent with abnormal left ventricular relaxation (grade 1    diastolic dysfunction).   LDL 64  HgbA1c 5.4  SCDs for VTE prophylaxis Diet NPO time specified  aspirin 81 mg daily prior to admission, now on aspirin 300 mg suppository daily  Patient counseled to be compliant with her antithrombotic medications  Ongoing aggressive stroke risk factor management  Therapy recommendations:   pending  Disposition:   pending  Hypertension  Unstable  Nicardipine gtt yesterday.  Now off.  Permissive hypertension (OK if < 220/120) but gradually normalize in 5-7 days  Long-term BP goal normotensive  Hyperlipidemia  Home meds: Rosuvastatin 5 mg daily, resumed in hospital  LDL 64, goal < 70  Continue statin at discharge  Other Stroke Risk Factors  Advanced age  Obesity, Body mass index is 32.25 kg/m., recommend weight loss, diet and exercise as appropriate   Coronary artery disease  Other Active Problems  Right groin pseudoaneurysm with failed treatment with thrombin injection requiring emergency Repair of right common femoral  artery pseudoaneurysm by vascular surgery on 06/23/17  Hospital day # 4  I have personally examined this patient, reviewed notes, independently viewed imaging studies, participated in medical decision making and plan of care.ROS completed by me personally and pertinent positives fully documented  I have made any additions or clarifications directly to the above note. She presented with left M1 occlusion and got worse after iv TPA requring emergent mechanical thrombectomy but remains aphasic with hemiplegia. Recommend  Extubate today and then check swallow eval.Continue aspirin mobilize out of bed as tolerated. Long d/w patient, husband, daughter and answered questions. This patient is critically ill and at significant risk of neurological worsening, death and care requires constant monitoring of vital signs, hemodynamics,respiratory and cardiac monitoring, extensive review of multiple databases, frequent neurological assessment, discussion with family, other specialists and medical decision making of high complexity.I have made any additions or clarifications directly to the above note.This critical care time does not reflect procedure time, or teaching time or supervisory time of PA/NP/Med Resident etc but could involve care discussion time.  I spent 30 minutes of neurocritical  care time  in the care of  this patient.      Antony Contras, MD Medical Director [place] Pager: [phone] 06/24/2017 3:56 PM   To contact Stroke Continuity provider, please refer to [email or web address] After hours, contact General Neurology

## 2017-06-24 NOTE — Progress Notes (Signed)
SLP Cancellation Note  Patient Details Name: TYTIONNA CLOYD MRN: [id number] DOB: 1929/11/20   Cancelled treatment:       Reason Eval/Treat Not Completed: Medical issues which prohibited therapy.  Patient intubated for procedure 9/5. Being extubated today per CCM. Plan for MBS in am 9/7, will f/u at bedside first to ensure readiness.   [place], [place] [phone]    Arrie Borrelli Meryl 06/24/2017, 9:21 AM

## 2017-06-25 ENCOUNTER — Inpatient Hospital Stay (HOSPITAL_COMMUNITY): Payer: Medicare Other

## 2017-06-25 DIAGNOSIS — I6300 Cerebral infarction due to thrombosis of unspecified precerebral artery: Secondary | ICD-10-CM

## 2017-06-25 DIAGNOSIS — I639 Cerebral infarction, unspecified: Secondary | ICD-10-CM

## 2017-06-25 LAB — BASIC METABOLIC PANEL
ANION GAP: 6 (ref 5–15)
BUN: 12 mg/dL (ref 6–20)
CALCIUM: 9.2 mg/dL (ref 8.9–10.3)
CO2: 24 mmol/L (ref 22–32)
CREATININE: 0.49 mg/dL (ref 0.44–1.00)
Chloride: 111 mmol/L (ref 101–111)
Glucose, Bld: 98 mg/dL (ref 65–99)
Potassium: 3.5 mmol/L (ref 3.5–5.1)
SODIUM: 141 mmol/L (ref 135–145)

## 2017-06-25 LAB — GLUCOSE, CAPILLARY
GLUCOSE-CAPILLARY: 102 mg/dL — AB (ref 65–99)
GLUCOSE-CAPILLARY: 111 mg/dL — AB (ref 65–99)
GLUCOSE-CAPILLARY: 90 mg/dL (ref 65–99)
Glucose-Capillary: 93 mg/dL (ref 65–99)
Glucose-Capillary: 107 mg/dL — ABNORMAL HIGH (ref 65–99)

## 2017-06-25 MED ORDER — CLOBETASOL PROPIONATE 0.05 % EX OINT: 1.0000 "application " | TOPICAL_OINTMENT | Freq: Two times a day (BID) | CUTANEOUS | Status: DC | PRN

## 2017-06-25 MED ORDER — FUROSEMIDE 10 MG/ML IJ SOLN
40.0000 mg | Freq: Two times a day (BID) | INTRAMUSCULAR | Status: AC
  Administered 2017-06-25 – 2017-06-26 (×2): 40 mg via INTRAVENOUS
  Filled 2017-06-25: qty 4

## 2017-06-25 MED ORDER — ROSUVASTATIN CALCIUM 10 MG PO TABS
10.0000 mg | ORAL_TABLET | ORAL | Status: DC
  Administered 2017-06-28: 10 mg via ORAL
  Filled 2017-06-25: qty 1

## 2017-06-25 MED ORDER — QUETIAPINE FUMARATE 25 MG PO TABS
12.5000 mg | ORAL_TABLET | Freq: Every day | ORAL | Status: DC
  Administered 2017-06-25: 12.5 mg via ORAL
  Filled 2017-06-25: qty 1

## 2017-06-25 MED ORDER — ASPIRIN 81 MG PO CHEW
81.0000 mg | CHEWABLE_TABLET | Freq: Every day | ORAL | Status: DC
  Administered 2017-06-26 – 2017-06-28 (×3): 81 mg via ORAL
  Filled 2017-06-25 (×3): qty 1

## 2017-06-25 MED ORDER — MONTELUKAST SODIUM 10 MG PO TABS
10.0000 mg | ORAL_TABLET | Freq: Every day | ORAL | Status: DC
  Administered 2017-06-26 – 2017-06-28 (×3): 10 mg via ORAL
  Filled 2017-06-25 (×4): qty 1

## 2017-06-25 MED ORDER — CHLORHEXIDINE GLUCONATE CLOTH 2 % EX PADS
6.0000 | MEDICATED_PAD | Freq: Every day | CUTANEOUS | Status: DC
  Administered 2017-06-25: 6 via TOPICAL

## 2017-06-25 MED ORDER — SERTRALINE HCL 50 MG PO TABS
100.0000 mg | ORAL_TABLET | Freq: Every day | ORAL | Status: DC
  Administered 2017-06-26 – 2017-06-28 (×3): 100 mg via ORAL
  Filled 2017-06-25 (×3): qty 2

## 2017-06-25 MED ORDER — HYDRALAZINE HCL 20 MG/ML IJ SOLN
5.0000 mg | Freq: Once | INTRAMUSCULAR | Status: AC
Start: 2017-06-25 — End: 2017-06-25
  Administered 2017-06-25: 5 mg via INTRAVENOUS
  Filled 2017-06-25: qty 1

## 2017-06-25 MED ORDER — ADULT MULTIVITAMIN W/MINERALS CH
1.0000 | ORAL_TABLET | Freq: Every day | ORAL | Status: DC
  Administered 2017-06-26 – 2017-06-27 (×2): 1 via ORAL
  Filled 2017-06-25 (×2): qty 1

## 2017-06-25 MED ORDER — LOSARTAN POTASSIUM 50 MG PO TABS: 100.0000 mg | ORAL_TABLET | Freq: Every day | ORAL | Status: DC

## 2017-06-25 MED ORDER — BIOTIN 1000 MCG PO TABS: 1000.0000 ug | ORAL_TABLET | Freq: Every day | ORAL | Status: DC

## 2017-06-25 MED ORDER — FUROSEMIDE 10 MG/ML IJ SOLN
INTRAMUSCULAR | Status: AC
  Filled 2017-06-25: qty 4

## 2017-06-25 MED ORDER — VITAMIN D 1000 UNITS PO TABS
1000.0000 [IU] | ORAL_TABLET | Freq: Every day | ORAL | Status: DC
  Administered 2017-06-26 – 2017-07-03 (×6): 1000 [IU] via ORAL
  Filled 2017-06-25 (×7): qty 1

## 2017-06-25 MED ORDER — VITAMIN E 180 MG (400 UNIT) PO CAPS
400.0000 [IU] | ORAL_CAPSULE | Freq: Every day | ORAL | Status: DC
  Filled 2017-06-25 (×4): qty 1

## 2017-06-25 MED ORDER — BUPROPION HCL ER (SR) 150 MG PO TB12
150.0000 mg | ORAL_TABLET | Freq: Every day | ORAL | Status: DC
  Administered 2017-06-26 – 2017-06-27 (×2): 150 mg via ORAL
  Filled 2017-06-25 (×3): qty 1

## 2017-06-25 MED ORDER — CLOPIDOGREL BISULFATE 75 MG PO TABS
75.0000 mg | ORAL_TABLET | Freq: Every day | ORAL | Status: DC
  Administered 2017-06-26 – 2017-06-28 (×3): 75 mg via ORAL
  Filled 2017-06-25 (×3): qty 1

## 2017-06-25 MED ORDER — CARVEDILOL 6.25 MG PO TABS
6.2500 mg | ORAL_TABLET | Freq: Two times a day (BID) | ORAL | Status: DC
  Administered 2017-06-30 – 2017-07-03 (×6): 6.25 mg via ORAL
  Filled 2017-06-25 (×2): qty 1
  Filled 2017-06-25 (×2): qty 2
  Filled 2017-06-25 (×2): qty 1
  Filled 2017-06-25: qty 2
  Filled 2017-06-25: qty 1

## 2017-06-25 MED ORDER — ORAL CARE MOUTH RINSE: 15.0000 mL | Freq: Two times a day (BID) | OROMUCOSAL | Status: DC

## 2017-06-25 MED ORDER — CALCIUM CARBONATE-VITAMIN D 500-200 MG-UNIT PO TABS
1.0000 | ORAL_TABLET | Freq: Every day | ORAL | Status: DC
  Administered 2017-06-26 – 2017-06-28 (×3): 1 via ORAL
  Filled 2017-06-25 (×4): qty 1

## 2017-06-25 MED ORDER — NITROGLYCERIN 0.4 MG SL SUBL: 0.4000 mg | SUBLINGUAL_TABLET | SUBLINGUAL | Status: DC | PRN

## 2017-06-25 MED ORDER — CHLORHEXIDINE GLUCONATE 0.12 % MT SOLN
15.0000 mL | Freq: Two times a day (BID) | OROMUCOSAL | Status: DC
  Administered 2017-06-25: 15 mL via OROMUCOSAL
  Filled 2017-06-25 (×2): qty 15

## 2017-06-25 MED ORDER — HALOPERIDOL LACTATE 5 MG/ML IJ SOLN: 1.0000 mg | Freq: Once | INTRAMUSCULAR | Status: DC

## 2017-06-25 NOTE — Progress Notes (Signed)
Spoke with Dr. Titus Mould about UE arterial Doppler order. Confirmed this needs to be changed to venous order for BUE swelling.  Landry Mellow, RDMS, RVT Vascular lab

## 2017-06-25 NOTE — Progress Notes (Signed)
eLink Physician-Brief Progress Note Patient Name: Melinda Noble DOB: [birth year] MRN: [id number]   Date of Service  06/25/2017  HPI/Events of Note  83 yr old female with CVA extubated today now with resp distress.  CXR shows bilateral effusions and edema.  Dr Leonel Ramsay has talked to family who states for now they wish patient to be intubated if needed.  Osat 96% on NRB mask but work of breathing significant.  eICU Interventions  Lasix 40 mg IV bid.  First dose now. If does not improve will need intubation     Intervention Category Major Interventions: Respiratory failure - evaluation and management  Mauri [place], P 06/25/2017, 10:59 PM

## 2017-06-25 NOTE — Progress Notes (Signed)
STROKE TEAM PROGRESS NOTE   HISTORY OF PRESENT ILLNESS (per record)  CC: Right sided weakness  History is obtained from:patient  HPI: Melinda Noble is a 83 y.o. female with a history of afib, not on anti-coagulants who presents with right sided weakness. She was initially plegic on the right, but by the time of arrival to the ED, was having significant improvement.  Her husband states that he spoke with her while he was getting out of bed and she was normal, she then was up getting ready when he heard her fall in the bathroom, and found her on the floor with a large R periorbital hematoma, right-sided weakness, and slurred speech.  Initial NIHSS 8.  Administered tPA bolus at 0941 on 06/20/2017. Stat MRI with high-grade stenosis or occlusion of the distal left M1 segment with nonvisualization of distal MCA branch vessels, became dyspneic, and was taken emergently to IR and intubated.  Per IR, "[w]ith x 2 passes with solitaire 64mm x 40 mm FR retriever device achieved a TICI 2 b reperfusion".      LKW: 6:30 am on 06/21/2017 tpa given?: yes Modified Rankin Score: 0 VAN: positive   Patient was administered IV t-PA at 0941 ib 06/20/2017. She was admitted to the neuro ICU for further evaluation and treatment.   SUBJECTIVE (INTERVAL HISTORY) Her  Husband and  daughter are  at the bedside.   . She is awake  and remains aphasic but can follow occasional midline commands.Plan to do modified barium swallow later today,Family inform me that gets agitated and confused at night and is perhaps sundowning. OBJECTIVE Temp:  [98.3 F (36.8 C)-100.7 F (38.2 C)] 98.6 F (37 C) (09/07 1600) Pulse Rate:  [73-178] 73 (09/07 1530) Cardiac Rhythm: Normal sinus rhythm (09/07 1700) Resp:  [14-26] 14 (09/07 1530) BP: (95-135)/(48-92) 118/78 (09/07 1530) SpO2:  [88 %-99 %] 99 % (09/07 1530) Arterial Line BP: (128-174)/(52-68) 174/68 (09/07 0700)  CBC:   Recent Labs Lab 06/20/17 0819  06/21/17 0349   06/23/17 2335 06/24/17 0410  WBC 6.7  --  13.8*  < > 8.8 7.1  NEUTROABS 4.3  --  11.1*  --   --   --   HGB 13.3  < > 11.9*  < > 9.1* 8.9*  HCT 41.6  < > 37.4  < > 28.2* 27.0*  MCV 94.5  --  94.7  < > 91.0 90.6  PLT 157  --  174  < > 93* 78*  < > = values in this interval not displayed.  Basic Metabolic Panel:   Recent Labs Lab 06/23/17 0224  06/24/17 0410 06/25/17 1256  NA 139  < > 138 141  K 4.1  < > 3.8 3.5  CL 111  --  113* 111  CO2 23  --  20* 24  GLUCOSE 126*  --  98 98  BUN 15  --  14 12  CREATININE 0.65  --  0.55 0.49  CALCIUM 8.8*  --  8.8* 9.2  MG 2.0  --  1.8  --   PHOS 2.5  --  2.1*  2.1*  --   < > = values in this interval not displayed.  Lipid Panel:     Component Value Date/Time   CHOL 133 06/21/2017 0349   CHOL 152 01/26/2017 0855   TRIG 121 06/21/2017 0349   HDL 45 06/21/2017 0349   HDL 56 01/26/2017 0855   CHOLHDL 3.0 06/21/2017 0349   VLDL 24 06/21/2017 0349  [place] 64 06/21/2017 0349   LDLCALC 78 01/26/2017 0855   HgbA1c:  Lab Results  Component Value Date   HGBA1C 5.4 06/21/2017   Urine Drug Screen:     Component Value Date/Time   LABOPIA NONE DETECTED 06/20/2017 2201   COCAINSCRNUR NONE DETECTED 06/20/2017 2201   LABBENZ POSITIVE (A) 06/20/2017 2201   AMPHETMU NONE DETECTED 06/20/2017 2201   THCU NONE DETECTED 06/20/2017 2201   LABBARB NONE DETECTED 06/20/2017 2201    Alcohol Level No results found for: [place] Head Code Stroke Wo Contrast 06/20/2017 IMPRESSION: 1. No significant interval change. 2. Stable atrophy and white matter disease. 3. No definite hyperdense vessel. 4. Stable right periorbital hematoma. 5. ASPECTS is 10/10 These results were called by telephone at the time of interpretation on 06/20/2017 at 10:05 am to Dr. Roland Rack , who verbally acknowledged these results.  Ct Head Code Stroke Wo Contrast 06/20/2017 IMPRESSION: 1. Stable atrophy and white matter disease. 2. No acute intracranial  abnormality or significant interval change. 3. Prominent right infraorbital and lateral soft tissue hematoma without underlying fracture or intraorbital extension. 4. ASPECTS is 10/10  Ct Head Wo Contrast 06/20/2017 IMPRESSION: 1. Focal density posteriorly in the left sylvian fissure may reflect contrast associated with a more distal occlusion. 2. No acute hemorrhage. 3. No evidence for progressing infarct. 4. Stable atrophy and white matter disease. 5. Increasing size of right infraorbital hematoma.   Ct Head Wo Contrast 06/21/2017 IMPRESSION: 1. New left posterolateral temporal lobe acute infarction. 2. Stable infarction centered in left frontal centrum semiovale extending into left superior lentiform nucleus and insula in comparison with prior CT/MRI head. Interval increase in edema and local mass effect associated with the infarct. 3. No acute intracranial hemorrhage. These results will be called to the ordering clinician or representative by the Radiologist Assistant, and communication documented in the PACS or zVision Dashboard.   Ct Maxillofacial Wo Contrast 06/20/2017 IMPRESSION: 1. No maxillofacial fracture. 2. Right infraorbital and periorbital soft tissue hematoma.   [name] Head Wo Contrast 06/20/2017 IMPRESSION: 1. High-grade stenosis or occlusion of the distal left M1 segment with nonvisualization of distal MCA branch vessels. This corresponds with the patient's acute symptoms, likely representing an emergent large vessel occlusion. 2. Narrowing of the right M1 segment and attenuation of MCA branch vessels likely related to atherosclerotic disease. 3. Posterior circulation is intact. 4. Ill-defined area of restricted diffusion within white matter of the left centrum semi ovale may reflect acute ischemia. No definite cortical infarct is present.  Mr Brain Wo Contrast 06/21/2017 IMPRESSION: 1. Increased size of acute/subacute left MCA territory infarct, now involving the left insular cortex,  left frontal operculum, posterior left temporal and parietal lobe, and more anterior superior left frontal lobe. 2. No evidence for hemorrhagic conversion of left-sided infarcts. 3. Focal acute nonhemorrhagic infarct in the right parietal lobe and centrum semiovale. Bilateral disease raises concern for a central cardio embolic source. 4. Right periorbital hematoma.   Dg Chest Port 1 View 06/20/2017 IMPRESSION: Endotracheal tube in good position Cardiomegaly and mild central venous congestion.  Dg Chest Port 1 View 06/20/2017 IMPRESSION: 1. Stable cardiomegaly.  Aortic atherosclerosis with mild uncoiling. 2. Slightly low-lying endotracheal tube at 2.6 cm above the carina. Slight pullback of the of the ETT is recommended by 1 cm. 3. No acute pulmonary disease.   TTE 06/20/2017  Left ventricle: The cavity size was normal. Wall thickness was   normal. Systolic function was mildly reduced. The estimated  ejection fraction was in the range of 45% to 50%. There is   akinesis of the apical myocardium. Doppler parameters are   consistent with abnormal left ventricular relaxation (grade 1   diastolic dysfunction).     PHYSICAL EXAM Elderly Caucasian lady  . Marland Kitchen Afebrile. Head is nontraumatic. Neck is supple without bruit.    Cardiac exam no murmur or gallop. Lungs are clear to auscultation. Distal pulses are well felt. Neurological Exam :    Awake alert. Aphasic and will not follow any commands consistently but will mimic.Left gaze preference. Blinks to threat on the right Pupils irregular reactive. Fundi not visualized. Right lower facial weakness. Tongue midline. Motor system exam shows right hemiparesis 3/5 strength with trace withdrawal right upper extremity and modest withdrawal in the right lower extremityr Purposeful antigravity spontaneous movements of left side. DTRs depressed on right and normal on left side. Rt Plantar upgoing and left downgoing. ASSESSMENT/PLAN Ms. Melinda Noble is a 83 y.o.  female with history of history of afib, not on anti-coagulants who presents with right sided weakness.  She is status post IV tPA and IR mechanical thrombectomy.  Stroke: acute/subacute L MCA infarct, involving L insular cortex, L frontal operculum, L posterior temporal and parietal lobe, and more anterior L superior frontal lobe, as well as acute infarct R parietal lobe and Rcentrum semiovale, embolic, in the setting of atheorsclerosis.  Resultant  Aphasia and right hemiplegia  CT head: no acute stroke.  Prominent right infraorbital and lateral soft tissue hematoma without underlying fracture or intraorbital extension  MRI head: acute/subacute L MCA infarct, involving the left insular cortex, left frontal operculum, posterior left temporal and parietal lobe, and more anterior superior left frontal lobe.  Focal acute nonhemorrhagic infarct in the right parietal lobe and centrum semiovale.  MRA head: High-grade L M1 stenosis.  Ill-defined area of restricted diffusion within white matter of the left centrum semi ovale may reflect acute ischemia 2D Echo  Left ventricle: The cavity size was normal. Wall thickness was   normal. Systolic function was mildly reduced. The estimated   ejection fraction was in the range of 45% to 50%. There is   akinesis of the apical myocardium. Doppler parameters are   consistent with abnormal left ventricular relaxation (grade 1    diastolic dysfunction).   LDL 64  HgbA1c 5.4  SCDs for VTE prophylaxis Diet NPO time specified  aspirin 81 mg daily prior to admission, now on aspirin 300 mg suppository daily  Patient counseled to be compliant with her antithrombotic medications  Ongoing aggressive stroke risk factor management  Therapy recommendations:   pending  Disposition:  pending  Hypertension  Unstable  Nicardipine gtt yesterday.  Now off.  Permissive hypertension (OK if < 220/120) but gradually normalize in 5-7 days  Long-term BP goal  normotensive  Hyperlipidemia  Home meds: Rosuvastatin 5 mg daily, resumed in hospital  LDL 64, goal < 70  Continue statin at discharge  Other Stroke Risk Factors  Advanced age  Obesity, Body mass index is 32.25 kg/m., recommend weight loss, diet and exercise as appropriate   Coronary artery disease  Other Active Problems  Right groin pseudoaneurysm with failed treatment with thrombin injection requiring emergency Repair of right common femoral artery pseudoaneurysm by vascular surgery on 06/23/17  Hospital day # 5  I have personally examined this patient, reviewed notes, independently viewed imaging studies, participated in medical decision making and plan of care.ROS completed by me personally and pertinent positives fully documented  I have made any additions or clarifications directly to the above note. She presented with left M1 occlusion and got worse after iv TPA requring emergent mechanical thrombectomy but remains aphasic with hemiplegia. Recommend  Extubate today and then check swallow eval.Continue aspirin mobilize out of bed as tolerated. Long d/w patient, husband, daughter and answered questions.Plan check swallow eval today and if fails will need Panda tube feedings. Husband also wants palliative care meeting This patient is critically ill and at significant risk of neurological worsening, death and care requires constant monitoring of vital signs, hemodynamics,respiratory and cardiac monitoring, extensive review of multiple databases, frequent neurological assessment, discussion with family, other specialists and medical decision making of high complexity.I have made any additions or clarifications directly to the above note.This critical care time does not reflect procedure time, or teaching time or supervisory time of PA/NP/Med Resident etc but could involve care discussion time.  I spent 30 minutes of neurocritical care time  in the care of  this patient.      Antony Contras, MD Medical Director [place] Pager: [phone] 06/25/2017 8:01 PM   To contact Stroke Continuity provider, please refer to [email or web address] After hours, contact General Neurology

## 2017-06-25 NOTE — Care Management Note (Signed)
Case Management Note  Patient Details  Name: Melinda Noble MRN: [id number] Date of Birth: 1930/06/25  Subjective/Objective:                 Patient admitted from [facility]. Sx 9/5 Repair of right common femoral artery pseudoaneurysm, +CVA.    Action/Plan:  Anticipate SNF at [facility] [place], CSW following.  Expected Discharge Date:                  Expected Discharge Plan:  Skilled Nursing Facility  In-House Referral:  Clinical Social Work  Discharge planning Services  CM Consult  Post Acute Care Choice:    Choice offered to:     DME Arranged:    DME Agency:     HH Arranged:    [place] Agency:     Status of Service:  In process, will continue to follow  If discussed at Long Length of Stay Meetings, dates discussed:    Additional Comments:  Carles Collet, RN 06/25/2017, 10:12 AM

## 2017-06-25 NOTE — Progress Notes (Signed)
Referring Physician(s): Dr Royal Hawthorn  Supervising Physician: Luanne Bras  Patient Status:  [facility] - In-pt  Chief Complaint:  MCA CVA  Subjective:  06/20/2017: S/P bilateral common carotid arteriograms followed byendovascular revascularization of occluded Lt MCA  With x 2 passes with solitaire 86mm x 40 mm FR retriever device achieving a TICI 2 b reperfusion  9/4: right groin hematoma; +pseudoaneurysm Operative repair  Healing now; still unable to communicate with coherency Moves all 4s-- not to command  Allergies: Ace inhibitors; Beta adrenergic blockers; Iohexol; Atorvastatin; Latex; Penicillins; and Sulfa antibiotics  Medications: Prior to Admission medications   Medication Sig Start Date End Date Taking? Authorizing Provider  acetaminophen (TYLENOL) 325 MG tablet Take 650 mg by mouth every 6 (six) hours as needed for mild pain.   Yes [provider]  ALPRAZolam (XANAX) 0.25 MG tablet TAKE (1) TABLET TWICE DAILY AS NEEDED FOR ANXIETY. Patient taking differently: TAKE HALF TABLET (0.125mg ) TWICE DAILY AS NEEDED FOR ANXIETY. 04/19/17  Yes Reed, Tiffany L, DO  aspirin 81 MG chewable tablet Chew 1 tablet (81 mg total) by mouth daily. 10/17/13  Yes Lyda Jester M, PA-C  Biotin 1000 MCG tablet Take 1,000 mcg by mouth daily.   Yes [provider]  buPROPion (WELLBUTRIN SR) 150 MG 12 hr tablet Take 150 mg by mouth every morning.  08/22/16  Yes [provider]  calcium-vitamin D (OSCAL WITH D) 500-200 MG-UNIT per tablet Take 1 tablet by mouth daily.    Yes [provider]  carvedilol (COREG) 6.25 MG tablet Take 1 tablet (6.25 mg total) by mouth 2 (two) times daily with a meal. 04/09/14  Yes Leonie Man, MD  cholecalciferol (VITAMIN D) 1000 UNITS tablet Take 1,000 Units by mouth daily.   Yes [provider]  clobetasol ointment (TEMOVATE) 6.28 % Apply 1 application topically 2 (two) times daily as needed. (affected areas)--also  thighs 07/29/16  Yes Reed, Tiffany L, DO  clopidogrel (PLAVIX) 75 MG tablet Take 75 mg by mouth daily.   Yes [provider]  fexofenadine (ALLEGRA) 60 MG tablet Take 60 mg by mouth 2 (two) times daily as needed for allergies or rhinitis.   Yes [provider]  furosemide (LASIX) 20 MG tablet TAKE 1/2 TABLET DAILY. Patient taking differently: TAKE 1/2 TABLET (10mg ) DAILY. 05/03/17  Yes Reed, Tiffany L, DO  losartan (COZAAR) 100 MG tablet Take 1 tablet (100 mg total) by mouth daily. 12/30/16 06/21/17 Yes Belva Crome, MD  montelukast (SINGULAIR) 10 MG tablet Take 10 mg by mouth at bedtime.   Yes [provider]  Multiple Vitamin (MULTIVITAMIN WITH MINERALS) TABS tablet Take 1 tablet by mouth daily.   Yes [provider]  nitroGLYCERIN (NITROSTAT) 0.4 MG SL tablet Place 0.4 mg under the tongue every 5 (five) minutes as needed for chest pain.   Yes [provider]  omeprazole (PRILOSEC) 20 MG capsule Take 20 mg by mouth daily.    Yes [provider]  Polyethyl Glycol-Propyl Glycol (SYSTANE OP) Apply 1 drop to eye daily as needed (dry eyes).   Yes [provider]  rosuvastatin (CRESTOR) 5 MG tablet Take 10 mg by mouth 3 (three) times a week. (Mondays, Wednesdays, and Fridays)   Yes [provider]  sertraline (ZOLOFT) 100 MG tablet Take 100 mg by mouth daily.  07/06/16  Yes [provider]  SYNTHROID 100 MCG tablet TAKE 1 TABLET IN THE MORNING ON AN EMPTY STOMACH. Patient taking differently: TAKE 1  TABLET (150mcg) IN THE MORNING ON AN EMPTY STOMACH. 05/17/17  Yes Reed, Tiffany L, DO  vitamin E (VITAMIN E) 400 UNIT capsule Take 400 Units by mouth daily.   Yes [provider]     Vital Signs: BP (!) 135/92   Pulse (!) 178   Temp 98.4 F (36.9 C) (Axillary)   Resp (!) 25   Ht 5\' 8"  (1.727 m)   Wt 212 lb 1.3 oz (96.2 kg)   SpO2 97%   BMI 32.25 kg/m   Physical Exam  Cardiovascular: Normal rate and regular  rhythm.   Pulmonary/Chest: Effort normal and breath sounds normal.  Abdominal: Soft.  Neurological:  Awake Alert Unable to respond to me appropriately Speech is incoherent Moves arms - not to command     Skin: Skin is warm.  Right groin is clean and dry Soft No hematoma Rt foot 1+ pulses  Nursing note and vitals reviewed.   Imaging: Dg Abd 1 View  Result Date: 06/23/2017 CLINICAL DATA:  Central venous catheter in place. Chest tube placement. Feeding tube placement. EXAM: ABDOMEN - 1 VIEW COMPARISON:  06/14/2014 FINDINGS: An enteric tube has been placed with tip in the left mid abdomen consistent with location in the distal body of the stomach. Scattered gas and stool in the colon. No small or large bowel distention. Calcifications in the right upper quadrant likely representing gallstones. Degenerative changes in the lumbar spine. IMPRESSION: Enteric tube tip localizes to the left mid abdomen consistent with location in the distal body of the stomach. Cholelithiasis. Nonobstructive bowel gas pattern. Electronically Signed   By: Lucienne Capers M.D.   On: 06/23/2017 22:52   Ct Head Wo Contrast  Result Date: 06/22/2017 CLINICAL DATA:  Altered level consciousness.  Followup infarcts. EXAM: CT HEAD WITHOUT CONTRAST TECHNIQUE: Contiguous axial images were obtained from the base of the skull through the vertex without intravenous contrast. COMPARISON:  MRI of the head June 21, 2017 and CT HEAD June 21, 2017. FINDINGS: BRAIN: No intraparenchymal hemorrhage, mass effect nor midline shift. Evolving LEFT frontotemporal and parietal lobe nonhemorrhagic infarct with mild sulcal effacement. No midline shift. Patchy hypodensities LEFT basal ganglia corresponding areas of acute infarction. No hydrocephalus. No abnormal extra-axial fluid collections. VASCULAR: Mild calcific atherosclerosis of the carotid siphons. SKULL: No skull fracture. No significant scalp soft tissue swelling. SINUSES/ORBITS:  Trace paranasal sinus mucosal thickening. Imaged mastoid air cells are well aerated. The included ocular globes and orbital contents are non-suspicious. Status post bilateral ocular lens implants. OTHER: RIGHT facial soft tissue swelling with subcutaneous fat stranding compatible with contusion, present on prior imaging. IMPRESSION: Evolving acute to subacute large LEFT MCA territory infarct without hemorrhagic conversion. No further propagation by CT. Electronically Signed   By: Elon Alas M.D.   On: 06/22/2017 21:38   Mr Brain Wo Contrast  Result Date: 06/21/2017 CLINICAL DATA:  Right hemiplegia and aphasia. The patient remains intubated. EXAM: MRI HEAD WITHOUT CONTRAST TECHNIQUE: Multiplanar, multiecho pulse sequences of the brain and surrounding structures were obtained without intravenous contrast. COMPARISON:  CT head without contrast 06/21/2017 at 4:41 a.m. CT head without contrast 06/20/2017 12:08 p.m. MRI brain and 06/20/2017 at 9:13 a.m. FINDINGS: Brain: Diffusion-weighted images now demonstrate significant extension of left MCA territory infarct. There is diffuse restricted diffusion at the left insular ribbon and a left frontal operculum. There is extensive involvement of the posterior left temporal lobe and parietal lobe. Smaller focal cortical involvement is seen in the anterior left frontal lobe and posterior  left parietal lobe. A focal area of restricted cortical will diffusion is present in the posterior right parietal lobe with scattered foci involving the posterior right centrum semiovale. Vascular: Abnormal signal is present in the proximal posterior left M2 branch suggesting slow or occluded flow. Proximal flow is present in the major intracranial arteries. Skull and upper cervical spine: The skullbase is within normal limits. The craniocervical junction is normal. Marrow signal is normal. The upper cervical spine is unremarkable. Sinuses/Orbits: Mild mucosal thickening is present  within the anterior ethmoid air cells. The paranasal sinuses are otherwise clear. Mild mastoid fluid is now present. There is some fluid in the posterior oropharynx as well. The patient is intubated. Bilateral lens replacements are present. The globes and orbits are within normal limits. Other: Right periorbital hematoma is again noted. IMPRESSION: 1. Increased size of acute/subacute left MCA territory infarct, now involving the left insular cortex, left frontal operculum, posterior left temporal and parietal lobe, and more anterior superior left frontal lobe. 2. No evidence for hemorrhagic conversion of left-sided infarcts. 3. Focal acute nonhemorrhagic infarct in the right parietal lobe and centrum semiovale. Bilateral disease raises concern for a central cardio embolic source. 4. Right periorbital hematoma. Electronically Signed   By: San Morelle M.D.   On: 06/21/2017 12:45   Ir US Guide Bx Asp/drain  Result Date: 06/24/2017 CLINICAL DATA:  83 year old female with a history of emergent large vessel occlusion treated with thrombectomy 06/20/2017. Subsequent pseudoaneurysm of right common femoral artery with hemorrhage 06/23/2017. EXAM: UNILATERAL RIGHT LOWER EXTREMITY ARTERIAL DUPLEX SCAN TECHNIQUE: Gray-scale sonography as well as color Doppler and duplex ultrasound was performed to evaluate the arteries of the lower extremity. COMPARISON:  Vascular study of the same date performed by the vascular lab FINDINGS: Limited grayscale and color duplex imaging of the right inguinal region demonstrates heterogeneous fluid within the soft tissues overlying the femoral artery. Deidre Ala estimate of the diameter, proximally 15 cm, increased from the comparison study of the same day. Greatest depth of the fluid measures approximately 5.0 cm. Evidence of persistent pseudoaneurysm with the neck of the pseudoaneurysm diameter measuring at least 6 mm with short neck identified. IMPRESSION: Enlarging hematoma related to  pseudoaneurysm the right common femoral artery with persisting flow, and neck of the pseudoaneurysm measuring 6 mm. Signed, Dulcy Fanny. Earleen Newport, DO [facility] Radiology Electronically Signed   By: Corrie Mckusick D.O.   On: 06/24/2017 09:44   Dg Chest Port 1 View  Result Date: 06/23/2017 CLINICAL DATA:  Central venous catheter placement. EXAM: PORTABLE CHEST 1 VIEW COMPARISON:  Chest radiograph June 20, 2017 FINDINGS: The cardiac silhouette is mildly enlarged and unchanged. Pulmonary vascular congestion. Patient rotated to the RIGHT accentuating the mediastinal silhouette. Calcified aortic knob. Endotracheal tube tip projects 4.3 cm above the carina. A temperature probe in the esophagus. Nasogastric tube past proximal stomach, distal tip not imaged. RIGHT internal jugular central venous catheter distal tip projects in mid superior vena cava. Small pleural effusions. No pneumothorax. Multiple lines and tubes overlie the patient. Soft tissue planes and included osseous structures are unchanged. IMPRESSION: RIGHT internal jugular central venous catheter distal tip projects in mid superior vena cava. Stable endotracheal tube. Nasogastric tube past proximal stomach. Mild cardiomegaly and pulmonary vascular congestion. Small pleural effusions. Electronically Signed   By: Elon Alas M.D.   On: 06/23/2017 22:52    Labs:  CBC:  Recent Labs  06/21/17 0349 06/23/17 0224 06/23/17 2008 06/23/17 2118 06/23/17 2335 06/24/17 0410  WBC 13.8* 7.8  --   --  8.8 7.1  HGB 11.9* 9.4* 6.1* 7.8* 9.1* 8.9*  HCT 37.4 29.9* 18.0* 23.0* 28.2* 27.0*  PLT 174 108*  --   --  93* 78*    COAGS:  Recent Labs  06/20/17 0819 06/23/17 2335  INR 0.94 1.27  APTT 32 33    BMP:  Recent Labs  06/20/17 0819 06/20/17 0827 06/21/17 0349 06/23/17 0224 06/23/17 2008 06/23/17 2118 06/24/17 0410  NA 138 142 140 139 139 141 138  K 4.1 4.1 3.9 4.1 3.8 4.0 3.8  CL  108 107 113* 111  --   --  113*  CO2 22  --  20* 23  --   --  20*  GLUCOSE 131* 131* 115* 126*  --   --  98  BUN 16 20 15 15   --   --  14  CALCIUM 10.0  --  8.9 8.8*  --   --  8.8*  CREATININE 0.81 0.80 0.75 0.65  --   --  0.55  GFRNONAA >60  --  >60 >60  --   --  >60  GFRAA >60  --  >60 >60  --   --  >60    LIVER FUNCTION TESTS:  Recent Labs  01/26/17 0855 06/20/17 0819 06/21/17 0349 06/24/17 0410  BILITOT 0.5 0.6  --   --   AST 23 26  --   --   ALT 20 23  --   --   ALKPHOS 84 72  --   --   PROT 6.7 6.7  --   --   ALBUMIN 4.2 3.9 3.0* 2.7*    Assessment and Plan:  CVA Revascularization L MCA 9/2 Post rt groin hematoma and pseudoaneurysm---operative repair Moving all 4s--not following commands Speech improving; not with coherency Will follow    Electronically Signed: Sammie Schermerhorn A, PA-C 06/25/2017, 11:40 AM   I spent a total of 25 Minutes at the the patient's bedside AND on the patient's [facility] floor or unit, greater than 50% of which was counseling/coordinating care for L MCA revascularization

## 2017-06-25 NOTE — Progress Notes (Addendum)
PULMONARY / CRITICAL CARE MEDICINE   Name: Melinda [facility] MRN: [id number] DOB: [birth year]    ADMISSION DATE:  06/20/2017 CONSULTATION DATE:  9/2  REFERRING [id number]   CHIEF COMPLAINT:   Vent management post CVA critical care   HISTORY OF PRESENT ILLNESS:   This is a 83 year old female w/ sig h/o atrial fib (not on systemic A/C). Presented to the ER on 9/2 w/ new (R) sided hemiplegia (she was independent w/ ADLs prior to this).   ER course: MRA ->felt trickle flow to left MCA but no areas of infarct.  -received TPA in the ER-->clinically actually declined.  -repeat CT head-->negative for ICH -went to IR: for cerebral arteriogram and endo vascularization.  -returned to the ICU on vent. PCCM asked to a/w care   SUBJECTIVE:  Overnight did well. Converted into rate controlled a fib, E Link notified with decision to monitor. Hypoglycemic overnight, added dextrose to fluids. Extubated 9/6, doing well on 3 L [place]  VITAL SIGNS: BP (!) 125/48   Pulse 78   Temp 99.1 F (37.3 C) (Axillary)   Resp (!) 24   Ht 5\' 8"  (1.727 m)   Wt 212 lb 1.3 oz (96.2 kg)   SpO2 95%   BMI 32.25 kg/m    INTAKE / OUTPUT: I/O last 3 completed shifts: In: 3272.8 [I.V.:2572.8; IV [id number]:562] Out: 3695 [Urine:2270; Drains:195; Other:630; Blood:600]  PHYSICAL EXAMINATION: Gen:      Awake and alert, somewhat agitated, interactive, selectively following commands HEENT:  [place]/AT, PERRL, EOM-I and significant R sided facial bruising/hematoma  Neck:      No masses; no thyromegaly, No LAD Lungs:  Respirations are regular and unlabored, Clear throughout, diminished per bases CV:         Rate controlled atrial fib, S1, S2, no RMG Abd:      Soft, NT, ND and +BS, obese Ext:    -edema and -tenderness Skin:       Warm and dry; no rash, R groin extensive bruising, dressing c/d, JP drain with bloody drainage Neuro:     Moving extremities. Agitated, answering simple questions, following  commands  LABS:  BMET  Recent Labs Lab 06/21/17 0349 06/23/17 0224 06/23/17 2008 06/23/17 2118 06/24/17 0410  NA 140 139 139 141 138  K 3.9 4.1 3.8 4.0 3.8  CL 113* 111  --   --  113*  CO2 20* 23  --   --  20*  BUN 15 15  --   --  14  CREATININE 0.75 0.65  --   --  0.55  GLUCOSE 115* 126*  --   --  98   Electrolytes  Recent Labs Lab 06/21/17 0349 06/23/17 0224 06/24/17 0410  CALCIUM 8.9 8.8* 8.8*  MG 1.7 2.0 1.8  PHOS 2.8 2.5 2.1*  2.1*   CBC  Recent Labs Lab 06/23/17 0224  06/23/17 2118 06/23/17 2335 06/24/17 0410  WBC 7.8  --   --  8.8 7.1  HGB 9.4*  < > 7.8* 9.1* 8.9*  HCT 29.9*  < > 23.0* 28.2* 27.0*  PLT 108*  --   --  93* 78*  < > = values in this interval not displayed. Coag's  Recent Labs Lab 06/20/17 0819 06/23/17 2335  APTT 32 33  INR 0.94 1.27   Sepsis Markers No results for input(s): LATICACIDVEN, PROCALCITON, O2SATVEN in the last 168 hours.  ABG  Recent Labs Lab 06/23/17 2008 06/23/17 2118 06/24/17 0015  PHART 7.378 7.353 7.313*  PCO2ART 32.0 36.8 41.0  PO2ART 94.0 337.0* 120.0*   Liver Enzymes  Recent Labs Lab 06/20/17 0819 06/21/17 0349 06/24/17 0410  AST 26  --   --   ALT 23  --   --   ALKPHOS 72  --   --   BILITOT 0.6  --   --   ALBUMIN 3.9 3.0* 2.7*   Cardiac Enzymes No results for input(s): TROPONINI, PROBNP in the last 168 hours.  Glucose  Recent Labs Lab 06/24/17 1217 06/24/17 1521 06/24/17 2057 06/24/17 2331 06/25/17 0353 06/25/17 0733  GLUCAP 129* 90 98 93 93 102*   Imaging  STUDIES:  CT head 9/2: 1. Focal density posteriorly in the left sylvian fissure may reflect contrast associated with a more distal occlusion. 2. No acute hemorrhage. 3. No evidence for progressing infarct. 4. Stable atrophy and white matter disease. 5. Increasing size of right infraorbital hematoma. MCA 9/2:  High grade stenosis or occlusion of the distal M1 seg w/ nonvisualization of the distal MCA branch   CXR 9/2:  ET tube in good position, mild venous congestion. No infiltrate. CULTURES:   ANTIBIOTICS:   SIGNIFICANT EVENTS: 9/5>> OR for repair of R femoral pseudoaneurysm  LINES/TUBES: ETT 9/2>>>9/4, 9/5>>>9/6  DISCUSSION: Acute left MCA stroke w/ progression in spite of TPA. Now s/p IR for endovascularization. CCM on board for vent management. Extubated 9/6  ASSESSMENT / PLAN:  PULMONARY A: Need for mechanical ventilation for altered mental status.  Reintubated for OR 9/5  abg reviewed PCXR still pending Successfully extubated 9/6, protecting airway P:   Mobilize  Aggressive Pulmonary hygiene  IS as able F/u CXR 9/8    CARDIOVASCULAR A:  Mild HTN  H/o afib Converted to rate controlled a-fib 9/7 early am P:  SBP goal per stroke team (120-140) Cont tele monitoring Holding outpatient aspirin, Crestor, Cozaar, Lasix and Coreg  RENAL A:   No acute  P:   Trend chem  Strict I&O Replete electrolytes as needed   GASTROINTESTINAL A:   Inadequate oral intake due to mental status - resolved.  P:   NPO for now  Speech following  Plan MBS in am 9/7   HEMATOLOGIC A:   No acute w/out evidence of bleeding Thrombocytopenia>> ? Consumptive from hematoma formation Bloody drainage from JP ( 70 cc last pm)  P:  Trend cbc A/C per neuro  Transfuse per ICU protocol  Monitor for any obvious source of bleeding  INFECTIOUS A:   No evidence of infection  P:   Observe off antibiotics Trend fever/ WBC D/c A-line/ Central line   ENDOCRINE A:   Hypothyroidism Hypoglycemic overnight  P:  CBG's Q 4  IV Synthroid Added dextrose to IVF  NEUROLOGIC A:   Left MCA stroke w/ right sided hemiplegia. Now s/p TPA and revascularization in IR P:   Supportive care  Per neuro  Pt/ot   FAMILY  - Updates:  daughter updated at bedside 9/7.  Pt. Has been successfully  extubated  to 3 L [place]. Plan to mobilize and start IS, aggressive pulmonary toilet. CCM will sign off , please  re-consult if we can be of any further assistance.    Magdalen Spatz, AGACNP-BC [facility] 06/25/2017  7:48 AM Pager:  [phone]  STAFF NOTE: Linwood Dibbles, MD FACP have personally reviewed patient's available data, including medical history, events of note, physical examination and test results as part of my evaluation. I have discussed with resident/NP and other care  providers such as pharmacist, RN and RRT. In addition, I personally evaluated patient and elicited key findings of: awake, alert, no distress after extubation, some confusion, slight coarse BS, edema remains, pcxr I reviewed from day prior with int edema and effusions, need to mobilize her as able, she requires IS aggressive, would favor neg balance, need to assess chem and renal fxn this morning, for SLp today she had hypoglycemia event required D5 in fluids until oral intake reliable, follow hct further in am, she suffers from delirium, would NOT provide haldol with seizure risk, continued behavior and redirection, consider transfer sdu, I updated daughter in full, noted fib, rate wnl, anticoagulation on hold for recent pseudoaneurysm and hemorrhagic conversion risk  Lavon Paganini. Titus Mould, MD, [place] Pgr: [facility] 06/25/2017 9:07 AM

## 2017-06-25 NOTE — Progress Notes (Signed)
Called regarding dropping sats and SOB. Sats now increased with NRB. She has clearly increased work of breathing. Her BS are diminished bilaterally, Called CCM to re-evaluate.   I discussed possible need for intubation with family and they indicate that they would likely want it for a temporary need.   1) CXR 2) Hold on floor transfer.  3) Appreciate CCM re-evaluation.   Roland Rack, MD [facility] [phone]  If 7pm- 7am, please page neurology on call as listed in [place].

## 2017-06-25 NOTE — Progress Notes (Addendum)
[facility] [place]  Subjective  - Extubated following commands   Objective (!) 125/48 78 98.4 F (36.9 C) (Axillary) (!) 24 95%  Intake/Output Summary (Last 24 hours) at 06/25/17 0925 Last data filed at 06/25/17 0700  Gross per 24 hour  Intake           763.75 ml  Output             1175 ml  Net          -411.25 ml    Moving all 4 ext. , following some commands Doppler DP/PT B Drain right groin 145 cc last 24h , bloody drainage  Assessment/Planning: POD #2 POD # 1 Repair of right common femoral artery pseudoaneurysm  She is alert and moving all extremities, still mental confusion   will maintain drain.  Laurence Slate [facility] 06/25/2017 9:25 AM -- Responds to questions.  Still dysarthria.  Right groin ecchymosis unchanged, incision intact Will d/c JP when 24 hr output is less than 30 cc.  Ruta Hinds, MD [facility] [place] Office: [phone] Pager: [phone]  Laboratory Lab Results:  Recent Labs  06/23/17 2335 06/24/17 0410  WBC 8.8 7.1  HGB 9.1* 8.9*  HCT 28.2* 27.0*  PLT 93* 78*   BMET  Recent Labs  06/23/17 0224  06/23/17 2118 06/24/17 0410  NA 139  < > 141 138  K 4.1  < > 4.0 3.8  CL 111  --   --  113*  CO2 23  --   --  20*  GLUCOSE 126*  --   --  98  BUN 15  --   --  14  CREATININE 0.65  --   --  0.55  CALCIUM 8.8*  --   --  8.8*  < > = values in this interval not displayed.  COAG Lab Results  Component Value Date   INR 1.27 06/23/2017   INR 0.94 06/20/2017   INR 1.03 11/25/2013   No results found for: PTT

## 2017-06-25 NOTE — Progress Notes (Signed)
Dr. Leonie Man made aware patient is restless and agitated, attempting to climb out of bed, increased HR afib, and has not had cortrak placed. Orders received. Handoff report given to Caryl Pina, Therapist, sports. Nursing to continue to monitor.

## 2017-06-25 NOTE — Progress Notes (Signed)
Modified Barium Swallow Progress Note  Patient Details  Name: Melinda [facility] MRN: [id number] Date of Birth: Jun 09, 1930  Today's Date: 06/25/2017  Modified Barium Swallow completed.  Full report located under Chart Review in the Imaging Section.  Brief recommendations include the following:  Clinical Impression  Pt presents with a moderate-severe sensorimotor oropharyngeal dysphagia c/b poor oral control of POs, diffuse spreading throughout cavity, and uncontrolled spillage of all materials into pharynx.  Purees, honey, and nectar-thick liquids are all aspirated before the swallow.  There is diffuse residue throughout pharynx after the swallow response.  Pt has a delayed cough in reaction to aspiration, but it occurs well after the aspirate has descended into the trachea.  Study was reviewed at length with pt's daughter, Vinnie Level.  For now, continue NPO status with cortrak for nutrition.  After discussion with Dr. Leonie Man, decision made to repeat MBS on Monday, 9/10 to determine improvement, necessity of PEG.    Swallow Evaluation Recommendations       SLP Diet Recommendations: NPO;Alternative means - temporary                       Oral Care Recommendations: Oral care QID        [name] 06/25/2017,2:52 PM

## 2017-06-25 NOTE — Progress Notes (Signed)
Cortrak Tube Team Note:  Consult received to place a Cortrak feeding tube.   A 10 F Cortrak tube was placed in the Right nare and secured with a nasal bridle at 88 cm. Per the Cortrak monitor reading the tube tip is in the gastric antrum.   No x-ray is required. RN may begin using tube.   If the tube becomes dislodged please keep the tube and contact the Cortrak team at [email or web address] (password [email or web address]) for replacement.  If after hours and replacement cannot be delayed, place a NG tube and confirm placement with an abdominal x-ray.   Burtis Junes RD, LDN, CNSC Clinical Nutrition Pager: [phone] 06/25/2017 6:25 PM

## 2017-06-26 ENCOUNTER — Encounter (HOSPITAL_COMMUNITY): Payer: Medicare Other

## 2017-06-26 ENCOUNTER — Inpatient Hospital Stay (HOSPITAL_COMMUNITY): Payer: Medicare Other

## 2017-06-26 DIAGNOSIS — Z789 Other specified health status: Secondary | ICD-10-CM

## 2017-06-26 DIAGNOSIS — Z515 Encounter for palliative care: Secondary | ICD-10-CM

## 2017-06-26 DIAGNOSIS — I480 Paroxysmal atrial fibrillation: Secondary | ICD-10-CM

## 2017-06-26 DIAGNOSIS — J810 Acute pulmonary edema: Secondary | ICD-10-CM

## 2017-06-26 DIAGNOSIS — D72829 Elevated white blood cell count, unspecified: Secondary | ICD-10-CM

## 2017-06-26 DIAGNOSIS — D649 Anemia, unspecified: Secondary | ICD-10-CM

## 2017-06-26 DIAGNOSIS — I63412 Cerebral infarction due to embolism of left middle cerebral artery: Secondary | ICD-10-CM

## 2017-06-26 DIAGNOSIS — I724 Aneurysm of artery of lower extremity: Secondary | ICD-10-CM

## 2017-06-26 LAB — BLOOD GAS, ARTERIAL
Acid-base deficit: 1.9 mmol/L (ref 0.0–2.0)
Bicarbonate: 24.4 mmol/L (ref 20.0–28.0)
Delivery systems: POSITIVE
EXPIRATORY PAP: 6
FIO2: 100
Inspiratory PAP: 12
O2 Saturation: 98.6 %
PO2 ART: 146 mmHg — AB (ref 83.0–108.0)
Patient temperature: 98
pCO2 arterial: 57.6 mmHg — ABNORMAL HIGH (ref 32.0–48.0)
pH, Arterial: 7.249 — ABNORMAL LOW (ref 7.350–7.450)

## 2017-06-26 LAB — CBC
HEMATOCRIT: 28.3 % — AB (ref 36.0–46.0)
Hemoglobin: 9.3 g/dL — ABNORMAL LOW (ref 12.0–15.0)
MCH: 30.7 pg (ref 26.0–34.0)
MCHC: 32.9 g/dL (ref 30.0–36.0)
MCV: 93.4 fL (ref 78.0–100.0)
Platelets: 163 10*3/uL (ref 150–400)
RBC: 3.03 MIL/uL — ABNORMAL LOW (ref 3.87–5.11)
RDW: 15.7 % — AB (ref 11.5–15.5)
WBC: 13 10*3/uL — AB (ref 4.0–10.5)

## 2017-06-26 LAB — BASIC METABOLIC PANEL
ANION GAP: 7 (ref 5–15)
BUN: 15 mg/dL (ref 6–20)
CO2: 24 mmol/L (ref 22–32)
Calcium: 9.6 mg/dL (ref 8.9–10.3)
Chloride: 110 mmol/L (ref 101–111)
Creatinine, Ser: 0.66 mg/dL (ref 0.44–1.00)
GFR calc Af Amer: >60 mL/min (ref >60)
Glucose, Bld: 135 mg/dL — ABNORMAL HIGH (ref 65–99)
POTASSIUM: 3.6 mmol/L (ref 3.5–5.1)
SODIUM: 141 mmol/L (ref 135–145)

## 2017-06-26 LAB — TROPONIN I
TROPONIN I: 1.19 ng/mL — AB (ref <0.03)
Troponin I: 1.61 ng/mL (ref <0.03)

## 2017-06-26 LAB — PHOSPHORUS
PHOSPHORUS: 1.1 mg/dL — AB (ref 2.5–4.6)
Phosphorus: 2.9 mg/dL (ref 2.5–4.6)

## 2017-06-26 LAB — GLUCOSE, CAPILLARY
GLUCOSE-CAPILLARY: 132 mg/dL — AB (ref 65–99)
GLUCOSE-CAPILLARY: 138 mg/dL — AB (ref 65–99)
GLUCOSE-CAPILLARY: 132 mg/dL — AB (ref 65–99)
Glucose-Capillary: 117 mg/dL — ABNORMAL HIGH (ref 65–99)

## 2017-06-26 LAB — POCT I-STAT 3, ART BLOOD GAS (G3+)
Acid-base deficit: 1 mmol/L (ref 0.0–2.0)
Bicarbonate: 24.3 mmol/L (ref 20.0–28.0)
O2 SAT: 100 %
PCO2 ART: 43.8 mmHg (ref 32.0–48.0)
PH ART: 7.351 (ref 7.350–7.450)
PO2 ART: 308 mmHg — AB (ref 83.0–108.0)
TCO2: 26 mmol/L (ref 22–32)

## 2017-06-26 LAB — MAGNESIUM
Magnesium: 1.7 mg/dL (ref 1.7–2.4)
Magnesium: 1.6 mg/dL — ABNORMAL LOW (ref 1.7–2.4)

## 2017-06-26 LAB — TRIGLYCERIDES: TRIGLYCERIDES: 99 mg/dL (ref <150)

## 2017-06-26 MED ORDER — PROPOFOL 1000 MG/100ML IV EMUL
0.0000 ug/kg/min | INTRAVENOUS | Status: DC
  Administered 2017-06-26: 60 mg/h via INTRAVENOUS

## 2017-06-26 MED ORDER — PROPOFOL 1000 MG/100ML IV EMUL
INTRAVENOUS | Status: AC
  Administered 2017-06-26: 60 mg/h via INTRAVENOUS
  Filled 2017-06-26: qty 100

## 2017-06-26 MED ORDER — CHLORHEXIDINE GLUCONATE 0.12% ORAL RINSE (MEDLINE KIT)
15.0000 mL | Freq: Two times a day (BID) | OROMUCOSAL | Status: DC
  Administered 2017-06-26 – 2017-06-29 (×8): 15 mL via OROMUCOSAL

## 2017-06-26 MED ORDER — LEVOFLOXACIN IN D5W 750 MG/150ML IV SOLN
750.0000 mg | INTRAVENOUS | Status: DC
Start: 2017-06-26 — End: 2017-06-30
  Administered 2017-06-26 – 2017-06-29 (×4): 750 mg via INTRAVENOUS
  Filled 2017-06-26 (×5): qty 150

## 2017-06-26 MED ORDER — FENTANYL BOLUS VIA INFUSION
25.0000 ug | INTRAVENOUS | Status: DC | PRN
  Filled 2017-06-26: qty 25

## 2017-06-26 MED ORDER — FENTANYL CITRATE (PF) 100 MCG/2ML IJ SOLN: 50.0000 ug | INTRAMUSCULAR | Status: DC | PRN

## 2017-06-26 MED ORDER — SODIUM CHLORIDE 0.9 % IV BOLUS (SEPSIS)
500.0000 mL | Freq: Once | INTRAVENOUS | Status: AC
  Administered 2017-06-26: 500 mL via INTRAVENOUS

## 2017-06-26 MED ORDER — FENTANYL CITRATE (PF) 100 MCG/2ML IJ SOLN
INTRAMUSCULAR | Status: AC
  Administered 2017-06-26: 100 ug
  Filled 2017-06-26: qty 4

## 2017-06-26 MED ORDER — FENTANYL CITRATE (PF) 100 MCG/2ML IJ SOLN
50.0000 ug | Freq: Once | INTRAMUSCULAR | Status: AC
Start: 2017-06-26 — End: 2017-06-26

## 2017-06-26 MED ORDER — FENTANYL 2500MCG IN NS 250ML (10MCG/ML) PREMIX INFUSION
25.0000 ug/h | INTRAVENOUS | Status: DC
  Administered 2017-06-26: 25 ug/h via INTRAVENOUS
  Administered 2017-06-27: 75 ug/h via INTRAVENOUS
  Administered 2017-06-28: 25 ug/h via INTRAVENOUS
  Filled 2017-06-26 (×3): qty 250

## 2017-06-26 MED ORDER — PRO-STAT SUGAR FREE PO LIQD
30.0000 mL | Freq: Three times a day (TID) | ORAL | Status: DC
  Administered 2017-06-26 – 2017-06-28 (×7): 30 mL
  Filled 2017-06-26 (×8): qty 30

## 2017-06-26 MED ORDER — PRO-STAT SUGAR FREE PO LIQD
30.0000 mL | Freq: Two times a day (BID) | ORAL | Status: DC
  Administered 2017-06-26: 30 mL
  Filled 2017-06-26: qty 30

## 2017-06-26 MED ORDER — ORAL CARE MOUTH RINSE
15.0000 mL | OROMUCOSAL | Status: DC
  Administered 2017-06-26 – 2017-06-29 (×32): 15 mL via OROMUCOSAL

## 2017-06-26 MED ORDER — VITAL HIGH PROTEIN PO LIQD
1000.0000 mL | ORAL | Status: DC
  Administered 2017-06-26 – 2017-06-27 (×2): 1000 mL
  Administered 2017-06-28 (×2)
  Administered 2017-06-28: 1000 mL
  Administered 2017-06-29 (×5)
  Filled 2017-06-26 (×2): qty 1000

## 2017-06-26 MED ORDER — SODIUM CHLORIDE 0.9 % IV SOLN
0.0000 ug/min | INTRAVENOUS | Status: DC
  Administered 2017-06-26: 20 ug/min via INTRAVENOUS
  Administered 2017-06-27: 55 ug/min via INTRAVENOUS
  Administered 2017-06-27 (×2): 50 ug/min via INTRAVENOUS
  Administered 2017-06-27: 45 ug/min via INTRAVENOUS
  Administered 2017-06-27: 55 ug/min via INTRAVENOUS
  Administered 2017-06-28: 20 ug/min via INTRAVENOUS
  Administered 2017-06-28: 10 ug/min via INTRAVENOUS
  Administered 2017-06-28: 40 ug/min via INTRAVENOUS
  Administered 2017-06-29: 50 ug/min via INTRAVENOUS
  Administered 2017-06-29: 40 ug/min via INTRAVENOUS
  Administered 2017-06-29: 50 ug/min via INTRAVENOUS
  Administered 2017-06-29: 30 ug/min via INTRAVENOUS
  Filled 2017-06-26 (×17): qty 1

## 2017-06-26 NOTE — Procedures (Signed)
Intubation Procedure Note Melinda Noble [id number] 06-27-1930  Procedure: Intubation Indications: Respiratory insufficiency  Procedure Details Consent: Risks of procedure as well as the alternatives and risks of each were explained to the (patient/caregiver).  Consent for procedure obtained. Time Out: Verified patient identification, verified procedure, site/side was marked, verified correct patient position, special equipment/implants available, medications/allergies/relevent history reviewed, required imaging and test results available.  Performed  Maximum sterile technique was used including cap, gloves, gown and hand hygiene.  MAC and 3    Evaluation Hemodynamic Status: BP stable throughout; O2 sats: stable throughout Patient's Current Condition: stable Complications: No apparent complications Patient did tolerate procedure well. Chest X-ray ordered to verify placement.  CXR: pending.   Melinda Noble. 06/26/2017  Airway without asp material

## 2017-06-26 NOTE — Progress Notes (Signed)
[facility]  Subjective  - somnolent   Objective (!) 70/36 69 (!) 97.2 F (36.2 C) (Axillary) (!) 22 100%  Intake/Output Summary (Last 24 hours) at 06/26/17 0921 Last data filed at 06/26/17 0700  Gross per 24 hour  Intake              420 ml  Output             1665 ml  Net            -1245 ml   Pt with worsening respiratory status overnight back on ventilator this morning Right groin incision intact JP 115 serosanguinous  Assessment/Planning: S/p right femoral pseudoaneurysm repair.  Drain output still too high for removal Continue current care.  Ruta Hinds 06/26/2017 9:21 AM --  Laboratory Lab Results:  Recent Labs  06/24/17 0410 06/26/17 0228  WBC 7.1 13.0*  HGB 8.9* 9.3*  HCT 27.0* 28.3*  PLT 78* 163   BMET  Recent Labs  06/25/17 1256 06/26/17 0228  NA 141 141  K 3.5 3.6  CL 111 110  CO2 24 24  GLUCOSE 98 135*  BUN 12 15  CREATININE 0.49 0.66  CALCIUM 9.2 9.6    COAG Lab Results  Component Value Date   INR 1.27 06/23/2017   INR 0.94 06/20/2017   INR 1.03 11/25/2013   No results found for: PTT

## 2017-06-26 NOTE — Progress Notes (Signed)
Nutrition Follow-up  DOCUMENTATION CODES:  Obesity unspecified  INTERVENTION:  Initiate TF via NGT with Vital High Protein at goal rate of 40 ml/h (960 ml per day) and Prostat 30 ml TID to provide 1260 kcals, 129 gm protein, 803 ml free water daily.  NUTRITION DIAGNOSIS:  Inadequate oral intake related to inability to eat as evidenced by NPO status.  GOAL:  Provide needs based on ASPEN/SCCM guidelines  MONITOR:  Diet advancement, Supplement acceptance, Weight trends, Labs, Vent status, TF tolerance, I & O's  REASON FOR ASSESSMENT:  Consult Enteral/tube feeding initiation and management  ASSESSMENT:  Pt with PMH of afib, HTN, ischemic cardiomyopathy, CAD, anemia admitted with L MCA stroke with progression despite tPA, now s/p IR for endovascularization.    Intubated 9/2-Extubated 9/4 Intubated 9/5 for procedure-Extubated 9/6  Intubated 9/8  Pt had cortrak placed yesterday evening. Was intubated overnight due to increasing SOB. CXR showed bilateral infusions and edema.   Due to recurrent intubation/extubations and AMS, pt has not had any enteral nutrition since admission 6 days ago.   Has Vital HP infusing. Abdomen is soft, non distended.   Pt has propofol ordered, but, per RN, currently held to hypotension, switched to low rate fentanyl, unsure if to be restarted. Will not account for in TF.   Patient is currently intubated on ventilator support MV: 11.2 L/min Temp (24hrs), Avg:98.7 F (37.1 C), Min:97.2 F (36.2 C), Max:100.3 F (37.9 C)  Labs: WBC: 13.0, BG 90-140 Meds: Calcium + D, Vit D, Prostat, Vital high protein, MVI, PPI, Vit E. D5 @ 20, (82 kcals/day), propofol, IV abx,     Recent Labs Lab 06/23/17 0224  06/24/17 0410 06/25/17 1256 06/26/17 0228 06/26/17 0949  NA 139  < > 138 141 141  --   K 4.1  < > 3.8 3.5 3.6  --   CL 111  --  113* 111 110  --   CO2 23  --  20* 24 24  --   BUN 15  --  14 12 15   --   CREATININE 0.65  --  0.55 0.49 0.66  --    CALCIUM 8.8*  --  8.8* 9.2 9.6  --   MG 2.0  --  1.8  --   --  1.7  PHOS 2.5  --  2.1*  2.1*  --   --  2.9  GLUCOSE 126*  --  98 98 135*  --   < > = values in this interval not displayed.  Diet Order:  Diet NPO time specified  Skin: Serous blisters to R arm, Surg incision to groin.   Last BM:  Unknown  Height:  Ht Readings from Last 1 Encounters:  06/23/17 5\' 8"  (1.727 m)   Weight:  Wt Readings from Last 1 Encounters:  06/23/17 212 lb 1.3 oz (96.2 kg)   Wt Readings from Last 10 Encounters:  06/23/17 212 lb 1.3 oz (96.2 kg)  01/20/17 204 lb (92.5 kg)  12/30/16 204 lb 9.6 oz (92.8 kg)  12/24/16 193 lb (87.5 kg)  10/01/16 193 lb (87.5 kg)  09/16/16 201 lb (91.2 kg)  07/29/16 200 lb (90.7 kg)  07/02/16 193 lb (87.5 kg)  06/03/16 196 lb (88.9 kg)  05/18/16 195 lb 6.4 oz (88.6 kg)   Idea Body Weight:  63.64 kg  BMI:  Body mass index is 32.25 kg/m.  Estimated Nutritional Needs:  Kcal:  1060-1350 Protein:  >127 g (2 g/kg ibw) Fluid:  Per MD  EDUCATION  NEEDS:  No education needs identified at this time  Burtis Junes RD, LDN, [facility] Pager: [phone] 06/26/2017 2:42 PM

## 2017-06-26 NOTE — Progress Notes (Signed)
CRITICAL VALUE ALERT  Critical Value:  Troponin 1.19  Date & Time Notied:  06/26/2017 10:58 AM   Provider Notified: Dr. Titus Mould  Orders Received/Actions taken: Repeat troponin in 6 hours

## 2017-06-26 NOTE — Progress Notes (Signed)
eLink Physician-Brief Progress Note Patient Name: Melinda Noble DOB: Apr 07, 1930 MRN: [id number]   Date of Service  06/26/2017  HPI/Events of Note  I have spoken to family including husband about patient's present status.  They have decided to not resuscitate in event of cardiac arrest, but if intubation was needed short term they would want her to have that.  eICU Interventions  Code status updated in EMR     Intervention Category Major Interventions: End of life / care limitation discussion  Mauri Brooklyn, P 06/26/2017, 2:14 AM

## 2017-06-26 NOTE — Progress Notes (Signed)
STROKE TEAM PROGRESS NOTE   SUBJECTIVE (INTERVAL HISTORY) Dr. Titus Mould at bedside for intubation. Pt developed respiratory failure this am and is re-intubated. She was sedated and paralyzed during rounds.   OBJECTIVE Temp:  [98.3 F (36.8 C)-100.3 F (37.9 C)] 98.4 F (36.9 C) (09/08 0418) Pulse Rate:  [72-178] 94 (09/08 0700) Cardiac Rhythm: Normal sinus rhythm (09/08 0400) Resp:  [14-32] 32 (09/08 0700) BP: (96-193)/(58-173) 156/73 (09/08 0700) SpO2:  [85 %-100 %] 97 % (09/08 0700) FiO2 (%):  [100 %] 100 % (09/08 0011)  CBC:   Recent Labs Lab 06/20/17 0819  06/21/17 0349  06/24/17 0410 06/26/17 0228  WBC 6.7  --  13.8*  < > 7.1 13.0*  NEUTROABS 4.3  --  11.1*  --   --   --   HGB 13.3  < > 11.9*  < > 8.9* 9.3*  HCT 41.6  < > 37.4  < > 27.0* 28.3*  MCV 94.5  --  94.7  < > 90.6 93.4  PLT 157  --  174  < > 78* 163  < > = values in this interval not displayed.  Basic Metabolic Panel:   Recent Labs Lab 06/23/17 0224  06/24/17 0410 06/25/17 1256 06/26/17 0228  NA 139  < > 138 141 141  K 4.1  < > 3.8 3.5 3.6  CL 111  --  113* 111 110  CO2 23  --  20* 24 24  GLUCOSE 126*  --  98 98 135*  BUN 15  --  14 12 15   CREATININE 0.65  --  0.55 0.49 0.66  CALCIUM 8.8*  --  8.8* 9.2 9.6  MG 2.0  --  1.8  --   --   PHOS 2.5  --  2.1*  2.1*  --   --   < > = values in this interval not displayed.  Lipid Panel:     Component Value Date/Time   CHOL 133 06/21/2017 0349   CHOL 152 01/26/2017 0855   TRIG 121 06/21/2017 0349   HDL 45 06/21/2017 0349   HDL 56 01/26/2017 0855   CHOLHDL 3.0 06/21/2017 0349   VLDL 24 06/21/2017 0349   LDLCALC 64 06/21/2017 0349   LDLCALC 78 01/26/2017 0855   HgbA1c:  Lab Results  Component Value Date   HGBA1C 5.4 06/21/2017   Urine Drug Screen:     Component Value Date/Time   LABOPIA NONE DETECTED 06/20/2017 2201   COCAINSCRNUR NONE DETECTED 06/20/2017 2201   LABBENZ POSITIVE (A) 06/20/2017 2201   AMPHETMU NONE DETECTED 06/20/2017 2201    THCU NONE DETECTED 06/20/2017 2201   LABBARB NONE DETECTED 06/20/2017 2201    Alcohol Level No results found for: [place] I have personally reviewed the radiological images below and agree with the radiology interpretations.  Ct Head Code Stroke Wo Contrast 06/20/2017 IMPRESSION:  1. No significant interval change.  2. Stable atrophy and white matter disease.  3. No definite hyperdense vessel.  4. Stable right periorbital hematoma.  5. ASPECTS is 10/10   06/20/2017 IMPRESSION:  1. Stable atrophy and white matter disease.  2. No acute intracranial abnormality or significant interval change.  3. Prominent right infraorbital and lateral soft tissue hematoma without underlying fracture or intraorbital extension.  4. ASPECTS is 10/10  06/20/2017 IMPRESSION:  1. Focal density posteriorly in the left sylvian fissure may reflect contrast associated with a more distal occlusion.  2. No acute hemorrhage.  3. No evidence for progressing infarct.  4. Stable atrophy and white matter disease.  5. Increasing size of right infraorbital hematoma.   06/21/2017 1. New left posterolateral temporal lobe acute infarction.  2. Stable infarction centered in left frontal centrum semiovale extending into left superior lentiform nucleus and insula in comparison with prior CT/MRI head. Interval increase in edema and local mass effect associated with the infarct.  3. No acute intracranial hemorrhage.   Ct Maxillofacial Wo Contrast 06/20/2017 IMPRESSION:  1. No maxillofacial fracture.  2. Right infraorbital and periorbital soft tissue hematoma.   Mr Melinda Noble Head Wo Contrast 06/20/2017 IMPRESSION:  1. High-grade stenosis or occlusion of the distal left M1 segment with nonvisualization of distal MCA branch vessels. This corresponds with the patient's acute symptoms, likely representing an emergent large vessel occlusion.  2. Narrowing of the right M1 segment and attenuation of MCA branch vessels likely  related to atherosclerotic disease.  3. Posterior circulation is intact.  4. Ill-defined area of restricted diffusion within white matter of the left centrum semi ovale may reflect acute ischemia. No definite cortical infarct is present.  Mr Brain Wo Contrast 06/21/2017 IMPRESSION:  1. Increased size of acute/subacute left MCA territory infarct, now involving the left insular cortex, left frontal operculum, posterior left temporal and parietal lobe, and more anterior superior left frontal lobe.  2. No evidence for hemorrhagic conversion of left-sided infarcts.  3. Focal acute nonhemorrhagic infarct in the right parietal lobe and centrum semiovale. Bilateral disease raises concern for a central cardio embolic source.  4. Right periorbital hematoma.   TTE 06/20/2017  Left ventricle: The cavity size was normal. Wall thickness was   normal. Systolic function was mildly reduced. The estimated   ejection fraction was in the range of 45% to 50%. There is   akinesis of the apical myocardium. Doppler parameters are   consistent with abnormal left ventricular relaxation (grade 1   diastolic dysfunction).     PHYSICAL EXAM Vitals:   06/26/17 1530 06/26/17 1600 06/26/17 1630 06/26/17 1700  BP: (!) 92/34 (!) 95/49 (!) 74/63 (!) 83/43  Pulse: 77 66 77 64  Resp: 18 (!) 22 (!) 22 (!) 22  Temp:  (!) 100.7 F (38.2 C)    TempSrc:  Axillary    SpO2: 100% 100% 100% 100%  Weight:      Height:       Elderly Caucasian lady. Afebrile. Head is nontraumatic. Neck is supple without bruit.   Cardiac exam no murmur or gallop, irregularly irregular heart rate and rhythm.  Neurological Exam :  Pt was just sedated and paralyzed for re-intubation. No reliable neuro exam performed.    ASSESSMENT/PLAN Melinda Noble is a 83 y.o. female with history of history of PAF not on AC, HTN, CAD/MI s/p stent, CHF, HLD who presents with right sided weakness. She is status post IV tPA 0941 on 06/20/2017 and IR mechanical  thrombectomy.  Stroke: acute/subacute L MCA infarct, embolic, likely due to afib not on AC, s/p tPA and mechanical thrombectomy with TICI 2b reperfusion  Resultant intubated  CT head: no acute stroke.  Prominent right infraorbital and lateral soft tissue hematoma without underlying fracture or intraorbital extension  MRI head: acute/subacute L MCA infarct, involving the left insular cortex, left frontal operculum, posterior left temporal and parietal lobe, and more anterior superior left frontal lobe.  Focal acute nonhemorrhagic infarct in the right parietal lobe and centrum semiovale.  MRA head: High-grade L M1 stenosis.  Ill-defined area of restricted diffusion within white matter of  the left centrum semi ovale may reflect acute ischemia  IR / DSA - left M1 mechanical thrombectomy with TICI2b reperfusion  TTE EF 45% to 50%  LDL 64  HgbA1c 5.4  SCDs for VTE prophylaxis Diet NPO time specified  aspirin 81 mg daily prior to admission, now on aspirin 81mg  and plavix 75mg  daily. Consider AC in 7-10 days and medical condition stabilized with no procedure planned  Patient counseled to be compliant with her antithrombotic medications  Ongoing aggressive stroke risk factor management  Therapy recommendations:   pending  Disposition:  Pending  PAF not on AC  Previous on amiodarone  Follows with Dr. Tamala Julian in cardiology  Currently rate controlled  On ASA now  Consider AC in 7-10 days and medical condition stabilized with no procedure planned.  Respiratory failure  Intubated - extubated - re-intubated  CXR - pulmonary edema and cardiomegaly  Continue diuresis  CCM on board  Right femoral pseudoaneurysm  S/p emergency repair 06/23/17  VVS on board, appreciate help  Right groin JP drain  Hypotension  BP low  On Neo  CVP monitoring  Long-term BP goal normotensive  Anemia, acute blood loss  Hb 9.4->6.1->7.8->8.9->9.3  CBC monitoring  Continue  ASA  Hyperlipidemia  Home meds: Rosuvastatin 5 mg daily  LDL 64, goal < 70  On crestor 10mg  now  Continue statin at discharge  Other Stroke Risk Factors  Advanced age  Obesity, Body mass index is 32.25 kg/m., recommend weight loss, diet and exercise as appropriate   CAD/MI s/p stent - on ASA now  Other Active Problems  Leukocytosis 7.1 - 13.0  Low-grade fever - Tmax 100.7  Palliative Care consult today - Family meeting scheduled for 06/27/2017 at 2 PM to further address goals of care specifically one way extubation  Hospital day # 6  This patient is critically ill and at significant risk of neurological worsening, death and care requires constant monitoring of vital signs, hemodynamics,respiratory and cardiac monitoring, extensive review of multiple databases, frequent neurological assessment, discussion with family, other specialists and medical decision making of high complexity.I have made any additions or clarifications directly to the above note.This critical care time does not reflect procedure time, or teaching time or supervisory time of PA/NP/Med Resident etc but could involve care discussion time. I spent 35 minutes of neurocritical care time  in the care of  this patient.  Rosalin Hawking, MD PhD Stroke Neurology 06/26/2017 10:05 PM   To contact Stroke Continuity provider, please refer to [email or web address] After hours, contact General Neurology

## 2017-06-26 NOTE — Progress Notes (Signed)
Pharmacy Antibiotic Note  Melinda Noble is a 83 y.o. female admitted on 06/20/2017 with stroke.  Pharmacy has been consulted for levaquin dosing for possible pneumonia. TMax is 100.3 and WBC is elevated at 13. SCr is WNL.   Plan: Levaquin 750mg  IV Q24H F/u renal fxn, C&S, clinical status  Height: 5\' 8"  (172.7 cm) Weight: 212 lb 1.3 oz (96.2 kg) IBW/kg (Calculated) : 63.9  Temp (24hrs), Avg:98.6 F (37 C), Min:97.2 F (36.2 C), Max:100.3 F (37.9 C)   Recent Labs Lab 06/21/17 0349 06/23/17 0224 06/23/17 2335 06/24/17 0410 06/25/17 1256 06/26/17 0228  WBC 13.8* 7.8 8.8 7.1  --  13.0*  CREATININE 0.75 0.65  --  0.55 0.49 0.66    Estimated Creatinine Clearance: 63.5 mL/min (by C-G formula based on SCr of 0.66 mg/dL).    Allergies  Allergen Reactions  . Ace Inhibitors Anaphylaxis  . Beta Adrenergic Blockers Anaphylaxis  . Iohexol Anaphylaxis     Code: HIVES, Desc: anaphylactic shock s/p contrast injection many yrs ago--suggested that pt NEVER have iv contrast//a.c., Onset Date: [id number]   . Atorvastatin Rash  . Latex Rash  . Penicillins Swelling and Rash    Details are unknown  . Sulfa Antibiotics Rash    Antimicrobials this admission: Levaquin 9/8>>  Dose adjustments this admission: N/A  Microbiology results: 9/2 MRSA - NEG  Thank you for allowing pharmacy to be a part of this patient's care.  Honor Frison, Rande Lawman 06/26/2017 8:57 AM

## 2017-06-26 NOTE — Progress Notes (Signed)
PULMONARY / CRITICAL CARE MEDICINE   Name: Melinda Noble MRN: [id number] DOB: 12-Apr-1930    ADMISSION DATE:  06/20/2017 CONSULTATION DATE:  9/2  REFERRING HE:[id number]   CHIEF COMPLAINT:   Vent management post CVA critical care   HISTORY OF PRESENT ILLNESS:   This is a 83 year old female w/ sig h/o atrial fib (not on systemic A/C). Presented to the ER on 9/2 w/ new (R) sided hemiplegia (she was independent w/ ADLs prior to this).   ER course: MRA ->felt trickle flow to left MCA but no areas of infarct.  -received TPA in the ER-->clinically actually declined.  -repeat CT head-->negative for ICH -went to IR: for cerebral arteriogram and endo vascularization.  -returned to the ICU on vent. PCCM asked to a/w care   SUBJECTIVE:  worsenng distress, NIMV  VITAL SIGNS: BP (!) 182/83   Pulse 91   Temp (!) 97.2 F (36.2 C) (Axillary)   Resp (!) 22   Ht 5\' 8"  (1.727 m)   Wt 96.2 kg (212 lb 1.3 oz)   SpO2 97%   BMI 32.25 kg/m    INTAKE / OUTPUT: I/O last 3 completed shifts: In: 780 [I.V.:580; IV Piggyback:200] Out: 2485 [Urine:2325; Drains:160]  PHYSICAL EXAMINATION: General: distress, confused Neuro: confused, moving left, int fc HEENT: jvd PULM: crackles, coarse CV: s1 s2 RRR not tachy GI: soft, BS wnl, wound clean Extremities:  Wound rt fem clean  LABS:  BMET  Recent Labs Lab 06/24/17 0410 06/25/17 1256 06/26/17 0228  NA 138 141 141  K 3.8 3.5 3.6  CL 113* 111 110  CO2 20* 24 24  BUN 14 12 15   CREATININE 0.55 0.49 0.66  GLUCOSE 98 98 135*   Electrolytes  Recent Labs Lab 06/21/17 0349 06/23/17 0224 06/24/17 0410 06/25/17 1256 06/26/17 0228  CALCIUM 8.9 8.8* 8.8* 9.2 9.6  MG 1.7 2.0 1.8  --   --   PHOS 2.8 2.5 2.1*  2.1*  --   --    CBC  Recent Labs Lab 06/23/17 2335 06/24/17 0410 06/26/17 0228  WBC 8.8 7.1 13.0*  HGB 9.1* 8.9* 9.3*  HCT 28.2* 27.0* 28.3*  PLT 93* 78* 163   Coag's  Recent Labs Lab 06/20/17 0819 06/23/17 2335   APTT 32 33  INR 0.94 1.27   Sepsis Markers No results for input(s): LATICACIDVEN, PROCALCITON, O2SATVEN in the last 168 hours.  ABG  Recent Labs Lab 06/23/17 2118 06/24/17 0015 06/26/17 0328  PHART 7.353 7.313* 7.249*  PCO2ART 36.8 41.0 57.6*  PO2ART 337.0* 120.0* 146*   Liver Enzymes  Recent Labs Lab 06/20/17 0819 06/21/17 0349 06/24/17 0410  AST 26  --   --   ALT 23  --   --   ALKPHOS 72  --   --   BILITOT 0.6  --   --   ALBUMIN 3.9 3.0* 2.7*   Cardiac Enzymes No results for input(s): TROPONINI, PROBNP in the last 168 hours.  Glucose  Recent Labs Lab 06/25/17 0733 06/25/17 1124 06/25/17 1525 06/25/17 2105 06/26/17 0415 06/26/17 0737  GLUCAP 102* 111* 90 107* 117* 132*   Imaging  STUDIES:  CT head 9/2: 1. Focal density posteriorly in the left sylvian fissure may reflect contrast associated with a more distal occlusion. 2. No acute hemorrhage. 3. No evidence for progressing infarct. 4. Stable atrophy and white matter disease. 5. Increasing size of right infraorbital hematoma. MCA 9/2:  High grade stenosis or occlusion of the distal M1 seg w/  nonvisualization of the distal MCA branch   CXR 9/2: ET tube in good position, mild venous congestion. No infiltrate. CULTURES:   ANTIBIOTICS:   SIGNIFICANT EVENTS: 9/5>> OR for repair of R femoral pseudoaneurysm 9/7 extubated 9/8- pulm edema? Asp? NIMV  LINES/TUBES: ETT 9/2>>>9/4, 9/5>>>9/6  DISCUSSION: Acute left MCA stroke w/ progression in spite of TPA. Now s/p IR for endovascularization. CCM on board for vent management. Extubated 9/6  ASSESSMENT / PLAN:  PULMONARY A: Need for mechanical ventilation for altered mental status.  Reintubated for OR 9/5  abg reviewed PCXR still pending Successfully extubated 9/6, protecting airway 9/8 recurrent resp failure, favor pulm edema, r/o asp P:   Lasix to neg balance Consider re intubation, she is a poor candidate NIMV and is failing this  therapy Intubate after d./w family, 8 cc/kg, 100%, peep 5, abg, pcxr pcxr to follow  CARDIOVASCULAR A:  Mild HTN  H/o afib Converted to rate controlled a-fib 9/7 early am P:  SBP goal per stroke team lasix Cont tele monitoring Holding outpatient aspirin, Crestor Neg balance goals 1 liter daily Trop assessment, ecg  RENAL A:   Pulm edema P:   Trend chem  Strict I&O Replete electrolytes as needed   GASTROINTESTINAL A:   Inadequate oral intake due to mental status - resolved.  P:   NPO Restart feeds after ett  HEMATOLOGIC A:   No acute w/out evidence of bleeding Thrombocytopenia>> ? Consumptive from hematoma formation - improved Bloody drainage from JP ( 70 cc last pm)  P:  Trend cbc in am  scd as able  INFECTIOUS A:   At risk aspiration P:   Sputum Add nosocomial hcap coverage for now with clinical declines  ENDOCRINE A:   Hypothyroidism Hypoglycemic overnight 9/7 P:  CBG's Q 4  IV Synthroid feed  NEUROLOGIC A:   Left MCA stroke w/ right sided hemiplegia. Now s/p TPA and revascularization in IR P:   Supportive care  Per neuro  Prop drip likley needed, fent int after ett   FAMILY  - Updates:  daughter updated at bedside 9/7.  Updated daughter on dedclines 9/8  Ccm time 40 min   Lavon Paganini. Titus Mould, MD, [place] Pgr: [facility] 06/26/2017 8:34 AM

## 2017-06-26 NOTE — Progress Notes (Signed)
New central venous catheter was placed anticipating the need for pressors. After obtaining informed consent and timeout was performed and then the area over the left subclavian vein was sterilely prepped with chlorhexidine. Vein was easily cannulated and a wire was gently passed. The tract was dilated and a 7 French 20 cm catheter was placed. He was good flow from all ports. Catheter was sutured in place and a sterile dressing applied. Chest x-ray shows good placement without pneumothorax.

## 2017-06-26 NOTE — Progress Notes (Signed)
eLink Physician-Brief Progress Note Patient Name: Melinda Noble DOB: 1930/03/20 MRN: [id number]   Date of Service  06/26/2017  HPI/Events of Note  Hypotension - BP = 84/50 with MAP = 59.  eICU Interventions  Will order: 1. Phenylephrine IV infusion. Titrate to MAP > 65. 2. Monitor CVP.      Intervention Category Major Interventions: Hypotension - evaluation and management  Sommer,Steven Cornelia Copa 06/26/2017, 7:47 PM

## 2017-06-26 NOTE — Consult Note (Signed)
Consultation Note Date: 06/26/2017   Patient Name: Melinda Noble  DOB: [birth year]  MRN: [id number]  Age / Sex: 83 y.o., female  PCP: Gayland Curry, DO Referring Physician: Rosalin Hawking, MD  Reason for Consultation: Establishing goals of care, Psychosocial/spiritual support and Withdrawal of life-sustaining treatment  HPI/Patient Profile: 83 y.o. female  with past medical history of Atrial fibrillation (not on anticoagulation), hypertension, coronary artery disease with history of STEMI 2014, hypothyroidism admitted on 06/20/2017 with new onset of right-sided weakness. Patient's husband reports at the time of admission, he was speaking with her while they were both getting dressed to go to church, when he stepped out of the room he heard her fall ,. Per MRA, new left MCA syndrome. CT of the head was negative for hemorrhage and she was taken to IR for neuro intervention , TPA, Fr sheath. After sheath was removed she developed a delayed hematoma and then pseudoaneurysm to right groin. Right femoral aneurysm was not amenable to injection and patient was taken to the OR for repair. Patient was intubated and transferred to ICU after repair. Patient has had agitation while in the hospital requiring Precedex as well as pressors. Patient extubated on 06/25/2017. On the evening of 06/25/2017 patient developed dropping O2 sats, shortness of breath and she was placed on a nonrebreather. Chest x-ray shows bilateral effusions and edema. Family notified of worsening clinical condition and plan was made to begin Lasix 40 mg IV twice daily, first dose now; if does not improve family would like patient re-intubated.   Clinical Assessment and Goals of Care: Upon my arrival to patient's room in a.m. 06/26/2017 patient was in respiratory distress. Dr. Titus Mould with critical care medicine was at the bedside talking to patient's daughter,  Vinnie Level, with the following plan: To reintubate short-term, pulmonary edema this may be reversible with Lasix, if this is aspiration we'll need to address different disease trajectory.   Vinnie Level shares that her mother would not want to "live like a vegetable", but her father (patient's husband), is wrestling with wanting to do everything for her.  Patient at this time point is unable to speak for herself. Her healthcare proxy would be her husband, [name]; they have 4 children. They make decisions as a family. Per daughter Vinnie Level who is at the bedside, it sounds as if patient's wishes would be full DO NOT RESUSCITATE and not to re intubate but will further address in family mtg scheduled for 9/9 2pm    SUMMARY OF RECOMMENDATIONS   Continue with partial code: no CPR no defibrillation; okay with BiPAP and reintubation Family meeting scheduled for 06/27/2017 at 2 PM to further address goals of care specifically one way extubation Code Status/Advance Care Planning:  Limited code    Symptom Management:   Respiratory distress: Patient now intubated and on propofol  Palliative Prophylaxis:   Aspiration, Bowel Regimen, Delirium Protocol, Eye Care, Frequent Pain Assessment, Oral Care and Turn Reposition  Additional Recommendations (Limitations, Scope, Preferences):  Full Scope Treatment except for no CPR, no  defibrillation. Patient is currently intubated  Psycho-social/Spiritual:   Desire for further Chaplaincy support:no  Additional Recommendations: Grief/Bereavement Support  Prognosis:   Unable to determine  Discharge Planning: To Be Determined      Primary Diagnoses: Present on Admission: . Acute ischemic stroke ([place])   I have reviewed the medical record, interviewed the patient and family, and examined the patient. The following aspects are pertinent.  Past Medical History:  Diagnosis Date  . Anemia   . CAD (coronary artery disease)    a. STEMI 10/09/13 - 100% LAD -  following initial angioplasty, ? Spontaneous dissection vs related to ballon. s/p complex PCI with 2 overlapping DES to LAD. Adm complicated by VDRF, cardiogenic shock, peri-MI PAF with NSR on amiodarone. Residual mod RCA disease.   Marland Kitchen CAD S/P percutaneous coronary angioplasty 10/09/13   DES x 2 to Mid LAD, 2.5 x 38, 2.5 x 12 Promus (covering extensive disection), moderate RCA disesase  . Cardiogenic shock ([place])    a. 09/2013 - during admission for STEMI.  . Depression   . Dyslipidemia   . H/O Acute respiratory failure due to hypoxia 10/10/2013   a. 09/2013 - secondary to anterior STEMI related pulmonary edema (resolved).  . Hypertension   . Hypothyroidism   . Ischemic cardiomyopathy    a. EF 30-35% during 09/2013 adm for STEMI. b. Repeat Echo EF 40-45% with distal LAD hypokinesis (10/16/14 following STEMI recovery).  . Migraine headache    a. Remotely.  . Orthostatic hypotension    a. Adm 10/2013 for this, felt due to Lasix. Not on regular diuretic due to this/dehydration.  Marland Kitchen PAF (paroxysmal atrial fibrillation) ([place])    a. 09/2013 - peri-MI, NSR at discharge on amiodarone.  . Sinus bradycardia    Social History   Social History  . Marital status: Married    Spouse name: N/A  . Number of children: N/A  . Years of education: N/A   Social History Main Topics  . Smoking status: Former Research scientist (life sciences)  . Smokeless tobacco: Never Used  . Alcohol use No  . Drug use: No  . Sexual activity: Not Asked   Other Topics Concern  . None   Social History Narrative   Patient is Married, since 1958 [name])   Lives in apartment, Independent Living section at [facility] since 04/2013.   Stopped Smoking many years ago, minimal aalcohol history.   Regular exercise includes swimming 3d/week,   Patient has no Advanced planning documents   Family History  Problem Relation Age of Onset  . Varicose Veins Father   . Heart attack Father   . Heart failure Mother   . Anemia Mother     Scheduled Meds: . aspirin  81 mg Oral Daily  . buPROPion  150 mg Oral Daily  . calcium-vitamin D  1 tablet Oral Daily  . carvedilol  6.25 mg Oral BID WC  . chlorhexidine gluconate (MEDLINE KIT)  15 mL Mouth Rinse BID  . cholecalciferol  1,000 Units Oral Daily  . clopidogrel  75 mg Oral Daily  . feeding supplement (PRO-STAT SUGAR FREE 64)  30 mL Per Tube BID  . feeding supplement (VITAL HIGH PROTEIN)  1,000 mL Per Tube Q24H  . levothyroxine  50 mcg Intravenous Daily  . losartan  100 mg Oral Daily  . mouth rinse  15 mL Mouth Rinse 10 times per day  . montelukast  10 mg Oral QHS  . multivitamin with minerals  1 tablet Oral Daily  .  pantoprazole (PROTONIX) IV  40 mg Intravenous QHS  . [START ON 06/28/2017] rosuvastatin  10 mg Oral Once per day on Mon Wed Fri  . sertraline  100 mg Oral Daily  . vitamin E  400 Units Oral Daily   Continuous Infusions: . dextrose 5 % and 0.9% NaCl 20 mL/hr at 06/26/17 1044  . famotidine (PEPCID) IV Stopped (06/26/17 1115)  . fentaNYL infusion INTRAVENOUS 25 mcg/hr (06/26/17 1050)  . levofloxacin (LEVAQUIN) IV Stopped (06/26/17 1304)  . propofol (DIPRIVAN) infusion Stopped (06/26/17 1025)   PRN Meds:.acetaminophen **OR** acetaminophen (TYLENOL) oral liquid 160 mg/5 mL **OR** acetaminophen, clobetasol ointment, fentaNYL, ondansetron (ZOFRAN) IV Medications Prior to Admission:  Prior to Admission medications   Medication Sig Start Date End Date Taking? Authorizing Provider  acetaminophen (TYLENOL) 325 MG tablet Take 650 mg by mouth every 6 (six) hours as needed for mild pain.   Yes [provider]  ALPRAZolam (XANAX) 0.25 MG tablet TAKE (1) TABLET TWICE DAILY AS NEEDED FOR ANXIETY. Patient taking differently: TAKE HALF TABLET (0.137m) TWICE DAILY AS NEEDED FOR ANXIETY. 04/19/17  Yes Reed, Tiffany L, DO  aspirin 81 MG chewable tablet Chew 1 tablet (81 mg total) by mouth daily. 10/17/13  Yes SLyda JesterM, PA-C  Biotin 1000 MCG tablet Take  1,000 mcg by mouth daily.   Yes [provider]  buPROPion (WELLBUTRIN SR) 150 MG 12 hr tablet Take 150 mg by mouth every morning.  08/22/16  Yes [provider]  calcium-vitamin D (OSCAL WITH D) 500-200 MG-UNIT per tablet Take 1 tablet by mouth daily.    Yes [provider]  carvedilol (COREG) 6.25 MG tablet Take 1 tablet (6.25 mg total) by mouth 2 (two) times daily with a meal. 04/09/14  Yes HLeonie Man MD  cholecalciferol (VITAMIN D) 1000 UNITS tablet Take 1,000 Units by mouth daily.   Yes [provider]  clobetasol ointment (TEMOVATE) 06.78% Apply 1 application topically 2 (two) times daily as needed. (affected areas)--also thighs 07/29/16  Yes Reed, Tiffany L, DO  clopidogrel (PLAVIX) 75 MG tablet Take 75 mg by mouth daily.   Yes [provider]  fexofenadine (ALLEGRA) 60 MG tablet Take 60 mg by mouth 2 (two) times daily as needed for allergies or rhinitis.   Yes [provider]  furosemide (LASIX) 20 MG tablet TAKE 1/2 TABLET DAILY. Patient taking differently: TAKE 1/2 TABLET (145m DAILY. 05/03/17  Yes Reed, Tiffany L, DO  losartan (COZAAR) 100 MG tablet Take 1 tablet (100 mg total) by mouth daily. 12/30/16 06/21/17 Yes SmBelva CromeMD  montelukast (SINGULAIR) 10 MG tablet Take 10 mg by mouth at bedtime.   Yes [provider]  Multiple Vitamin (MULTIVITAMIN WITH MINERALS) TABS tablet Take 1 tablet by mouth daily.   Yes [provider]  nitroGLYCERIN (NITROSTAT) 0.4 MG SL tablet Place 0.4 mg under the tongue every 5 (five) minutes as needed for chest pain.   Yes [provider]  omeprazole (PRILOSEC) 20 MG capsule Take 20 mg by mouth daily.    Yes [provider]  Polyethyl Glycol-Propyl Glycol (SYSTANE OP) Apply 1 drop to eye daily as needed (dry eyes).   Yes [provider]  rosuvastatin (CRESTOR) 5 MG tablet Take 10 mg by mouth 3 (three) times a week. (Mondays, Wednesdays, and Fridays)    Yes [provider]  sertraline (ZOLOFT) 100 MG tablet Take 100 mg by mouth daily.  07/06/16  Yes [provider]  SYNTHROID 100  MCG tablet TAKE 1 TABLET IN THE MORNING ON AN EMPTY STOMACH. Patient taking differently: TAKE 1 TABLET (146mg) IN THE MORNING ON AN EMPTY STOMACH. 05/17/17  Yes Reed, Tiffany L, DO  vitamin E (VITAMIN E) 400 UNIT capsule Take 400 Units by mouth daily.   Yes [provider]   Allergies  Allergen Reactions  . Ace Inhibitors Anaphylaxis  . Beta Adrenergic Blockers Anaphylaxis  . Iohexol Anaphylaxis     Code: HIVES, Desc: anaphylactic shock s/p contrast injection many yrs ago--suggested that pt NEVER have iv contrast//a.c., Onset Date: [id number]  . Atorvastatin Rash  . Latex Rash  . Penicillins Swelling and Rash    Details are unknown  . Sulfa Antibiotics Rash   Review of Systems  Unable to perform ROS: Severe respiratory distress    Physical Exam  Constitutional: She appears well-developed and well-nourished.  Acutely ill appearing female in acute respiratory distress. Critical care physician at the bedside preparing to intubate  HENT:  Head: Normocephalic.  Facial bruising  Cardiovascular:  Irregular, V. tach  Pulmonary/Chest: She is in respiratory distress.  Genitourinary:  Genitourinary Comments: Foley catheter  Neurological:  Agitated, unable to follow commands  Skin:  Pale, cool, extensive ecchymoses to face  Psychiatric:  Unable to test  Nursing note and vitals reviewed.   Vital Signs: BP (!) 83/40   Pulse 70   Temp 98.9 F (37.2 C) (Axillary)   Resp (!) 22   Ht '5\' 8"'  (1.727 m)   Wt 96.2 kg (212 lb 1.3 oz)   SpO2 100%   BMI 32.25 kg/m  Pain Assessment: CPOT       SpO2: SpO2: 100 % O2 Device:SpO2: 100 % O2 Flow Rate: .O2 Flow Rate (L/min): 3 L/min  IO: Intake/output summary:  Intake/Output Summary (Last 24 hours) at 06/26/17 1439 Last data filed at 06/26/17 1400  Gross per 24 hour  Intake               570 ml  Output             2595 ml  Net            -2025 ml    LBM: Last BM Date:  (PTA) Baseline Weight: Weight: 96.2 kg (212 lb 1.3 oz) (Per ED weighing bed prior to CT) Most recent weight: Weight: 96.2 kg (212 lb 1.3 oz)     Palliative Assessment/Data:   Flowsheet Rows     Most Recent Value  Intake Tab  Referral Department  Critical care  Unit at Time of Referral  ICU  Palliative Care Primary Diagnosis  Neurology  Date Notified  06/26/17  Palliative Care Type  New Palliative care  Reason for referral  Clarify Goals of Care, Psychosocial or Spiritual support  Date of Admission  06/20/17  Date first seen by Palliative Care  06/26/17  # of days Palliative referral response time  0 Day(s)  # of days IP prior to Palliative referral  6  Clinical Assessment  Palliative Performance Scale Score  30%  Pain Max last 24 hours  Not able to report  Pain Min Last 24 hours  Not able to report  Dyspnea Max Last 24 Hours  Not able to report  Dyspnea Min Last 24 hours  Not able to report  Nausea Max Last 24 Hours  Not able to report  Nausea Min Last 24 Hours  Not able to report  Anxiety Max Last 24 Hours  Not able to report  Anxiety Min Last 24 Hours  Not able to report  Other Max Last 24 Hours  Not able to report  Psychosocial & Spiritual Assessment  Palliative Care Outcomes  Patient/Family meeting held?  Yes  Who was at the meeting?  Daughter Vinnie Level. Family mtg planned for 9/9 '@2pm'   Palliative Care Outcomes  Provided psychosocial or spiritual support  Palliative Care follow-up planned  Yes, Facility      Time In: 0900 Time Out: 1020 Time Total: 80 min Greater than 50%  of this time was spent counseling and coordinating care related to the above assessment and plan.Staffed with Dr. Harland Dingwall Signed by: Dory Horn, NP   Please contact Palliative Medicine Team phone at [phone] for questions and concerns.  For individual provider: See Shea Evans

## 2017-06-27 ENCOUNTER — Inpatient Hospital Stay (HOSPITAL_COMMUNITY): Payer: Medicare Other

## 2017-06-27 DIAGNOSIS — Z978 Presence of other specified devices: Secondary | ICD-10-CM

## 2017-06-27 DIAGNOSIS — R609 Edema, unspecified: Secondary | ICD-10-CM

## 2017-06-27 DIAGNOSIS — I63132 Cerebral infarction due to embolism of left carotid artery: Secondary | ICD-10-CM

## 2017-06-27 DIAGNOSIS — I63011 Cerebral infarction due to thrombosis of right vertebral artery: Secondary | ICD-10-CM

## 2017-06-27 DIAGNOSIS — I724 Aneurysm of artery of lower extremity: Secondary | ICD-10-CM

## 2017-06-27 LAB — BPAM RBC
BLOOD PRODUCT EXPIRATION DATE: 201809272359
BLOOD PRODUCT EXPIRATION DATE: 201810032359
Blood Product Expiration Date: 201810032359
Blood Product Expiration Date: 201810032359
ISSUE DATE / TIME: 201809052026
ISSUE DATE / TIME: 201809052026
UNIT TYPE AND RH: 5100
UNIT TYPE AND RH: 5100
Unit Type and Rh: 5100
Unit Type and Rh: 5100

## 2017-06-27 LAB — TYPE AND SCREEN
ABO/RH(D): O POS
Antibody Screen: NEGATIVE
UNIT DIVISION: 0
UNIT DIVISION: 0
Unit division: 0
Unit division: 0

## 2017-06-27 LAB — CBC WITH DIFFERENTIAL/PLATELET
BASOS ABS: 0 10*3/uL (ref 0.0–0.1)
Basophils Relative: 0 %
EOS ABS: 0.3 10*3/uL (ref 0.0–0.7)
EOS PCT: 3 %
HCT: 24.9 % — ABNORMAL LOW (ref 36.0–46.0)
Hemoglobin: 8.1 g/dL — ABNORMAL LOW (ref 12.0–15.0)
Lymphocytes Relative: 17 %
Lymphs Abs: 1.7 10*3/uL (ref 0.7–4.0)
MCH: 30.1 pg (ref 26.0–34.0)
MCHC: 32.5 g/dL (ref 30.0–36.0)
MCV: 92.6 fL (ref 78.0–100.0)
Monocytes Absolute: 1 10*3/uL (ref 0.1–1.0)
Monocytes Relative: 10 %
Neutro Abs: 6.9 10*3/uL (ref 1.7–7.7)
Neutrophils Relative %: 70 %
PLATELETS: 172 10*3/uL (ref 150–400)
RBC: 2.69 MIL/uL — AB (ref 3.87–5.11)
RDW: 15.7 % — ABNORMAL HIGH (ref 11.5–15.5)
WBC: 10 10*3/uL (ref 4.0–10.5)

## 2017-06-27 LAB — PHOSPHORUS
Phosphorus: 1.1 mg/dL — ABNORMAL LOW (ref 2.5–4.6)
Phosphorus: 1.9 mg/dL — ABNORMAL LOW (ref 2.5–4.6)

## 2017-06-27 LAB — BASIC METABOLIC PANEL
ANION GAP: 6 (ref 5–15)
ANION GAP: 8 (ref 5–15)
BUN: 34 mg/dL — ABNORMAL HIGH (ref 6–20)
BUN: 28 mg/dL — ABNORMAL HIGH (ref 6–20)
CALCIUM: 9.2 mg/dL (ref 8.9–10.3)
CALCIUM: 8.7 mg/dL — AB (ref 8.9–10.3)
CO2: 27 mmol/L (ref 22–32)
CO2: 28 mmol/L (ref 22–32)
CREATININE: 0.55 mg/dL (ref 0.44–1.00)
Chloride: 109 mmol/L (ref 101–111)
Chloride: 104 mmol/L (ref 101–111)
Creatinine, Ser: 0.61 mg/dL (ref 0.44–1.00)
GFR calc Af Amer: >60 mL/min (ref >60)
GLUCOSE: 125 mg/dL — AB (ref 65–99)
Glucose, Bld: 104 mg/dL — ABNORMAL HIGH (ref 65–99)
POTASSIUM: 3.1 mmol/L — AB (ref 3.5–5.1)
Potassium: 2.8 mmol/L — ABNORMAL LOW (ref 3.5–5.1)
SODIUM: 140 mmol/L (ref 135–145)
Sodium: 142 mmol/L (ref 135–145)

## 2017-06-27 LAB — GLUCOSE, CAPILLARY
GLUCOSE-CAPILLARY: 116 mg/dL — AB (ref 65–99)
GLUCOSE-CAPILLARY: 116 mg/dL — AB (ref 65–99)
GLUCOSE-CAPILLARY: 143 mg/dL — AB (ref 65–99)
Glucose-Capillary: 147 mg/dL — ABNORMAL HIGH (ref 65–99)

## 2017-06-27 LAB — MAGNESIUM
MAGNESIUM: 1.7 mg/dL (ref 1.7–2.4)
MAGNESIUM: 2.1 mg/dL (ref 1.7–2.4)

## 2017-06-27 MED ORDER — POTASSIUM CHLORIDE 10 MEQ/50ML IV SOLN
10.0000 meq | INTRAVENOUS | Status: AC
  Administered 2017-06-27 – 2017-06-28 (×4): 10 meq via INTRAVENOUS
  Filled 2017-06-27 (×4): qty 50

## 2017-06-27 MED ORDER — POTASSIUM CHLORIDE 10 MEQ/50ML IV SOLN
INTRAVENOUS | Status: AC
  Administered 2017-06-27: 10 meq
  Filled 2017-06-27: qty 50

## 2017-06-27 MED ORDER — FUROSEMIDE 10 MG/ML IJ SOLN
80.0000 mg | Freq: Three times a day (TID) | INTRAMUSCULAR | Status: AC
  Administered 2017-06-27 (×2): 80 mg via INTRAVENOUS
  Filled 2017-06-27 (×2): qty 8

## 2017-06-27 MED ORDER — MAGNESIUM SULFATE 4 GM/100ML IV SOLN
4.0000 g | Freq: Once | INTRAVENOUS | Status: AC
  Administered 2017-06-27: 4 g via INTRAVENOUS
  Filled 2017-06-27: qty 100

## 2017-06-27 MED ORDER — DEXTROSE 5 % IV SOLN
30.0000 mmol | Freq: Once | INTRAVENOUS | Status: AC
  Administered 2017-06-27: 30 mmol via INTRAVENOUS
  Filled 2017-06-27: qty 10

## 2017-06-27 NOTE — Progress Notes (Signed)
VASCULAR LAB PRELIMINARY  PRELIMINARY  PRELIMINARY  PRELIMINARY  Bilateral upper extremity venous duplex completed.    Preliminary report:  There is no DVT or SVT noted in the bilateral upper extremities.   Dayjah Selman, RVT 06/27/2017, 11:13 AM

## 2017-06-27 NOTE — Progress Notes (Signed)
Daily Progress Note   Patient Name: Melinda Noble       Date: 06/27/2017 DOB: May 11, 1930  Age: 83 y.o. MRN#: [id number] Attending Physician: Rosalin Hawking, MD Primary Care Physician: Gayland Curry, DO Admit Date: 06/20/2017  Reason for Consultation/Follow-up: Establishing goals of care and Psychosocial/spiritual support  Subjective: Patient improving with diuresis in terms of respiratory status. They have begun weaning her.  Length of Stay: 7  Current Medications: Scheduled Meds:  . aspirin  81 mg Oral Daily  . buPROPion  150 mg Oral Daily  . calcium-vitamin D  1 tablet Oral Daily  . carvedilol  6.25 mg Oral BID WC  . chlorhexidine gluconate (MEDLINE KIT)  15 mL Mouth Rinse BID  . cholecalciferol  1,000 Units Oral Daily  . clopidogrel  75 mg Oral Daily  . feeding supplement (PRO-STAT SUGAR FREE 64)  30 mL Per Tube TID  . feeding supplement (VITAL HIGH PROTEIN)  1,000 mL Per Tube Q24H  . furosemide  80 mg Intravenous Q8H  . levothyroxine  50 mcg Intravenous Daily  . losartan  100 mg Oral Daily  . mouth rinse  15 mL Mouth Rinse 10 times per day  . montelukast  10 mg Oral QHS  . multivitamin with minerals  1 tablet Oral Daily  . pantoprazole (PROTONIX) IV  40 mg Intravenous QHS  . [START ON 06/28/2017] rosuvastatin  10 mg Oral Once per day on Mon Wed Fri  . sertraline  100 mg Oral Daily  . vitamin E  400 Units Oral Daily    Continuous Infusions: . dextrose 5 % and 0.9% NaCl 20 mL/hr at 06/26/17 2000  . famotidine (PEPCID) IV 20 mg (06/27/17 1027)  . fentaNYL infusion INTRAVENOUS 100 mcg/hr (06/26/17 2320)  . levofloxacin (LEVAQUIN) IV 750 mg (06/27/17 1027)  . phenylephrine (NEO-SYNEPHRINE) Adult infusion 55 mcg/min (06/27/17 1015)  . potassium PHOSPHATE IVPB (mmol) 30 mmol  (06/27/17 0835)  . propofol (DIPRIVAN) infusion Stopped (06/26/17 1025)    PRN Meds: acetaminophen **OR** acetaminophen (TYLENOL) oral liquid 160 mg/5 mL **OR** acetaminophen, clobetasol ointment, fentaNYL, ondansetron (ZOFRAN) IV  Physical Exam  Constitutional:  Acutely ill appearing elderly female; on ventilator  HENT:  Facial bruising  Cardiovascular:  Irregular  Pulmonary/Chest:  On ventilator  Genitourinary:  Genitourinary Comments: Foley  Skin: Skin is warm  and dry.  Extensive facial bruising  Psychiatric:  Unable to test  Nursing note and vitals reviewed.           Vital Signs: BP 128/65   Pulse 79   Temp 98.8 F (37.1 C) (Axillary)   Resp 10   Ht _0  (1.727 m)   Wt 98.5 kg (217 lb 2.5 oz)   SpO2 97%   BMI 33.02 kg/m  SpO2: SpO2: 97 % O2 Device: O2 Device: Ventilator O2 Flow Rate: O2 Flow Rate (L/min): 3 L/min  Intake/output summary:  Intake/Output Summary (Last 24 hours) at 06/27/17 1112 Last data filed at 06/27/17 1100  Gross per 24 hour  Intake          2223.63 ml  Output             4815 ml  Net         -2591.37 ml   LBM: Last BM Date: 06/27/17 Baseline Weight: Weight: 96.2 kg (212 lb 1.3 oz) (Per ED weighing bed prior to CT) Most recent weight: Weight: 98.5 kg (217 lb 2.5 oz)       Palliative Assessment/Data:    Flowsheet Rows     Most Recent Value  Intake Tab  Referral Department  Critical care  Unit at Time of Referral  ICU  Palliative Care Primary Diagnosis  Neurology  Date Notified  06/26/17  Palliative Care Type  New Palliative care  Reason for referral  Clarify Goals of Care, Psychosocial or Spiritual support  Date of Admission  06/20/17  Date first seen by Palliative Care  06/26/17  # of days Palliative referral response time  0 Day(s)  # of days IP prior to Palliative referral  6  Clinical Assessment  Palliative Performance Scale Score  30%  Pain Max last 24 hours  Not able to report  Pain Min Last 24 hours  Not able to  report  Dyspnea Max Last 24 Hours  Not able to report  Dyspnea Min Last 24 hours  Not able to report  Nausea Max Last 24 Hours  Not able to report  Nausea Min Last 24 Hours  Not able to report  Anxiety Max Last 24 Hours  Not able to report  Anxiety Min Last 24 Hours  Not able to report  Other Max Last 24 Hours  Not able to report  Psychosocial & Spiritual Assessment  Palliative Care Outcomes  Patient/Family meeting held?  Yes  Who was at the meeting?  Daughter Vinnie Level. Family mtg planned for 9/9 _1   Palliative Care Outcomes  Provided psychosocial or spiritual support  Palliative Care follow-up planned  Yes, Facility      Patient Active Problem List   Diagnosis Date Noted  . 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 Palliative care by specialist   . Central venous catheter in place   . Pseudoaneurysm of femoral artery ([place])   . Anemia   . Leukocytosis   . Cerebrovascular accident (CVA) ([place])   . Acute ischemic stroke ([place]) 06/20/2017  . Hypothyroidism 06/03/2016  . Polypharmacy 06/03/2016  . H/O amiodarone therapy 02/08/2014  . Chronic systolic heart failure ([place]) 02/08/2014  . Left shoulder pain 01/15/2014  . Chest pain 11/25/2013  . Obesity (BMI 30.0-34.9) 11/06/2013  . H/o Orthostatic hypotension, not on diuretic due to this 10/24/2013  . Generalized anxiety disorder 10/18/2013  . HTN (hypertension) 10/16/2013  . Cardiomyopathy, ischemic- 30-35% on admission 10/09/13 --> increased to 40-45% prior to discharge.  10/16/2013  . Dyslipidemia 10/16/2013  . Paroxysmal atrial fibrillation ([place]) 10/16/2013  . Pulmonary edema 10/11/2013  . Cardiogenic shock- resolved 10/10/2013  . Presence of drug coated stent in LAD coronary artery - 2 overlapping Promx DES 2.5 x 38, 2.5 x 12 (postdilated to ~3 mm) 10/10/2013  . Metabolic acidosis- resolved 10/10/2013  . Acute respiratory failure due to hypoxia from acute pulmonary edema- resolved 10/10/2013  . CAD S/P percutaneous  coronary angioplasty -- PCI to LAD in setting Ant STEMI; 2 overlapping Promx DES 2.5 x 38, 2.5 x 12 (postdilated to ~3 mm) 10/09/2013    Palliative Care Assessment & Plan   Patient Profile: 83 y.o. female  with past medical history of Atrial fibrillation (not on anticoagulation), hypertension, coronary artery disease with history of STEMI 2014, hypothyroidism admitted on 06/20/2017 with new onset of right-sided weakness. Patient's husband reports at the time of admission, he was speaking with her while they were both getting dressed to go to church, when he stepped out of the room he heard her fall ,. Per MRA, new left MCA syndrome. CT of the head was negative for hemorrhage and she was taken to IR for neuro intervention , TPA, Fr sheath. After sheath was removed she developed a delayed hematoma and then pseudoaneurysm to right groin. Right femoral aneurysm was not amenable to injection and patient was taken to the OR for repair. Patient was intubated and transferred to ICU after repair. Patient has had agitation while in the hospital requiring Precedex as well as pressors. Patient extubated on 06/25/2017. On the evening of 06/25/2017 patient developed dropping O2 sats, shortness of breath and she was placed on a nonrebreather. Chest x-ray shows bilateral effusions and edema.   Pt diuresing well, more alert and able to wean for 4 hours today. Assessment: Met with pt's 2 daughters and 2 sons, as well as husband for GOC. Family remains hopeful for recovery however realistic that she is critically ill and other questions going forward may have to be addressed: inability to wean, dysphagia, feeding options  Recommendations/Plan:  Do not re-intubate. Pt would not want a trach  Code status changed to full DNR. All family in agreement  Palliative medicine to stay involved as part of pt's care team to address additional decisions moving forward.   Goals of Care and Additional  Recommendations:  Limitations on Scope of Treatment: No Tracheostomy  Code Status:    Code Status Orders        Start     Ordered   06/26/17 0213  Limited resuscitation (code)  Continuous    Question Answer Comment  In the event of cardiac or respiratory ARREST: Initiate Code Blue, Call Rapid Response No   In the event of cardiac or respiratory ARREST: Perform CPR No   In the event of cardiac or respiratory ARREST: Perform Intubation/Mechanical Ventilation Yes   In the event of cardiac or respiratory ARREST: Use NIPPV/BiPAp only if indicated Yes   In the event of cardiac or respiratory ARREST: Administer ACLS medications if indicated No   In the event of cardiac or respiratory ARREST: Perform Defibrillation or Cardioversion if indicated No      06/26/17 0213    Code Status History    Date Active Date Inactive Code Status Order ID Comments User Context   06/20/2017  5:19 PM 06/26/2017  2:13 AM Full Code [id number]  Luanne Bras, MD Inpatient   06/20/2017  5:19 PM 06/20/2017  5:19 PM Full Code [id number]  Leonel Ramsay,  Vida Roller, MD Inpatient   01/15/2014  8:59 PM 01/16/2014  7:04 PM Full Code [id number]  Charlie Pitter, PA-C Inpatient   11/26/2013  4:59 AM 11/26/2013  3:56 PM Full Code [id number]  Theressa Millard, MD Inpatient       Prognosis:   Unable to determine  Discharge Planning:  To Be Determined  Care plan was discussed with Dr. Titus Mould  Thank you for allowing the Palliative Medicine Team to assist in the care of this patient.   Time In: 1400 Time Out: 1500 Total Time 60 min Prolonged Time Billed  no       Greater than 50%  of this time was spent counseling and coordinating care related to the above assessment and plan.  Dory Horn, NP  Please contact Palliative Medicine Team phone at [phone] for questions and concerns.

## 2017-06-27 NOTE — Progress Notes (Signed)
eLink Physician-Brief Progress Note Patient Name: Melinda Noble DOB: September 23, 1930 MRN: [id number]   Date of Service  06/27/2017  HPI/Events of Note  Potassium 3.1 & phos 1.1. Normal renal function.  eICU Interventions  KPhos 18mmol IV     Intervention Category Major Interventions: Electrolyte abnormality - evaluation and management  Tera Partridge 06/27/2017, 6:57 AM

## 2017-06-27 NOTE — Progress Notes (Signed)
PULMONARY / CRITICAL CARE MEDICINE   Name: Melinda Noble MRN: [id number] DOB: 02-Dec-1929    ADMISSION DATE:  06/20/2017 CONSULTATION DATE:  9/2  REFERRING CV:[id number]   CHIEF COMPLAINT:   Vent management post CVA critical care   HISTORY OF PRESENT ILLNESS:   This is a 83 year old female w/ sig h/o atrial fib (not on systemic A/C). Presented to the ER on 9/2 w/ new (R) sided hemiplegia (she was independent w/ ADLs prior to this).   ER course: MRA ->felt trickle flow to left MCA but no areas of infarct.  -received TPA in the ER-->clinically actually declined.  -repeat CT head-->negative for ICH -went to IR: for cerebral arteriogram and endo vascularization.  -returned to the ICU on vent. PCCM asked to a/w care   SUBJECTIVE:  Re intubated, limited code established Drop in BP, line placed, neo  VITAL SIGNS: BP 138/65   Pulse 80   Temp 98.8 F (37.1 C) (Axillary)   Resp (!) 22   Ht 5\' 8"  (1.727 m)   Wt 98.5 kg (217 lb 2.5 oz)   SpO2 99%   BMI 33.02 kg/m    INTAKE / OUTPUT: I/O last 3 completed shifts: In: 2652.9 [I.V.:1500.2; NG/GT:802.7; IV Piggyback:350] Out: 2820 [Urine:2725; Drains:95]  PHYSICAL EXAMINATION: General: rass 0, bruised rt face Neuro: openes eyes to speech, not fc, confused HEENT: perr, ett PULM: coarse CV:  s1 s2 RRR GI: soft, BS wnl no  Extremities: edema   LABS:  BMET  Recent Labs Lab 06/25/17 1256 06/26/17 0228 06/27/17 0445  NA 141 141 142  K 3.5 3.6 3.1*  CL 111 110 109  CO2 24 24 27   BUN 12 15 34*  CREATININE 0.49 0.66 0.61  GLUCOSE 98 135* 125*   Electrolytes  Recent Labs Lab 06/25/17 1256 06/26/17 0228 06/26/17 0949 06/26/17 1803 06/27/17 0445  CALCIUM 9.2 9.6  --   --  9.2  MG  --   --  1.7 1.6* 1.7  PHOS  --   --  2.9 1.1* 1.1*   CBC  Recent Labs Lab 06/24/17 0410 06/26/17 0228 06/27/17 0445  WBC 7.1 13.0* 10.0  HGB 8.9* 9.3* 8.1*  HCT 27.0* 28.3* 24.9*  PLT 78* 163 172   Coag's  Recent Labs Lab  06/20/17 0819 06/23/17 2335  APTT 32 33  INR 0.94 1.27   Sepsis Markers No results for input(s): LATICACIDVEN, PROCALCITON, O2SATVEN in the last 168 hours.  ABG  Recent Labs Lab 06/24/17 0015 06/26/17 0328 06/26/17 1011  PHART 7.313* 7.249* 7.351  PCO2ART 41.0 57.6* 43.8  PO2ART 120.0* 146* 308.0*   Liver Enzymes  Recent Labs Lab 06/20/17 0819 06/21/17 0349 06/24/17 0410  AST 26  --   --   ALT 23  --   --   ALKPHOS 72  --   --   BILITOT 0.6  --   --   ALBUMIN 3.9 3.0* 2.7*   Cardiac Enzymes  Recent Labs Lab 06/26/17 0949 06/26/17 1803  TROPONINI 1.19* 1.61*    Glucose  Recent Labs Lab 06/26/17 0737 06/26/17 1141 06/26/17 2113 06/26/17 2337 06/27/17 0433 06/27/17 0718  GLUCAP 132* 138* 132* 143* 116* 116*   Imaging  STUDIES:  CT head 9/2: 1. Focal density posteriorly in the left sylvian fissure may reflect contrast associated with a more distal occlusion. 2. No acute hemorrhage. 3. No evidence for progressing infarct. 4. Stable atrophy and white matter disease. 5. Increasing size of right infraorbital hematoma. MCA  9/2:  High grade stenosis or occlusion of the distal M1 seg w/ nonvisualization of the distal MCA branch   CXR 9/2: ET tube in good position, mild venous congestion. No infiltrate.  CULTURES: Sputum 9/8>>>  ANTIBIOTICS: 9/8 levoloxacin>>>  SIGNIFICANT EVENTS: 9/5>> OR for repair of R femoral pseudoaneurysm 9/7 extubated 9/8- pulm edema? Asp? NIMV 9/9 pos balance  LINES/TUBES: ETT 9/2>>>9/4, 9/5>>>9/6  DISCUSSION: Acute left MCA stroke w/ progression in spite of TPA. Now s/p IR for endovascularization. CCM on board for vent management. Extubated 9/6  ASSESSMENT / PLAN:  PULMONARY A: Need for mechanical ventilation for altered mental status.  Reintubated for OR 9/5  abg reviewed PCXR still pending Successfully extubated 9/6, protecting airway 9/8 recurrent resp failure, favor pulm edema, r/o asp P:   Lasix to neg  balance - increase SBT attempts planned, PS 10-5, cpap 5,  No extubation able, when plan extubation will have speak to family about NO reintubation status pcxr in am ABX  CARDIOVASCULAR A:  Mild HTN  H/o afib Converted to rate controlled a-fib 9/7 early am Stress ischemia P:  Neo to map goals, hope to wean to off Line placed yesterday Cont tele monitoring asa Neg balance goals 1 liter daily- increase lasix  RENAL A:   Pulm edema Hypokalemia hypomag hypophos P:   Chem in pm for lytes Lasix rise kvo maintain  GASTROINTESTINAL A:   Inadequate oral intake due to mental status - resolved.  P:   Feeding ppi  HEMATOLOGIC A:   No acute w/out evidence of bleeding Thrombocytopenia>> ? Consumptive from hematoma formation - improved Bloody drainage from JP ( 70 cc last pm)  P:  Trend cbc in am  scd as able  INFECTIOUS A:   At risk aspiration? 9/8 P:   Sputum follow Levofloxacin with allergies  ENDOCRINE A:   Hypothyroidism Hypoglycemic overnight 9/7 P:  CBG's Q 4  IV Synthroid  NEUROLOGIC A:   Left MCA stroke w/ right sided hemiplegia. Now s/p TPA and revascularization in IR P:   Supportive care  Per neuro  No prop on neo fent   FAMILY  - Updates:  daughter updated at bedside 9/7.  Updated daughter on dedclines 9/8, 9/9 done  Ccm time 30 min   Lavon Paganini. Titus Mould, MD, [place] Pgr: [facility] 06/27/2017 7:52 AM

## 2017-06-27 NOTE — Progress Notes (Signed)
[facility]  Subjective  - on vent sedated   Objective 128/65 79 98.8 F (37.1 C) (Axillary) 10 97%  Intake/Output Summary (Last 24 hours) at 06/27/17 0853 Last data filed at 06/27/17 0700  Gross per 24 hour  Intake          2292.87 ml  Output             1565 ml  Net           727.87 ml   Right groin incision healing.  JP 40 over 24 hr Foot pink warm doppler signal  Assessment/Planning: S/p pseudoaneurysm repair right femoral artery D/c drain today Pt will need follow up with me in 2 weeks.  If she remains in hospital we will see her later this week Call if questions  Ruta Hinds 06/27/2017 8:53 AM --  Laboratory Lab Results:  Recent Labs  06/26/17 0228 06/27/17 0445  WBC 13.0* 10.0  HGB 9.3* 8.1*  HCT 28.3* 24.9*  PLT 163 172   BMET  Recent Labs  06/26/17 0228 06/27/17 0445  NA 141 142  K 3.6 3.1*  CL 110 109  CO2 24 27  GLUCOSE 135* 125*  BUN 15 34*  CREATININE 0.66 0.61  CALCIUM 9.6 9.2    COAG Lab Results  Component Value Date   INR 1.27 06/23/2017   INR 0.94 06/20/2017   INR 1.03 11/25/2013   No results found for: PTT

## 2017-06-27 NOTE — Progress Notes (Signed)
STROKE TEAM PROGRESS NOTE   SUBJECTIVE (INTERVAL HISTORY) Daughter and husband and friend who is retired GI physician at bedside. Pt has been doing well overnight, now on fentanyl but awake alert, interactive, but still aphasia with mild right side weakness.    OBJECTIVE Temp:  [97.2 F (36.2 C)-100.7 F (38.2 C)] 98.8 F (37.1 C) (09/09 0745) Pulse Rate:  [38-98] 80 (09/09 0700) Cardiac Rhythm: Normal sinus rhythm (09/09 0400) Resp:  [18-36] 22 (09/09 0700) BP: (70-190)/(34-97) 138/65 (09/09 0700) SpO2:  [95 %-100 %] 99 % (09/09 0700) FiO2 (%):  [40 %-100 %] 40 % (09/09 0400) Weight:  [217 lb 2.5 oz (98.5 kg)] 217 lb 2.5 oz (98.5 kg) (09/09 0600)  CBC:   Recent Labs Lab 06/21/17 0349  06/26/17 0228 06/27/17 0445  WBC 13.8*  < > 13.0* 10.0  NEUTROABS 11.1*  --   --  6.9  HGB 11.9*  < > 9.3* 8.1*  HCT 37.4  < > 28.3* 24.9*  MCV 94.7  < > 93.4 92.6  PLT 174  < > 163 172  < > = values in this interval not displayed.  Basic Metabolic Panel:   Recent Labs Lab 06/26/17 0228  06/26/17 1803 06/27/17 0445  NA 141  --   --  142  K 3.6  --   --  3.1*  CL 110  --   --  109  CO2 24  --   --  27  GLUCOSE 135*  --   --  125*  BUN 15  --   --  34*  CREATININE 0.66  --   --  0.61  CALCIUM 9.6  --   --  9.2  MG  --   < > 1.6* 1.7  PHOS  --   < > 1.1* 1.1*  < > = values in this interval not displayed.  Lipid Panel:     Component Value Date/Time   CHOL 133 06/21/2017 0349   CHOL 152 01/26/2017 0855   TRIG 99 06/26/2017 0949   HDL 45 06/21/2017 0349   HDL 56 01/26/2017 0855   CHOLHDL 3.0 06/21/2017 0349   VLDL 24 06/21/2017 0349   LDLCALC 64 06/21/2017 0349   LDLCALC 78 01/26/2017 0855   HgbA1c:  Lab Results  Component Value Date   HGBA1C 5.4 06/21/2017   Urine Drug Screen:     Component Value Date/Time   LABOPIA NONE DETECTED 06/20/2017 2201   COCAINSCRNUR NONE DETECTED 06/20/2017 2201   LABBENZ POSITIVE (A) 06/20/2017 2201   AMPHETMU NONE DETECTED 06/20/2017  2201   THCU NONE DETECTED 06/20/2017 2201   LABBARB NONE DETECTED 06/20/2017 2201    Alcohol Level No results found for: [place] I have personally reviewed the radiological images below and agree with the radiology interpretations.  Ct Head Code Stroke Wo Contrast 06/20/2017 IMPRESSION:  1. No significant interval change.  2. Stable atrophy and white matter disease.  3. No definite hyperdense vessel.  4. Stable right periorbital hematoma.  5. ASPECTS is 10/10   06/20/2017 IMPRESSION:  1. Stable atrophy and white matter disease.  2. No acute intracranial abnormality or significant interval change.  3. Prominent right infraorbital and lateral soft tissue hematoma without underlying fracture or intraorbital extension.  4. ASPECTS is 10/10  06/20/2017 IMPRESSION:  1. Focal density posteriorly in the left sylvian fissure may reflect contrast associated with a more distal occlusion.  2. No acute hemorrhage.  3. No evidence for progressing infarct.  4.  Stable atrophy and white matter disease.  5. Increasing size of right infraorbital hematoma.   06/21/2017 1. New left posterolateral temporal lobe acute infarction.  2. Stable infarction centered in left frontal centrum semiovale extending into left superior lentiform nucleus and insula in comparison with prior CT/MRI head. Interval increase in edema and local mass effect associated with the infarct.  3. No acute intracranial hemorrhage.   Ct Maxillofacial Wo Contrast 06/20/2017 IMPRESSION:  1. No maxillofacial fracture.  2. Right infraorbital and periorbital soft tissue hematoma.   [name] Head Wo Contrast 06/20/2017 IMPRESSION:  1. High-grade stenosis or occlusion of the distal left M1 segment with nonvisualization of distal MCA branch vessels. This corresponds with the patient's acute symptoms, likely representing an emergent large vessel occlusion.  2. Narrowing of the right M1 segment and attenuation of MCA branch vessels likely  related to atherosclerotic disease.  3. Posterior circulation is intact.  4. Ill-defined area of restricted diffusion within white matter of the left centrum semi ovale may reflect acute ischemia. No definite cortical infarct is present.  Mr Brain Wo Contrast 06/21/2017 IMPRESSION:  1. Increased size of acute/subacute left MCA territory infarct, now involving the left insular cortex, left frontal operculum, posterior left temporal and parietal lobe, and more anterior superior left frontal lobe.  2. No evidence for hemorrhagic conversion of left-sided infarcts.  3. Focal acute nonhemorrhagic infarct in the right parietal lobe and centrum semiovale. Bilateral disease raises concern for a central cardio embolic source.  4. Right periorbital hematoma.   TTE 06/20/2017  Left ventricle: The cavity size was normal. Wall thickness was   normal. Systolic function was mildly reduced. The estimated   ejection fraction was in the range of 45% to 50%. There is   akinesis of the apical myocardium. Doppler parameters are   consistent with abnormal left ventricular relaxation (grade 1   diastolic dysfunction).   LE arterial doppler - Pseudoaneurysm of the right groin measuring 4.68 x 4.11 cm with surrounding hematoma. Pseudo neck measures 0.58 cm and is 1.36 cm long.  UE venous doppler - There is no DVT or SVT noted in the bilateral upper extremities.     PHYSICAL EXAM Vitals:   06/27/17 0500 06/27/17 0600 06/27/17 0700 06/27/17 0745  BP: 134/78 (!) 80/51 138/65   Pulse: 81 69 80   Resp: (!) 22 (!) 22 (!) 22   Temp:    98.8 F (37.1 C)  TempSrc:    Axillary  SpO2: 99% 99% 99%   Weight:  217 lb 2.5 oz (98.5 kg)    Height:  5\' 8"  (1.727 m)      Temp:  [98.5 F (36.9 C)-100.7 F (38.2 C)] 98.8 F (37.1 C) (09/09 0745) Pulse Rate:  [38-87] 67 (09/09 1200) Resp:  [9-23] 18 (09/09 1200) BP: (74-158)/(34-78) 96/47 (09/09 1200) SpO2:  [96 %-100 %] 97 % (09/09 1201) FiO2 (%):  [40 %-60 %] 40 %  (09/09 0715) Weight:  [217 lb 2.5 oz (98.5 kg)] 217 lb 2.5 oz (98.5 kg) (09/09 0600)  General - Well nourished, well developed, intubated, on sedation  Ophthalmologic - Fundi not visualized due to noncooperation.  Cardiovascular - Regular rate and rhythm.  Neuro - awake, alert, eyes open, interactive, try to mouth with tube. However, not following commands, seems to have aphasia vs. On sedation, but able to pantomime. Eyes middle position, incomplete horizontal eye movement. PERRL, positive corneal and gag, blinking to visual threat bilaterally. Facial symmetry not able to test due  to ET tube. RUE 4+/5 but no drift, left UE 5/5. RLE 3/5 and LLE 4+/5. DTR 1+ and no babinski. Sensation, coordination and gait not tested.   ASSESSMENT/PLAN Ms. VALTA DILLON is a 83 y.o. female with history of history of PAF not on AC, HTN, CAD/MI s/p stent, CHF, HLD who presents with right sided weakness. She is status post IV tPA 0941 on 06/20/2017 and IR mechanical thrombectomy.  Stroke: acute/subacute L MCA infarct, embolic, likely due to afib not on AC, s/p tPA and mechanical thrombectomy with TICI 2b reperfusion  Resultant intubated  CT head: no acute stroke.  Prominent right infraorbital and lateral soft tissue hematoma without underlying fracture or intraorbital extension  MRI head: acute/subacute L MCA infarct, involving the left insular cortex, left frontal operculum, posterior left temporal and parietal lobe, and more anterior superior left frontal lobe.  Focal acute nonhemorrhagic infarct in the right parietal lobe and centrum semiovale.  MRA head: High-grade L M1 stenosis.  Ill-defined area of restricted diffusion within white matter of the left centrum semi ovale may reflect acute ischemia  IR / DSA - left M1 mechanical thrombectomy with TICI2b reperfusion  TTE EF 45% to 50%  UE venous doppler - no DVT  LDL 64  HgbA1c 5.4  SCDs for VTE prophylaxis Diet NPO time specified  aspirin 81 mg  daily prior to admission, now on aspirin 81mg  and plavix 75mg  daily. Consider AC in 7-10 days and medical condition stabilized with no procedure planned  Patient counseled to be compliant with her antithrombotic medications  Ongoing aggressive stroke risk factor management  Therapy recommendations:   pending  Disposition:  Pending  PAF not on AC  AF pre-MI but not sure about PAF after MI but on amiodarone   Follows with Dr. Tamala Julian in cardiology  On ASA now  Consider AC in 7-10 days and medical condition stabilized with no procedure planned.  Respiratory failure  Intubated - extubated - re-intubated  CXR - pulmonary edema and cardiomegaly  Continue diuresis  CCM on board  Right femoral pseudoaneurysm  S/p emergency repair 06/23/17  VVS on board, appreciate help  D/c right groin JP drain today as per VVS  Hypotension  BP low  Still on Neo  CVP monitoring  Long-term BP goal normotensive  Anemia, acute blood loss  Hb 9.4->6.1->7.8->8.9->9.3->8.1  Close CBC monitoring  Continue ASA and plavix  Hyperlipidemia  Home meds: Rosuvastatin 5 mg daily  LDL 64, goal < 70  On crestor 10mg  now  Continue statin at discharge  Other Stroke Risk Factors  Advanced age  Obesity, Body mass index is 33.02 kg/m., recommend weight loss, diet and exercise as appropriate   CAD/MI s/p stent - on ASA now  Other Active Problems  Leukocytosis 7.1 - 13.0  Low-grade fever - Tmax 100.7  Palliative Care consult today - Family meeting scheduled for 06/27/2017 at 2 PM to further address goals of care specifically one way extubation  Hospital day # 7  This patient is critically ill and at significant risk of neurological worsening, death and care requires constant monitoring of vital signs, hemodynamics,respiratory and cardiac monitoring, extensive review of multiple databases, frequent neurological assessment, discussion with family, other specialists and medical decision  making of high complexity.I have made any additions or clarifications directly to the above note.This critical care time does not reflect procedure time, or teaching time or supervisory time of PA/NP/Med Resident etc but could involve care discussion time. I spent 40 minutes of neurocritical  care time  in the care of  this patient.  Rosalin Hawking, MD PhD Stroke Neurology 06/27/2017 12:27 PM   To contact Stroke Continuity provider, please refer to [email or web address] After hours, contact General Neurology

## 2017-06-27 NOTE — Progress Notes (Signed)
eLink Physician-Brief Progress Note Patient Name: Melinda Noble DOB: 1929/12/24 MRN: [id number]   Date of Service  06/27/2017  HPI/Events of Note  K+ = 2.8 and Creatinine = 0.55.  eICU Interventions  Will replace K+.     Intervention Category Major Interventions: Electrolyte abnormality - evaluation and management  Korey Prashad Cornelia Copa 06/27/2017, 8:37 PM

## 2017-06-28 ENCOUNTER — Inpatient Hospital Stay (HOSPITAL_COMMUNITY): Payer: Medicare Other

## 2017-06-28 DIAGNOSIS — E785 Hyperlipidemia, unspecified: Secondary | ICD-10-CM

## 2017-06-28 DIAGNOSIS — J9600 Acute respiratory failure, unspecified whether with hypoxia or hypercapnia: Secondary | ICD-10-CM

## 2017-06-28 LAB — BASIC METABOLIC PANEL
ANION GAP: 5 (ref 5–15)
BUN: 34 mg/dL — ABNORMAL HIGH (ref 6–20)
CHLORIDE: 107 mmol/L (ref 101–111)
CO2: 30 mmol/L (ref 22–32)
CREATININE: 0.63 mg/dL (ref 0.44–1.00)
Calcium: 9.1 mg/dL (ref 8.9–10.3)
GFR calc Af Amer: >60 mL/min (ref >60)
GFR calc non Af Amer: >60 mL/min (ref >60)
Glucose, Bld: 142 mg/dL — ABNORMAL HIGH (ref 65–99)
POTASSIUM: 3.5 mmol/L (ref 3.5–5.1)
Sodium: 142 mmol/L (ref 135–145)

## 2017-06-28 LAB — CBC WITH DIFFERENTIAL/PLATELET
Basophils Absolute: 0 10*3/uL (ref 0.0–0.1)
Basophils Relative: 0 %
Eosinophils Absolute: 0.3 10*3/uL (ref 0.0–0.7)
Eosinophils Relative: 4 %
HEMATOCRIT: 26.2 % — AB (ref 36.0–46.0)
HEMOGLOBIN: 8.2 g/dL — AB (ref 12.0–15.0)
LYMPHS ABS: 1.6 10*3/uL (ref 0.7–4.0)
LYMPHS PCT: 17 %
MCH: 29.4 pg (ref 26.0–34.0)
MCHC: 31.3 g/dL (ref 30.0–36.0)
MCV: 93.9 fL (ref 78.0–100.0)
MONOS PCT: 12 %
Monocytes Absolute: 1.1 10*3/uL — ABNORMAL HIGH (ref 0.1–1.0)
NEUTROS ABS: 6.1 10*3/uL (ref 1.7–7.7)
NEUTROS PCT: 67 %
Platelets: 196 10*3/uL (ref 150–400)
RBC: 2.79 MIL/uL — AB (ref 3.87–5.11)
RDW: 15.7 % — ABNORMAL HIGH (ref 11.5–15.5)
WBC: 9.1 10*3/uL (ref 4.0–10.5)

## 2017-06-28 LAB — CULTURE, RESPIRATORY W GRAM STAIN: Culture: NORMAL

## 2017-06-28 LAB — GLUCOSE, CAPILLARY
GLUCOSE-CAPILLARY: 130 mg/dL — AB (ref 65–99)
GLUCOSE-CAPILLARY: 137 mg/dL — AB (ref 65–99)
GLUCOSE-CAPILLARY: 145 mg/dL — AB (ref 65–99)
Glucose-Capillary: 141 mg/dL — ABNORMAL HIGH (ref 65–99)
Glucose-Capillary: 143 mg/dL — ABNORMAL HIGH (ref 65–99)
Glucose-Capillary: 143 mg/dL — ABNORMAL HIGH (ref 65–99)
Glucose-Capillary: 150 mg/dL — ABNORMAL HIGH (ref 65–99)

## 2017-06-28 LAB — PHOSPHORUS
Phosphorus: 1.6 mg/dL — ABNORMAL LOW (ref 2.5–4.6)
Phosphorus: 3.5 mg/dL (ref 2.5–4.6)

## 2017-06-28 LAB — CULTURE, RESPIRATORY

## 2017-06-28 LAB — MAGNESIUM: MAGNESIUM: 1.8 mg/dL (ref 1.7–2.4)

## 2017-06-28 MED ORDER — POTASSIUM PHOSPHATES 15 MMOLE/5ML IV SOLN
30.0000 mmol | Freq: Once | INTRAVENOUS | Status: AC
  Administered 2017-06-28: 30 mmol via INTRAVENOUS
  Filled 2017-06-28: qty 10

## 2017-06-28 MED ORDER — FUROSEMIDE 10 MG/ML IJ SOLN
40.0000 mg | Freq: Once | INTRAMUSCULAR | Status: AC
  Administered 2017-06-28: 40 mg via INTRAVENOUS
  Filled 2017-06-28: qty 4

## 2017-06-28 NOTE — Progress Notes (Signed)
Referring Physician(s):  Dr. Leonie Man  Supervising Physician: Luanne Bras  Patient Status:  [facility] - In-pt  Chief Complaint:   MCA CVA  Subjective: Intubated. Alert and interactive. Moving all extremities.   Allergies: Ace inhibitors; Beta adrenergic blockers; Iohexol; Atorvastatin; Latex; Penicillins; and Sulfa antibiotics  Medications: Prior to Admission medications   Medication Sig Start Date End Date Taking? Authorizing Provider  acetaminophen (TYLENOL) 325 MG tablet Take 650 mg by mouth every 6 (six) hours as needed for mild pain.   Yes [provider]  ALPRAZolam (XANAX) 0.25 MG tablet TAKE (1) TABLET TWICE DAILY AS NEEDED FOR ANXIETY. Patient taking differently: TAKE HALF TABLET (0.125mg ) TWICE DAILY AS NEEDED FOR ANXIETY. 04/19/17  Yes Reed, Tiffany L, DO  aspirin 81 MG chewable tablet Chew 1 tablet (81 mg total) by mouth daily. 10/17/13  Yes Lyda Jester M, PA-C  Biotin 1000 MCG tablet Take 1,000 mcg by mouth daily.   Yes [provider]  buPROPion (WELLBUTRIN SR) 150 MG 12 hr tablet Take 150 mg by mouth every morning.  08/22/16  Yes [provider]  calcium-vitamin D (OSCAL WITH D) 500-200 MG-UNIT per tablet Take 1 tablet by mouth daily.    Yes [provider]  carvedilol (COREG) 6.25 MG tablet Take 1 tablet (6.25 mg total) by mouth 2 (two) times daily with a meal. 04/09/14  Yes Leonie Man, MD  cholecalciferol (VITAMIN D) 1000 UNITS tablet Take 1,000 Units by mouth daily.   Yes [provider]  clobetasol ointment (TEMOVATE) 0.34 % Apply 1 application topically 2 (two) times daily as needed. (affected areas)--also thighs 07/29/16  Yes Reed, Tiffany L, DO  clopidogrel (PLAVIX) 75 MG tablet Take 75 mg by mouth daily.   Yes [provider]  fexofenadine (ALLEGRA) 60 MG tablet Take 60 mg by mouth 2 (two) times daily as needed for allergies or rhinitis.   Yes [provider]  furosemide (LASIX) 20 MG  tablet TAKE 1/2 TABLET DAILY. Patient taking differently: TAKE 1/2 TABLET (10mg ) DAILY. 05/03/17  Yes Reed, Tiffany L, DO  losartan (COZAAR) 100 MG tablet Take 1 tablet (100 mg total) by mouth daily. 12/30/16 06/21/17 Yes Belva Crome, MD  montelukast (SINGULAIR) 10 MG tablet Take 10 mg by mouth at bedtime.   Yes [provider]  Multiple Vitamin (MULTIVITAMIN WITH MINERALS) TABS tablet Take 1 tablet by mouth daily.   Yes [provider]  nitroGLYCERIN (NITROSTAT) 0.4 MG SL tablet Place 0.4 mg under the tongue every 5 (five) minutes as needed for chest pain.   Yes [provider]  omeprazole (PRILOSEC) 20 MG capsule Take 20 mg by mouth daily.    Yes [provider]  Polyethyl Glycol-Propyl Glycol (SYSTANE OP) Apply 1 drop to eye daily as needed (dry eyes).   Yes [provider]  rosuvastatin (CRESTOR) 5 MG tablet Take 10 mg by mouth 3 (three) times a week. (Mondays, Wednesdays, and Fridays)   Yes [provider]  sertraline (ZOLOFT) 100 MG tablet Take 100 mg by mouth daily.  07/06/16  Yes [provider]  SYNTHROID 100 MCG tablet TAKE 1 TABLET IN THE MORNING ON AN EMPTY STOMACH. Patient taking differently: TAKE 1 TABLET (148mcg) IN THE MORNING ON AN EMPTY STOMACH. 05/17/17  Yes Reed, Tiffany L, DO  vitamin E (VITAMIN E) 400 UNIT capsule Take 400 Units by mouth daily.   Yes [provider]     Vital Signs: BP (!) 84/59   Pulse  71   Temp 98.5 F (36.9 C) (Axillary)   Resp 15   Ht 5\' 8"  (1.727 m)   Wt 213 lb 13.5 oz (97 kg)   SpO2 96%   BMI 32.52 kg/m   Physical Exam  Intubated, alert Neuro:  Moving all extremities.  Able to nod to questions and follows some commands.  Groin:  Soft, bruising improved, serosanguinous drainage from JP removal site.   Imaging: Dg Chest Port 1 View  Result Date: 06/28/2017 CLINICAL DATA:  Acute respiratory failure.  Ischemic stroke. EXAM: PORTABLE CHEST 1 VIEW COMPARISON:  Yesterday  FINDINGS: Endotracheal tube tip at the clavicular heads. Left subclavian central line at the brachiocephalic SVC confluence. A feeding tube at least reaches the diaphragm. Haziness of the left more than right chest with left diaphragm obscured due to retrocardiac lung opacity. Cardiomegaly. Coronary stent present. No pneumothorax. IMPRESSION: 1. Stable positioning of tubes and central line. 2. Hazy opacity at the left more than right base favoring atelectasis and pleural fluid. Electronically Signed   By: Monte Fantasia M.D.   On: 06/28/2017 07:17   Dg Chest Port 1 View  Result Date: 06/27/2017 CLINICAL DATA:  Pulmonary edema. EXAM: PORTABLE CHEST 1 VIEW COMPARISON:  Radiograph of June 26, 2017. FINDINGS: Stable cardiomegaly. Endotracheal and feeding tubes are unchanged in position. Left subclavian catheter is unchanged in position. No pneumothorax is noted. Stable hazy opacity seen in right lung which may represent small pleural effusion. Mildly increased left basilar opacity is noted concerning for atelectasis or pneumonia with possible pleural effusion. IMPRESSION: Stable support apparatus. Mildly increased left basilar opacity concerning for atelectasis or pneumonia with possible associated pleural effusion. Electronically Signed   By: Marijo Conception, M.D.   On: 06/27/2017 08:04   Dg Chest Port 1 View  Result Date: 06/26/2017 CLINICAL DATA:  Stroke, central line EXAM: PORTABLE CHEST 1 VIEW COMPARISON:  06/26/2017 FINDINGS: Endotracheal tube tip is about 3 cm superior to the carina. Placement of a left-sided central venous catheter with tip overlying the SVC origin. No left pneumothorax is seen. Esophageal tube extends below diaphragm but is not included on the image. Stable cardiomegaly. Slightly improved aeration of the lung bases. Mild right greater than left hazy pulmonary opacity persists. Suspect that there are small effusions. IMPRESSION: 1. Left-sided central venous catheter tip overlies the  SVC origin. No left pneumothorax 2. Slightly improved aeration of the lung bases. Small pleural effusions and hazy right greater than left edema or infiltrates are otherwise unchanged Electronically Signed   By: Donavan Foil M.D.   On: 06/26/2017 19:11   Dg Chest Port 1 View  Result Date: 06/26/2017 CLINICAL DATA:  ETT placement  Hx of CAD, HTN EXAM: PORTABLE CHEST 1 VIEW COMPARISON:  Chest x-ray from earlier same day. FINDINGS: Endotracheal tube is appropriately position with tip approximately 3 cm above the level of the carina. Enteric tube passes below the diaphragm. Heart size and mediastinal contours are stable in the short-term interval. Persistent pulmonary edema pattern, unchanged in the short-term interval. Probable small bilateral pleural effusions and/or atelectasis at each lung base. No pneumothorax seen. IMPRESSION: 1. Endotracheal tube is well positioned with tip just above the level of the carina. 2. No other change in the short-term interval. Persistent pulmonary edema pattern. Probable small bilateral pleural effusions and/or atelectasis. Electronically Signed   By: Franki Cabot M.D.   On: 06/26/2017 09:15   Dg Chest Port 1 View  Result Date: 06/26/2017 CLINICAL DATA:  Respiratory failure  EXAM: PORTABLE CHEST 1 VIEW COMPARISON:  June 25, 2017 FINDINGS: No pneumothorax. Cardiomegaly is stable. The hila and mediastinum are unchanged. Pulmonary edema persists, slightly improved in the interval. Probable small bilateral effusions with underlying atelectasis. IMPRESSION: Cardiomegaly. Pulmonary edema persists, slightly improved in the interval. No other change. Electronically Signed   By: Dorise Bullion III M.D   On: 06/26/2017 07:03   Dg Chest Port 1 View  Result Date: 06/25/2017 CLINICAL DATA:  Shortness of breath tonight. EXAM: PORTABLE CHEST 1 VIEW COMPARISON:  Chest radiograph June 23, 2017 FINDINGS: The cardiac silhouette is moderately enlarged and unchanged. Mediastinal  silhouette is nonsuspicious, accentuated by rotation to the RIGHT. Pulmonary vascular congestion and interstitial prominence with new LEFT lung base airspace opacity. Small pleural effusions. No pneumothorax. Feeding tube past proximal stomach, distal tip not included. Soft tissue planes and included osseous structures are unchanged. Interval extubation removal of temperature probe and RIGHT IJ catheter. IMPRESSION: Stable cardiomegaly. New pulmonary edema and LEFT lung base consolidation. Small pleural effusions. Interval extubation and removal of IJ catheter. Electronically Signed   By: Elon Alas M.D.   On: 06/25/2017 23:02   Dg Swallowing Func-speech Pathology  Result Date: 06/25/2017 Objective Swallowing Evaluation: Type of Study: MBS-Modified Barium Swallow Study Patient Details Name: SHASTA CHINN MRN: [id number] Date of Birth: Apr 01, 1930 Today's Date: 06/25/2017 Time: SLP Start Time (ACUTE ONLY): 1415-SLP Stop Time (ACUTE ONLY): 1430 SLP Time Calculation (min) (ACUTE ONLY): 15 min Past Medical History: Past Medical History: Diagnosis Date . Anemia  . CAD (coronary artery disease)   a. STEMI 10/09/13 - 100% LAD - following initial angioplasty, ? Spontaneous dissection vs related to ballon. s/p complex PCI with 2 overlapping DES to LAD. Adm complicated by VDRF, cardiogenic shock, peri-MI PAF with NSR on amiodarone. Residual mod RCA disease.  Marland Kitchen CAD S/P percutaneous coronary angioplasty 10/09/13  DES x 2 to Mid LAD, 2.5 x 38, 2.5 x 12 Promus (covering extensive disection), moderate RCA disesase . Cardiogenic shock ([place])   a. 09/2013 - during admission for STEMI. . Depression  . Dyslipidemia  . H/O Acute respiratory failure due to hypoxia 10/10/2013  a. 09/2013 - secondary to anterior STEMI related pulmonary edema (resolved). . Hypertension  . Hypothyroidism  . Ischemic cardiomyopathy   a. EF 30-35% during 09/2013 adm for STEMI. b. Repeat Echo EF 40-45% with distal LAD hypokinesis (10/16/14 following STEMI  recovery). . Migraine headache   a. Remotely. . Orthostatic hypotension   a. Adm 10/2013 for this, felt due to Lasix. Not on regular diuretic due to this/dehydration. Marland Kitchen PAF (paroxysmal atrial fibrillation) ([place])   a. 09/2013 - peri-MI, NSR at discharge on amiodarone. . Sinus bradycardia  Past Surgical History: Past Surgical History: Procedure Laterality Date . CORONARY ANGIOPLASTY WITH STENT PLACEMENT  10/09/13  2 overlapping Promus DES to mid LAD, occlusion/dissection (2.5 x 38, 2.5 x 12) . FEMORAL ARTERY EXPLORATION Right 06/23/2017  Procedure: repair right femoral pseudoanerysm; evacuation hemotoma;  Surgeon: Elam Dutch, MD;  Location: [facility] OR;  Service: Vascular;  Laterality: Right; . IR ANGIO INTRA EXTRACRAN SEL COM CAROTID INNOMINATE UNI R MOD SED  06/20/2017 . IR PERCUTANEOUS ART THROMBECTOMY/INFUSION INTRACRANIAL INC DIAG ANGIO  06/20/2017 . IR US GUIDE BX ASP/DRAIN  06/23/2017 . LEFT HEART CATHETERIZATION WITH CORONARY ANGIOGRAM N/A 10/09/2013  Procedure: LEFT HEART CATHETERIZATION WITH CORONARY ANGIOGRAM;  Surgeon: Leonie Man, MD;  Location: [facility] CATH LAB;  Service: Cardiovascular;  Laterality: N/A; . RADIOLOGY WITH ANESTHESIA N/A 06/20/2017  Procedure: RADIOLOGY  WITH ANESTHESIA;  Surgeon: Luanne Bras, MD;  Location: [place];  Service: Radiology;  Laterality: N/A; . SHOULDER SURGERY   HPI: 83 year old female w/ sig h/o atrial fib (not on systemic A/C). Presented to the ER on 9/2 w/ new (R) sided hemiplegia.  Dx  left MCA CVA, S/P endovascularization in IR.  ETT 9/2-9/4.   MRI: acute/subacute L MCA infarct, involving L insular cortex, L frontal operculum, L posterior temporal and parietal lobe, and more anterior L superior frontal lobe, as well as acute infarct R parietal lobe and Rcentrum semiovale, embolic, in the setting of atheorsclerosis. Pt subsequently with right groin hematoma; to OR 9/5 for repair femoral artery pseudoaneurysm; ETT 9/5-9/6.  Subjective: alert, attempting to talk Assessment / Plan  / Recommendation CHL IP CLINICAL IMPRESSIONS 06/25/2017 Clinical Impression Pt presents with a moderate-severe sensorimotor oropharyngeal dysphagia c/b poor oral control of POs, diffuse spreading throughout cavity, and uncontrolled spillage of all materials into pharynx.  Purees, honey, and nectar-thick liquids are all aspirated before the swallow.  There is diffuse residue throughout pharynx after the swallow response.  Pt has a delayed cough in reaction to aspiration, but it occurs well after the aspirate has descended into the trachea.  Study was reviewed at length with pt's daughter, Vinnie Level.  For now, continue NPO status with cortrak for nutrition.  After discussion with Dr. Leonie Man, decision made to repeat MBS on Monday, 9/10 to determine improvement, necessity of PEG.  SLP Visit Diagnosis Dysphagia, oropharyngeal phase (R13.12) Attention and concentration deficit following -- Frontal lobe and executive function deficit following -- Impact on safety and function Severe aspiration risk   CHL IP TREATMENT RECOMMENDATION 06/25/2017 Treatment Recommendations Therapy as outlined in treatment plan below   Prognosis 06/25/2017 Prognosis for Safe Diet Advancement Fair Barriers to Reach Goals -- Barriers/Prognosis Comment -- CHL IP DIET RECOMMENDATION 06/25/2017 SLP Diet Recommendations NPO;Alternative means - temporary Liquid Administration via -- Medication Administration -- Compensations -- Postural Changes --   CHL IP OTHER RECOMMENDATIONS 06/25/2017 Recommended Consults -- Oral Care Recommendations Oral care QID Other Recommendations --   CHL IP FOLLOW UP RECOMMENDATIONS 06/25/2017 Follow up Recommendations Skilled Nursing facility   [facility] IP FREQUENCY AND DURATION 06/25/2017 Speech Therapy Frequency (ACUTE ONLY) min 3x week Treatment Duration 2 weeks      CHL IP ORAL PHASE 06/25/2017 Oral Phase Impaired Oral - Pudding Teaspoon -- Oral - Pudding Cup -- Oral - Honey Teaspoon Right anterior bolus loss;Impaired mastication;Weak lingual  manipulation;Incomplete tongue to palate contact;Reduced posterior propulsion;Holding of bolus;Right pocketing in lateral sulci;Pocketing in anterior sulcus;Piecemeal swallowing;Delayed oral transit;Decreased bolus cohesion;Premature spillage Oral - Honey Cup -- Oral - Nectar Teaspoon Right anterior bolus loss;Impaired mastication;Weak lingual manipulation;Incomplete tongue to palate contact;Reduced posterior propulsion;Holding of bolus;Right pocketing in lateral sulci;Pocketing in anterior sulcus;Piecemeal swallowing;Delayed oral transit;Decreased bolus cohesion;Premature spillage Oral - Nectar Cup -- Oral - Nectar Straw -- Oral - Thin Teaspoon -- Oral - Thin Cup -- Oral - Thin Straw -- Oral - Puree Right anterior bolus loss;Impaired mastication;Weak lingual manipulation;Incomplete tongue to palate contact;Reduced posterior propulsion;Holding of bolus;Right pocketing in lateral sulci;Pocketing in anterior sulcus;Piecemeal swallowing;Delayed oral transit;Decreased bolus cohesion;Premature spillage Oral - Mech Soft -- Oral - Regular -- Oral - Multi-Consistency -- Oral - Pill -- Oral Phase - Comment --  CHL IP PHARYNGEAL PHASE 06/25/2017 Pharyngeal Phase Impaired Pharyngeal- Pudding Teaspoon -- Pharyngeal -- Pharyngeal- Pudding Cup -- Pharyngeal -- Pharyngeal- Honey Teaspoon Delayed swallow initiation-pyriform sinuses;Reduced pharyngeal peristalsis;Reduced epiglottic inversion;Reduced anterior laryngeal mobility;Reduced laryngeal elevation;Reduced airway/laryngeal  closure;Reduced tongue base retraction;Penetration/Aspiration before swallow;Moderate aspiration;Pharyngeal residue - valleculae;Pharyngeal residue - pyriform Pharyngeal Material enters airway, passes BELOW cords and not ejected out despite cough attempt by patient Pharyngeal- Honey Cup -- Pharyngeal -- Pharyngeal- Nectar Teaspoon Delayed swallow initiation-pyriform sinuses;Reduced pharyngeal peristalsis;Reduced epiglottic inversion;Reduced anterior laryngeal  mobility;Reduced laryngeal elevation;Reduced airway/laryngeal closure;Reduced tongue base retraction;Penetration/Aspiration before swallow;Moderate aspiration;Pharyngeal residue - valleculae;Pharyngeal residue - pyriform Pharyngeal Material enters airway, passes BELOW cords and not ejected out despite cough attempt by patient Pharyngeal- Nectar Cup -- Pharyngeal -- Pharyngeal- Nectar Straw -- Pharyngeal -- Pharyngeal- Thin Teaspoon -- Pharyngeal -- Pharyngeal- Thin Cup -- Pharyngeal -- Pharyngeal- Thin Straw -- Pharyngeal -- Pharyngeal- Puree Delayed swallow initiation-pyriform sinuses;Reduced pharyngeal peristalsis;Reduced epiglottic inversion;Reduced anterior laryngeal mobility;Reduced laryngeal elevation;Reduced airway/laryngeal closure;Reduced tongue base retraction;Penetration/Aspiration before swallow;Moderate aspiration;Pharyngeal residue - valleculae;Pharyngeal residue - pyriform Pharyngeal -- Pharyngeal- Mechanical Soft -- Pharyngeal -- Pharyngeal- Regular -- Pharyngeal -- Pharyngeal- Multi-consistency -- Pharyngeal -- Pharyngeal- Pill -- Pharyngeal -- Pharyngeal Comment --  No flowsheet data found. No flowsheet data found. [name] 06/25/2017, 2:53 PM               Labs:  CBC:  Recent Labs  06/24/17 0410 06/26/17 0228 06/27/17 0445 06/28/17 0510  WBC 7.1 13.0* 10.0 9.1  HGB 8.9* 9.3* 8.1* 8.2*  HCT 27.0* 28.3* 24.9* 26.2*  PLT 78* 163 172 196    COAGS:  Recent Labs  06/20/17 0819 06/23/17 2335  INR 0.94 1.27  APTT 32 33    BMP:  Recent Labs  06/26/17 0228 06/27/17 0445 06/27/17 1725 06/28/17 0510  NA 141 142 140 142  K 3.6 3.1* 2.8* 3.5  CL 110 109 104 107  CO2 24 27 28 30   GLUCOSE 135* 125* 104* 142*  BUN 15 34* 28* 34*  CALCIUM 9.6 9.2 8.7* 9.1  CREATININE 0.66 0.61 0.55 0.63  GFRNONAA >60 >60 >60 >60  GFRAA >60 >60 >60 >60    LIVER FUNCTION TESTS:  Recent Labs  01/26/17 0855 06/20/17 0819 06/21/17 0349 06/24/17 0410  BILITOT 0.5 0.6   --   --   AST 23 26  --   --   ALT 20 23  --   --   ALKPHOS 84 72  --   --   PROT 6.7 6.7  --   --   ALBUMIN 4.2 3.9 3.0* 2.7*    Assessment and Plan: CVA s/p bilateral common carotid angiograms with endovascular revascularization of occluded L MCA achieving TICI 2b reperfusion. Patient was re-intubated 06/25/17 due to respiratory distress.  She has been tolerating SBT.  Possible extubation today.  Currently on IV lasix but with soft BP, on neo. CCM and Neuro managing.  Groin intact, soft.   IR available if needed.   Electronically Signed: Docia Barrier, PA 06/28/2017, 3:07 PM   I spent a total of 15 Minutes at the the patient's bedside AND on the patient's hospital floor or unit, greater than 50% of which was counseling/coordinating care for MCA CVA

## 2017-06-28 NOTE — Plan of Care (Signed)
Problem: Nutrition: Goal: Risk of aspiration will decrease Outcome: Progressing HOB> 30 degrees. coretrack in place

## 2017-06-28 NOTE — Progress Notes (Signed)
STROKE TEAM PROGRESS NOTE   SUBJECTIVE (INTERVAL HISTORY) No family is at bedside. Pt still intubated but awake alert, interactive with provider. Still has aphasia but move all extremities.     OBJECTIVE Temp:  [98.2 F (36.8 C)-99.2 F (37.3 C)] 98.5 F (36.9 C) (09/10 1200) Pulse Rate:  [48-137] 71 (09/10 1230) Cardiac Rhythm: Normal sinus rhythm;Atrial fibrillation (09/10 1200) Resp:  [12-24] 15 (09/10 1230) BP: (81-151)/(48-109) 84/59 (09/10 1230) SpO2:  [90 %-100 %] 96 % (09/10 1230) FiO2 (%):  [30 %] 30 % (09/10 1200) Weight:  [213 lb 13.5 oz (97 kg)] 213 lb 13.5 oz (97 kg) (09/10 0730)  CBC:   Recent Labs Lab 06/27/17 0445 06/28/17 0510  WBC 10.0 9.1  NEUTROABS 6.9 6.1  HGB 8.1* 8.2*  HCT 24.9* 26.2*  MCV 92.6 93.9  PLT 172 250    Basic Metabolic Panel:   Recent Labs Lab 06/27/17 1725 06/28/17 0510  NA 140 142  K 2.8* 3.5  CL 104 107  CO2 28 30  GLUCOSE 104* 142*  BUN 28* 34*  CREATININE 0.55 0.63  CALCIUM 8.7* 9.1  MG 2.1 1.8  PHOS 1.9* 1.6*    Lipid Panel:     Component Value Date/Time   CHOL 133 06/21/2017 0349   CHOL 152 01/26/2017 0855   TRIG 99 06/26/2017 0949   HDL 45 06/21/2017 0349   HDL 56 01/26/2017 0855   CHOLHDL 3.0 06/21/2017 0349   VLDL 24 06/21/2017 0349   LDLCALC 64 06/21/2017 0349   LDLCALC 78 01/26/2017 0855   HgbA1c:  Lab Results  Component Value Date   HGBA1C 5.4 06/21/2017   Urine Drug Screen:     Component Value Date/Time   LABOPIA NONE DETECTED 06/20/2017 2201   COCAINSCRNUR NONE DETECTED 06/20/2017 2201   LABBENZ POSITIVE (A) 06/20/2017 2201   AMPHETMU NONE DETECTED 06/20/2017 2201   THCU NONE DETECTED 06/20/2017 2201   LABBARB NONE DETECTED 06/20/2017 2201    Alcohol Level No results found for: [place] I have personally reviewed the radiological images below and agree with the radiology interpretations.  Ct Head Code Stroke Wo Contrast 06/20/2017 IMPRESSION:  1. No significant interval change.   2. Stable atrophy and white matter disease.  3. No definite hyperdense vessel.  4. Stable right periorbital hematoma.  5. ASPECTS is 10/10   06/20/2017 IMPRESSION:  1. Stable atrophy and white matter disease.  2. No acute intracranial abnormality or significant interval change.  3. Prominent right infraorbital and lateral soft tissue hematoma without underlying fracture or intraorbital extension.  4. ASPECTS is 10/10  06/20/2017 IMPRESSION:  1. Focal density posteriorly in the left sylvian fissure may reflect contrast associated with a more distal occlusion.  2. No acute hemorrhage.  3. No evidence for progressing infarct.  4. Stable atrophy and white matter disease.  5. Increasing size of right infraorbital hematoma.   06/21/2017 1. New left posterolateral temporal lobe acute infarction.  2. Stable infarction centered in left frontal centrum semiovale extending into left superior lentiform nucleus and insula in comparison with prior CT/MRI head. Interval increase in edema and local mass effect associated with the infarct.  3. No acute intracranial hemorrhage.   Ct Maxillofacial Wo Contrast 06/20/2017 IMPRESSION:  1. No maxillofacial fracture.  2. Right infraorbital and periorbital soft tissue hematoma.   [name] Head Wo Contrast 06/20/2017 IMPRESSION:  1. High-grade stenosis or occlusion of the distal left M1 segment with nonvisualization of distal MCA branch vessels. This corresponds  with the patient's acute symptoms, likely representing an emergent large vessel occlusion.  2. Narrowing of the right M1 segment and attenuation of MCA branch vessels likely related to atherosclerotic disease.  3. Posterior circulation is intact.  4. Ill-defined area of restricted diffusion within white matter of the left centrum semi ovale may reflect acute ischemia. No definite cortical infarct is present.  Mr Brain Wo Contrast 06/21/2017 IMPRESSION:  1. Increased size of acute/subacute left MCA  territory infarct, now involving the left insular cortex, left frontal operculum, posterior left temporal and parietal lobe, and more anterior superior left frontal lobe.  2. No evidence for hemorrhagic conversion of left-sided infarcts.  3. Focal acute nonhemorrhagic infarct in the right parietal lobe and centrum semiovale. Bilateral disease raises concern for a central cardio embolic source.  4. Right periorbital hematoma.   TTE 06/20/2017  Left ventricle: The cavity size was normal. Wall thickness was   normal. Systolic function was mildly reduced. The estimated   ejection fraction was in the range of 45% to 50%. There is   akinesis of the apical myocardium. Doppler parameters are   consistent with abnormal left ventricular relaxation (grade 1   diastolic dysfunction).   LE arterial doppler - Pseudoaneurysm of the right groin measuring 4.68 x 4.11 cm with surrounding hematoma. Pseudo neck measures 0.58 cm and is 1.36 cm long.  UE venous doppler - There is no DVT or SVT noted in the bilateral upper extremities.     PHYSICAL EXAM Vitals:   06/28/17 1130 06/28/17 1147 06/28/17 1200 06/28/17 1230  BP: 114/71  (!) 91/48 (!) 84/59  Pulse: 77  76 71  Resp: 16  14 15   Temp:   98.5 F (36.9 C)   TempSrc:   Axillary   SpO2: 95% 96% 95% 96%  Weight:      Height:        Temp:  [98.2 F (36.8 C)-99.2 F (37.3 C)] 98.5 F (36.9 C) (09/10 1200) Pulse Rate:  [48-137] 71 (09/10 1230) Resp:  [12-24] 15 (09/10 1230) BP: (81-151)/(48-109) 84/59 (09/10 1230) SpO2:  [90 %-100 %] 96 % (09/10 1230) FiO2 (%):  [30 %] 30 % (09/10 1200) Weight:  [213 lb 13.5 oz (97 kg)] 213 lb 13.5 oz (97 kg) (09/10 0730)  General - Well nourished, well developed, intubated, on sedation  Ophthalmologic - Fundi not visualized due to noncooperation.  Cardiovascular - Regular rate and rhythm.  Neuro - awake, alert, eyes open, interactive, try to mouth with tube. However, not following commands, consistent  with aphasia, but able to pantomime. Eyes attending to both sides, but incomplete bilateral horizontal eye movement. PERRL, positive corneal and gag, blinking to visual threat bilaterally. Facial symmetry not able to test due to ET tube. RUE 4+/5 but no drift, left UE 5/5. RLE 3+/5 and LLE 4+/5. DTR 1+ and no babinski. Sensation, coordination and gait not tested.   ASSESSMENT/PLAN Ms. Melinda Noble is a 83 y.o. female with history of history of PAF not on AC, HTN, CAD/MI s/p stent, CHF, HLD who presents with right sided weakness. She is status post IV tPA 0941 on 06/20/2017 and IR mechanical thrombectomy.  Stroke: acute/subacute L MCA infarct, embolic, likely due to afib not on AC, s/p tPA and mechanical thrombectomy with TICI 2b reperfusion  Resultant intubated  CT head: no acute stroke.  Prominent right infraorbital and lateral soft tissue hematoma without underlying fracture or intraorbital extension  MRI head: acute/subacute L MCA infarct, involving the left  insular cortex, left frontal operculum, posterior left temporal and parietal lobe, and more anterior superior left frontal lobe.  Focal acute nonhemorrhagic infarct in the right parietal lobe and centrum semiovale.  MRA head: High-grade L M1 stenosis.  Ill-defined area of restricted diffusion within white matter of the left centrum semi ovale may reflect acute ischemia  IR / DSA - left M1 mechanical thrombectomy with TICI2b reperfusion  TTE EF 45% to 50%  UE venous doppler - no DVT  LDL 64  HgbA1c 5.4  SCDs for VTE prophylaxis Diet NPO time specified  aspirin 81 mg daily prior to admission, now on aspirin 81mg  and plavix 75mg  daily. Consider AC in 7-10 days if medical condition stabilized with no procedure planned  Patient counseled to be compliant with her antithrombotic medications  Ongoing aggressive stroke risk factor management  Therapy recommendations:   pending  Disposition:  Pending  PAF not on AC  AF pre-MI but  not sure about PAF after MI but on amiodarone   Follows with Dr. Tamala Julian in cardiology  On ASA now  Consider AC in 7-10 days if medical condition stabilized with no procedure planned.  Respiratory failure  Intubated - extubated - re-intubated  CXR - pulmonary edema and cardiomegaly  Continue diuresis  CCM on board  Expect extubation in am  Right femoral pseudoaneurysm  S/p emergency repair 06/23/17  VVS on board, appreciate help  D/c right groin JP drain today as per VVS  Hypotension  BP still low Still on Neo, d/c losartan  CVP monitoring  Long-term BP goal normotensive  Anemia, acute blood loss  Hb 9.4->6.1->7.8->8.9->9.3->8.1->8.2  Close CBC monitoring  Continue ASA and plavix  Hyperlipidemia  Home meds: Rosuvastatin 5 mg daily  LDL 64, goal < 70  On crestor 10mg  now  Continue statin at discharge  Other Stroke Risk Factors  Advanced age  Obesity, Body mass index is 32.52 kg/m., recommend weight loss, diet and exercise as appropriate   CAD/MI s/p stent - on ASA now  Other Active Problems  Leukocytosis 7.1 - 13.0 - 9.1    Hospital day # 8  This patient is critically ill and at significant risk of neurological worsening, death and care requires constant monitoring of vital signs, hemodynamics,respiratory and cardiac monitoring, extensive review of multiple databases, frequent neurological assessment, discussion with family, other specialists and medical decision making of high complexity.I have made any additions or clarifications directly to the above note.This critical care time does not reflect procedure time, or teaching time or supervisory time of PA/NP/Med Resident etc but could involve care discussion time. I spent 40 minutes of neurocritical care time  in the care of  this patient.  Rosalin Hawking, MD PhD Stroke Neurology 06/28/2017 2:20 PM   To contact Stroke Continuity provider, please refer to [email or web address] After hours, contact General  Neurology

## 2017-06-28 NOTE — Clinical Social Work Note (Signed)
Clinical Social Worker continuing to follow patient and family for support and discharge planning needs.  Patient is from [facility] independent living with her spouse and plans to return to [facility] at discharge.  Patient currently on the ventilator with plans to extubate today Noble tomorrow.  CSW remains available for support and to facilitate patient discharge needs once medically stable.  Melinda Noble, [place]

## 2017-06-28 NOTE — Progress Notes (Signed)
PULMONARY / CRITICAL CARE MEDICINE   Name: PAYSLIE MCCAIG MRN: [id number] DOB: [birth year]    ADMISSION DATE:  06/20/2017 CONSULTATION DATE:  9/2  REFERRING FU:[id number]   CHIEF COMPLAINT:   Vent management post CVA critical care   HISTORY OF PRESENT ILLNESS:   This is a 83 year old female w/ sig h/o atrial fib (not on systemic A/C). Presented to the ER on 9/2 w/ new (R) sided hemiplegia (she was independent w/ ADLs prior to this).   ER course: MRA ->felt trickle flow to left MCA but no areas of infarct.  -received TPA in the ER-->clinically actually declined.  -repeat CT head-->negative for ICH -went to IR: for cerebral arteriogram and endo vascularization.  -returned to the ICU on vent. PCCM asked to a/w care   SUBJECTIVE:  Respiratory status improved with diuresis.  Weaning PS 5/5, looks good.  Awake.  Husband at bedside.  Remains on low dose neo. CVP 15  VITAL SIGNS: BP (!) 89/50   Pulse 80   Temp 99.2 F (37.3 C) (Axillary)   Resp (!) 22   Ht 5\' 8"  (1.727 m)   Wt 97 kg (213 lb 13.5 oz)   SpO2 98%   BMI 32.52 kg/m    INTAKE / OUTPUT: I/O last 3 completed shifts: In: 5559.9 [I.V.:3397.2; NG/GT:1762.7; IV Piggyback:400] Out: 7910 [Urine:7850; Drains:60]  PHYSICAL EXAMINATION: General: rass 0, bruised rt face Neuro: awake, alert, follows commands, some confusion, pulling at tubes  HEENT: perr, ett PULM: resps even non labored on PS 5/5, coarse CV:  s1 s2 RRR GI: soft, BS wnl no  Extremities: scant BLE edema   LABS:  BMET  Recent Labs Lab 06/27/17 0445 06/27/17 1725 06/28/17 0510  NA 142 140 142  K 3.1* 2.8* 3.5  CL 109 104 107  CO2 27 28 30   BUN 34* 28* 34*  CREATININE 0.61 0.55 0.63  GLUCOSE 125* 104* 142*   Electrolytes  Recent Labs Lab 06/27/17 0445 06/27/17 1725 06/28/17 0510  CALCIUM 9.2 8.7* 9.1  MG 1.7 2.1 1.8  PHOS 1.1* 1.9* 1.6*   CBC  Recent Labs Lab 06/26/17 0228 06/27/17 0445 06/28/17 0510  WBC 13.0* 10.0 9.1  HGB  9.3* 8.1* 8.2*  HCT 28.3* 24.9* 26.2*  PLT 163 172 196   Coag's  Recent Labs Lab 06/23/17 2335  APTT 33  INR 1.27   Sepsis Markers No results for input(s): LATICACIDVEN, PROCALCITON, O2SATVEN in the last 168 hours.  ABG  Recent Labs Lab 06/24/17 0015 06/26/17 0328 06/26/17 1011  PHART 7.313* 7.249* 7.351  PCO2ART 41.0 57.6* 43.8  PO2ART 120.0* 146* 308.0*   Liver Enzymes  Recent Labs Lab 06/24/17 0410  ALBUMIN 2.7*   Cardiac Enzymes  Recent Labs Lab 06/26/17 0949 06/26/17 1803  TROPONINI 1.19* 1.61*    Glucose  Recent Labs Lab 06/27/17 0433 06/27/17 0718 06/27/17 1959 06/28/17 0005 06/28/17 0352 06/28/17 0815  GLUCAP 116* 116* 147* 137* 141* 145*   Imaging  STUDIES:  CT head 9/2: 1. Focal density posteriorly in the left sylvian fissure may reflect contrast associated with a more distal occlusion. 2. No acute hemorrhage. 3. No evidence for progressing infarct. 4. Stable atrophy and white matter disease. 5. Increasing size of right infraorbital hematoma. MCA 9/2:  High grade stenosis or occlusion of the distal M1 seg w/ nonvisualization of the distal MCA branch   CXR 9/2: ET tube in good position, mild venous congestion. No infiltrate.  CULTURES: Sputum 9/8>>>few GNR, rare GPC>>>  ANTIBIOTICS: 9/8 levoloxacin>>>  SIGNIFICANT EVENTS: 9/5>> OR for repair of R femoral pseudoaneurysm 9/7 extubated 9/8- pulm edema? Asp? NIMV 9/9 pos balance  LINES/TUBES: ETT 9/2>>>9/4, 9/5>>>9/6  DISCUSSION: Acute left MCA stroke w/ progression in spite of TPA. Now s/p IR for endovascularization. CCM on board for vent management. Extubated 9/6  ASSESSMENT / PLAN:  PULMONARY A: Need for mechanical ventilation for altered mental status.  Reintubated for OR 9/5  abg reviewed PCXR still pending Successfully extubated 9/6, protecting airway 9/8 recurrent resp failure, favor pulm edema, r/o asp P:   Continue diuresis as able for neg balance  Wean as  tol  Likely nearing extubation - plan is for NO reintubation  Husband wanting to wait until am for extubation - daughter at work 1 hour away and would want to be here given plans for no reintubation  F/u CXR  abx as above    CARDIOVASCULAR A:  Mild HTN  H/o afib Converted to rate controlled a-fib 9/7 early am Stress ischemia P:  Continue neo gtt - wean as able  Lasix as Scr tol for goal neg balance  Asa  Watch BP with lasix, may need to scale back    RENAL A:   Pulm edema Hypokalemia hypomag hypophos P:   k-phos  Continue lasix as above  F/u chem    GASTROINTESTINAL A:   Inadequate oral intake due to mental status - resolved.  P:   Feeding ppi  HEMATOLOGIC A:   No acute w/out evidence of bleeding Thrombocytopenia>> ? Consumptive from hematoma formation - improved Bloody drainage from JP ( 70 cc last pm)  P:  Trend cbc in am  scd as able  INFECTIOUS A:   At risk aspiration? 9/8 P:   Sputum follow Levofloxacin with allergies  ENDOCRINE A:   Hypothyroidism Hypoglycemic overnight 9/7 P:  CBG's Q 4  IV Synthroid  NEUROLOGIC A:   Left MCA stroke w/ right sided hemiplegia. Now s/p TPA and revascularization in IR P:   Supportive care  Per neuro  Low dose fentanyl gtt    FAMILY  - Updates:  Husband updated at length at bedside 9/10.    [name], NP 06/28/2017  9:32 AM Pager: [phone] or [phone]

## 2017-06-28 NOTE — Progress Notes (Signed)
Physical Therapy Treatment Patient Details Name: Melinda Noble MRN: [id number] DOB: 1930-01-21 Today's Date: 06/28/2017    History of Present Illness 83 y.o. female with a history of afib, not on anti-coagulants who presents with right sided weakness.Her husband states that he spoke with her while he was getting out of bed and she was normal, she then was up getting ready when he heard her fall in the bathroom, and found her on the floor with a large R periorbital hematoma, right-sided weakness, and slurred speech.  Initial NIHSS 8.  Administered tPA bolus at 0941 on 06/20/2017. taken emergently to IR and intubated and achieved a reperfusion. Extubated 06/22/17.     PT Comments    Pt progressing slowly towards physical therapy goals. Was able to sit EOB this session with increased independence, however cognition was limiting progression to OOB. By end of session pt seemingly ignoring therapists and although would follow commands to get her positioned comfortably in the bed, pt refused to acknowledge therapists or open eyes to speak with therapists. Will continue to follow and progress as able per POC.   Follow Up Recommendations  SNF     Equipment Recommendations  Other (comment) (TBA)    Recommendations for Other Services       Precautions / Restrictions Precautions Precautions: Fall Precaution Comments: R weakness/inattention Restrictions Weight Bearing Restrictions: No    Mobility  Bed Mobility Overal bed mobility: Needs Assistance Bed Mobility: Rolling;Sidelying to Sit;Supine to Sit;Sit to Supine Rolling: Mod assist;+2 for safety/equipment Sidelying to sit: +2 for physical assistance;Mod assist Supine to sit: Mod assist Sit to supine: Mod assist;+2 for safety/equipment   General bed mobility comments: Assist for LE movement, and trunk elevation to full sitting position. Pt asking "what are we doing?" several times throughout mobility.   Transfers                  General transfer comment: We were not able to progress to OOB 2 pt frustration and refusal to follow commands once at EOB.   Ambulation/Gait                 Stairs            Wheelchair Mobility    Modified Rankin (Stroke Patients Only) Modified Rankin (Stroke Patients Only) Pre-Morbid Rankin Score: No significant disability Modified Rankin: Severe disability     Balance Overall balance assessment: Needs assistance   Sitting balance-Leahy Scale: Poor Sitting balance - Comments: tendency to lean back esp as performing LE therex for LAQ despite cues to work on balance instead Postural control: Posterior lean                                  Cognition Arousal/Alertness: Awake/alert Behavior During Therapy: Restless Overall Cognitive Status: Impaired/Different from baseline Area of Impairment: Orientation;Attention;Following commands;Problem solving;Safety/judgement                 Orientation Level: Disoriented to;Situation Current Attention Level: Focused   Following Commands: Follows one step commands inconsistently;Follows one step commands with increased time Safety/Judgement: Decreased awareness of safety;Decreased awareness of deficits Awareness: Intellectual Problem Solving: Slow processing;Decreased initiation;Requires verbal cues;Requires tactile cues;Difficulty sequencing General Comments: also limited by expressive aphasia. pt appeared to get frustrated throughout session, with decreased following commands at end of session. Pt appeared to be ignoring therapists and refused to open eyes, however would follow commands while organizing lines and positioning  for comfort.       Exercises      General Comments        Pertinent Vitals/Pain Pain Assessment: Faces Faces Pain Scale: No hurt    Home Living                      Prior Function            PT Goals (current goals can now be found in the care plan  section) Acute Rehab PT Goals Patient Stated Goal: per family to get therapy PT Goal Formulation: With patient/family Time For Goal Achievement: 07/06/17 Potential to Achieve Goals: Fair Progress towards PT goals: Progressing toward goals    Frequency    Min 3X/week      PT Plan Current plan remains appropriate    Co-evaluation              AM-PAC PT "6 Clicks" Daily Activity  Outcome Measure  Difficulty turning over in bed (including adjusting bedclothes, sheets and blankets)?: Unable Difficulty moving from lying on back to sitting on the side of the bed? : Unable Difficulty sitting down on and standing up from a chair with arms (e.g., wheelchair, bedside commode, etc,.)?: Unable Help needed moving to and from a bed to chair (including a wheelchair)?: A Lot Help needed walking in hospital room?: Total Help needed climbing 3-5 steps with a railing? : Total 6 Click Score: 7    End of Session Equipment Utilized During Treatment: Oxygen Activity Tolerance: Patient limited by fatigue Patient left: in bed;with call bell/phone within reach;with SCD's reapplied Nurse Communication: Mobility status;Need for lift equipment PT Visit Diagnosis: Other symptoms and signs involving the nervous system (R29.898);Other abnormalities of gait and mobility (R26.89);Hemiplegia and hemiparesis Hemiplegia - Right/Left: Right Hemiplegia - dominant/non-dominant: Non-dominant Hemiplegia - caused by: Cerebral infarction     Time: 1102-1130 PT Time Calculation (min) (ACUTE ONLY): 28 min  Charges:  $Therapeutic Activity: 23-37 mins                    G Codes:       Rolinda Roan, PT, DPT Acute Rehabilitation Services Pager: [phone]    Thelma Comp 06/28/2017, 1:12 PM

## 2017-06-29 ENCOUNTER — Inpatient Hospital Stay (HOSPITAL_COMMUNITY): Payer: Medicare Other

## 2017-06-29 LAB — GLUCOSE, CAPILLARY
GLUCOSE-CAPILLARY: 143 mg/dL — AB (ref 65–99)
GLUCOSE-CAPILLARY: 106 mg/dL — AB (ref 65–99)
Glucose-Capillary: 114 mg/dL — ABNORMAL HIGH (ref 65–99)
Glucose-Capillary: 119 mg/dL — ABNORMAL HIGH (ref 65–99)
Glucose-Capillary: 139 mg/dL — ABNORMAL HIGH (ref 65–99)
Glucose-Capillary: 142 mg/dL — ABNORMAL HIGH (ref 65–99)

## 2017-06-29 LAB — CBC
HCT: 26.3 % — ABNORMAL LOW (ref 36.0–46.0)
HEMOGLOBIN: 8.1 g/dL — AB (ref 12.0–15.0)
MCH: 29.5 pg (ref 26.0–34.0)
MCHC: 30.8 g/dL (ref 30.0–36.0)
MCV: 95.6 fL (ref 78.0–100.0)
Platelets: 201 10*3/uL (ref 150–400)
RBC: 2.75 MIL/uL — AB (ref 3.87–5.11)
RDW: 16 % — ABNORMAL HIGH (ref 11.5–15.5)
WBC: 9.2 10*3/uL (ref 4.0–10.5)

## 2017-06-29 LAB — BASIC METABOLIC PANEL
ANION GAP: 4 — AB (ref 5–15)
BUN: 33 mg/dL — ABNORMAL HIGH (ref 6–20)
CHLORIDE: 107 mmol/L (ref 101–111)
CO2: 29 mmol/L (ref 22–32)
Calcium: 9.3 mg/dL (ref 8.9–10.3)
Creatinine, Ser: 0.62 mg/dL (ref 0.44–1.00)
GFR calc non Af Amer: >60 mL/min (ref >60)
Glucose, Bld: 139 mg/dL — ABNORMAL HIGH (ref 65–99)
POTASSIUM: 3.5 mmol/L (ref 3.5–5.1)
SODIUM: 140 mmol/L (ref 135–145)

## 2017-06-29 LAB — PHOSPHORUS: PHOSPHORUS: 2.1 mg/dL — AB (ref 2.5–4.6)

## 2017-06-29 MED ORDER — SODIUM CHLORIDE 0.9% FLUSH
10.0000 mL | INTRAVENOUS | Status: DC | PRN
  Administered 2017-07-01: 20 mL
  Filled 2017-06-29: qty 40

## 2017-06-29 MED ORDER — MIDAZOLAM HCL 2 MG/2ML IJ SOLN
0.2500 mg | INTRAMUSCULAR | Status: DC | PRN
  Administered 2017-06-29: 0.25 mg via INTRAVENOUS
  Filled 2017-06-29: qty 2

## 2017-06-29 MED ORDER — CHLORHEXIDINE GLUCONATE CLOTH 2 % EX PADS
6.0000 | MEDICATED_PAD | Freq: Every day | CUTANEOUS | Status: DC
  Administered 2017-06-29 – 2017-07-02 (×4): 6 via TOPICAL

## 2017-06-29 MED ORDER — SERTRALINE HCL 100 MG PO TABS
100.0000 mg | ORAL_TABLET | Freq: Every day | ORAL | Status: DC
  Administered 2017-06-29 – 2017-07-03 (×4): 100 mg
  Filled 2017-06-29 (×3): qty 1
  Filled 2017-06-29: qty 2

## 2017-06-29 MED ORDER — CALCIUM CARBONATE-VITAMIN D 500-200 MG-UNIT PO TABS
1.0000 | ORAL_TABLET | Freq: Every day | ORAL | Status: DC
  Administered 2017-07-01 – 2017-07-02 (×2): 1
  Filled 2017-06-29 (×5): qty 1

## 2017-06-29 MED ORDER — SODIUM CHLORIDE 0.9% FLUSH
10.0000 mL | Freq: Two times a day (BID) | INTRAVENOUS | Status: DC
  Administered 2017-06-29 – 2017-07-01 (×6): 10 mL

## 2017-06-29 MED ORDER — ROSUVASTATIN CALCIUM 5 MG PO TABS
10.0000 mg | ORAL_TABLET | ORAL | Status: DC
  Administered 2017-07-02: 10 mg
  Filled 2017-06-29: qty 1

## 2017-06-29 MED ORDER — FUROSEMIDE 10 MG/ML IJ SOLN
80.0000 mg | Freq: Two times a day (BID) | INTRAMUSCULAR | Status: DC
  Administered 2017-06-29 (×2): 80 mg via INTRAVENOUS
  Filled 2017-06-29 (×2): qty 8

## 2017-06-29 MED ORDER — CHLORHEXIDINE GLUCONATE 0.12 % MT SOLN
15.0000 mL | Freq: Two times a day (BID) | OROMUCOSAL | Status: DC
  Administered 2017-06-30 – 2017-07-02 (×5): 15 mL via OROMUCOSAL
  Filled 2017-06-29 (×4): qty 15

## 2017-06-29 MED ORDER — ASPIRIN 81 MG PO CHEW
81.0000 mg | CHEWABLE_TABLET | Freq: Every day | ORAL | Status: DC
  Administered 2017-07-01 – 2017-07-02 (×2): 81 mg
  Filled 2017-06-29 (×3): qty 1

## 2017-06-29 MED ORDER — ADULT MULTIVITAMIN W/MINERALS CH
1.0000 | ORAL_TABLET | Freq: Every day | ORAL | Status: DC
  Administered 2017-06-29 – 2017-07-02 (×3): 1
  Filled 2017-06-29 (×4): qty 1

## 2017-06-29 MED ORDER — MONTELUKAST SODIUM 10 MG PO TABS
10.0000 mg | ORAL_TABLET | Freq: Every day | ORAL | Status: DC
  Administered 2017-06-30 – 2017-07-02 (×3): 10 mg
  Filled 2017-06-29 (×6): qty 1

## 2017-06-29 MED ORDER — CLOPIDOGREL BISULFATE 75 MG PO TABS
75.0000 mg | ORAL_TABLET | Freq: Every day | ORAL | Status: DC
  Administered 2017-07-01 – 2017-07-02 (×2): 75 mg
  Filled 2017-06-29 (×3): qty 1

## 2017-06-29 MED ORDER — ORAL CARE MOUTH RINSE
15.0000 mL | Freq: Two times a day (BID) | OROMUCOSAL | Status: DC
  Administered 2017-06-30 – 2017-07-02 (×5): 15 mL via OROMUCOSAL

## 2017-06-29 MED ORDER — POTASSIUM PHOSPHATES 15 MMOLE/5ML IV SOLN
10.0000 mmol | Freq: Once | INTRAVENOUS | Status: AC
  Administered 2017-06-29: 10 mmol via INTRAVENOUS
  Filled 2017-06-29: qty 3.33

## 2017-06-29 MED ORDER — VITAMIN E 180 MG (400 UNIT) PO CAPS
400.0000 [IU] | ORAL_CAPSULE | Freq: Every day | ORAL | Status: DC
  Administered 2017-07-01 – 2017-07-02 (×2): 400 [IU]
  Filled 2017-06-29 (×5): qty 1

## 2017-06-29 MED ORDER — DILTIAZEM HCL 25 MG/5ML IV SOLN
10.0000 mg | Freq: Once | INTRAVENOUS | Status: AC
  Administered 2017-06-29: 10 mg via INTRAVENOUS
  Filled 2017-06-29 (×3): qty 5

## 2017-06-29 NOTE — Progress Notes (Signed)
PULMONARY / CRITICAL CARE MEDICINE   Name: RANI IDLER MRN: [id number] DOB: 07-24-1930    ADMISSION DATE:  06/20/2017 CONSULTATION DATE:  9/2  REFERRING KD:[id number]   CHIEF COMPLAINT:   Vent management post CVA critical care   HISTORY OF PRESENT ILLNESS:   This is a 83 year old female w/ sig h/o atrial fib (not on systemic A/C). Presented to the ER on 9/2 w/ new (R) sided hemiplegia (she was independent w/ ADLs prior to this).   ER course: MRA ->felt trickle flow to left MCA but no areas of infarct.  -received TPA in the ER-->clinically actually declined.  -repeat CT head-->negative for ICH -went to IR: for cerebral arteriogram and endo vascularization.  -returned to the ICU on vent. PCCM asked to a/w care   SUBJECTIVE:  Weaned 3 hours on 5/5  VITAL SIGNS: BP 136/70   Pulse 74   Temp 98 F (36.7 C) (Axillary)   Resp 20   Ht 5\' 8"  (1.727 m)   Wt 97 kg (213 lb 13.5 oz)   SpO2 99%   BMI 32.52 kg/m    INTAKE / OUTPUT: I/O last 3 completed shifts: In: 9833 [I.V.:2830; NG/GT:1440; IV Piggyback:960] Out: 8250 [Urine:3850]  PHYSICAL EXAMINATION: General: awake, alert Neuro: fc, moves all ext HEENT: ett, jvd present PULM: coarse  CV: s1 s2 RRR no r GI: soft, obese, no reb, no g Extremities: edema   LABS:  BMET  Recent Labs Lab 06/27/17 1725 06/28/17 0510 06/29/17 0431  NA 140 142 140  K 2.8* 3.5 3.5  CL 104 107 107  CO2 28 30 29   BUN 28* 34* 33*  CREATININE 0.55 0.63 0.62  GLUCOSE 104* 142* 139*   Electrolytes  Recent Labs Lab 06/27/17 0445 06/27/17 1725 06/28/17 0510 06/28/17 1745 06/29/17 0431  CALCIUM 9.2 8.7* 9.1  --  9.3  MG 1.7 2.1 1.8  --   --   PHOS 1.1* 1.9* 1.6* 3.5 2.1*   CBC  Recent Labs Lab 06/27/17 0445 06/28/17 0510 06/29/17 0431  WBC 10.0 9.1 9.2  HGB 8.1* 8.2* 8.1*  HCT 24.9* 26.2* 26.3*  PLT 172 196 201   Coag's  Recent Labs Lab 06/23/17 2335  APTT 33  INR 1.27   Sepsis Markers No results for input(s):  LATICACIDVEN, PROCALCITON, O2SATVEN in the last 168 hours.  ABG  Recent Labs Lab 06/24/17 0015 06/26/17 0328 06/26/17 1011  PHART 7.313* 7.249* 7.351  PCO2ART 41.0 57.6* 43.8  PO2ART 120.0* 146* 308.0*   Liver Enzymes  Recent Labs Lab 06/24/17 0410  ALBUMIN 2.7*   Cardiac Enzymes  Recent Labs Lab 06/26/17 0949 06/26/17 1803  TROPONINI 1.19* 1.61*    Glucose  Recent Labs Lab 06/28/17 1204 06/28/17 1601 06/28/17 1944 06/28/17 2333 06/29/17 0338 06/29/17 0759  GLUCAP 150* 143* 130* 143* 139* 143*   Imaging  STUDIES:  CT head 9/2: 1. Focal density posteriorly in the left sylvian fissure may reflect contrast associated with a more distal occlusion. 2. No acute hemorrhage. 3. No evidence for progressing infarct. 4. Stable atrophy and white matter disease. 5. Increasing size of right infraorbital hematoma. MCA 9/2:  High grade stenosis or occlusion of the distal M1 seg w/ nonvisualization of the distal MCA branch   CXR 9/2: ET tube in good position, mild venous congestion. No infiltrate.  CULTURES: Sputum 9/8>>>few GNR, rare GPC>>>nF  ANTIBIOTICS: 9/8 levoloxacin>>>  SIGNIFICANT EVENTS: 9/5>> OR for repair of R femoral pseudoaneurysm 9/7 extubated 9/8- pulm edema? Asp? NIMV  9/9 pos balance  LINES/TUBES: ETT 9/2>>>9/4, 9/5>>>9/6  DISCUSSION: Acute left MCA stroke w/ progression in spite of TPA. Now s/p IR for endovascularization. CCM on board for vent management. Extubated 9/6  ASSESSMENT / PLAN:  PULMONARY A: Need for mechanical ventilation for altered mental status.  Reintubated for OR 9/5  abg reviewed PCXR still pending Successfully extubated 9/6, protecting airway 9/8 recurrent resp failure, favor pulm edema, r/o asp P:   Weaning 5/5 again, as she did yesterday for 3 succesful hours without extubation She was neg 3.7 liters then pos 1.3 today cvp accuracy? pcxr is improving We have max success for extubation Ensure do not reintubation  with planned extubation Neg balance as renal fxn improves  CARDIOVASCULAR A:  Mild HTN  H/o afib Converted to rate controlled a-fib 9/7 early am Stress ischemia P:  Lasix back to prior dosing, had great response Tele Follow rate cvp dc, accuracy?, cvp 15 this am  Neo to MAP 60  RENAL A:   Pulm edema Hypokalemia hypomag hypophos P:   k-phos  Lasix re add Chem in am kvo  GASTROINTESTINAL A:   Inadequate oral intake due to mental status - resolved.  P:   Feeding hold ppi  HEMATOLOGIC A:   No acute w/out evidence of bleeding Thrombocytopenia>> ? Consumptive from hematoma formation - improved Bloody drainage from JP ( 70 cc last pm)  P:  Trend cbc in am  scd to left  INFECTIOUS A:   At risk aspiration? 9/8 P:   Sputum nf Levofloxacin with allergies - dc as no growth, clincally NOT c;w asp, pcxr responded to lasix  ENDOCRINE A:   Hypothyroidism Hypoglycemic overnight 9/7 P:  CBG's Q 4  IV Synthroid  NEUROLOGIC A:   Left MCA stroke w/ right sided hemiplegia. Now s/p TPA and revascularization in IR P:   Supportive care  Per neuro  Low dose fentanyl gtt  - WUA,. dc    FAMILY  - Updates: by me this am update  Ccm time 30 min   Lavon Paganini. Titus Mould, MD, [place] Pgr: [facility]

## 2017-06-29 NOTE — Progress Notes (Signed)
Referring Physician(s): Dr Royal Hawthorn  Supervising Physician: Luanne Bras  Patient Status:  [facility] - In-pt  Chief Complaint:  CVA  Subjective:  L MCA revascularization 06/20/2017 Right groin pseudoaneurysm repair 9/4 Re intubated secondary respiratory distress Moving all 4s Possible extubation soon; swallow study Poss rehab soon  Allergies: Ace inhibitors; Beta adrenergic blockers; Iohexol; Atorvastatin; Latex; Penicillins; and Sulfa antibiotics  Medications: Prior to Admission medications   Medication Sig Start Date End Date Taking? Authorizing Provider  acetaminophen (TYLENOL) 325 MG tablet Take 650 mg by mouth every 6 (six) hours as needed for mild pain.   Yes [provider]  ALPRAZolam (XANAX) 0.25 MG tablet TAKE (1) TABLET TWICE DAILY AS NEEDED FOR ANXIETY. Patient taking differently: TAKE HALF TABLET (0.125mg ) TWICE DAILY AS NEEDED FOR ANXIETY. 04/19/17  Yes Reed, Tiffany L, DO  aspirin 81 MG chewable tablet Chew 1 tablet (81 mg total) by mouth daily. 10/17/13  Yes Lyda Jester M, PA-C  Biotin 1000 MCG tablet Take 1,000 mcg by mouth daily.   Yes [provider]  buPROPion (WELLBUTRIN SR) 150 MG 12 hr tablet Take 150 mg by mouth every morning.  08/22/16  Yes [provider]  calcium-vitamin D (OSCAL WITH D) 500-200 MG-UNIT per tablet Take 1 tablet by mouth daily.    Yes [provider]  carvedilol (COREG) 6.25 MG tablet Take 1 tablet (6.25 mg total) by mouth 2 (two) times daily with a meal. 04/09/14  Yes Leonie Man, MD  cholecalciferol (VITAMIN D) 1000 UNITS tablet Take 1,000 Units by mouth daily.   Yes [provider]  clobetasol ointment (TEMOVATE) 1.85 % Apply 1 application topically 2 (two) times daily as needed. (affected areas)--also thighs 07/29/16  Yes Reed, Tiffany L, DO  clopidogrel (PLAVIX) 75 MG tablet Take 75 mg by mouth daily.   Yes [provider]  fexofenadine (ALLEGRA) 60 MG tablet Take 60  mg by mouth 2 (two) times daily as needed for allergies or rhinitis.   Yes [provider]  furosemide (LASIX) 20 MG tablet TAKE 1/2 TABLET DAILY. Patient taking differently: TAKE 1/2 TABLET (10mg ) DAILY. 05/03/17  Yes Reed, Tiffany L, DO  losartan (COZAAR) 100 MG tablet Take 1 tablet (100 mg total) by mouth daily. 12/30/16 06/21/17 Yes Belva Crome, MD  montelukast (SINGULAIR) 10 MG tablet Take 10 mg by mouth at bedtime.   Yes [provider]  Multiple Vitamin (MULTIVITAMIN WITH MINERALS) TABS tablet Take 1 tablet by mouth daily.   Yes [provider]  nitroGLYCERIN (NITROSTAT) 0.4 MG SL tablet Place 0.4 mg under the tongue every 5 (five) minutes as needed for chest pain.   Yes [provider]  omeprazole (PRILOSEC) 20 MG capsule Take 20 mg by mouth daily.    Yes [provider]  Polyethyl Glycol-Propyl Glycol (SYSTANE OP) Apply 1 drop to eye daily as needed (dry eyes).   Yes [provider]  rosuvastatin (CRESTOR) 5 MG tablet Take 10 mg by mouth 3 (three) times a week. (Mondays, Wednesdays, and Fridays)   Yes [provider]  sertraline (ZOLOFT) 100 MG tablet Take 100 mg by mouth daily.  07/06/16  Yes [provider]  SYNTHROID 100 MCG tablet TAKE 1 TABLET IN THE MORNING ON AN EMPTY STOMACH. Patient taking differently: TAKE 1 TABLET (124mcg) IN THE MORNING ON AN EMPTY STOMACH. 05/17/17  Yes Reed, Tiffany L, DO  vitamin E (VITAMIN E) 400 UNIT capsule Take 400 Units by mouth daily.  Yes [provider]     Vital Signs: BP 136/70   Pulse 74   Temp 98 F (36.7 C) (Axillary)   Resp 20   Ht 5\' 8"  (1.727 m)   Wt 213 lb 13.5 oz (97 kg)   SpO2 96%   BMI 32.52 kg/m   Physical Exam  Constitutional:  Intubated but alert Follows commands  Abdominal: Soft.  Musculoskeletal: Normal range of motion.  Moves all 4s to command God strength B  Neurological: She is alert.  Skin: Skin is warm.  Right groin ecchymotic NT  no bleeding Soft Rt foot 2+ pulses  Nursing note and vitals reviewed.   Imaging: Dg Chest Portable 1 View  Result Date: 06/29/2017 CLINICAL DATA:  Respiratory failure. EXAM: PORTABLE CHEST 1 VIEW COMPARISON:  06/28/2017. FINDINGS: Endotracheal tube, feeding tube, left subclavian line in stable position. Cardiomegaly with normal pulmonary vascularity. Low lung volumes with left base atelectasis/infiltrate. Small left pleural effusion. No pneumothorax . IMPRESSION: 1.  Lines and tubes in stable position. 2. Low lung volumes with left base atelectasis/infiltrate. Small left pleural effusion. Electronically Signed   By: Marcello Moores  Register   On: 06/29/2017 07:21   Dg Chest Port 1 View  Result Date: 06/28/2017 CLINICAL DATA:  Acute respiratory failure.  Ischemic stroke. EXAM: PORTABLE CHEST 1 VIEW COMPARISON:  Yesterday FINDINGS: Endotracheal tube tip at the clavicular heads. Left subclavian central line at the brachiocephalic SVC confluence. A feeding tube at least reaches the diaphragm. Haziness of the left more than right chest with left diaphragm obscured due to retrocardiac lung opacity. Cardiomegaly. Coronary stent present. No pneumothorax. IMPRESSION: 1. Stable positioning of tubes and central line. 2. Hazy opacity at the left more than right base favoring atelectasis and pleural fluid. Electronically Signed   By: Monte Fantasia M.D.   On: 06/28/2017 07:17   Dg Chest Port 1 View  Result Date: 06/27/2017 CLINICAL DATA:  Pulmonary edema. EXAM: PORTABLE CHEST 1 VIEW COMPARISON:  Radiograph of June 26, 2017. FINDINGS: Stable cardiomegaly. Endotracheal and feeding tubes are unchanged in position. Left subclavian catheter is unchanged in position. No pneumothorax is noted. Stable hazy opacity seen in right lung which may represent small pleural effusion. Mildly increased left basilar opacity is noted concerning for atelectasis or pneumonia with possible pleural effusion. IMPRESSION: Stable support  apparatus. Mildly increased left basilar opacity concerning for atelectasis or pneumonia with possible associated pleural effusion. Electronically Signed   By: Marijo Conception, M.D.   On: 06/27/2017 08:04   Dg Chest Port 1 View  Result Date: 06/26/2017 CLINICAL DATA:  Stroke, central line EXAM: PORTABLE CHEST 1 VIEW COMPARISON:  06/26/2017 FINDINGS: Endotracheal tube tip is about 3 cm superior to the carina. Placement of a left-sided central venous catheter with tip overlying the SVC origin. No left pneumothorax is seen. Esophageal tube extends below diaphragm but is not included on the image. Stable cardiomegaly. Slightly improved aeration of the lung bases. Mild right greater than left hazy pulmonary opacity persists. Suspect that there are small effusions. IMPRESSION: 1. Left-sided central venous catheter tip overlies the SVC origin. No left pneumothorax 2. Slightly improved aeration of the lung bases. Small pleural effusions and hazy right greater than left edema or infiltrates are otherwise unchanged Electronically Signed   By: Donavan Foil M.D.   On: 06/26/2017 19:11   Dg Chest Port 1 View  Result Date: 06/26/2017 CLINICAL DATA:  ETT placement  Hx of CAD, HTN EXAM: PORTABLE CHEST 1 VIEW COMPARISON:  Chest  x-ray from earlier same day. FINDINGS: Endotracheal tube is appropriately position with tip approximately 3 cm above the level of the carina. Enteric tube passes below the diaphragm. Heart size and mediastinal contours are stable in the short-term interval. Persistent pulmonary edema pattern, unchanged in the short-term interval. Probable small bilateral pleural effusions and/or atelectasis at each lung base. No pneumothorax seen. IMPRESSION: 1. Endotracheal tube is well positioned with tip just above the level of the carina. 2. No other change in the short-term interval. Persistent pulmonary edema pattern. Probable small bilateral pleural effusions and/or atelectasis. Electronically Signed   By: Franki Cabot M.D.   On: 06/26/2017 09:15   Dg Chest Port 1 View  Result Date: 06/26/2017 CLINICAL DATA:  Respiratory failure EXAM: PORTABLE CHEST 1 VIEW COMPARISON:  June 25, 2017 FINDINGS: No pneumothorax. Cardiomegaly is stable. The hila and mediastinum are unchanged. Pulmonary edema persists, slightly improved in the interval. Probable small bilateral effusions with underlying atelectasis. IMPRESSION: Cardiomegaly. Pulmonary edema persists, slightly improved in the interval. No other change. Electronically Signed   By: Dorise Bullion III M.D   On: 06/26/2017 07:03   Dg Chest Port 1 View  Result Date: 06/25/2017 CLINICAL DATA:  Shortness of breath tonight. EXAM: PORTABLE CHEST 1 VIEW COMPARISON:  Chest radiograph June 23, 2017 FINDINGS: The cardiac silhouette is moderately enlarged and unchanged. Mediastinal silhouette is nonsuspicious, accentuated by rotation to the RIGHT. Pulmonary vascular congestion and interstitial prominence with new LEFT lung base airspace opacity. Small pleural effusions. No pneumothorax. Feeding tube past proximal stomach, distal tip not included. Soft tissue planes and included osseous structures are unchanged. Interval extubation removal of temperature probe and RIGHT IJ catheter. IMPRESSION: Stable cardiomegaly. New pulmonary edema and LEFT lung base consolidation. Small pleural effusions. Interval extubation and removal of IJ catheter. Electronically Signed   By: Elon Alas M.D.   On: 06/25/2017 23:02    Labs:  CBC:  Recent Labs  06/26/17 0228 06/27/17 0445 06/28/17 0510 06/29/17 0431  WBC 13.0* 10.0 9.1 9.2  HGB 9.3* 8.1* 8.2* 8.1*  HCT 28.3* 24.9* 26.2* 26.3*  PLT 163 172 196 201    COAGS:  Recent Labs  06/20/17 0819 06/23/17 2335  INR 0.94 1.27  APTT 32 33    BMP:  Recent Labs  06/27/17 0445 06/27/17 1725 06/28/17 0510 06/29/17 0431  NA 142 140 142 140  K 3.1* 2.8* 3.5 3.5  CL 109 104 107 107  CO2 27 28 30 29   GLUCOSE 125*  104* 142* 139*  BUN 34* 28* 34* 33*  CALCIUM 9.2 8.7* 9.1 9.3  CREATININE 0.61 0.55 0.63 0.62  GFRNONAA >60 >60 >60 >60  GFRAA >60 >60 >60 >60    LIVER FUNCTION TESTS:  Recent Labs  01/26/17 0855 06/20/17 0819 06/21/17 0349 06/24/17 0410  BILITOT 0.5 0.6  --   --   AST 23 26  --   --   ALT 20 23  --   --   ALKPHOS 84 72  --   --   PROT 6.7 6.7  --   --   ALBUMIN 4.2 3.9 3.0* 2.7*    Assessment and Plan:  CVA 9/2---L MCA revascularization Rt groin pseudoaneurysm and repair 9/4 Plan per Neuro/stroke team  Electronically Signed: Cyenna Rebello A, PA-C 06/29/2017, 11:36 AM   I spent a total of 15 Minutes at the the patient's bedside AND on the patient's hospital floor or unit, greater than 50% of which was counseling/coordinating care for CVA/ revascularization

## 2017-06-29 NOTE — Progress Notes (Signed)
  Speech Language Pathology Treatment: Dysphagia  Patient Details Name: Melinda Noble MRN: [id number] DOB: 1930/04/21 Today's Date: 06/29/2017 Time: 1450-1500 SLP Time Calculation (min) (ACUTE ONLY): 10 min  Assessment / Plan / Recommendation Clinical Impression  Events of this weekend, including reintubation, meeting with Palliative medicine, noted.  New orders received for repeat swallow evaluation. Pt just extubated around 1:00.  Oral-motor exam reveals baseline lower right facial asymmetry, good tongue extension, improved imitation of oral-motor commands.  Pt is aphonic s/p extubation - unable to elicit voice.  She pulled cortrak today.  Discussed plan with daughter, Melinda Noble, and Dr. Erlinda Hong.  Will re-assess swallow at bedside tomorrow to determine readiness for repeat MBS.  Unfortunately, 9/7 study revealed aspiration of purees, nectars, and honey-thick liquids, and subsequent reintubation does not bode well for improved performance tomorrow.  If not ready tomorrow, will plan for repeat MBS later this week to assist with decision-making re: nutrition long-term.  Melinda Noble agrees with plan.    HPI HPI: 83 year old female w/ sig h/o atrial fib (not on systemic A/C). Presented to the ER on 9/2 w/ new (R) sided hemiplegia.  Dx  left MCA CVA, S/P endovascularization in IR.  ETT 9/2-9/4.   MRI: acute/subacute L MCA infarct, involving L insular cortex, L frontal operculum, L posterior temporal and parietal lobe, and more anterior L superior frontal lobe, as well as acute infarct R parietal lobe and Rcentrum semiovale, embolic, in the setting of atheorsclerosis. Pt subsequently with right groin hematoma; to OR 9/5 for repair femoral artery pseudoaneurysm; ETT 9/5-9/6.  Had MBS 9/7, which revealed aspiration of purees, honey, and nectar-thick liquids with non-protective, delayed cough.  Recs were made to continue NPO,  place cortrak.  Pt went into respiratory distress and was reintubated 9/8-9/11.      SLP Plan  Continue with current plan of care;Other (Comment) (repeat MBS when appropriate)       Recommendations  Diet recommendations: NPO                Oral Care Recommendations: Oral care QID Follow up Recommendations: Skilled Nursing facility SLP Visit Diagnosis: Dysphagia, oropharyngeal phase (R13.12) Plan: Continue with current plan of care;Other (Comment) (repeat MBS when appropriate)       GO                [name] 06/29/2017, 3:10 PM Dailyn Kempner L. Tivis Ringer, [place] CCC/SLP Pager [phone]

## 2017-06-29 NOTE — Progress Notes (Signed)
OT Treatment Note - late entry  Pt seen while intubated. Able to move to EOB with mod A to elevate trunk. Pt with increased movement R side overall. Pt tolerated well with VSS, however, appeared to become anxious once sitting EOB and pt initiating returning to supine. Question if pt became dizzy once sitting EOB. Will continue ot follow acutely to facilitate DC to SNF for rehab.   06/28/17 1700  OT Visit Information  Last OT Received On 06/28/17  Assistance Needed +2  History of Present Illness 83 y.o. female with a history of afib, not on anti-coagulants who presents with right sided weakness.Her husband states that he spoke with her while he was getting out of bed and she was normal, she then was up getting ready when he heard her fall in the bathroom, and found her on the floor with a large R periorbital hematoma, right-sided weakness, and slurred speech.  Initial NIHSS 8.  Administered tPA bolus at 0941 on 06/20/2017. taken emergently to IR and intubated and achieved a reperfusion. Extubated 06/22/17. Reintubated 9/8 due to ? pulmonary edema.   Precautions  Precautions Fall  Precaution Comments vent  Pain Assessment  Pain Assessment Faces  Faces Pain Scale 4  Pain Location general discomfot  Pain Descriptors / Indicators Grimacing;Guarding  Pain Intervention(s) Limited activity within patient's tolerance  Cognition  Arousal/Alertness Awake/alert  Behavior During Therapy Anxious  Overall Cognitive Status Difficult to assess  Following Commands Follows one step commands inconsistently  General Comments apparent motor planning deficits  Difficult to assess due to Intubated  ADL  Overall ADL's  Needs assistance/impaired  Functional mobility during ADLs Moderate assistance (with bed mobility)  Bed Mobility  Overal bed mobility Needs Assistance  Bed Mobility Supine to Sit;Sit to Supine  Supine to sit Mod assist  Sit to supine Mod assist  General bed mobility comments Pt initiating movement  to EOB. Assisted with elevation of trunk.   Balance  Sitting balance-Leahy Scale Poor  Vision- Assessment  Additional Comments will further assess  Exercises  Exercises Other exercises  Other Exercises  Other Exercises BUE general AROM through functional ROM x 5  OT - End of Session  Equipment Utilized During Treatment (vent)  Activity Tolerance Other (comment) (Became anxious once sitting EOB)  Patient left in bed;with call bell/phone within reach;with bed alarm set;with family/visitor present;with restraints reapplied  Nurse Communication Mobility status  OT Assessment/Plan  OT Plan Discharge plan remains appropriate  OT Visit Diagnosis Other abnormalities of gait and mobility (R26.89);Muscle weakness (generalized) (M62.81);Apraxia (R48.2);Other symptoms and signs involving cognitive function;Hemiplegia and hemiparesis  Hemiplegia - Right/Left Right  Hemiplegia - dominant/non-dominant Non-Dominant  Hemiplegia - caused by Cerebral infarction  OT Frequency (ACUTE ONLY) Min 2X/week  Follow Up Recommendations SNF;Supervision/Assistance - 24 hour  OT Equipment Other (comment) (TBA)  AM-PAC OT "6 Clicks" Daily Activity Outcome Measure  Help from another person eating meals? 1  Help from another person taking care of personal grooming? 2  Help from another person toileting, which includes using toliet, bedpan, or urinal? 1  Help from another person bathing (including washing, rinsing, drying)? 2  Help from another person to put on and taking off regular upper body clothing? 2  Help from another person to put on and taking off regular lower body clothing? 1  6 Click Score 9  ADL G Code Conversion CL  OT Goal Progression  Progress towards OT goals Not progressing toward goals - comment (reintubated 9/8)  Acute Rehab OT Goals  Patient Stated Goal per family to get therapy  OT Goal Formulation With patient/family  Time For Goal Achievement 07/06/17  Potential to Achieve Goals Good   ADL Goals  Pt Will Perform Eating with set-up;sitting  Pt Will Perform Grooming with set-up;sitting  Pt Will Perform Upper Body Bathing with min assist;sitting  Pt Will Transfer to Toilet with +2 assist;with min assist;bedside commode;stand pivot transfer  OT Time Calculation  OT Start Time (ACUTE ONLY) 1540  OT Stop Time (ACUTE ONLY) 1601  OT Time Calculation (min) 21 min  OT General Charges  $OT Visit 1 Visit  [facility], OT/L  [phone] 06/29/2017

## 2017-06-29 NOTE — Progress Notes (Signed)
Pharmacy Antibiotic Note  Melinda Noble is a 83 y.o. female admitted on 06/20/2017 with stroke.  Currently on D#4 of Levaquin for pneumonia. WBC is now wnl and patient is afebrile. SCr remains stable.   Plan: Levaquin 750mg  IV Q24H.  F/u renal fxn, C&S, clinical status Pharmacy to sign off since no further dosage adjustment necessary   Height: 5\' 8"  (172.7 cm) Weight: 213 lb 13.5 oz (97 kg) IBW/kg (Calculated) : 63.9  Temp (24hrs), Avg:98.8 F (37.1 C), Min:98.5 F (36.9 C), Max:99 F (37.2 C)   Recent Labs Lab 06/24/17 0410  06/26/17 0228 06/27/17 0445 06/27/17 1725 06/28/17 0510 06/29/17 0431  WBC 7.1  --  13.0* 10.0  --  9.1 9.2  CREATININE 0.55  < > 0.66 0.61 0.55 0.63 0.62  < > = values in this interval not displayed.  Estimated Creatinine Clearance: 63.7 mL/min (by C-G formula based on SCr of 0.62 mg/dL).    Allergies  Allergen Reactions  . Ace Inhibitors Anaphylaxis  . Beta Adrenergic Blockers Anaphylaxis  . Iohexol Anaphylaxis     Code: HIVES, Desc: anaphylactic shock s/p contrast injection many yrs ago--suggested that pt NEVER have iv contrast//a.c., Onset Date: [id number]   . Atorvastatin Rash  . Latex Rash  . Penicillins Swelling and Rash    Details are unknown  . Sulfa Antibiotics Rash    Antimicrobials this admission: Levaquin 9/8>>  Dose adjustments this admission: N/A  Microbiology results: 9/2 MRSA - NEG  Thank you for allowing pharmacy to be a part of this patient's care.  Albertina Parr, PharmD., BCPS Clinical Pharmacist Pager [phone]

## 2017-06-29 NOTE — Progress Notes (Signed)
STROKE TEAM PROGRESS NOTE   SUBJECTIVE (INTERVAL HISTORY) Daughter is at bedside. Pt still intubated but awake alert, interactive with provider. Still has aphasia but move all extremities. On weaning protocol. And likely extubated today.    OBJECTIVE Temp:  [98 F (36.7 C)-99 F (37.2 C)] 98.3 F (36.8 C) (09/11 1200) Pulse Rate:  [51-118] 66 (09/11 1230) Cardiac Rhythm: Atrial fibrillation (09/11 0800) Resp:  [11-24] 22 (09/11 1230) BP: (69-164)/(44-127) 91/75 (09/11 1230) SpO2:  [92 %-100 %] 100 % (09/11 1310) FiO2 (%):  [30 %] 30 % (09/11 1252) Weight:  [213 lb 13.5 oz (97 kg)] 213 lb 13.5 oz (97 kg) (09/11 0500)  CBC:   Recent Labs Lab 06/27/17 0445 06/28/17 0510 06/29/17 0431  WBC 10.0 9.1 9.2  NEUTROABS 6.9 6.1  --   HGB 8.1* 8.2* 8.1*  HCT 24.9* 26.2* 26.3*  MCV 92.6 93.9 95.6  PLT 172 196 798    Basic Metabolic Panel:   Recent Labs Lab 06/27/17 1725 06/28/17 0510 06/28/17 1745 06/29/17 0431  NA 140 142  --  140  K 2.8* 3.5  --  3.5  CL 104 107  --  107  CO2 28 30  --  29  GLUCOSE 104* 142*  --  139*  BUN 28* 34*  --  33*  CREATININE 0.55 0.63  --  0.62  CALCIUM 8.7* 9.1  --  9.3  MG 2.1 1.8  --   --   PHOS 1.9* 1.6* 3.5 2.1*    Lipid Panel:     Component Value Date/Time   CHOL 133 06/21/2017 0349   CHOL 152 01/26/2017 0855   TRIG 99 06/26/2017 0949   HDL 45 06/21/2017 0349   HDL 56 01/26/2017 0855   CHOLHDL 3.0 06/21/2017 0349   VLDL 24 06/21/2017 0349   LDLCALC 64 06/21/2017 0349   LDLCALC 78 01/26/2017 0855   HgbA1c:  Lab Results  Component Value Date   HGBA1C 5.4 06/21/2017   Urine Drug Screen:     Component Value Date/Time   LABOPIA NONE DETECTED 06/20/2017 2201   COCAINSCRNUR NONE DETECTED 06/20/2017 2201   LABBENZ POSITIVE (A) 06/20/2017 2201   AMPHETMU NONE DETECTED 06/20/2017 2201   THCU NONE DETECTED 06/20/2017 2201   LABBARB NONE DETECTED 06/20/2017 2201    Alcohol Level No results found for: [place] I have  personally reviewed the radiological images below and agree with the radiology interpretations.  Ct Head Code Stroke Wo Contrast 06/20/2017 IMPRESSION:  1. No significant interval change.  2. Stable atrophy and white matter disease.  3. No definite hyperdense vessel.  4. Stable right periorbital hematoma.  5. ASPECTS is 10/10   06/20/2017 IMPRESSION:  1. Stable atrophy and white matter disease.  2. No acute intracranial abnormality or significant interval change.  3. Prominent right infraorbital and lateral soft tissue hematoma without underlying fracture or intraorbital extension.  4. ASPECTS is 10/10  06/20/2017 IMPRESSION:  1. Focal density posteriorly in the left sylvian fissure may reflect contrast associated with a more distal occlusion.  2. No acute hemorrhage.  3. No evidence for progressing infarct.  4. Stable atrophy and white matter disease.  5. Increasing size of right infraorbital hematoma.   06/21/2017 1. New left posterolateral temporal lobe acute infarction.  2. Stable infarction centered in left frontal centrum semiovale extending into left superior lentiform nucleus and insula in comparison with prior CT/MRI head. Interval increase in edema and local mass effect associated with the infarct.  3. No acute intracranial hemorrhage.   Ct Maxillofacial Wo Contrast 06/20/2017 IMPRESSION:  1. No maxillofacial fracture.  2. Right infraorbital and periorbital soft tissue hematoma.   [name] Head Wo Contrast 06/20/2017 IMPRESSION:  1. High-grade stenosis or occlusion of the distal left M1 segment with nonvisualization of distal MCA branch vessels. This corresponds with the patient's acute symptoms, likely representing an emergent large vessel occlusion.  2. Narrowing of the right M1 segment and attenuation of MCA branch vessels likely related to atherosclerotic disease.  3. Posterior circulation is intact.  4. Ill-defined area of restricted diffusion within white matter of the  left centrum semi ovale may reflect acute ischemia. No definite cortical infarct is present.  Mr Brain Wo Contrast 06/21/2017 IMPRESSION:  1. Increased size of acute/subacute left MCA territory infarct, now involving the left insular cortex, left frontal operculum, posterior left temporal and parietal lobe, and more anterior superior left frontal lobe.  2. No evidence for hemorrhagic conversion of left-sided infarcts.  3. Focal acute nonhemorrhagic infarct in the right parietal lobe and centrum semiovale. Bilateral disease raises concern for a central cardio embolic source.  4. Right periorbital hematoma.   TTE 06/20/2017  Left ventricle: The cavity size was normal. Wall thickness was   normal. Systolic function was mildly reduced. The estimated   ejection fraction was in the range of 45% to 50%. There is   akinesis of the apical myocardium. Doppler parameters are   consistent with abnormal left ventricular relaxation (grade 1   diastolic dysfunction).   LE arterial doppler - Pseudoaneurysm of the right groin measuring 4.68 x 4.11 cm with surrounding hematoma. Pseudo neck measures 0.58 cm and is 1.36 cm long.  UE venous doppler - There is no DVT or SVT noted in the bilateral upper extremities.     PHYSICAL EXAM Vitals:   06/29/17 1215 06/29/17 1230 06/29/17 1252 06/29/17 1310  BP: 122/68 91/75    Pulse: 69 66    Resp: (!) 22 (!) 22    Temp:      TempSrc:      SpO2: 99% 100% 97% 100%  Weight:      Height:        Temp:  [98 F (36.7 C)-99 F (37.2 C)] 98.3 F (36.8 C) (09/11 1200) Pulse Rate:  [51-118] 66 (09/11 1230) Resp:  [11-24] 22 (09/11 1230) BP: (69-164)/(44-127) 91/75 (09/11 1230) SpO2:  [92 %-100 %] 100 % (09/11 1310) FiO2 (%):  [30 %] 30 % (09/11 1252) Weight:  [213 lb 13.5 oz (97 kg)] 213 lb 13.5 oz (97 kg) (09/11 0500)  General - Well nourished, well developed, intubated, on sedation  Ophthalmologic - Fundi not visualized due to  noncooperation.  Cardiovascular - Regular rate and rhythm.  Neuro - awake, alert, eyes open, interactive, try to mouth with tube. However, not following commands, consistent with aphasia, but able to pantomime. Eyes attending to both sides, but incomplete bilateral horizontal eye movement. PERRL, positive corneal and gag, blinking to visual threat bilaterally. Facial symmetry not able to test due to ET tube. RUE 4+/5 but no drift, left UE 5/5. RLE 3+/5 and LLE 4+/5. DTR 1+ and no babinski. Sensation, coordination and gait not tested.   ASSESSMENT/PLAN Ms. Melinda Noble is a 83 y.o. female with history of history of PAF not on AC, HTN, CAD/MI s/p stent, CHF, HLD who presents with right sided weakness. She is status post IV tPA 0941 on 06/20/2017 and IR mechanical thrombectomy.  Stroke: acute/subacute  L MCA infarcts and right MCA punctate infarcts, embolic, likely due to PAF not on AC, s/p tPA and mechanical thrombectomy of left MCA with TICI 2b reperfusion  Resultant intubated  CT head: no acute stroke.  Prominent right infraorbital and lateral soft tissue hematoma without underlying fracture or intraorbital extension  MRI head: acute/subacute L MCA infarct, involving the left insular cortex, left frontal operculum, posterior left temporal and parietal lobe, and more anterior superior left frontal lobe.  Focal acute nonhemorrhagic infarct in the right parietal lobe and centrum semiovale.  MRA head: High-grade L M1 stenosis.  Ill-defined area of restricted diffusion within white matter of the left centrum semi ovale may reflect acute ischemia  IR / DSA - left M1 mechanical thrombectomy with TICI2b reperfusion  TTE EF 45% to 50%  UE venous doppler - no DVT  LDL 64  HgbA1c 5.4  SCDs for VTE prophylaxis Diet NPO time specified  aspirin 81 mg daily prior to admission, now on aspirin 81mg  and plavix 75mg  daily. will consider AC if no procedure planned  Patient counseled to be compliant with  her antithrombotic medications  Ongoing aggressive stroke risk factor management  Therapy recommendations:   pending  Disposition:  Pending  PAF not on AC  AF pre-MI but not sure about PAF after MI but on amiodarone   Follows with Dr. Tamala Julian in cardiology  On ASA now  Consider [facility] if no procedure planned.  Respiratory failure  Intubated - extubated - re-intubated  Continue diuresis  CCM on board  Expect extubation today  Right femoral pseudoaneurysm  S/p emergency repair 06/23/17  Right groin JP drain d/c'ed  Follow up with [facility] as outpt  Hypotension  BP improved Off pressor CVP monitoring Long-term BP goal normotensive  Anemia, acute blood loss  Hb 9.4->6.1->7.8->8.9->9.3->8.1->8.2->8.1  Close CBC monitoring  Continue ASA and plavix  Dysphagia   Did not pass swallow before re-intubation  Silent aspiration before re-intubation  Speech on board  May need PEG  Hyperlipidemia  Home meds: Rosuvastatin 5 mg daily  LDL 64, goal < 70  On crestor 10mg  now  Continue statin at discharge  Other Stroke Risk Factors  Advanced age  Obesity, Body mass index is 32.52 kg/m., recommend weight loss, diet and exercise as appropriate   CAD/MI s/p stent - on ASA now  Other Active Problems  Leukocytosis, resolved 7.1 - 13.0 - 9.1->9.2  aphasia  Hospital day # 9  This patient is critically ill and at significant risk of neurological worsening, death and care requires constant monitoring of vital signs, hemodynamics,respiratory and cardiac monitoring, extensive review of multiple databases, frequent neurological assessment, discussion with family, other specialists and medical decision making of high complexity.I have made any additions or clarifications directly to the above note.This critical care time does not reflect procedure time, or teaching time or supervisory time of PA/NP/Med Resident etc but could involve care discussion time. I spent 35 minutes of  neurocritical care time  in the care of  this patient.  Rosalin Hawking, MD PhD Stroke Neurology 06/29/2017 3:49 PM   To contact Stroke Continuity provider, please refer to [email or web address] After hours, contact General Neurology

## 2017-06-29 NOTE — Procedures (Signed)
Extubation Procedure Note  Patient Details:   Name: Melinda Noble DOB: 1930/08/23 MRN: [id number]   Airway Documentation:     Evaluation  O2 sats: stable throughout Complications: No apparent complications Patient did tolerate procedure well. Bilateral Breath Sounds: Clear, Diminished   Yes   Pt extubated to 4l [place].  No complications noted.  Minimal leak noted prior to extubation.  Pt has weak voice and strong cough post extubation.  Will continue to monitor.  Phillis Knack [facility] 06/29/2017, 1:11 PM

## 2017-06-29 NOTE — Progress Notes (Signed)
RN went to administer meds via cortrak. Bridal was disconnected and tube slightly pulled out. Cortrak team not on today. Dr. Erlinda Hong notified. Stated that it was ok to hold off on meds today and see if pt either passes swallow eval per ST or has cortrak reinserted. Will continue to monitor

## 2017-06-29 NOTE — Care Management Note (Signed)
Case Management Note  Patient Details  Name: Melinda Noble MRN: [id number] Date of Birth: Mar 02, 1930  Subjective/Objective:                 Patient admitted from [place]. Sx 9/5 Repair of right common femoral artery pseudoaneurysm, +CVA.    Action/Plan:  Anticipate SNF at [place] at [place], CSW following.  Expected Discharge Date:                  Expected Discharge Plan:  Skilled Nursing Facility  In-House Referral:  Clinical Social Work  Discharge planning Services  CM Consult  Post Acute Care Choice:    Choice offered to:     DME Arranged:    DME Agency:     HH Arranged:    [place] Agency:     Status of Service:  In process, will continue to follow  If discussed at Long Length of Stay Meetings, dates discussed:    Additional Comments:  06/29/17 J. Khamiyah Grefe, RN, BSN Pt extubated today; family does not want reintubation.  Currently remains NPO per ST recommendations.  Will follow.    Ella Bodo, RN 06/29/2017, 5:16 PM

## 2017-06-30 ENCOUNTER — Inpatient Hospital Stay (HOSPITAL_COMMUNITY): Payer: Medicare Other

## 2017-06-30 LAB — BASIC METABOLIC PANEL
ANION GAP: 5 (ref 5–15)
BUN: 20 mg/dL (ref 6–20)
CHLORIDE: 103 mmol/L (ref 101–111)
CO2: 33 mmol/L — ABNORMAL HIGH (ref 22–32)
Calcium: 9.5 mg/dL (ref 8.9–10.3)
Creatinine, Ser: 0.69 mg/dL (ref 0.44–1.00)
GFR calc non Af Amer: >60 mL/min (ref >60)
Glucose, Bld: 115 mg/dL — ABNORMAL HIGH (ref 65–99)
POTASSIUM: 3.4 mmol/L — AB (ref 3.5–5.1)
Sodium: 141 mmol/L (ref 135–145)

## 2017-06-30 LAB — GLUCOSE, CAPILLARY
GLUCOSE-CAPILLARY: 110 mg/dL — AB (ref 65–99)
GLUCOSE-CAPILLARY: 111 mg/dL — AB (ref 65–99)
GLUCOSE-CAPILLARY: 125 mg/dL — AB (ref 65–99)
Glucose-Capillary: 111 mg/dL — ABNORMAL HIGH (ref 65–99)
Glucose-Capillary: 124 mg/dL — ABNORMAL HIGH (ref 65–99)

## 2017-06-30 LAB — PHOSPHORUS: PHOSPHORUS: 2.8 mg/dL (ref 2.5–4.6)

## 2017-06-30 LAB — MAGNESIUM: MAGNESIUM: 1.6 mg/dL — AB (ref 1.7–2.4)

## 2017-06-30 MED ORDER — POTASSIUM CHLORIDE 10 MEQ/50ML IV SOLN
10.0000 meq | INTRAVENOUS | Status: AC
  Administered 2017-06-30 (×4): 10 meq via INTRAVENOUS
  Filled 2017-06-30 (×3): qty 50

## 2017-06-30 MED ORDER — RESOURCE THICKENUP CLEAR PO POWD
Freq: Once | ORAL | Status: AC
  Filled 2017-06-30: qty 125

## 2017-06-30 MED ORDER — POTASSIUM CHLORIDE 10 MEQ/50ML IV SOLN
INTRAVENOUS | Status: AC
  Filled 2017-06-30: qty 50

## 2017-06-30 MED ORDER — FUROSEMIDE 10 MG/ML IJ SOLN
60.0000 mg | Freq: Two times a day (BID) | INTRAMUSCULAR | Status: DC
  Administered 2017-06-30 – 2017-07-02 (×5): 60 mg via INTRAVENOUS
  Filled 2017-06-30: qty 8
  Filled 2017-06-30: qty 6
  Filled 2017-06-30 (×3): qty 8

## 2017-06-30 NOTE — Progress Notes (Signed)
PULMONARY / CRITICAL CARE MEDICINE   Name: Melinda Noble MRN: [id number] DOB: November 24, 1929    ADMISSION DATE:  06/20/2017 CONSULTATION DATE:  9/2  REFERRING [email or web address]   CHIEF COMPLAINT:   Vent management post CVA critical care   HISTORY OF PRESENT ILLNESS:   This is a 83 year old female w/ sig h/o atrial fib (not on systemic A/C). Presented to the ER on 9/2 w/ new (R) sided hemiplegia (she was independent w/ ADLs prior to this).   ER course: MRA ->felt trickle flow to left MCA but no areas of infarct.  -received TPA in the ER-->clinically actually declined.  -repeat CT head-->negative for ICH -went to IR: for cerebral arteriogram and endo vascularization.  -returned to the ICU on vent. PCCM asked to a/w care   SUBJECTIVE:  Extubated DNi No distress Off neo  VITAL SIGNS: BP 121/60   Pulse (!) 39   Temp 98.4 F (36.9 C) (Axillary)   Resp (!) 25   Ht 5\' 8"  (1.727 m)   Wt 92 kg (202 lb 13.2 oz)   SpO2 99%   BMI 30.84 kg/m    INTAKE / OUTPUT: I/O last 3 completed shifts: In: 3840.4 [I.V.:2480.7; NG/GT:533.3; IV Piggyback:826.3] Out: 6075 [Urine:6075]  PHYSICAL EXAMINATION: General: no distress Neuro: awake, mimic commands, global aphasia HEENT: ett gone, brusing better PULM:  Cough strong, ronchi clears with cough CV: s1 s2 RRR GI: soft, BS wnl, no r Extremities: edema remains    LABS:  BMET  Recent Labs Lab 06/28/17 0510 06/29/17 0431 06/30/17 0413  NA 142 140 141  K 3.5 3.5 3.4*  CL 107 107 103  CO2 30 29 33*  BUN 34* 33* 20  CREATININE 0.63 0.62 0.69  GLUCOSE 142* 139* 115*   Electrolytes  Recent Labs Lab 06/27/17 1725 06/28/17 0510 06/28/17 1745 06/29/17 0431 06/30/17 0413  CALCIUM 8.7* 9.1  --  9.3 9.5  MG 2.1 1.8  --   --  1.6*  PHOS 1.9* 1.6* 3.5 2.1* 2.8   CBC  Recent Labs Lab 06/27/17 0445 06/28/17 0510 06/29/17 0431  WBC 10.0 9.1 9.2  HGB 8.1* 8.2* 8.1*  HCT 24.9* 26.2* 26.3*  PLT 172 196 201   Coag's  Recent  Labs Lab 06/23/17 2335  APTT 33  INR 1.27   Sepsis Markers No results for input(s): LATICACIDVEN, PROCALCITON, O2SATVEN in the last 168 hours.  ABG  Recent Labs Lab 06/24/17 0015 06/26/17 0328 06/26/17 1011  PHART 7.313* 7.249* 7.351  PCO2ART 41.0 57.6* 43.8  PO2ART 120.0* 146* 308.0*   Liver Enzymes  Recent Labs Lab 06/24/17 0410  ALBUMIN 2.7*   Cardiac Enzymes  Recent Labs Lab 06/26/17 0949 06/26/17 1803  TROPONINI 1.19* 1.61*    Glucose  Recent Labs Lab 06/29/17 1147 06/29/17 1628 06/29/17 1936 06/29/17 2350 06/30/17 0338 06/30/17 0757  GLUCAP 142* 114* 106* 119* 111* 110*   Imaging  STUDIES:  CT head 9/2: 1. Focal density posteriorly in the left sylvian fissure may reflect contrast associated with a more distal occlusion. 2. No acute hemorrhage. 3. No evidence for progressing infarct. 4. Stable atrophy and white matter disease. 5. Increasing size of right infraorbital hematoma. MCA 9/2:  High grade stenosis or occlusion of the distal M1 seg w/ nonvisualization of the distal MCA branch   CXR 9/2: ET tube in good position, mild venous congestion. No infiltrate.  CULTURES: Sputum 9/8>>>few GNR, rare GPC>>>nF  ANTIBIOTICS: 9/8 levoloxacin>>>  SIGNIFICANT EVENTS: 9/5>> OR for repair of R  femoral pseudoaneurysm 9/7 extubated 9/8- pulm edema? Asp? NIMV 9/9 pos balance  LINES/TUBES: ETT 9/2>>>9/4, 9/5>>>9/6  DISCUSSION: Acute left MCA stroke w/ progression in spite of TPA. Now s/p IR for endovascularization. CCM on board for vent management. Extubated 9/6  ASSESSMENT / PLAN:  PULMONARY A: Need for mechanical ventilation for altered mental status.  Reintubated for OR 9/5  abg reviewed PCXR still pending Successfully extubated 9/6, protecting airway 9/8 recurrent resp failure, favor pulm edema, r/o asp P:   No distress Neg balance renal tolerates May need NTS pcxr improving with neg balance  CARDIOVASCULAR A:  Mild HTN  H/o  afib Converted to rate controlled a-fib 9/7 early am Stress ischemia P:  Lasix  dc tele  RENAL A:   Pulm edema resolving Hypokalemia P:   k-phos  Lasix slight reduction Chem in am kvo k supp  GASTROINTESTINAL A:   R/o dysphagia P:   Feeding hold Slp, if fail then NGT Ppi, dc if NOT home med  HEMATOLOGIC A:   No acute w/out evidence of bleeding Thrombocytopenia>> ? Consumptive from hematoma formation - improved Bloody drainage from JP ( 70 cc last pm)  P:  Some hemoconcentration with lasix scd to left  INFECTIOUS A:   At risk aspiration? 9/8 P:   Follow fever curve  ENDOCRINE A:   Hypothyroidism Hypoglycemic overnight 9/7 P:  CBG's Q 4  IV Synthroid  NEUROLOGIC A:   Left MCA stroke w/ right sided hemiplegia. Now s/p TPA and revascularization in IR delrium P:   Supportive care  Per neuro  Behavior modification  May need re add haldol  dnr , dni, comfort if fails  To floor Will sign off   Lavon Paganini. Titus Mould, MD, [place] Pgr: [place]

## 2017-06-30 NOTE — Clinical Social Work Note (Signed)
Clinical Social Worker continuing to follow patient and family for support and discharge planning needs.  Patient has an available bed at [facility] where she is a current independent living resident.  Patient is scheduled for swallow evaluation today to determine plan of care moving forward.  CSW remains available for support and to facilitate patient discharge needs once medically stable.  Barbette Or, [id number]

## 2017-06-30 NOTE — Progress Notes (Signed)
STROKE TEAM PROGRESS NOTE   SUBJECTIVE (INTERVAL HISTORY) Husband is at bedside. Pt extubated yesterday and tolerating well. Still has partial global aphasia, but able to follow "eye close" and says her first name and "thank you". Speech pending. If not able to pass, need cortrak.     OBJECTIVE Temp:  [98.2 F (36.8 C)-99.1 F (37.3 C)] 99.1 F (37.3 C) (09/12 2120) Pulse Rate:  [39-143] 77 (09/12 2120) Cardiac Rhythm: Atrial fibrillation (09/12 2000) Resp:  [13-26] 16 (09/12 2120) BP: (80-133)/(40-97) 106/63 (09/12 2120) SpO2:  [91 %-100 %] 94 % (09/12 2120) Weight:  [202 lb 13.2 oz (92 kg)-207 lb 7.3 oz (94.1 kg)] 207 lb 7.3 oz (94.1 kg) (09/12 2120)  CBC:   Recent Labs Lab 06/27/17 0445 06/28/17 0510 06/29/17 0431  WBC 10.0 9.1 9.2  NEUTROABS 6.9 6.1  --   HGB 8.1* 8.2* 8.1*  HCT 24.9* 26.2* 26.3*  MCV 92.6 93.9 95.6  PLT 172 196 355    Basic Metabolic Panel:   Recent Labs Lab 06/28/17 0510  06/29/17 0431 06/30/17 0413  NA 142  --  140 141  K 3.5  --  3.5 3.4*  CL 107  --  107 103  CO2 30  --  29 33*  GLUCOSE 142*  --  139* 115*  BUN 34*  --  33* 20  CREATININE 0.63  --  0.62 0.69  CALCIUM 9.1  --  9.3 9.5  MG 1.8  --   --  1.6*  PHOS 1.6*  < > 2.1* 2.8  < > = values in this interval not displayed.  Lipid Panel:     Component Value Date/Time   CHOL 133 06/21/2017 0349   CHOL 152 01/26/2017 0855   TRIG 99 06/26/2017 0949   HDL 45 06/21/2017 0349   HDL 56 01/26/2017 0855   CHOLHDL 3.0 06/21/2017 0349   VLDL 24 06/21/2017 0349   LDLCALC 64 06/21/2017 0349   LDLCALC 78 01/26/2017 0855   HgbA1c:  Lab Results  Component Value Date   HGBA1C 5.4 06/21/2017   Urine Drug Screen:     Component Value Date/Time   LABOPIA NONE DETECTED 06/20/2017 2201   COCAINSCRNUR NONE DETECTED 06/20/2017 2201   LABBENZ POSITIVE (A) 06/20/2017 2201   AMPHETMU NONE DETECTED 06/20/2017 2201   THCU NONE DETECTED 06/20/2017 2201   LABBARB NONE DETECTED 06/20/2017 2201     Alcohol Level No results found for: [place] I have personally reviewed the radiological images below and agree with the radiology interpretations.  Ct Head Code Stroke Wo Contrast 06/20/2017 IMPRESSION:  1. No significant interval change.  2. Stable atrophy and white matter disease.  3. No definite hyperdense vessel.  4. Stable right periorbital hematoma.  5. ASPECTS is 10/10   06/20/2017 IMPRESSION:  1. Stable atrophy and white matter disease.  2. No acute intracranial abnormality or significant interval change.  3. Prominent right infraorbital and lateral soft tissue hematoma without underlying fracture or intraorbital extension.  4. ASPECTS is 10/10  06/20/2017 IMPRESSION:  1. Focal density posteriorly in the left sylvian fissure may reflect contrast associated with a more distal occlusion.  2. No acute hemorrhage.  3. No evidence for progressing infarct.  4. Stable atrophy and white matter disease.  5. Increasing size of right infraorbital hematoma.   06/21/2017 1. New left posterolateral temporal lobe acute infarction.  2. Stable infarction centered in left frontal centrum semiovale extending into left superior lentiform nucleus and insula in comparison  with prior CT/MRI head. Interval increase in edema and local mass effect associated with the infarct.  3. No acute intracranial hemorrhage.   Ct Maxillofacial Wo Contrast 06/20/2017 IMPRESSION:  1. No maxillofacial fracture.  2. Right infraorbital and periorbital soft tissue hematoma.   [name] Head Wo Contrast 06/20/2017 IMPRESSION:  1. High-grade stenosis or occlusion of the distal left M1 segment with nonvisualization of distal MCA branch vessels. This corresponds with the patient's acute symptoms, likely representing an emergent large vessel occlusion.  2. Narrowing of the right M1 segment and attenuation of MCA branch vessels likely related to atherosclerotic disease.  3. Posterior circulation is intact.  4.  Ill-defined area of restricted diffusion within white matter of the left centrum semi ovale may reflect acute ischemia. No definite cortical infarct is present.  Mr Brain Wo Contrast 06/21/2017 IMPRESSION:  1. Increased size of acute/subacute left MCA territory infarct, now involving the left insular cortex, left frontal operculum, posterior left temporal and parietal lobe, and more anterior superior left frontal lobe.  2. No evidence for hemorrhagic conversion of left-sided infarcts.  3. Focal acute nonhemorrhagic infarct in the right parietal lobe and centrum semiovale. Bilateral disease raises concern for a central cardio embolic source.  4. Right periorbital hematoma.   TTE 06/20/2017  Left ventricle: The cavity size was normal. Wall thickness was   normal. Systolic function was mildly reduced. The estimated   ejection fraction was in the range of 45% to 50%. There is   akinesis of the apical myocardium. Doppler parameters are   consistent with abnormal left ventricular relaxation (grade 1   diastolic dysfunction).   LE arterial doppler - Pseudoaneurysm of the right groin measuring 4.68 x 4.11 cm with surrounding hematoma. Pseudo neck measures 0.58 cm and is 1.36 cm long.  UE venous doppler - There is no DVT or SVT noted in the bilateral upper extremities.     PHYSICAL EXAM Vitals:   06/30/17 1800 06/30/17 1900 06/30/17 2000 06/30/17 2120  BP: 109/64 107/83 (!) 104/58 106/63  Pulse: (!) 45 91  77  Resp: 19 19 17 16   Temp:    99.1 F (37.3 C)  TempSrc:    Oral  SpO2: 98% 98% 98% 94%  Weight:    207 lb 7.3 oz (94.1 kg)  Height:    5\' 5"  (1.651 m)    Temp:  [98.2 F (36.8 C)-99.1 F (37.3 C)] 99.1 F (37.3 C) (09/12 2120) Pulse Rate:  [39-143] 77 (09/12 2120) Resp:  [13-26] 16 (09/12 2120) BP: (80-133)/(40-97) 106/63 (09/12 2120) SpO2:  [91 %-100 %] 94 % (09/12 2120) Weight:  [202 lb 13.2 oz (92 kg)-207 lb 7.3 oz (94.1 kg)] 207 lb 7.3 oz (94.1 kg) (09/12 2120)  General  - Well nourished, well developed, extubated, not in distress, right facial ecchymoses   Ophthalmologic - Fundi not visualized due to noncooperation.  Cardiovascular - Regular rate and rhythm.  Neuro - awake, alert, eyes open, interactive, partial global aphasia, able to follow very limited commands such as "close eyes", able to say very limited words including her name and "thank you". Eyes attending to both sides, PERRL, EOMI, blinking to visual threat bilaterally. Facial symmetry, tongue in middle in mouth. BUE 5/5, no drift. BLE 4/5. DTR 1+ and no babinski. Sensation symmetical, coordination not cooperative and gait not tested.   ASSESSMENT/PLAN Melinda Noble is a 84 y.o. female with history of history of PAF not on AC, HTN, CAD/MI s/p stent, CHF, HLD  who presents with right sided weakness. She is status post IV tPA 0941 on 06/20/2017 and IR mechanical thrombectomy.  Stroke: acute/subacute L MCA infarcts and right MCA punctate infarcts, embolic, likely due to PAF not on AC, s/p tPA and mechanical thrombectomy of left MCA with TICI 2b reperfusion  Resultant intubated  CT head: no acute stroke.  Prominent right infraorbital and lateral soft tissue hematoma without underlying fracture or intraorbital extension  MRI head: acute/subacute L MCA infarct, involving the left insular cortex, left frontal operculum, posterior left temporal and parietal lobe, and more anterior superior left frontal lobe.  Focal acute nonhemorrhagic infarct in the right parietal lobe and centrum semiovale.  MRA head: High-grade L M1 stenosis.  Ill-defined area of restricted diffusion within white matter of the left centrum semi ovale may reflect acute ischemia  IR / DSA - left M1 mechanical thrombectomy with TICI2b reperfusion  TTE EF 45% to 50%  UE venous doppler - no DVT  LDL 64  HgbA1c 5.4  SCDs for VTE prophylaxis DIET - DYS 1 Room service appropriate? Yes; Fluid consistency: Honey Thick  aspirin 81 mg  daily prior to admission, now on aspirin 81mg  and plavix 75mg  daily. will consider AC if no procedure planned  Patient counseled to be compliant with her antithrombotic medications  Ongoing aggressive stroke risk factor management  Therapy recommendations:   SNF  Disposition:  Pending  PAF not on AC  AF pre-MI but not sure about PAF after MI but on amiodarone   Follows with Dr. Tamala Julian in cardiology  On ASA now  Consider [facility] if no procedure planned.  Respiratory failure  Intubated - extubated - re-intubated - extubated  Continue diuresis  Tolerating well  Right femoral pseudoaneurysm  S/p emergency repair 06/23/17  Right groin JP drain d/c'ed  Follow up with [facility] as outpt  Hypotension  BP still on the low side Off pressor Long-term BP goal normotensive  Anemia, acute blood loss  Hb 9.4->6.1->7.8->8.9->9.3->8.1->8.2->8.1  Close CBC monitoring  Continue ASA and plavix  Dysphagia   Did not pass swallow before re-intubation  Silent aspiration before re-intubation  Speech on board  Currently Dysphagia 1 (Puree) solids;Honey thick liquids  Hyperlipidemia  Home meds: Rosuvastatin 5 mg daily  LDL 64, goal < 70  On crestor 10mg  now  Continue statin at discharge  Other Stroke Risk Factors  Advanced age  Obesity, Body mass index is 34.52 kg/m., recommend weight loss, diet and exercise as appropriate   CAD/MI s/p stent - on ASA now  Other Active Problems  Leukocytosis, resolved 7.1 - 13.0 - 9.1->9.2  aphasia  Hospital day # 10  This patient is critically ill and at significant risk of neurological worsening, death and care requires constant monitoring of vital signs, hemodynamics,respiratory and cardiac monitoring, extensive review of multiple databases, frequent neurological assessment, discussion with family, other specialists and medical decision making of high complexity.I have made any additions or clarifications directly to the above  note.This critical care time does not reflect procedure time, or teaching time or supervisory time of PA/NP/Med Resident etc but could involve care discussion time. I spent 35 minutes of neurocritical care time  in the care of  this patient.  Rosalin Hawking, MD PhD Stroke Neurology 06/30/2017 11:03 PM   To contact Stroke Continuity provider, please refer to [email or web address] After hours, contact General Neurology

## 2017-06-30 NOTE — Progress Notes (Signed)
Modified Barium Swallow Progress Note  Patient Details  Name: Melinda Noble MRN: [id number] Date of Birth: 27-May-1930  Today's Date: 06/30/2017  Modified Barium Swallow completed.  Full report located under Chart Review in the Imaging Section.  Brief recommendations include the following:  Clinical Impression  Pt presents with a moderate-severe primary oral dysphagia, with notable improvements in pharyngeal function.  There remained poor oral control of POs, diffuse spreading throughout cavity, with pt tending to hold POs orally.  She required max verbal/tactile cues to sustain motor activity in the oral phase.  After significant delay, portions of bolus passed into pharynx.  Once swallow triggered, she safely protected her airway with purees, honey, and nectar-thick liquids.  Thin liquids were immediately aspirated, eliciting a cough response. There was limited residue post-swallow.  Discussed results via phone with pt's daughter, Vinnie Level; reviewed results and video with pt's spouse.  They both agree they would like to proceed with an oral diet.  We discussed the ongoing risk of aspiration.  We decided to start cautiously with a dysphagia 1 diet, honey-thick liquids.  Pt will need 1:1 assist/cues during meal times to ensure safety.  Family verbalizes understanding.  SLP will continue to follow for safety/diet progression/therapeutic exercise.    Swallow Evaluation Recommendations       SLP Diet Recommendations: Dysphagia 1 (Puree) solids;Honey thick liquids   Liquid Administration via: Cup   Medication Administration: Crushed with puree   Supervision: Staff to assist with self feeding   Compensations: Slow rate;Small sips/bites;Minimize environmental distractions;Lingual sweep for clearance of pocketing   Postural Changes: Remain semi-upright after after feeds/meals (Comment)   Oral Care Recommendations: Oral care BID   Other Recommendations: Order thickener from  [place], [facility] [place] 06/30/2017,3:45 PM

## 2017-06-30 NOTE — Progress Notes (Signed)
Nutrition Follow-up  DOCUMENTATION CODES:   Obesity unspecified  INTERVENTION:   Magic cup TID with meals, each supplement provides 290 kcal and 9 grams of protein    NUTRITION DIAGNOSIS:   Inadequate oral intake related to dysphagia as evidenced by  (diet just advanced). Progressing.   GOAL:   Patient will meet greater than or equal to 90% of their needs Progressing.   MONITOR:   Diet advancement, Supplement acceptance, PO intake  ASSESSMENT:   Pt with PMH of afib, HTN, ischemic cardiomyopathy, CAD, anemia admitted with L MCA stroke with progression despite tPA, now s/p IR for endovascularization.   9/11 extubated, pt pulled Cortrak tube out 9/12 diet advanced (SLP to put in order) Pt awake and alert Pt discussed during ICU rounds and with RN.    Medications reviewed and include: Oscal with D, vitamin D, lasix, synthroid, MVI, vitamin E Labs reviewed: K+ 3.4 (L)   Diet Order:  Diet NPO time specified  Skin:   (skin tear R arm)  Last BM:  9/9  Height:   Ht Readings from Last 1 Encounters:  06/27/17 5\' 8"  (1.727 m)    Weight:   Wt Readings from Last 1 Encounters:  06/30/17 202 lb 13.2 oz (92 kg)    Ideal Body Weight:  63.64 kg  BMI:  Body mass index is 30.84 kg/m.  Estimated Nutritional Needs:   Kcal:  1600-1800  Protein:  85-100 grams  Fluid:  Per MD  EDUCATION NEEDS:   No education needs identified at this time  [place], [place], [place] Pager [phone] After Hours Pager

## 2017-06-30 NOTE — Progress Notes (Signed)
Daily Progress Note   Patient Name: Melinda Noble       Date: 06/30/2017 DOB: 1930/10/11  Age: 83 y.o. MRN#: [id number] Attending Physician: Rosalin Hawking, MD Primary Care Physician: Gayland Curry, DO Admit Date: 06/20/2017  Reason for Consultation/Follow-up: Establishing goals of care and Psychosocial/spiritual support  Subjective: Patient resting in bed. She has been extubated and is verbal. She has difficulty with speaking and some words are unintelligible or not in context, but says "no" when asked if she has pain. She appears frustrated with trying to get her words out. Spoke with her husband Mr. Clutter via telephone. He acknowledges she has a long way to go. He states the plan is to move her to a room out of ICU, and then to rehab, and then hopefully back to independent living at [facility] where they reside. Palliative Care referral at SNF offered, and Mr. Vanwagoner states the facility is taking care of that now.       Length of Stay: 10  Current Medications: Scheduled Meds:  . aspirin  81 mg Per Tube Daily  . calcium-vitamin D  1 tablet Per Tube Daily  . carvedilol  6.25 mg Oral BID WC  . chlorhexidine  15 mL Mouth Rinse BID  . Chlorhexidine Gluconate Cloth  6 each Topical Daily  . cholecalciferol  1,000 Units Oral Daily  . clopidogrel  75 mg Per Tube Daily  . feeding supplement (PRO-STAT SUGAR FREE 64)  30 mL Per Tube TID  . feeding supplement (VITAL HIGH PROTEIN)  1,000 mL Per Tube Q24H  . furosemide  60 mg Intravenous Q12H  . levothyroxine  50 mcg Intravenous Daily  . mouth rinse  15 mL Mouth Rinse q12n4p  . montelukast  10 mg Per Tube QHS  . multivitamin with minerals  1 tablet Per Tube Daily  . [place]   Oral Once  . rosuvastatin  10 mg Per Tube Once per day on Mon  Wed Fri  . sertraline  100 mg Per Tube Daily  . sodium chloride flush  10-40 mL Intracatheter Q12H  . vitamin E  400 Units Per Tube Daily    Continuous Infusions: . potassium chloride    . dextrose 5 % and 0.9% NaCl 20 mL/hr at 06/30/17 0700  . famotidine (PEPCID) IV Stopped (06/30/17  1117)  . potassium chloride 10 mEq (06/30/17 1556)    PRN Meds: acetaminophen **OR** acetaminophen (TYLENOL) oral liquid 160 mg/5 mL **OR** acetaminophen, clobetasol ointment, ondansetron (ZOFRAN) IV, sodium chloride flush  Physical Exam  Constitutional: No distress.  Acutely ill appearing elderly female; on ventilator  HENT:  Head: Normocephalic.  Facial bruising  Eyes: EOM are normal.  Cardiovascular:  Warm and dry  Pulmonary/Chest: Effort normal.  On ventilator  Genitourinary:  Genitourinary Comments: Foley  Neurological: She is alert.  Difficulty with speaking. Some words unintelligible.   Skin: Skin is warm and dry.  Extensive facial bruising  Psychiatric:  Unable to test  Nursing note and vitals reviewed.           Vital Signs: BP (!) 102/54   Pulse 84   Temp 98.8 F (37.1 C) (Axillary)   Resp 18   Ht 5\' 8"  (1.727 m)   Wt 92 kg (202 lb 13.2 oz)   SpO2 98%   BMI 30.84 kg/m  SpO2: SpO2: 98 % O2 Device: O2 Device: Nasal Cannula O2 Flow Rate: O2 Flow Rate (L/min): 2 L/min  Intake/output summary:   Intake/Output Summary (Last 24 hours) at 06/30/17 1558 Last data filed at 06/30/17 1400  Gross per 24 hour  Intake          1212.64 ml  Output             4100 ml  Net         -2887.36 ml   LBM: Last BM Date: 06/27/17 Baseline Weight: Weight: 96.2 kg (212 lb 1.3 oz) (Per ED weighing bed prior to CT) Most recent weight: Weight: 92 kg (202 lb 13.2 oz)       Palliative Assessment/Data: 30%    Flowsheet Rows     Most Recent Value  Intake Tab  Referral Department  Critical care  Unit at Time of Referral  ICU  Palliative Care Primary Diagnosis  Neurology  Date Notified   06/26/17  Palliative Care Type  New Palliative care  Reason for referral  Clarify Goals of Care, Psychosocial or Spiritual support  Date of Admission  06/20/17  Date first seen by Palliative Care  06/26/17  # of days Palliative referral response time  0 Day(s)  # of days IP prior to Palliative referral  6  Clinical Assessment  Palliative Performance Scale Score  30%  Pain Max last 24 hours  Not able to report  Pain Min Last 24 hours  Not able to report  Dyspnea Max Last 24 Hours  Not able to report  Dyspnea Min Last 24 hours  Not able to report  Nausea Max Last 24 Hours  Not able to report  Nausea Min Last 24 Hours  Not able to report  Anxiety Max Last 24 Hours  Not able to report  Anxiety Min Last 24 Hours  Not able to report  Other Max Last 24 Hours  Not able to report  Psychosocial & Spiritual Assessment  Palliative Care Outcomes  Patient/Family meeting held?  Yes  Who was at the meeting?  Daughter Vinnie Level. Family mtg planned for 9/9 @2pm   Palliative Care Outcomes  Provided psychosocial or spiritual support  Palliative Care follow-up planned  Yes, Facility      Patient Active Problem List   Diagnosis Date Noted  . Endotracheal tube present   . Aneurysm of artery of lower extremity ([place])   . Palliative care by specialist   . Central venous catheter in place   .  Pseudoaneurysm of femoral artery ([place])   . Anemia   . Leukocytosis   . Cerebrovascular accident (CVA) ([place])   . Acute ischemic stroke ([place]) 06/20/2017  . Hypothyroidism 06/03/2016  . Polypharmacy 06/03/2016  . H/O amiodarone therapy 02/08/2014  . Chronic systolic heart failure ([place]) 02/08/2014  . Left shoulder pain 01/15/2014  . Chest pain 11/25/2013  . Obesity (BMI 30.0-34.9) 11/06/2013  . H/o Orthostatic hypotension, not on diuretic due to this 10/24/2013  . Generalized anxiety disorder 10/18/2013  . HTN (hypertension) 10/16/2013  . Cardiomyopathy, ischemic- 30-35% on admission 10/09/13 --> increased to  40-45% prior to discharge. 10/16/2013  . Dyslipidemia 10/16/2013  . Paroxysmal atrial fibrillation ([place]) 10/16/2013  . Pulmonary edema 10/11/2013  . Cardiogenic shock- resolved 10/10/2013  . Presence of drug coated stent in LAD coronary artery - 2 overlapping Promx DES 2.5 x 38, 2.5 x 12 (postdilated to ~3 mm) 10/10/2013  . Metabolic acidosis- resolved 10/10/2013  . Acute respiratory failure due to hypoxia from acute pulmonary edema- resolved 10/10/2013  . CAD S/P percutaneous coronary angioplasty -- PCI to LAD in setting Ant STEMI; 2 overlapping Promx DES 2.5 x 38, 2.5 x 12 (postdilated to ~3 mm) 10/09/2013    Palliative Care Assessment & Plan   Patient Profile: 83 y.o. female  with past medical history of Atrial fibrillation (not on anticoagulation), hypertension, coronary artery disease with history of STEMI 2014, hypothyroidism admitted on 06/20/2017 with new onset of right-sided weakness. Patient's husband reports at the time of admission, he was speaking with her while they were both getting dressed to go to church, when he stepped out of the room he heard her fall ,. Per MRA, new left MCA syndrome. CT of the head was negative for hemorrhage and she was taken to IR for neuro intervention , TPA, Fr sheath. After sheath was removed she developed a delayed hematoma and then pseudoaneurysm to right groin. Right femoral aneurysm was not amenable to injection and patient was taken to the OR for repair. Patient was intubated and transferred to ICU after repair. Patient has had agitation while in the hospital requiring Precedex as well as pressors. Patient extubated on 06/25/2017. On the evening of 06/25/2017 patient developed dropping O2 sats, shortness of breath and she was placed on a nonrebreather. Chest x-ray shows bilateral effusions and edema.    Assessment: Mrs. Michelin has been extubated and is showing improvement. Guarded prognosis. Spoke with patient's husband. Plans to move to non-ICU room,  and discharge to  SNF, and hopefully back to assisted living community.   Recommendations/Plan:  Do not re-intubate. Pt would not want a trach  Code status changed to full DNR. All family in agreement  Mr. Lewers states [facility] is managing arrangement of palliative care outpatient.   Goals of Care and Additional Recommendations:  Limitations on Scope of Treatment: No Tracheostomy  Code Status:    Code Status Orders        Start     Ordered   06/26/17 0213  Limited resuscitation (code)  Continuous    Question Answer Comment  In the event of cardiac or respiratory ARREST: Initiate Code Blue, Call Rapid Response No   In the event of cardiac or respiratory ARREST: Perform CPR No   In the event of cardiac or respiratory ARREST: Perform Intubation/Mechanical Ventilation Yes   In the event of cardiac or respiratory ARREST: Use NIPPV/BiPAp only if indicated Yes   In the event of cardiac or respiratory ARREST: Administer ACLS medications if  indicated No   In the event of cardiac or respiratory ARREST: Perform Defibrillation or Cardioversion if indicated No      06/26/17 0213    Code Status History    Date Active Date Inactive Code Status Order ID Comments User Context   06/20/2017  5:19 PM 06/26/2017  2:13 AM Full Code [id number]  Luanne Bras, MD Inpatient   06/20/2017  5:19 PM 06/20/2017  5:19 PM Full Code [id number]  Greta Doom, MD Inpatient   01/15/2014  8:59 PM 01/16/2014  7:04 PM Full Code [id number]  Charlie Pitter, PA-C Inpatient   11/26/2013  4:59 AM 11/26/2013  3:56 PM Full Code [id number]  Theressa Millard, MD Inpatient       Prognosis:   Unable to determine Depends on continued improvement. Could be > 6 months.   Discharge Planning:  Per SW notes, plans for SNF discharge to [facility].     Thank you for allowing the Palliative Medicine Team to assist in the care of this patient.   Total Time 30 min Prolonged Time Billed  no       Greater than 50%  of  this time was spent counseling and coordinating care related to the above assessment and plan.  Asencion Gowda, NP 06/30/2017 4:13 PM Office: [phone] 7am-7pm  Call primary team after hours  Please contact Palliative Medicine Team phone at [phone] for questions and concerns.

## 2017-07-01 DIAGNOSIS — I9589 Other hypotension: Secondary | ICD-10-CM

## 2017-07-01 DIAGNOSIS — R451 Restlessness and agitation: Secondary | ICD-10-CM

## 2017-07-01 LAB — GLUCOSE, CAPILLARY
GLUCOSE-CAPILLARY: 128 mg/dL — AB (ref 65–99)
Glucose-Capillary: 153 mg/dL — ABNORMAL HIGH (ref 65–99)
Glucose-Capillary: 128 mg/dL — ABNORMAL HIGH (ref 65–99)

## 2017-07-01 MED ORDER — QUETIAPINE FUMARATE 25 MG PO TABS
12.5000 mg | ORAL_TABLET | Freq: Once | ORAL | Status: AC
  Administered 2017-07-01: 12.5 mg via ORAL
  Filled 2017-07-01: qty 1

## 2017-07-01 MED ORDER — QUETIAPINE FUMARATE 25 MG PO TABS: 25.0000 mg | ORAL_TABLET | Freq: Every day | ORAL | Status: DC

## 2017-07-01 MED ORDER — QUETIAPINE FUMARATE 25 MG PO TABS
25.0000 mg | ORAL_TABLET | Freq: Every day | ORAL | Status: DC
  Administered 2017-07-01: 25 mg via ORAL
  Filled 2017-07-01: qty 1

## 2017-07-01 MED ORDER — LEVOTHYROXINE SODIUM 100 MCG PO TABS
100.0000 ug | ORAL_TABLET | Freq: Every day | ORAL | Status: DC
  Administered 2017-07-02 – 2017-07-03 (×2): 100 ug via ORAL
  Filled 2017-07-01 (×2): qty 1

## 2017-07-01 MED ORDER — FAMOTIDINE 20 MG PO TABS
20.0000 mg | ORAL_TABLET | Freq: Two times a day (BID) | ORAL | Status: DC
  Administered 2017-07-01 – 2017-07-02 (×2): 20 mg via ORAL
  Filled 2017-07-01 (×2): qty 1

## 2017-07-01 MED ORDER — QUETIAPINE FUMARATE 25 MG PO TABS: 25.0000 mg | ORAL_TABLET | Freq: Once | ORAL | Status: DC

## 2017-07-01 NOTE — Progress Notes (Addendum)
SLP Cancellation Note  Patient Details Name: Melinda Noble MRN: [id number] DOB: October 08, 1930   Cancelled treatment:       Reason Eval/Treat Not Completed: Patient declined, no reason specified; pt refused aphasia/dysphagia tx despite coaxing from nursing/SLP; nursing stated she "threw her tray" at lunchtime and exhibited frequent coughing during meal; may require sitter per nursing d/t refusal to stay in bed; no family available during session; ST will continue efforts.   Elvina Sidle, M.S., CCC-SLP 07/01/2017, 2:44 PM

## 2017-07-01 NOTE — Progress Notes (Signed)
Physical Therapy Treatment Patient Details Name: Melinda Noble MRN: [id number] DOB: Aug 07, 1930 Today's Date: 07/01/2017    History of Present Illness 83 y.o. female with a history of afib, not on anti-coagulants who presents with right sided weakness.Her husband states that he spoke with her while he was getting out of bed and she was normal, she then was up getting ready when he heard her fall in the bathroom, and found her on the floor with a large R periorbital hematoma, right-sided weakness, and slurred speech.  Initial NIHSS 8.  Administered tPA bolus at 0941 on 06/20/2017. taken emergently to IR and intubated and achieved a reperfusion. Extubated 06/22/17. Reintubated 9/8 due to ? pulmonary edema.     PT Comments    Pt continuing to make slow progress towards achieving her current functional mobility goals. Pt very limited secondary to cognitive deficits and becomes frustrated easily. Pt refusing to return to supine at the end of the session and required assistance from nurse to encourage her to lie back down for safety purposes. Pt would continue to benefit from skilled physical therapy services at this time while admitted and after d/c to address the below listed limitations in order to improve overall safety and independence with functional mobility.    Follow Up Recommendations  SNF     Equipment Recommendations  None recommended by PT    Recommendations for Other Services       Precautions / Restrictions Precautions Precautions: Fall Restrictions Weight Bearing Restrictions: No    Mobility  Bed Mobility Overal bed mobility: Needs Assistance Bed Mobility: Supine to Sit;Sit to Supine     Supine to sit: Mod assist;+2 for physical assistance Sit to supine: Mod assist;+2 for physical assistance   General bed mobility comments: pt requiring constant cueing for sequencing and motor planning, physical assist with LEs and elevation of trunk with use of bed pads to position hips at  EOB  Transfers Overall transfer level: Needs assistance Equipment used: 2 person hand held assist Transfers: Sit to/from Stand Sit to Stand: Max assist;+2 physical assistance         General transfer comment: increased time, bed in elevated position, max A x2 to achieve full standing position with cueing for improved posture  Ambulation/Gait                 Stairs            Wheelchair Mobility    Modified Rankin (Stroke Patients Only) Modified Rankin (Stroke Patients Only) Pre-Morbid Rankin Score: No significant disability Modified Rankin: Severe disability     Balance Overall balance assessment: Needs assistance Sitting-balance support: Feet supported Sitting balance-Leahy Scale: Fair Sitting balance - Comments: pt able to sit EOB with supervision   Standing balance support: Bilateral upper extremity supported Standing balance-Leahy Scale: Poor Standing balance comment: max A x2                            Cognition Arousal/Alertness: Awake/alert Behavior During Therapy: Flat affect Overall Cognitive Status: Impaired/Different from baseline Area of Impairment: Orientation;Attention;Following commands;Problem solving;Safety/judgement                 Orientation Level: Disoriented to;Time;Place;Situation Current Attention Level: Focused   Following Commands: Follows one step commands inconsistently Safety/Judgement: Decreased awareness of safety;Decreased awareness of deficits Awareness: Intellectual Problem Solving: Slow processing;Decreased initiation;Requires verbal cues;Requires tactile cues;Difficulty sequencing        Exercises General Exercises -  Lower Extremity Heel Slides: AROM;Strengthening;Both;5 reps;Supine Straight Leg Raises: AROM;Strengthening;Both;5 reps;Supine    General Comments        Pertinent Vitals/Pain Pain Assessment: Faces Faces Pain Scale: No hurt    Home Living                       Prior Function            PT Goals (current goals can now be found in the care plan section) Acute Rehab PT Goals PT Goal Formulation: With patient/family Time For Goal Achievement: 07/06/17 Potential to Achieve Goals: Fair Progress towards PT goals: Progressing toward goals    Frequency    Min 3X/week      PT Plan Current plan remains appropriate    Co-evaluation              AM-PAC PT "6 Clicks" Daily Activity  Outcome Measure  Difficulty turning over in bed (including adjusting bedclothes, sheets and blankets)?: Unable Difficulty moving from lying on back to sitting on the side of the bed? : Unable Difficulty sitting down on and standing up from a chair with arms (e.g., wheelchair, bedside commode, etc,.)?: Unable Help needed moving to and from a bed to chair (including a wheelchair)?: A Lot Help needed walking in hospital room?: Total Help needed climbing 3-5 steps with a railing? : Total 6 Click Score: 7    End of Session Equipment Utilized During Treatment: Gait belt Activity Tolerance: Patient limited by fatigue;Treatment limited secondary to agitation Patient left: in bed;with call bell/phone within reach;with bed alarm set;with SCD's reapplied Nurse Communication: Mobility status;Need for lift equipment PT Visit Diagnosis: Other symptoms and signs involving the nervous system (R29.898);Other abnormalities of gait and mobility (R26.89);Hemiplegia and hemiparesis Hemiplegia - Right/Left: Right Hemiplegia - dominant/non-dominant: Non-dominant Hemiplegia - caused by: Cerebral infarction     Time: 5027-7412 PT Time Calculation (min) (ACUTE ONLY): 28 min  Charges:  $Therapeutic Activity: 23-37 mins                    G Codes:       [place], PT, DPT [place] 07/01/2017, 3:28 PM

## 2017-07-01 NOTE — Consult Note (Signed)
[facility] CM Primary Care Navigator  07/01/2017  Melinda Noble [birth year] [id number]   Met with patient at the bedside but she was unable to answer questions appropriately.  RN reports that patient's husband just left the unit.  Called and spoke with husband [name]) to identify possible discharge needs.  Husband reports that they reside at [facility] where patient had fallen and "passed out" that had led to this admission.  Patient's husband endorses Dr. Henrine Screws with [facility] at [facility] as the primary care provider and no longer seeing Carrington Clamp with [facility].   Patient's husband shared using [employer] [facility] [employer] obtain medications without any problem.   Husband reports managing medicationsfor patient at home using "pill box" system filled every week.  Per husband, he provides transportation topatient's doctors'appointments.  Patient's husband will be the primary caregiver at home as stated.  Anticipated discharge plan is to skilled nursing facility back to [name] per Inpatient social worker note.  Husband voiced understanding to callprimary care provider's officewhen patient gets back home,to schedule a post discharge follow-upwithin a week or sooner if needed.Husband is aware that patient letter (with PCP's contact number) was provided at the bedside as a reminder.  Explained to husband regarding[facility] CM services available for health management but he states that patient has been well managed and "there has not been a problem so far".  Husband expressed understandingto seek referral from primary care provider to [facility] care management if deemed necessaryfor services in the future.  [facility] care management information provided for future needs that may arise.  For questions, please contact:  Dannielle Huh, BSN, RN- [facility] Primary Care Navigator  Telephone: [phone] [place]

## 2017-07-01 NOTE — Progress Notes (Addendum)
STROKE TEAM PROGRESS NOTE   SUBJECTIVE (INTERVAL HISTORY) No family is at bedside. I talked with her son, Mikki Santee, over the phone later. Pt neuro stable, but intermittent agitation, trying to get out of bed. Throwing food, not cooperate with speech. Reported frequent coughing while eating, will put pt on NPO except meds.     OBJECTIVE Temp:  [98.2 F (36.8 C)-99.1 F (37.3 C)] 98.2 F (36.8 C) (09/13 1816) Pulse Rate:  [72-79] 79 (09/13 1816) Cardiac Rhythm: Atrial fibrillation (09/13 0900) Resp:  [15-20] 20 (09/13 1816) BP: (96-152)/(58-81) 152/81 (09/13 1816) SpO2:  [94 %-98 %] 97 % (09/13 1816) Weight:  [206 lb 12.7 oz (93.8 kg)-207 lb 7.3 oz (94.1 kg)] 206 lb 12.7 oz (93.8 kg) (09/13 0525)  CBC:   Recent Labs Lab 06/27/17 0445 06/28/17 0510 06/29/17 0431  WBC 10.0 9.1 9.2  NEUTROABS 6.9 6.1  --   HGB 8.1* 8.2* 8.1*  HCT 24.9* 26.2* 26.3*  MCV 92.6 93.9 95.6  PLT 172 196 144    Basic Metabolic Panel:   Recent Labs Lab 06/28/17 0510  06/29/17 0431 06/30/17 0413  NA 142  --  140 141  K 3.5  --  3.5 3.4*  CL 107  --  107 103  CO2 30  --  29 33*  GLUCOSE 142*  --  139* 115*  BUN 34*  --  33* 20  CREATININE 0.63  --  0.62 0.69  CALCIUM 9.1  --  9.3 9.5  MG 1.8  --   --  1.6*  PHOS 1.6*  < > 2.1* 2.8  < > = values in this interval not displayed.  Lipid Panel:     Component Value Date/Time   CHOL 133 06/21/2017 0349   CHOL 152 01/26/2017 0855   TRIG 99 06/26/2017 0949   HDL 45 06/21/2017 0349   HDL 56 01/26/2017 0855   CHOLHDL 3.0 06/21/2017 0349   VLDL 24 06/21/2017 0349   LDLCALC 64 06/21/2017 0349   LDLCALC 78 01/26/2017 0855   HgbA1c:  Lab Results  Component Value Date   HGBA1C 5.4 06/21/2017   Urine Drug Screen:     Component Value Date/Time   LABOPIA NONE DETECTED 06/20/2017 2201   COCAINSCRNUR NONE DETECTED 06/20/2017 2201   LABBENZ POSITIVE (A) 06/20/2017 2201   AMPHETMU NONE DETECTED 06/20/2017 2201   THCU NONE DETECTED 06/20/2017 2201   LABBARB NONE DETECTED 06/20/2017 2201    Alcohol Level No results found for: [place] I have personally reviewed the radiological images below and agree with the radiology interpretations.  Ct Head Code Stroke Wo Contrast 06/20/2017 IMPRESSION:  1. No significant interval change.  2. Stable atrophy and white matter disease.  3. No definite hyperdense vessel.  4. Stable right periorbital hematoma.  5. ASPECTS is 10/10   06/20/2017 IMPRESSION:  1. Stable atrophy and white matter disease.  2. No acute intracranial abnormality or significant interval change.  3. Prominent right infraorbital and lateral soft tissue hematoma without underlying fracture or intraorbital extension.  4. ASPECTS is 10/10  06/20/2017 IMPRESSION:  1. Focal density posteriorly in the left sylvian fissure may reflect contrast associated with a more distal occlusion.  2. No acute hemorrhage.  3. No evidence for progressing infarct.  4. Stable atrophy and white matter disease.  5. Increasing size of right infraorbital hematoma.   06/21/2017 1. New left posterolateral temporal lobe acute infarction.  2. Stable infarction centered in left frontal centrum semiovale extending into left superior  lentiform nucleus and insula in comparison with prior CT/MRI head. Interval increase in edema and local mass effect associated with the infarct.  3. No acute intracranial hemorrhage.   Ct Maxillofacial Wo Contrast 06/20/2017 IMPRESSION:  1. No maxillofacial fracture.  2. Right infraorbital and periorbital soft tissue hematoma.   [name] Head Wo Contrast 06/20/2017 IMPRESSION:  1. High-grade stenosis or occlusion of the distal left M1 segment with nonvisualization of distal MCA branch vessels. This corresponds with the patient's acute symptoms, likely representing an emergent large vessel occlusion.  2. Narrowing of the right M1 segment and attenuation of MCA branch vessels likely related to atherosclerotic disease.  3.  Posterior circulation is intact.  4. Ill-defined area of restricted diffusion within white matter of the left centrum semi ovale may reflect acute ischemia. No definite cortical infarct is present.  Mr Brain Wo Contrast 06/21/2017 IMPRESSION:  1. Increased size of acute/subacute left MCA territory infarct, now involving the left insular cortex, left frontal operculum, posterior left temporal and parietal lobe, and more anterior superior left frontal lobe.  2. No evidence for hemorrhagic conversion of left-sided infarcts.  3. Focal acute nonhemorrhagic infarct in the right parietal lobe and centrum semiovale. Bilateral disease raises concern for a central cardio embolic source.  4. Right periorbital hematoma.   TTE 06/20/2017  Left ventricle: The cavity size was normal. Wall thickness was   normal. Systolic function was mildly reduced. The estimated   ejection fraction was in the range of 45% to 50%. There is   akinesis of the apical myocardium. Doppler parameters are   consistent with abnormal left ventricular relaxation (grade 1   diastolic dysfunction).   LE arterial doppler - Pseudoaneurysm of the right groin measuring 4.68 x 4.11 cm with surrounding hematoma. Pseudo neck measures 0.58 cm and is 1.36 cm long.  UE venous doppler - There is no DVT or SVT noted in the bilateral upper extremities.     PHYSICAL EXAM Vitals:   07/01/17 0500 07/01/17 0525 07/01/17 0859 07/01/17 1816  BP:  126/69 118/64 (!) 152/81  Pulse:  74 79 79  Resp:  16 18 20   Temp:  99 F (37.2 C) 99.1 F (37.3 C) 98.2 F (36.8 C)  TempSrc:  Oral Oral Axillary  SpO2:  97% 98% 97%  Weight: 206 lb 12.7 oz (93.8 kg) 206 lb 12.7 oz (93.8 kg)    Height:        Temp:  [98.2 F (36.8 C)-99.1 F (37.3 C)] 98.2 F (36.8 C) (09/13 1816) Pulse Rate:  [72-79] 79 (09/13 1816) Resp:  [15-20] 20 (09/13 1816) BP: (96-152)/(58-81) 152/81 (09/13 1816) SpO2:  [94 %-98 %] 97 % (09/13 1816) Weight:  [206 lb 12.7 oz (93.8  kg)-207 lb 7.3 oz (94.1 kg)] 206 lb 12.7 oz (93.8 kg) (09/13 0525)  General - Well nourished, well developed, extubated, not in distress, right facial ecchymoses   Ophthalmologic - Fundi not visualized due to noncooperation.  Cardiovascular - Regular rate and rhythm.  Neuro - awake, alert, eyes open, interactive, partial global aphasia, able to follow very limited commands such as "close eyes", able to say very limited words including her name and "thank you". Eyes attending to both sides, PERRL, EOMI, blinking to visual threat bilaterally. Facial symmetry, tongue in middle in mouth. BUE 5/5, no drift. BLE 4/5. DTR 1+ and no babinski. Sensation symmetical, coordination not cooperative and gait not tested.   ASSESSMENT/PLAN Ms. CARIGAN LISTER is a 83 y.o. female with history  of history of PAF not on AC, HTN, CAD/MI s/p stent, CHF, HLD who presents with right sided weakness. She is status post IV tPA 0941 on 06/20/2017 and IR mechanical thrombectomy.  Stroke: acute/subacute L MCA infarcts and right MCA punctate infarcts, embolic, likely due to PAF not on AC, s/p tPA and mechanical thrombectomy of left MCA with TICI 2b reperfusion  Resultant intubated  CT head: no acute stroke.  Prominent right infraorbital and lateral soft tissue hematoma without underlying fracture or intraorbital extension  MRI head: acute/subacute L MCA infarct, involving the left insular cortex, left frontal operculum, posterior left temporal and parietal lobe, and more anterior superior left frontal lobe.  Focal acute nonhemorrhagic infarct in the right parietal lobe and centrum semiovale.  MRA head: High-grade L M1 stenosis.  Ill-defined area of restricted diffusion within white matter of the left centrum semi ovale may reflect acute ischemia  IR / DSA - left M1 mechanical thrombectomy with TICI2b reperfusion  TTE EF 45% to 50%  UE venous doppler - no DVT  LDL 64  HgbA1c 5.4  SCDs for VTE prophylaxis Diet NPO time  specified Except for: Sips with Meds  aspirin 81 mg daily prior to admission, now on aspirin 81mg  and plavix 75mg  daily. will consider AC if no procedure planned  Patient counseled to be compliant with her antithrombotic medications  Ongoing aggressive stroke risk factor management  Therapy recommendations:   SNF  Disposition:  Pending  PAF not on AC  AF pre-MI but not sure about PAF after MI but on amiodarone   Follows with Dr. Tamala Julian in cardiology  On ASA now  Consider [facility] if no procedure planned.  Agitation   Could be depression from aphasia  Could be cognitive impairment from stroke  On zoloft home dose  Will add seroquel 25mg  Qhs  Right femoral pseudoaneurysm  S/p emergency repair 06/23/17  Right groin JP drain d/c'ed  Follow up with [facility] as outpt  Hypotension/hypertension  BP fluctuates Off pressor Long-term BP goal normotensive  Anemia, acute blood loss  Hb 9.4->6.1->7.8->8.9->9.3->8.1->8.2->8.1  Close CBC monitoring  Continue ASA and plavix  Dysphagia   Did not pass swallow before re-intubation  Silent aspiration before re-intubation  Speech on board  Pt pulled NG tube after extubation  Currently Dysphagia 1 (Puree) solids;Honey thick liquids - however, frequent coughing  Keep NPO for safety  Hyperlipidemia  Home meds: Rosuvastatin 5 mg daily  LDL 64, goal < 70  On crestor 10mg  now  Continue statin at discharge  Other Stroke Risk Factors  Advanced age  Obesity, Body mass index is 34.41 kg/m., recommend weight loss, diet and exercise as appropriate   CAD/MI s/p stent - on ASA now  Other Active Problems  Leukocytosis, resolved 7.1 - 13.0 - 9.1->9.2  aphasia  Hospital day # 11  Rosalin Hawking, MD PhD Stroke Neurology 07/01/2017 8:28 PM   To contact Stroke Continuity provider, please refer to [email or web address] After hours, contact General Neurology

## 2017-07-02 DIAGNOSIS — D62 Acute posthemorrhagic anemia: Secondary | ICD-10-CM

## 2017-07-02 LAB — BASIC METABOLIC PANEL
Anion gap: 8 (ref 5–15)
BUN: 29 mg/dL — AB (ref 6–20)
CALCIUM: 10.1 mg/dL (ref 8.9–10.3)
CO2: 30 mmol/L (ref 22–32)
CREATININE: 0.82 mg/dL (ref 0.44–1.00)
Chloride: 103 mmol/L (ref 101–111)
GFR calc Af Amer: >60 mL/min (ref >60)
Glucose, Bld: 146 mg/dL — ABNORMAL HIGH (ref 65–99)
POTASSIUM: 3 mmol/L — AB (ref 3.5–5.1)
SODIUM: 141 mmol/L (ref 135–145)

## 2017-07-02 LAB — GLUCOSE, CAPILLARY
GLUCOSE-CAPILLARY: 136 mg/dL — AB (ref 65–99)
Glucose-Capillary: 166 mg/dL — ABNORMAL HIGH (ref 65–99)
Glucose-Capillary: 178 mg/dL — ABNORMAL HIGH (ref 65–99)
Glucose-Capillary: 129 mg/dL — ABNORMAL HIGH (ref 65–99)

## 2017-07-02 LAB — CBC
HCT: 35.1 % — ABNORMAL LOW (ref 36.0–46.0)
Hemoglobin: 11 g/dL — ABNORMAL LOW (ref 12.0–15.0)
MCH: 29.7 pg (ref 26.0–34.0)
MCHC: 31.3 g/dL (ref 30.0–36.0)
MCV: 94.9 fL (ref 78.0–100.0)
Platelets: 256 10*3/uL (ref 150–400)
RBC: 3.7 MIL/uL — ABNORMAL LOW (ref 3.87–5.11)
RDW: 16.3 % — AB (ref 11.5–15.5)
WBC: 9.4 10*3/uL (ref 4.0–10.5)

## 2017-07-02 MED ORDER — APIXABAN 5 MG PO TABS
5.0000 mg | ORAL_TABLET | Freq: Two times a day (BID) | ORAL | Status: DC
  Administered 2017-07-02 – 2017-07-03 (×2): 5 mg via ORAL
  Filled 2017-07-02 (×2): qty 1

## 2017-07-02 MED ORDER — POTASSIUM CHLORIDE CRYS ER 20 MEQ PO TBCR: 40.0000 meq | EXTENDED_RELEASE_TABLET | Freq: Once | ORAL | Status: DC

## 2017-07-02 MED ORDER — APIXABAN 5 MG PO TABS: 5.0000 mg | ORAL_TABLET | Freq: Two times a day (BID) | ORAL | 0 refills | Status: AC

## 2017-07-02 MED ORDER — ALPRAZOLAM 0.25 MG PO TABS: ORAL_TABLET | ORAL | 0 refills | Status: DC

## 2017-07-02 MED ORDER — FUROSEMIDE 20 MG PO TABS: 10.0000 mg | ORAL_TABLET | Freq: Every day | ORAL | 2 refills | Status: DC

## 2017-07-02 MED ORDER — QUETIAPINE FUMARATE 25 MG PO TABS
12.5000 mg | ORAL_TABLET | Freq: Two times a day (BID) | ORAL | Status: DC | PRN
  Administered 2017-07-02 – 2017-07-03 (×2): 12.5 mg via ORAL
  Filled 2017-07-02 (×2): qty 1

## 2017-07-02 MED ORDER — ASPIRIN EC 81 MG PO TBEC
81.0000 mg | DELAYED_RELEASE_TABLET | Freq: Every day | ORAL | Status: DC
  Administered 2017-07-03: 81 mg via ORAL
  Filled 2017-07-02: qty 1

## 2017-07-02 MED ORDER — ROSUVASTATIN CALCIUM 10 MG PO TABS: 10.0000 mg | ORAL_TABLET | ORAL | 2 refills | Status: AC

## 2017-07-02 MED ORDER — QUETIAPINE FUMARATE 25 MG PO TABS
12.5000 mg | ORAL_TABLET | Freq: Two times a day (BID) | ORAL | Status: DC | PRN
  Administered 2017-07-02: 12.5 mg via ORAL
  Filled 2017-07-02: qty 1

## 2017-07-02 MED ORDER — SYNTHROID 100 MCG PO TABS: ORAL_TABLET | ORAL | 1 refills | Status: AC

## 2017-07-02 NOTE — Progress Notes (Signed)
ANTICOAGULATION CONSULT NOTE - Initial Consult  Pharmacy Consult for apixaban Indication: atrial fibrillation  Allergies  Allergen Reactions  . Ace Inhibitors Anaphylaxis  . Beta Adrenergic Blockers Anaphylaxis  . Iohexol Anaphylaxis     Code: HIVES, Desc: anaphylactic shock s/p contrast injection many yrs ago--suggested that pt NEVER have iv contrast//a.c., Onset Date: [id number]   . Atorvastatin Rash  . Latex Rash  . Penicillins Swelling and Rash    Details are unknown  . Sulfa Antibiotics Rash    Patient Measurements: Height: 5\' 5"  (165.1 cm) Weight: 205 lb 14.6 oz (93.4 kg) IBW/kg (Calculated) : 57  Vital Signs: Temp: 98.9 F (37.2 C) (09/14 0944) Temp Source: Oral (09/14 0944) BP: 125/57 (09/14 0944) Pulse Rate: 66 (09/14 0944)  Labs:  Recent Labs  06/30/17 0413 07/02/17 0651  HGB  --  11.0*  HCT  --  35.1*  PLT  --  256  CREATININE 0.69 0.82    Estimated Creatinine Clearance: 57.7 mL/min (by C-G formula based on SCr of 0.82 mg/dL).   Medical History: Past Medical History:  Diagnosis Date  . Anemia   . CAD (coronary artery disease)    a. STEMI 10/09/13 - 100% LAD - following initial angioplasty, ? Spontaneous dissection vs related to ballon. s/p complex PCI with 2 overlapping DES to LAD. Adm complicated by VDRF, cardiogenic shock, peri-MI PAF with NSR on amiodarone. Residual mod RCA disease.   Marland Kitchen CAD S/P percutaneous coronary angioplasty 10/09/13   DES x 2 to Mid LAD, 2.5 x 38, 2.5 x 12 Promus (covering extensive disection), moderate RCA disesase  . Cardiogenic shock ([place])    a. 09/2013 - during admission for STEMI.  . Depression   . Dyslipidemia   . H/O Acute respiratory failure due to hypoxia 10/10/2013   a. 09/2013 - secondary to anterior STEMI related pulmonary edema (resolved).  . Hypertension   . Hypothyroidism   . Ischemic cardiomyopathy    a. EF 30-35% during 09/2013 adm for STEMI. b. Repeat Echo EF 40-45% with distal LAD hypokinesis (10/16/14  following STEMI recovery).  . Migraine headache    a. Remotely.  . Orthostatic hypotension    a. Adm 10/2013 for this, felt due to Lasix. Not on regular diuretic due to this/dehydration.  Marland Kitchen PAF (paroxysmal atrial fibrillation) ([place])    a. 09/2013 - peri-MI, NSR at discharge on amiodarone.  . Sinus bradycardia    Assessment: 22 yof presented with R sided weakness and received IV tPA and went to IR. Now starting apixaban for stroke prevention with afib. H/H slightly low and platelets are WNL. Pt with good renal fxn.   Goal of Therapy:  Therapeutic anticoagulation Monitor platelets by anticoagulation protocol: Yes   Plan:  Apixaban 5mg  PO BID F/u S&S of bleeding, renal fxn  Dorothy Landgrebe, Rande Lawman 07/02/2017,12:33 PM

## 2017-07-02 NOTE — Progress Notes (Signed)
  Speech Language Pathology Treatment: Dysphagia  Patient Details Name: Melinda Noble MRN: [id number] DOB: 12-25-1929 Today's Date: 07/02/2017 Time: 9794-8016 SLP Time Calculation (min) (ACUTE ONLY): 20 min  Assessment / Plan / Recommendation Clinical Impression  Treatment focused on dysphagia for re-initiation of po's  Yesterday pt made NPO due to coughing with meds with nursing. Pt stated "let's go", periodically swinging legs over bed rail but redirectable with multilodal cues. She accepted puree and honey thick liquids with 5-15 second oral holding. No pharyngeal indications of aspiration over multiple trials. SLP called husband with updates (as requested), answered questions and called daughter (per husband's request) with results and recommendations. Recommend continue Dys 1, honey thick liquids, crush meds, self feeding, no straws, allow extra time for oral propulsion and ST at [facility].   HPI HPI: 83 year old female w/ sig h/o atrial fib (not on systemic A/C). Presented to the ER on 9/2 w/ new (R) sided hemiplegia.  Dx  left MCA CVA, S/P endovascularization in IR.  ETT 9/2-9/4.   MRI: acute/subacute L MCA infarct, involving L insular cortex, L frontal operculum, L posterior temporal and parietal lobe, and more anterior L superior frontal lobe, as well as acute infarct R parietal lobe and Rcentrum semiovale, embolic, in the setting of atheorsclerosis. Pt subsequently with right groin hematoma; to OR 9/5 for repair femoral artery pseudoaneurysm; ETT 9/5-9/6.  Had MBS 9/7, which revealed aspiration of purees, honey, and nectar-thick liquids with non-protective, delayed cough.  Recs were made to continue NPO,  place cortrak.  Pt went into respiratory distress and was reintubated 9/8-9/11.      SLP Plan  Continue with current plan of care       Recommendations  Diet recommendations: Dysphagia 1 (puree);Honey-thick liquid Liquids provided via: Cup;No straw Medication Administration:  Crushed with puree Supervision: Patient able to self feed;Full supervision/cueing for compensatory strategies Compensations: Slow rate;Small sips/bites;Minimize environmental distractions;Lingual sweep for clearance of pocketing Postural Changes and/or Swallow Maneuvers: Seated upright 90 degrees                Oral Care Recommendations: Oral care BID Follow up Recommendations: Skilled Nursing facility SLP Visit Diagnosis: Dysphagia, oropharyngeal phase (R13.12) Plan: Continue with current plan of care                       Houston Siren 07/02/2017, 12:35 PM  Orbie Pyo Colvin Caroli.[employer] [phone]
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
## 2017-07-02 NOTE — Discharge Summary (Addendum)
Stroke Discharge Summary   Patient ID: Melinda Noble   MRN: [id number]      DOB: [birth year]  Date of Admission: 06/20/2017 Date of Discharge: 07/03/2017  Attending Physician:  Rosalin Hawking, MD, Stroke MD Consultant(s):   Treatment Team:  Stroke, Md, MD pulmonary/intensive care Patient's PCP:  Gayland Curry, DO  DISCHARGE DIAGNOSIS: Principal Problem:   Acute ischemic stroke ([place]) - acute/subacute L MCA infarcts and right MCA punctate infarcts, embolic, likely due to PAF not on AC, s/p tPA and mechanical thrombectomy of left MCA with TICI 2b reperfus   PAF now on eliqius Active Problems:   Pseudoaneurysm of femoral artery (HCC)   Anemia, acute blood loss   Dysphagia   HLD   Leukocytosis   Obesity   aphasia    Past Medical History:  Diagnosis Date  . Anemia   . CAD (coronary artery disease)    a. STEMI 10/09/13 - 100% LAD - following initial angioplasty, ? Spontaneous dissection vs related to ballon. s/p complex PCI with 2 overlapping DES to LAD. Adm complicated by VDRF, cardiogenic shock, peri-MI PAF with NSR on amiodarone. Residual mod RCA disease.   Marland Kitchen CAD S/P percutaneous coronary angioplasty 10/09/13   DES x 2 to Mid LAD, 2.5 x 38, 2.5 x 12 Promus (covering extensive disection), moderate RCA disesase  . Cardiogenic shock ([place])    a. 09/2013 - during admission for STEMI.  . Depression   . Dyslipidemia   . H/O Acute respiratory failure due to hypoxia 10/10/2013   a. 09/2013 - secondary to anterior STEMI related pulmonary edema (resolved).  . Hypertension   . Hypothyroidism   . Ischemic cardiomyopathy    a. EF 30-35% during 09/2013 adm for STEMI. b. Repeat Echo EF 40-45% with distal LAD hypokinesis (10/16/14 following STEMI recovery).  . Migraine headache    a. Remotely.  . Orthostatic hypotension    a. Adm 10/2013 for this, felt due to Lasix. Not on regular diuretic due to this/dehydration.  Marland Kitchen PAF (paroxysmal atrial fibrillation) ([place])    a. 09/2013 - peri-MI, NSR at  discharge on amiodarone.  . Sinus bradycardia    Past Surgical History:  Procedure Laterality Date  . CORONARY ANGIOPLASTY WITH STENT PLACEMENT  10/09/13   2 overlapping Promus DES to mid LAD, occlusion/dissection (2.5 x 38, 2.5 x 12)  . FEMORAL ARTERY EXPLORATION Right 06/23/2017   Procedure: repair right femoral pseudoanerysm; evacuation hemotoma;  Surgeon: Elam Dutch, MD;  Location: [facility] OR;  Service: Vascular;  Laterality: Right;  . IR ANGIO INTRA EXTRACRAN SEL COM CAROTID INNOMINATE UNI R MOD SED  06/20/2017  . IR PERCUTANEOUS ART THROMBECTOMY/INFUSION INTRACRANIAL INC DIAG ANGIO  06/20/2017  . IR US GUIDE BX ASP/DRAIN  06/23/2017  . LEFT HEART CATHETERIZATION WITH CORONARY ANGIOGRAM N/A 10/09/2013   Procedure: LEFT HEART CATHETERIZATION WITH CORONARY ANGIOGRAM;  Surgeon: Leonie Man, MD;  Location: [facility] CATH LAB;  Service: Cardiovascular;  Laterality: N/A;  . RADIOLOGY WITH ANESTHESIA N/A 06/20/2017   Procedure: RADIOLOGY WITH ANESTHESIA;  Surgeon: Luanne Bras, MD;  Location: [place];  Service: Radiology;  Laterality: N/A;  . SHOULDER SURGERY      Allergies as of 07/02/2017      Reactions   Ace Inhibitors Anaphylaxis   Beta Adrenergic Blockers Anaphylaxis   Iohexol Anaphylaxis    Code: HIVES, Desc: anaphylactic shock s/p contrast injection many yrs ago--suggested that pt NEVER have iv contrast//a.c., Onset Date: [id number]   Atorvastatin Rash  Latex Rash   Penicillins Swelling, Rash   Details are unknown   Sulfa Antibiotics Rash      Medication List    STOP taking these medications   clopidogrel 75 MG tablet Commonly known as:  PLAVIX   losartan 100 MG tablet Commonly known as:  COZAAR     TAKE these medications   acetaminophen 325 MG tablet Commonly known as:  TYLENOL Take 650 mg by mouth every 6 (six) hours as needed for mild pain.   ALPRAZolam 0.25 MG tablet Commonly known as:  XANAX TAKE HALF TABLET (0.125mg ) TWICE DAILY AS NEEDED FOR ANXIETY.   apixaban 5  MG Tabs tablet Commonly known as:  ELIQUIS Take 1 tablet (5 mg total) by mouth 2 (two) times daily.   aspirin 81 MG chewable tablet Chew 1 tablet (81 mg total) by mouth daily.   Biotin 1000 MCG tablet Take 1,000 mcg by mouth daily.   buPROPion 150 MG 12 hr tablet Commonly known as:  WELLBUTRIN SR Take 150 mg by mouth every morning.   calcium-vitamin D 500-200 MG-UNIT tablet Commonly known as:  OSCAL WITH D Take 1 tablet by mouth daily.   carvedilol 6.25 MG tablet Commonly known as:  COREG Take 1 tablet (6.25 mg total) by mouth 2 (two) times daily with a meal.   cholecalciferol 1000 units tablet Commonly known as:  VITAMIN D Take 1,000 Units by mouth daily.   clobetasol ointment 0.05 % Commonly known as:  TEMOVATE Apply 1 application topically 2 (two) times daily as needed. (affected areas)--also thighs   fexofenadine 60 MG tablet Commonly known as:  ALLEGRA Take 60 mg by mouth 2 (two) times daily as needed for allergies or rhinitis.   furosemide 20 MG tablet Commonly known as:  LASIX Take 0.5 tablets (10 mg total) by mouth daily. What changed:  See the new instructions.   montelukast 10 MG tablet Commonly known as:  SINGULAIR Take 10 mg by mouth at bedtime.   multivitamin with minerals Tabs tablet Take 1 tablet by mouth daily.   nitroGLYCERIN 0.4 MG SL tablet Commonly known as:  NITROSTAT Place 0.4 mg under the tongue every 5 (five) minutes as needed for chest pain.   omeprazole 20 MG capsule Commonly known as:  PRILOSEC Take 20 mg by mouth daily.   rosuvastatin 10 MG tablet Commonly known as:  CRESTOR Take 1 tablet (10 mg total) by mouth 3 (three) times a week. (Mondays, Wednesdays, and Fridays) What changed:  medication strength   sertraline 100 MG tablet Commonly known as:  ZOLOFT Take 100 mg by mouth daily.   SYNTHROID 100 MCG tablet Generic drug:  levothyroxine TAKE 1 TABLET (133mcg) IN THE MORNING ON AN EMPTY STOMACH.   SYSTANE OP Apply 1 drop  to eye daily as needed (dry eyes).   vitamin E 400 UNIT capsule Generic drug:  vitamin E Take 400 Units by mouth daily.            Discharge Care Instructions        Start     Ordered   07/02/17 0000  ALPRAZolam (XANAX) 0.25 MG tablet     07/02/17 1616   07/02/17 0000  apixaban (ELIQUIS) 5 MG TABS tablet  2 times daily     07/02/17 1616   07/02/17 0000  furosemide (LASIX) 20 MG tablet  Daily     07/02/17 1616   07/02/17 0000  SYNTHROID 100 MCG tablet     07/02/17 1616   07/02/17  0000  Ambulatory referral to Neurology    Comments:  An appointment is requested in approximately: 6 -8 weeks   07/02/17 1616   07/02/17 0000  rosuvastatin (CRESTOR) 10 MG tablet  3 times weekly     07/02/17 1713      LABORATORY STUDIES CBC    Component Value Date/Time   WBC 9.4 07/02/2017 0651   RBC 3.70 (L) 07/02/2017 0651   HGB 11.0 (L) 07/02/2017 0651   HCT 35.1 (L) 07/02/2017 0651   PLT 256 07/02/2017 0651   MCV 94.9 07/02/2017 0651   MCH 29.7 07/02/2017 0651   MCHC 31.3 07/02/2017 0651   RDW 16.3 (H) 07/02/2017 0651   LYMPHSABS 1.6 06/28/2017 0510   MONOABS 1.1 (H) 06/28/2017 0510   EOSABS 0.3 06/28/2017 0510   BASOSABS 0.0 06/28/2017 0510   CMP    Component Value Date/Time   NA 141 07/02/2017 0651   NA 140 01/26/2017 0855   K 3.0 (L) 07/02/2017 0651   CL 103 07/02/2017 0651   CO2 30 07/02/2017 0651   GLUCOSE 146 (H) 07/02/2017 0651   BUN 29 (H) 07/02/2017 0651   BUN 17 01/26/2017 0855   CREATININE 0.82 07/02/2017 0651   CALCIUM 10.1 07/02/2017 0651   PROT 6.7 06/20/2017 0819   PROT 6.7 01/26/2017 0855   ALBUMIN 2.7 (L) 06/24/2017 0410   ALBUMIN 4.2 01/26/2017 0855   AST 26 06/20/2017 0819   ALT 23 06/20/2017 0819   ALKPHOS 72 06/20/2017 0819   BILITOT 0.6 06/20/2017 0819   BILITOT 0.5 01/26/2017 0855   GFRNONAA >60 07/02/2017 0651   GFRAA >60 07/02/2017 0651   COAGS Lab Results  Component Value Date   INR 1.27 06/23/2017   INR 0.94 06/20/2017   INR 1.03  11/25/2013   Lipid Panel    Component Value Date/Time   CHOL 133 06/21/2017 0349   CHOL 152 01/26/2017 0855   TRIG 99 06/26/2017 0949   HDL 45 06/21/2017 0349   HDL 56 01/26/2017 0855   CHOLHDL 3.0 06/21/2017 0349   VLDL 24 06/21/2017 0349   LDLCALC 64 06/21/2017 0349   LDLCALC 78 01/26/2017 0855   HgbA1C  Lab Results  Component Value Date   HGBA1C 5.4 06/21/2017   Urinalysis    Component Value Date/Time   COLORURINE YELLOW 06/20/2017 2201   APPEARANCEUR CLEAR 06/20/2017 2201   LABSPEC 1.032 (H) 06/20/2017 2201   PHURINE 5.0 06/20/2017 2201   GLUCOSEU NEGATIVE 06/20/2017 2201   HGBUR NEGATIVE 06/20/2017 2201   BILIRUBINUR NEGATIVE 06/20/2017 2201   KETONESUR NEGATIVE 06/20/2017 2201   PROTEINUR NEGATIVE 06/20/2017 2201   UROBILINOGEN 1.0 10/24/2013 1344   NITRITE NEGATIVE 06/20/2017 2201   LEUKOCYTESUR TRACE (A) 06/20/2017 2201   Urine Drug Screen     Component Value Date/Time   LABOPIA NONE DETECTED 06/20/2017 2201   COCAINSCRNUR NONE DETECTED 06/20/2017 2201   LABBENZ POSITIVE (A) 06/20/2017 2201   AMPHETMU NONE DETECTED 06/20/2017 2201   THCU NONE DETECTED 06/20/2017 2201   LABBARB NONE DETECTED 06/20/2017 2201    Alcohol Level No results found for: ETH   SIGNIFICANT DIAGNOSTIC STUDIES Ct Head Code Stroke Wo Contrast 06/20/2017 IMPRESSION:  1. No significant interval change.  2. Stable atrophy and white matter disease.  3. No definite hyperdense vessel.  4. Stable right periorbital hematoma.  5. ASPECTS is 10/10   06/20/2017 IMPRESSION:  1. Stable atrophy and white matter disease.  2. No acute intracranial abnormality or significant interval change.  3. Prominent  right infraorbital and lateral soft tissue hematoma without underlying fracture or intraorbital extension.  4. ASPECTS is 10/10  06/20/2017 IMPRESSION:  1. Focal density posteriorly in the left sylvian fissure may reflect contrast associated with a more distal occlusion.  2. No acute  hemorrhage.  3. No evidence for progressing infarct.  4. Stable atrophy and white matter disease.  5. Increasing size of right infraorbital hematoma.   06/21/2017 1. New left posterolateral temporal lobe acute infarction.  2. Stable infarction centered in left frontal centrum semiovale extending into left superior lentiform nucleus and insula in comparison with prior CT/MRI head. Interval increase in edema and local mass effect associated with the infarct.  3. No acute intracranial hemorrhage.   Ct Maxillofacial Wo Contrast 06/20/2017 IMPRESSION:  1. No maxillofacial fracture.  2. Right infraorbital and periorbital soft tissue hematoma.   [name] Head Wo Contrast 06/20/2017 IMPRESSION:  1. High-grade stenosis or occlusion of the distal left M1 segment with nonvisualization of distal MCA branch vessels. This corresponds with the patient's acute symptoms, likely representing an emergent large vessel occlusion.  2. Narrowing of the right M1 segment and attenuation of MCA branch vessels likely related to atherosclerotic disease.  3. Posterior circulation is intact.  4. Ill-defined area of restricted diffusion within white matter of the left centrum semi ovale may reflect acute ischemia. No definite cortical infarct is present.  Mr Brain Wo Contrast 06/21/2017 IMPRESSION:  1. Increased size of acute/subacute left MCA territory infarct, now involving the left insular cortex, left frontal operculum, posterior left temporal and parietal lobe, and more anterior superior left frontal lobe.  2. No evidence for hemorrhagic conversion of left-sided infarcts.  3. Focal acute nonhemorrhagic infarct in the right parietal lobe and centrum semiovale. Bilateral disease raises concern for a central cardio embolic source.  4. Right periorbital hematoma.   TTE 06/20/2017  Left ventricle: The cavity size was normal. Wall thickness was normal. Systolic function was mildly reduced. The estimated ejection  fraction was in the range of 45% to 50%. There is akinesis of the apical myocardium. Doppler parameters are consistent with abnormal left ventricular relaxation (grade 1 diastolic dysfunction).   LE arterial doppler - Pseudoaneurysm of the right groin measuring 4.68 x 4.11 cm with surrounding hematoma. Pseudo neck measures 0.58 cm and is 1.36 cm long.  UE venous doppler - There is no DVT or SVT noted in the bilateral upper extremities.      HISTORY OF PRESENT ILLNESS Melinda Noble is a 83 y.o. female with a  History of afib, not on anti-coagulants who presents with right sided weakness. She was initially plegic on the right, but by the time of arrival to the ED, was having significant improvement.   Her husband states that he spoke with her while he was getting gout of bed and she was normal, she then was up getting ready when he heard her fall.   LKW: 6:30 am .  tpa given?: yes Modified Rankin Score: 0 VAN: positive  ROS: A 14 point ROS was performed and is negative except as noted in the HPI.     HOSPITAL COURSE Melinda Noble is a 83 y.o. female with history of history of PAF not on AC, HTN, CAD/MI s/p stent, CHF, HLD who presents with right sided weakness. She is status post IV tPA 0941 on 06/20/2017 and IR mechanical thrombectomy.  Stroke: acute/subacute L MCA infarcts and right MCA punctate infarcts, embolic, likely due to PAF not on AC,  s/p tPA and mechanical thrombectomy of left MCA with TICI 2b reperfusion  Resultant intubated  CT head: no acute stroke.  Prominent right infraorbital and lateral soft tissue hematoma without underlying fracture or intraorbital extension  MRI head: acute/subacute L MCA infarct, involving the left insular cortex, left frontal operculum, posterior left temporal and parietal lobe, and more anterior superior left frontal lobe.  Focal acute nonhemorrhagic infarct in the right parietal lobe and centrum semiovale.  MRA head: High-grade  L M1 stenosis.  Ill-defined area of restricted diffusion within white matter of the left centrum semi ovale may reflect acute ischemia  IR / DSA - left M1 mechanical thrombectomy with TICI2b reperfusion  TTE EF 45% to 50%  UE venous doppler - no DVT  LDL 64  HgbA1c 5.4  SCDs for VTE prophylaxis  DIET - DYS 1 Room service appropriate? Yes; Fluid consistency: Honey Thick  aspirin 81 mg and plavix daily prior to admission, now stopped Plavix 75 mg daily, started Eliquis 5 mg BID, continue ASA 81 mg PO daily  Patient counseled to be compliant with her antithrombotic medications  Ongoing aggressive stroke risk factor management  Therapy recommendations:   SNF ([facility])  Disposition:  Pending  PAF not on AC  AF pre-MI but not sure about PAF after MI but on amiodarone   Follows with Dr. Tamala Julian in cardiology  Started on Eliquis 5 mg BID 12 days after stroke 07/02/17  Agitation   Could be depression from aphasia  Could be cognitive impairment from stroke  On zoloft home dose  Given seroquel 12.5 mg PO BID PRN while in hospital  Right femoral pseudoaneurysm  S/p emergency repair 06/23/17  Right groin JP drain d/c'ed  Follow up with [facility] as outpt  Hypotension/hypertension  BP fluctuates  Off pressor  Long-term BP goal normotensive  Anemia, acute blood loss with procedure  Hb 9.4->6.1->7.8->8.9->9.3->8.1->8.2->8.1->11.0  Close CBC monitoring  Continue Eliquis 5 mg PO BID, ASA 81 mg PO daily  Dysphagia   Did not pass swallow before re-intubation  Silent aspiration before re-intubation  Speech on board  Pt pulled NG tube after extubation  Currently Dysphagia 1 (Puree) solids;Honey thick liquids - however, frequent coughing  Hyperlipidemia  Home meds: Rosuvastatin 5 mg daily  LDL 64, goal < 70  On crestor 10mg  now  Continue statin at discharge  Other Stroke Risk Factors  Advanced age  Obesity, Body mass index  is 34.27 kg/m., recommend weight loss, diet and exercise as appropriate   CAD/MI s/p stent - continue ASA   Other Active Problems  Leukocytosis, resolved 7.1 - 13.0 - 9.1->9.2  aphasia  DISCHARGE EXAM Blood pressure 133/62, pulse 87, temperature 98.9 F (37.2 C), temperature source Axillary, resp. rate 20, height 5\' 5"  (1.651 m), weight 205 lb 14.6 oz (93.4 kg), SpO2 98 %. General - Well nourished, well developed, extubated, not in distress, right facial ecchymoses   Ophthalmologic - Fundi not visualized due to noncooperation.  Cardiovascular - Regular rate and rhythm.  Neuro - awake, alert, eyes open, interactive, partial global aphasia, able to follow very limited commands such as "close eyes", able to say very limited words including her name and "thank you". Eyes attending to both sides, PERRL, EOMI, blinking to visual threat bilaterally. Facial symmetry, tongue in middle in mouth. BUE 5/5, no drift. BLE 4/5. DTR 1+ and no babinski. Sensation symmetical, coordination not cooperative and gait not tested.   Discharge Diet   DIET - DYS 1 Room service  appropriate? Yes; Fluid consistency: Honey Thick liquids  DISCHARGE PLAN  Disposition: SNF ([facility])  aspirin 81 mg daily and Eliquis (apixaban) daily for secondary stroke prevention.  Ongoing risk factor control by Primary Care Physician at time of discharge  Follow-up Reed, [place], DO in 2 weeks.  Follow-up with Dr. Antony Contras, Stroke Clinic in 6 weeks, office to schedule an appointment.  45 minutes were spent preparing discharge.  Rosalin Hawking, MD PhD Stroke Neurology 07/02/2017 5:13 PM

## 2017-07-02 NOTE — Discharge Instructions (Addendum)
Ischemic Stroke An ischemic stroke (cerebrovascular accident, or CVA) is the sudden death of brain tissue that occurs when an area of the brain does not get enough oxygen. It is a medical emergency that must be treated right away. An ischemic stroke can cause permanent loss of brain function. This can cause problems with how different parts of your body function. What are the causes? This condition is caused by a decrease of oxygen supply to an area of the brain, which may be the result of:  A small blood clot (embolus) or a buildup of plaque in the blood vessels (atherosclerosis) that blocks blood flow in the brain.  An abnormal heart rhythm (atrial fibrillation).  A blocked or damaged artery in the head or neck.  What increases the risk? Certain factors may make you more likely to develop this condition. Some of these factors are things that you can change, such as:  Obesity.  Smoking cigarettes.  Taking oral birth control, especially if you also use tobacco.  Physical inactivity.  Excessive alcohol use.  Use of illegal drugs, especially cocaine and methamphetamine.  Other risk factors include:  High blood pressure (hypertension).  High cholesterol.  Diabetes mellitus.  Heart disease.  Being [place] American, Native American, Hispanic, or [place] Native.  Being over age 11.  Family history of stroke.  Previous history of blood clots, stroke, or transient ischemic attack (TIA).  Sickle cell disease.  Being a woman with a history of preeclampsia.  Migraine headache.  Sleep apnea.  Irregular heartbeats, such as atrial fibrillation.  Chronic inflammatory diseases, such as rheumatoid arthritis or lupus.  Blood clotting disorders (hypercoagulable state).  What are the signs or symptoms? Symptoms of this condition usually develop suddenly, or you may notice them after waking up from sleep. Symptoms may include sudden:  Weakness or numbness in your face, arm, or  leg, especially on one side of your body.  Trouble walking or difficulty moving your arms or legs.  Loss of balance or coordination.  Confusion.  Slurred speech (dysarthria).  Trouble speaking, understanding speech, or both (aphasia).  Vision changes--such as double vision, blurred vision, or loss of vision--inone or both eyes.  Dizziness.  Nausea and vomiting.  Severe headache with no known cause. The headache is often described as the worst headache ever experienced.  If possible, make note of the exact time that you last felt like your normal self and what time your symptoms started. Tell your health care provider. If symptoms come and go, this could be a sign of a warning stroke, or TIA. Get help right away, even if you feel better. How is this diagnosed? This condition may be diagnosed based on:  Your symptoms, your medical history, and a physical exam.  CT scan of the brain.  MRI.  CT angiogram. This test uses a computer to take X-rays of your arteries. A dye may be injected into your blood to show the inside of your blood vessels more clearly.  MRI angiogram. This is a type of MRI that is used to evaluate the blood vessels.  Cerebral angiogram. This test uses X-rays and a dye to show the blood vessels in the brain and neck.  You may need to see a health care provider who specializes in stroke care. A stroke specialist can be seen in person or through communication using telephone or television technology (telemedicine). Other tests may also be done to find the cause of the stroke, such as:  Electrocardiogram (ECG).  Continuous  heart monitoring.  Echocardiogram.  Carotid ultrasound.  A scan of the brain circulation.  Blood tests.  Sleep study to check for sleep apnea.  How is this treated? Treatment for this condition will depend on the duration, severity, and cause of your symptoms and on the area of the brain affected. It is very important to get  treatment at the first sign of stroke symptoms. Some treatments work better if they are done within 3-6 hours of the onset of stroke symptoms. These initial treatments may include:  Aspirin.  Medicines to control blood pressure.  Medicine given by injection to dissolve the blood clot (thrombolytic).  Treatments given directly to the affected artery to remove or dissolve the blood clot.  Other treatment options may include:  Oxygen.  IV fluids.  Medicines to thin the blood (anticoagulants or antiplatelets).  Procedures to increase blood flow.  Medicines and changes to your diet may be used to help treat and manage risk factors for stroke, such as diabetes, high cholesterol, and high blood pressure. After a stroke, you may work with physical, speech, mental health, or occupational therapists to help you recover. Follow these instructions at home: Medicines  Take over-the-counter and prescription medicines only as told by your health care provider.  If you were told to take a medicine to thin your blood, such as aspirin or an anticoagulant, take it exactly as told by your health care provider. ? Taking too much blood-thinning medicine can cause bleeding. ? If you do not take enough blood-thinning medicine, you will not have the protection that you need against another stroke and other problems.  Understand the side effects of taking anticoagulant medicine. When taking this type of medicine, make sure you: ? Hold pressure over any cuts for longer than usual. ? Tell your dentist and other health care providers that you are taking anticoagulants before you have any procedures that may cause bleeding. ? Avoid activities that may cause trauma or injury. Eating and drinking  Follow instructions from your health care provider about diet.  Eat healthy foods.  If your ability to swallow was affected by the stroke, you may need to take steps to avoid choking, such as: ? Taking small  bites when eating. ? Eating foods that are soft or pureed. Safety  Follow instructions from your health care team about physical activity.  Use a walker or cane as told by your health care provider.  Take steps to create a safe home environment in order to reduce the risk of falls. This may include: ? Having your home looked at by specialists. ? Installing grab bars in the bedroom and bathroom. ? Using safety equipment, such as raised toilets and a seat in the shower. General instructions  Do not use any tobacco products, such as cigarettes, chewing tobacco, and e-cigarettes. If you need help quitting, ask your health care provider.  Limit alcohol intake to no more than 1 drink a day for nonpregnant women and 2 drinks a day for men. One drink equals 12 oz of beer, 5 oz of wine, or 1 oz of hard liquor.  If you need help to stop using drugs or alcohol, ask your health care provider about a referral to a program or specialist.  Maintain an active and healthy lifestyle. Get regular exercise as told by your health care provider.  Keep all follow-up visits as told by your health care provider, including visits with all specialists on your health care team. This is important. How  is this prevented? Your risk of another stroke can be decreased by managing high blood pressure, high cholesterol, diabetes, heart disease, sleep apnea, and obesity. It can also be decreased by quitting smoking, limiting alcohol, and staying physically active. Your health care provider will continue to work with you on measures to prevent short-term and long-term complications of stroke. Get help right away if: You have:  Sudden weakness or numbness in your face, arm, or leg, especially on one side of your body.  Sudden confusion.  Sudden trouble speaking, understanding, or both (aphasia).  Sudden trouble seeing with one or both eyes.  Sudden trouble walking or difficulty moving your arms or legs.  Sudden  dizziness.  Sudden loss of balance or coordination.  Sudden, severe headache with no known cause.  A partial or total loss of consciousness.  A seizure. Any of these symptoms may represent a serious problem that is an emergency. Do not wait to see if the symptoms will go away. Get medical help right away. Call your local emergency services (911 in [place] Do not drive yourself to the hospital. This information is not intended to replace advice given to you by your health care provider. Make sure you discuss any questions you have with your health care provider. Document Released: 10/05/2005 Document Revised: 03/17/2016 Document Reviewed: 01/01/2016 Elsevier Interactive Patient Education  2017 [employer] on my medicine - ELIQUIS (apixaban)  Why was Eliquis prescribed for you? Eliquis was prescribed for you to reduce the risk of a blood clot forming that can cause a stroke if you have a medical condition called atrial fibrillation (a type of irregular heartbeat).  What do You need to know about Eliquis ? Take your Eliquis TWICE DAILY - one tablet in the morning and one tablet in the evening with or without food. If you have difficulty swallowing the tablet whole please discuss with your pharmacist how to take the medication safely.  Take Eliquis exactly as prescribed by your doctor and DO NOT stop taking Eliquis without talking to the doctor who prescribed the medication.  Stopping may increase your risk of developing a stroke.  Refill your prescription before you run out.  After discharge, you should have regular check-up appointments with your healthcare provider that is prescribing your Eliquis.  In the future your dose may need to be changed if your kidney function or weight changes by a significant amount or as you get older.  What do you do if you miss a dose? If you miss a dose, take it as soon as you remember on the same day and resume taking twice daily.  Do  not take more than one dose of ELIQUIS at the same time to make up a missed dose.  Important Safety Information A possible side effect of Eliquis is bleeding. You should call your healthcare provider right away if you experience any of the following: ? Bleeding from an injury or your nose that does not stop. ? Unusual colored urine (red or dark brown) or unusual colored stools (red or black). ? Unusual bruising for unknown reasons. ? A serious fall or if you hit your head (even if there is no bleeding).  Some medicines may interact with Eliquis and might increase your risk of bleeding or clotting while on Eliquis. To help avoid this, consult your healthcare provider or pharmacist prior to using any new prescription or non-prescription medications, including herbals, vitamins, non-steroidal anti-inflammatory drugs (NSAIDs) and supplements.  This website has more information  on Eliquis (apixaban): http://[email or web address].com/eliquis/home
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
## 2017-07-02 NOTE — Care Management Important Message (Signed)
Important Message  Patient Details  Name: Melinda Noble MRN: [id number] Date of Birth: [birth year]   Medicare Important Message Given:  Yes    Karel Turpen 07/02/2017, 10:22 AM

## 2017-07-02 NOTE — Progress Notes (Signed)
Physical Therapy Treatment Patient Details Name: Melinda Noble MRN: [id number] DOB: 04/17/1930 Today's Date: 07/02/2017    History of Present Illness 83 y.o. female with a history of afib, not on anti-coagulants who presents with right sided weakness.Her husband states that he spoke with her while he was getting out of bed and she was normal, she then was up getting ready when he heard her fall in the bathroom, and found her on the floor with a large R periorbital hematoma, right-sided weakness, and slurred speech.  Initial NIHSS 8.  Administered tPA bolus at 0941 on 06/20/2017. taken emergently to IR and intubated and achieved a reperfusion. Extubated 06/22/17. Reintubated 9/8 due to ? pulmonary edema.     PT Comments    Pt needed less assist to get to standing today.  She seems to have some significant motor planning issues as she did not help initiate motion to EOB and seemed very confused after multiple progressively basic explanations of who we were and what we were trying to help her do (and increasing volume as she is [facility]).  Pt did well with momentum to initiate standing from bed with steady standing frame.  Family is hopeful to d/c to [facility]F.  Follow Up Recommendations  SNF     Equipment Recommendations  None recommended by PT    Recommendations for Other Services       Precautions / Restrictions Precautions Precautions: Fall    Mobility  Bed Mobility Overal bed mobility: Needs Assistance Bed Mobility: Supine to Sit     Supine to sit: +2 for physical assistance;Max assist     General bed mobility comments: Two person max assist to get to EOB to support trunk and transition legs and hips to sitting.  PT manually facilitated pt reaching for bed rail with both hands, however, pt did not pull to assist.   Transfers Overall transfer level: Needs assistance   Transfers: Sit to/from Stand;Stand Pivot Transfers Sit to Stand: +2 physical assistance;Mod assist;From elevated  surface Stand pivot transfers: +2 physical assistance (with steady)       General transfer comment: two person mod assist to stand from elevated bed.  Pt needed manual assist for hand placement on steady, and did well with "one, two, three" using momentum to get up to standing.      Modified Rankin (Stroke Patients Only) Modified Rankin (Stroke Patients Only) Pre-Morbid Rankin Score: No significant disability Modified Rankin: Severe disability     Balance Overall balance assessment: Needs assistance Sitting-balance support: Feet supported;Bilateral upper extremity supported Sitting balance-Leahy Scale: Fair Sitting balance - Comments: supervision EOB once bil feet on the ground and pt squared up on the side of the bed.   Standing balance support: Bilateral upper extremity supported Standing balance-Leahy Scale: Poor Standing balance comment: needs two person assist.  Did well with the standing frame, but would not sit down on the seat.  We transferred her to the chair in standing with seat flaps down.  Pt needed manual assist to initiate sit down.                             Cognition Arousal/Alertness: Awake/alert Behavior During Therapy: Flat affect Overall Cognitive Status: Impaired/Different from baseline Area of Impairment: Orientation;Attention;Memory;Following commands;Awareness;Safety/judgement;Problem solving                 Orientation Level: Disoriented to;Time;Situation;Place (seems to recognize daughter, does not call her by name) Current  Attention Level: Sustained Memory: Decreased recall of precautions;Decreased short-term memory Following Commands: Follows one step commands inconsistently;Follows one step commands with increased time Safety/Judgement: Decreased awareness of safety;Decreased awareness of deficits Awareness: Intellectual Problem Solving: Slow processing;Decreased initiation;Difficulty sequencing;Requires verbal cues;Requires  tactile cues General Comments: Motor planning deficits, difficulty understanding what PT was trying to do (really had to simplify cues and is HOH (slow and loud).           General Comments General comments (skin integrity, edema, etc.): Attempted to help pt eat as she was fully upright and alert in her chair and it did not look like she had eaten any lunch.  Pt only took one sip of drink and one bite of food (which I had to feed to her myself as she would not self feed and not let me manually assist her to hold the utensil).  Interestingly enough, I went to get her daughter to see if she would have better success feeding her and she took another sip of juice and bite of food for me again as if she had forgotten our earlier interaction just 5 mins ago.  Per Engineer, manufacturing, she was feeding herself earlier today and was with SLP as well.  I am not sure what is diffierent right now.      Pertinent Vitals/Pain Pain Assessment: Faces Faces Pain Scale: Hurts little more Pain Location: general discomfot Pain Descriptors / Indicators: Grimacing;Guarding Pain Intervention(s): Limited activity within patient's tolerance;Monitored during session;Repositioned           PT Goals (current goals can now be found in the care plan section) Acute Rehab PT Goals Patient Stated Goal: per family to get therapy Progress towards PT goals: Progressing toward goals    Frequency    Min 3X/week      PT Plan Current plan remains appropriate       AM-PAC PT "6 Clicks" Daily Activity  Outcome Measure  Difficulty turning over in bed (including adjusting bedclothes, sheets and blankets)?: Unable Difficulty moving from lying on back to sitting on the side of the bed? : Unable Difficulty sitting down on and standing up from a chair with arms (e.g., wheelchair, bedside commode, etc,.)?: Unable Help needed moving to and from a bed to chair (including a wheelchair)?: A Lot Help needed walking in hospital room?:  Total Help needed climbing 3-5 steps with a railing? : Total 6 Click Score: 7    End of Session Equipment Utilized During Treatment: Gait belt Activity Tolerance: Patient tolerated treatment well Patient left: in chair;with call bell/phone within reach;with chair alarm set;with family/visitor present Nurse Communication: Mobility status;Need for lift equipment;Other (comment) (purwick catheter removed, full supervision for meals. ) PT Visit Diagnosis: Other symptoms and signs involving the nervous system (R29.898);Other abnormalities of gait and mobility (R26.89);Hemiplegia and hemiparesis Hemiplegia - Right/Left: Right Hemiplegia - dominant/non-dominant: Non-dominant Hemiplegia - caused by: Cerebral infarction     Time: 1540-1610 PT Time Calculation (min) (ACUTE ONLY): 30 min  Charges:  $Therapeutic Activity: 23-37 mins          Krystofer Hevener B. Deport, [place], DPT [phone]            07/02/2017, 4:42 PM

## 2017-07-02 NOTE — Progress Notes (Signed)
CSW contacted by patient's husband, Mikki Santee. Patient's husband indicated that he had spoken with the representative and [name], who explained that they would not be able to take the patient until Monday, and he told them that it was not acceptable. Patient's husband indicated that facility said they didn't have an updated record of the patient's discharge medications in order to get them from the pharmacy; patient's husband requested that MD complete a discharge summary with medications to be dated for tomorrow so that the facility can get what they need. CSW alerted MD; MD to complete DC summary dated for tomorrow so that facility can work towards admitting the patient. Patient's husband indicated that he will do whatever he needs to do in order to get [name] to take the patient for SNF tomorrow.   CSW to follow up tomorrow.  Laveda Abbe, [place] Clinical Social Worker [phone]

## 2017-07-03 LAB — GLUCOSE, CAPILLARY: GLUCOSE-CAPILLARY: 114 mg/dL — AB (ref 65–99)

## 2017-07-03 LAB — CBC
HCT: 35.1 % — ABNORMAL LOW (ref 36.0–46.0)
HEMOGLOBIN: 10.9 g/dL — AB (ref 12.0–15.0)
MCH: 29.8 pg (ref 26.0–34.0)
MCHC: 31.1 g/dL (ref 30.0–36.0)
MCV: 95.9 fL (ref 78.0–100.0)
PLATELETS: 280 10*3/uL (ref 150–400)
RBC: 3.66 MIL/uL — AB (ref 3.87–5.11)
RDW: 16.4 % — ABNORMAL HIGH (ref 11.5–15.5)
WBC: 10.9 10*3/uL — AB (ref 4.0–10.5)

## 2017-07-03 LAB — BASIC METABOLIC PANEL
ANION GAP: 8 (ref 5–15)
BUN: 35 mg/dL — ABNORMAL HIGH (ref 6–20)
CALCIUM: 10.5 mg/dL — AB (ref 8.9–10.3)
CO2: 33 mmol/L — AB (ref 22–32)
Chloride: 104 mmol/L (ref 101–111)
Creatinine, Ser: 0.86 mg/dL (ref 0.44–1.00)
Glucose, Bld: 127 mg/dL — ABNORMAL HIGH (ref 65–99)
Potassium: 3 mmol/L — ABNORMAL LOW (ref 3.5–5.1)
SODIUM: 145 mmol/L (ref 135–145)

## 2017-07-03 NOTE — Progress Notes (Signed)
Patient remains stable. No changes overnight. Refer to discharge summary from Dr Erlinda Hong. Ready for discharge.  Mikey Bussing PA-C [facility] Pager [phone] 07/03/2017, 11:01 AM

## 2017-07-03 NOTE — Clinical Social Work Placement (Signed)
Pt discharging to [facility] today. Pt and spouse Nhung Danko aware and agreeable to discharge plan. CSW confirmed bed with Ledora Bottcher at [facility] [phone]). RN Matt aware and to call report to Autumn at [phone] for room 155. If [facility] cannot be reached, call Erin at [phone]. CSW arranged transportation with [facility]. Signed DNR on chart. CSW called spouse-Robert Naill ([phone]) and spoke with daughter-Mary Alberto [phone]) at bedside with pt. CSW signing off as no further Social Work needs identified.   CLINICAL SOCIAL WORK PLACEMENT  NOTE  Date:  07/03/2017  Patient Details  Name: Melinda Noble MRN: [id number] Date of Birth: 1930/05/29  Clinical Social Work is seeking post-discharge placement for this patient at the [place] level of care (*CSW will initial, date and re-position this form in  chart as items are completed):  Yes   Patient/family provided with [place] Work Department's list of facilities offering this level of care within the geographic area requested by the patient (or if unable, by the patient's family).  Yes   Patient/family informed of their freedom to choose among providers that offer the needed level of care, that participate in Medicare, Medicaid or managed care program needed by the patient, have an available bed and are willing to accept the patient.  Yes   Patient/family informed of [place]'s ownership interest in [facility] and [facility], as well as of the fact that they are under no obligation to receive care at these facilities.  PASRR submitted to EDS on 07/02/17     PASRR number received on 07/02/17     Existing PASRR number confirmed on       FL2 transmitted to all facilities in geographic area requested by pt/family on 07/02/17     FL2 transmitted to all facilities within larger geographic area on       Patient informed that his/her managed care company  has contracts with or will negotiate with certain facilities, including the following:        Yes   Patient/family informed of bed offers received.  Patient chooses bed at [facility]     Physician recommends and patient chooses bed at      Patient to be transferred to [facility] on 07/03/17.  Patient to be transferred to facility by [facility]     Patient family notified on 07/03/17 of transfer.  Name of family member notified:  Spouse Ciela Mahajan [phone]     PHYSICIAN Please prepare prescriptions     Additional Comment:    _______________________________________________ Truitt Merle, LCSW 07/03/2017, 11:27 AM

## 2017-07-03 NOTE — Progress Notes (Addendum)
Patient medication administered prior to discharge. Patient received 12.5 mg Seroquel PO for agitation. External urinary catheter removed. All discharge instructions, follow-up appointments, new medications, and changed medications reviewed and confirmed by patients daughter. Patients daughter v/u. Discharge summary given to [facility] and patient transportingto SNF.

## 2017-07-03 NOTE — Progress Notes (Signed)
ANTICOAGULATION CONSULT NOTE - Initial Consult  Pharmacy Consult for apixaban Indication: atrial fibrillation  Allergies  Allergen Reactions  . Ace Inhibitors Anaphylaxis  . Beta Adrenergic Blockers Anaphylaxis  . Iohexol Anaphylaxis     Code: HIVES, Desc: anaphylactic shock s/p contrast injection many yrs ago--suggested that pt NEVER have iv contrast//a.c., Onset Date: [id number]   . Atorvastatin Rash  . Latex Rash  . Penicillins Swelling and Rash    Details are unknown  . Sulfa Antibiotics Rash    Patient Measurements: Height: 5\' 5"  (165.1 cm) Weight: 205 lb 14.6 oz (93.4 kg) IBW/kg (Calculated) : 57  Vital Signs: Temp: 97.8 F (36.6 C) (09/15 0901) Temp Source: Axillary (09/15 0901) BP: 126/76 (09/15 0901) Pulse Rate: 62 (09/15 0901)  Labs:  Recent Labs  07/02/17 0651 07/03/17 0257  HGB 11.0* 10.9*  HCT 35.1* 35.1*  PLT 256 280  CREATININE 0.82 0.86    Estimated Creatinine Clearance: 55 mL/min (by C-G formula based on SCr of 0.86 mg/dL).   Medical History: Past Medical History:  Diagnosis Date  . Anemia   . CAD (coronary artery disease)    a. STEMI 10/09/13 - 100% LAD - following initial angioplasty, ? Spontaneous dissection vs related to ballon. s/p complex PCI with 2 overlapping DES to LAD. Adm complicated by VDRF, cardiogenic shock, peri-MI PAF with NSR on amiodarone. Residual mod RCA disease.   Marland Kitchen CAD S/P percutaneous coronary angioplasty 10/09/13   DES x 2 to Mid LAD, 2.5 x 38, 2.5 x 12 Promus (covering extensive disection), moderate RCA disesase  . Cardiogenic shock ([place])    a. 09/2013 - during admission for STEMI.  . Depression   . Dyslipidemia   . H/O Acute respiratory failure due to hypoxia 10/10/2013   a. 09/2013 - secondary to anterior STEMI related pulmonary edema (resolved).  . Hypertension   . Hypothyroidism   . Ischemic cardiomyopathy    a. EF 30-35% during 09/2013 adm for STEMI. b. Repeat Echo EF 40-45% with distal LAD hypokinesis  (10/16/14 following STEMI recovery).  . Migraine headache    a. Remotely.  . Orthostatic hypotension    a. Adm 10/2013 for this, felt due to Lasix. Not on regular diuretic due to this/dehydration.  Marland Kitchen PAF (paroxysmal atrial fibrillation) ([place])    a. 09/2013 - peri-MI, NSR at discharge on amiodarone.  . Sinus bradycardia    Assessment: 35 yof presented with R sided weakness and received IV tPA and went to IR. Now starting apixaban for stroke prevention with afib. CBC low but stable with normal plts.  Renal function remains good with no need to adjust dose at this time.  Goal of Therapy:  Therapeutic anticoagulation Monitor platelets by anticoagulation protocol: Yes   Plan:  Apixiban 5mg  PO BID F/u S&S of bleeding, renal fxn  Melinda Noble 07/03/2017,10:01 AM

## 2017-07-04 DIAGNOSIS — T814XXA Infection following a procedure, initial encounter: Secondary | ICD-10-CM | POA: Diagnosis not present

## 2017-07-05 ENCOUNTER — Telehealth: Payer: Self-pay

## 2017-07-05 NOTE — Telephone Encounter (Signed)
Possible re-admission to facility. This is a patient you were seeing at [facility] . [facility] F/U is needed if patient was re-admitted to facility upon discharge. Hospital discharge from [facility] on 07/03/2017

## 2017-07-06 ENCOUNTER — Non-Acute Institutional Stay (SKILLED_NURSING_FACILITY): Payer: Medicare Other | Admitting: Internal Medicine

## 2017-07-06 ENCOUNTER — Encounter: Payer: Self-pay | Admitting: Internal Medicine

## 2017-07-06 DIAGNOSIS — I639 Cerebral infarction, unspecified: Secondary | ICD-10-CM | POA: Diagnosis not present

## 2017-07-06 DIAGNOSIS — R4701 Aphasia: Secondary | ICD-10-CM

## 2017-07-06 DIAGNOSIS — I2510 Atherosclerotic heart disease of native coronary artery without angina pectoris: Secondary | ICD-10-CM

## 2017-07-06 DIAGNOSIS — G8100 Flaccid hemiplegia affecting unspecified side: Secondary | ICD-10-CM

## 2017-07-06 DIAGNOSIS — R278 Other lack of coordination: Secondary | ICD-10-CM | POA: Diagnosis not present

## 2017-07-06 DIAGNOSIS — I724 Aneurysm of artery of lower extremity: Secondary | ICD-10-CM

## 2017-07-06 DIAGNOSIS — Z9861 Coronary angioplasty status: Secondary | ICD-10-CM

## 2017-07-06 DIAGNOSIS — I69851 Hemiplegia and hemiparesis following other cerebrovascular disease affecting right dominant side: Secondary | ICD-10-CM | POA: Diagnosis not present

## 2017-07-06 DIAGNOSIS — I69391 Dysphagia following cerebral infarction: Secondary | ICD-10-CM | POA: Diagnosis not present

## 2017-07-06 DIAGNOSIS — E039 Hypothyroidism, unspecified: Secondary | ICD-10-CM | POA: Diagnosis not present

## 2017-07-06 DIAGNOSIS — I480 Paroxysmal atrial fibrillation: Secondary | ICD-10-CM | POA: Diagnosis not present

## 2017-07-06 DIAGNOSIS — I69311 Memory deficit following cerebral infarction: Secondary | ICD-10-CM | POA: Diagnosis not present

## 2017-07-06 DIAGNOSIS — M62562 Muscle wasting and atrophy, not elsewhere classified, left lower leg: Secondary | ICD-10-CM | POA: Diagnosis not present

## 2017-07-06 DIAGNOSIS — I5022 Chronic systolic (congestive) heart failure: Secondary | ICD-10-CM | POA: Diagnosis not present

## 2017-07-06 DIAGNOSIS — I63312 Cerebral infarction due to thrombosis of left middle cerebral artery: Secondary | ICD-10-CM | POA: Diagnosis not present

## 2017-07-06 DIAGNOSIS — E669 Obesity, unspecified: Secondary | ICD-10-CM

## 2017-07-06 DIAGNOSIS — J810 Acute pulmonary edema: Secondary | ICD-10-CM | POA: Diagnosis not present

## 2017-07-06 DIAGNOSIS — I69393 Ataxia following cerebral infarction: Secondary | ICD-10-CM | POA: Diagnosis not present

## 2017-07-06 DIAGNOSIS — I69398 Other sequelae of cerebral infarction: Secondary | ICD-10-CM | POA: Diagnosis not present

## 2017-07-06 DIAGNOSIS — Z79899 Other long term (current) drug therapy: Secondary | ICD-10-CM

## 2017-07-06 DIAGNOSIS — I63412 Cerebral infarction due to embolism of left middle cerebral artery: Secondary | ICD-10-CM | POA: Diagnosis not present

## 2017-07-06 DIAGNOSIS — R2689 Other abnormalities of gait and mobility: Secondary | ICD-10-CM | POA: Diagnosis not present

## 2017-07-06 DIAGNOSIS — I69320 Aphasia following cerebral infarction: Secondary | ICD-10-CM | POA: Diagnosis not present

## 2017-07-06 DIAGNOSIS — M62561 Muscle wasting and atrophy, not elsewhere classified, right lower leg: Secondary | ICD-10-CM | POA: Diagnosis not present

## 2017-07-06 NOTE — Progress Notes (Signed)
Patient ID: Melinda Noble, female   DOB: Aug 04, 1930, 83 y.o.   MRN: [id number]  Provider:  Rexene Edison. Mariea Clonts, D.O., C.M.D. Location:  [place] Room Number: [place] of Service:  SNF (31)  PCP: Gayland Curry, DO Patient Care Team: Gayland Curry, DO as PCP - General (Geriatric Medicine) [facility] Gaudier, Leonie Green, MD as Consulting Physician (Cardiology) Warden Fillers, MD as Consulting Physician (Ophthalmology) Belva Crome, MD as Consulting Physician (Cardiology)  Extended Emergency Contact Information Primary Emergency Contact: Ulice Dash Address: [address]          [place], [place] [address] Johnnette Litter of [place] Phone: [phone] Work Phone: [phone] Mobile Phone: [phone] Relation: Spouse Secondary Emergency Contact: Celestine,Blake Address: [address]          [place], [place] [address] Johnnette Litter of [place] Phone: [phone] Mobile Phone: [phone] Relation: Son  Code Status: DNR Goals of Care: Advanced Directive information Advanced Directives 07/06/2017  Does Patient Have a Medical Advance Directive? Yes  Type of Advance Directive Out of facility DNR (pink MOST or yellow form);Healthcare Power of Attorney  Does patient want to make changes to medical advance directive? -  Copy of [place] in Chart? Yes  Would patient like information on creating a medical advance directive? -  Pre-existing out of facility DNR order (yellow form or pink MOST form) Yellow form placed in chart (order not valid for inpatient use)   Chief Complaint  Patient presents with  . New Admit To SNF    Rehab admission    HPI: Patient is a 83 y.o. female with h/o HTN, hyperlipidemia, CAD s/p cath with DES x 2 to mid LAD and moderate RCA disease found in context of MI, ischemic cardiomyopathy with EF 40-45% in 2015 and paroxysmal afib and early dementia (with difficulty  managing meds and appts) seen today for admission to [facility] after hospitalization from 06/20/17 to 07/03/17 with acute/subacture left MCA infarcts and right MCA punctate infarcts, embolic likely due to paroxysmal afib not on anticoagulation, s/p mechanical thrombectomy of left MCA with TICI 2b reperfusion.  She had established with me in August of 2017 yet her chart again reads that Dr. Inda Merlin is her PCP as did [facility] (perhaps pt did not remember establishing with me or went back to him in between?).  She had presented to the hospital via [facility] after a fall when getting ready for church. Her husband had seen her in her normal state of health, but then heard a thud and found her to have a hematoma over her right eye, slurred speech and flaccid on the right side of her body.  She had a right facial droop.  CT was negative for acute abnormalities, no facial fractures.   MRA showed likely M1 arterial occlusion. She was taken to interventional radiology and IV tPA given.  She was seen by Dr. Leonel Ramsay and hba1c, flp, MRI w/o contrast, frequent neurochecks, echo, full dose asa, risk factor modification, tele, PT, OT, ST and continued synthroid were ordered.  She was hypoxic and tachypneic with POX 75% after transfer back to the ED stretcher from the MRI table.  NIHSS was 10.  She was diaphoretic and hypertensive, mental status declined.  Repeat CT performed which was negative.  She had to be intubated and put on mechanical ventilation after cerebral angiogram (bilateral carotid arterograms and byendovascular revascularization of occluded left MCA achieving TICI 2b  reperfusion)--done via right femoral.  She was admitted to ICU. She initially required a nicardipine drip.  Permissive htn was maintained.  Crestor resumed.  She was extubated 06/22/17.  Swallow eval recommended.  Family wanted full code maintained as of 9/4, but no prolonged ventilatory support and tracheostomy.  ST recommended MBS and ST  at rehab. Hematoma noted in groin on right. PT and OT evals done 9/4 also and skilled care recommended.  Vascular ordered US groin to evaluate for pseudoaneurysm 9/5.  Echo performed showing mildly reduced systolic function to 83-33%, akinesis of apex, grade 1 diastolic dysfunction.  Vascular ultrasound did reveal pseudoaneurysm of right groin with surrounding hematoma.  She underwent repair of the psuedoaneurysm due to skin compromise. Apparently, she stayed intubated b/c she was extubated again 9/6 to 4L [place].  9/6, she had some agitation and was felt to be sundowning so haldol was given and a soft waist belt restraint placed. She was then in afib also.  Both arms were noted to be swollen so arterial dopplers were ordered.  MBS was finally done on 9/7 and persistent NPO recommended after aspiration of all consistencies.  Temporary feeding tube (NGT) was placed.  She was given IV lasix bid.  Code status was readdressed and husband and family who decided upon DNR if cardiac arrest, but intubation short term if needed.  She was reintubated on 9/8 for recurrence of respiratory failure from pulmonary edema.  Palliative care was consulted with meeting planned for 9/9 at 2pm.  Troponin was elevated at 1.19 on 9/8.  Tube feeding initiated 9/8.  Palliative care NP did meet with pt's daughter Vinnie Level who expressed that pt's wishes were for DNR/DNI, but pt's husband was struggling with that decision so the partial code status (ok to intubate, but no cpr/shock) was maintained at that point.  Central line was placed for possible pressor support--phenylephrine was required--CVP monitored. Drain she apparently had for her pseudoaneurysm was d/cd on 9/9.  Palliative performance scale on 9/9 was 30%.  Romona Curls met with pt's two daughters and two sons plus her husband.  They decided not to reintubate, no tracheostomy, full DNR status, continued palliative medicine involvement.  No DVT was found in arms.  She had difficulty with  hypokalemia requiring repletion more than once.  PT noted cognition limiting progress.  Pt was able to sit on edge of bed on 9/10.  With OT, required help to elevated upper body and then dizzy sitting up.  9/11, notes indicate she was on day 4 of levaquin for pneumonia though her respiratory failure was felt to be due to pulmonary edema.  Persistent aphasia was noted.  She was again extubated on 9/11 to 4L.  NGT was pulled out by patient.  ST eval done again and recommended repeat MBS 9/12.  Diet was advanced 9/12 and magic cup ordered tid. On the MBS, she tolerated all but thin liquids.  She was started on dysphagia 1 with honey-thick liquids.  She needed 1:1 cues for meals.  Pt's husband told Dr. Hilma Favors on 9/12 that [facility] was arranging palliative care services. Husband told [facility] that pt was back to seeing Dr. Inda Merlin not me on 9/13--she had not kept her annual wellness visit this summer. I'm not sure when she last saw him or why that changed.  On 9/14, she was started on eliquis 97m po bid for afib and embolic stroke prevention.  9/14, dysphagia 1 (pureed)/honey-thick recommended, liquids with cup, crushed meds with puree and supervision/cuing recommended.  9/15, she was discharged to [facility].    Since patient has been here, her sutures were removed from her right groin wound and apparently quite a bit of drainage came out which was sent for culture per on call provider.  There were no signs of infection at that time (no foul odor, erythema, increased pain, fever), but wound had dehisced and required some packing and was then covered with an absorbent dressing.    She has been seen today by OT and ST here.  She has notable aphasia not only expressive but receptive type.  She has some core weakness and persistent diminished right-sided strength more in her right leg vs arm.  A facial droop and dysarthria also persist.  Communication was very limited even with writing things down for her--she still  had difficulty with receiving the information.  She is refusing her modified diet at times--seems that this was what she was complaining about to me today--too much oatmeal and not enough of foods she actually enjoys.    Past Medical History:  Diagnosis Date  . Anemia   . CAD (coronary artery disease)    a. STEMI 10/09/13 - 100% LAD - following initial angioplasty, ? Spontaneous dissection vs related to ballon. s/p complex PCI with 2 overlapping DES to LAD. Adm complicated by VDRF, cardiogenic shock, peri-MI PAF with NSR on amiodarone. Residual mod RCA disease.   Marland Kitchen CAD S/P percutaneous coronary angioplasty 10/09/13   DES x 2 to Mid LAD, 2.5 x 38, 2.5 x 12 Promus (covering extensive disection), moderate RCA disesase  . Cardiogenic shock ([place])    a. 09/2013 - during admission for STEMI.  . Depression   . Dyslipidemia   . H/O Acute respiratory failure due to hypoxia 10/10/2013   a. 09/2013 - secondary to anterior STEMI related pulmonary edema (resolved).  . Hypertension   . Hypothyroidism   . Ischemic cardiomyopathy    a. EF 30-35% during 09/2013 adm for STEMI. b. Repeat Echo EF 40-45% with distal LAD hypokinesis (10/16/14 following STEMI recovery).  . Migraine headache    a. Remotely.  . Orthostatic hypotension    a. Adm 10/2013 for this, felt due to Lasix. Not on regular diuretic due to this/dehydration.  Marland Kitchen PAF (paroxysmal atrial fibrillation) ([place])    a. 09/2013 - peri-MI, NSR at discharge on amiodarone.  . Sinus bradycardia    Past Surgical History:  Procedure Laterality Date  . CORONARY ANGIOPLASTY WITH STENT PLACEMENT  10/09/13   2 overlapping Promus DES to mid LAD, occlusion/dissection (2.5 x 38, 2.5 x 12)  . FEMORAL ARTERY EXPLORATION Right 06/23/2017   Procedure: repair right femoral pseudoanerysm; evacuation hemotoma;  Surgeon: Elam Dutch, MD;  Location[facility] OR;  Service: Vascular;  Laterality: Right;  . IR ANGIO INTRA EXTRACRAN SEL COM CAROTID INNOMINATE UNI R MOD SED   06/20/2017  . IR PERCUTANEOUS ART THROMBECTOMY/INFUSION INTRACRANIAL INC DIAG ANGIO  06/20/2017  . IR US GUIDE BX ASP/DRAIN  06/23/2017  . LEFT HEART CATHETERIZATION WITH CORONARY ANGIOGRAM N/A 10/09/2013   Procedure: LEFT HEART CATHETERIZATION WITH CORONARY ANGIOGRAM;  Surgeon: Leonie Man, MD;  Location: [facility] CATH LAB;  Service: Cardiovascular;  Laterality: N/A;  . RADIOLOGY WITH ANESTHESIA N/A 06/20/2017   Procedure: RADIOLOGY WITH ANESTHESIA;  Surgeon: Luanne Bras, MD;  Location: [facility];  Service: Radiology;  Laterality: N/A;  . SHOULDER SURGERY      reports that she has quit smoking. She has never used smokeless tobacco. She reports that she  does not drink alcohol or use drugs. Social History   Social History  . Marital status: Married    Spouse name: N/A  . Number of children: N/A  . Years of education: N/A   Occupational History  . Not on file.   Social History Main Topics  . Smoking status: Former Research scientist (life sciences)  . Smokeless tobacco: Never Used  . Alcohol use No  . Drug use: No  . Sexual activity: Not on file   Other Topics Concern  . Not on file   Social History Narrative   Patient is Married, since 1958 [name])   Lives in apartment, Independent Living section at [facility] since 04/2013.   Stopped Smoking many years ago, minimal aalcohol history.   Regular exercise includes swimming 3d/week,   Patient has no Advanced planning documents    Functional Status Survey:    Family History  Problem Relation Age of Onset  . Varicose Veins Father   . Heart attack Father   . Heart failure Mother   . Anemia Mother     Health Maintenance  Topic Date Due  . Samul Dada  10/17/1949  . DEXA SCAN  10/16/1997  . PNA vac Low Risk Adult (2 of 2 - PPSV23) 04/05/2015  . INFLUENZA VACCINE  05/19/2017    Allergies  Allergen Reactions  . Ace Inhibitors Anaphylaxis  . Beta Adrenergic Blockers Anaphylaxis  . Iohexol Anaphylaxis     Code: HIVES, Desc:  anaphylactic shock s/p contrast injection many yrs ago--suggested that pt NEVER have iv contrast//a.c., Onset Date: [id number]   . Atorvastatin Rash  . Latex Rash  . Penicillins Swelling and Rash    Details are unknown  . Sulfa Antibiotics Rash    Outpatient Encounter Prescriptions as of 07/06/2017  Medication Sig  . acetaminophen (TYLENOL) 325 MG tablet Take 650 mg by mouth every 6 (six) hours as needed for mild pain.  Marland Kitchen ALPRAZolam (XANAX) 0.25 MG tablet TAKE HALF TABLET (0.186m) TWICE DAILY AS NEEDED FOR ANXIETY.  .Marland Kitchenapixaban (ELIQUIS) 5 MG TABS tablet Take 1 tablet (5 mg total) by mouth 2 (two) times daily.  .Marland Kitchenaspirin 81 MG chewable tablet Chew 1 tablet (81 mg total) by mouth daily.  . Biotin 1000 MCG tablet Take 1,000 mcg by mouth daily.  .Marland KitchenbuPROPion (WELLBUTRIN SR) 150 MG 12 hr tablet Take 150 mg by mouth every morning.   . calcium-vitamin D (OSCAL WITH D) 500-200 MG-UNIT per tablet Take 1 tablet by mouth daily.   . carvedilol (COREG) 6.25 MG tablet Take 1 tablet (6.25 mg total) by mouth 2 (two) times daily with a meal.  . cholecalciferol (VITAMIN D) 1000 UNITS tablet Take 1,000 Units by mouth daily.  . clobetasol ointment (TEMOVATE) 05.40% Apply 1 application topically 2 (two) times daily as needed. (affected areas)--also thighs  . fexofenadine (ALLEGRA) 60 MG tablet Take 60 mg by mouth 2 (two) times daily as needed for allergies or rhinitis.  . furosemide (LASIX) 20 MG tablet Take 0.5 tablets (10 mg total) by mouth daily.  . montelukast (SINGULAIR) 10 MG tablet Take 10 mg by mouth at bedtime.  . Multiple Vitamin (MULTIVITAMIN WITH MINERALS) TABS tablet Take 1 tablet by mouth daily.  . nitroGLYCERIN (NITROSTAT) 0.4 MG SL tablet Place 0.4 mg under the tongue every 5 (five) minutes as needed for chest pain.  .Marland Kitchenomeprazole (PRILOSEC) 20 MG capsule Take 20 mg by mouth daily.   .Vladimir FasterGlycol-Propyl Glycol (SYSTANE OP) Apply 1 drop to eye  daily as needed (dry eyes).  . rosuvastatin  (CRESTOR) 10 MG tablet Take 1 tablet (10 mg total) by mouth 3 (three) times a week. (Mondays, Wednesdays, and Fridays)  . sertraline (ZOLOFT) 100 MG tablet Take 100 mg by mouth daily.   Marland Kitchen SYNTHROID 100 MCG tablet TAKE 1 TABLET (11mg) IN THE MORNING ON AN EMPTY STOMACH.  . vitamin E (VITAMIN E) 400 UNIT capsule Take 400 Units by mouth daily.   Facility-Administered Encounter Medications as of 07/06/2017  Medication  . etomidate (AMIDATE) injection  . rocuronium (ZEMURON) injection  . succinylcholine (ANECTINE) injection    Review of Systems  Constitutional: Positive for activity change, appetite change and fatigue. Negative for chills and fever.  HENT: Positive for hearing loss. Negative for congestion.   Eyes: Negative for visual disturbance.  Respiratory: Positive for cough. Negative for apnea, choking, chest tightness and shortness of breath.   Cardiovascular: Negative for chest pain, palpitations and leg swelling.  Gastrointestinal: Negative for abdominal pain, constipation, diarrhea, nausea and vomiting.  Genitourinary: Negative for dysuria.  Musculoskeletal: Positive for gait problem. Negative for back pain.  Skin: Positive for pallor.  Neurological: Positive for facial asymmetry, speech difficulty, weakness and light-headedness. Negative for dizziness, tremors, seizures, syncope and headaches.  Hematological: Bruises/bleeds easily.  Psychiatric/Behavioral: Positive for confusion. Negative for behavioral problems, sleep disturbance and suicidal ideas. The patient is not nervous/anxious.     Vitals:   07/06/17 1025  BP: 140/78  Pulse: 67  Resp: 18  Temp: 97.6 F (36.4 C)  TempSrc: Oral  SpO2: 92%  Weight: 190 lb (86.2 kg)  Height: '5\' 5"'  (1.651 m)   Body mass index is 31.62 kg/m. Physical Exam  Constitutional: She appears well-developed and well-nourished.  HENT:  Head: Normocephalic.  Right Ear: External ear normal.  Left Ear: External ear normal.  Nose: Nose  normal.  Mouth/Throat: Oropharynx is clear and moist.  Dry mouth  Eyes: Pupils are equal, round, and reactive to light. Conjunctivae are normal.  Neck: Normal range of motion. Neck supple. No JVD present. No tracheal deviation present. No thyromegaly present.  Cardiovascular:  No murmur heard. irreg irreg  Pulmonary/Chest: Effort normal and breath sounds normal. No stridor. No respiratory distress.  Abdominal: Soft. Bowel sounds are normal. She exhibits no distension. There is no tenderness.  Musculoskeletal:  Decreased mobility of right leg  Lymphadenopathy:    She has no cervical adenopathy.  Neurological: She is alert. She exhibits abnormal muscle tone.  Facial droop, 4/5 right sided strength, severe dysarthria and aphasia  Skin: Skin is warm and dry.  Ecchymoses of right side of face and eye from fall  Psychiatric:  Difficult to assess with her severe aphasia    Labs reviewed: Basic Metabolic Panel:  Recent Labs  06/27/17 1725 06/28/17 0510 06/28/17 1745 06/29/17 0431 06/30/17 0413 07/02/17 0651 07/03/17 0257  NA 140 142  --  140 141 141 145  K 2.8* 3.5  --  3.5 3.4* 3.0* 3.0*  CL 104 107  --  107 103 103 104  CO2 28 30  --  29 33* 30 33*  GLUCOSE 104* 142*  --  139* 115* 146* 127*  BUN 28* 34*  --  33* 20 29* 35*  CREATININE 0.55 0.63  --  0.62 0.69 0.82 0.86  CALCIUM 8.7* 9.1  --  9.3 9.5 10.1 10.5*  MG 2.1 1.8  --   --  1.6*  --   --   PHOS 1.9* 1.6* 3.5 2.1* 2.8  --   --  Liver Function Tests:  Recent Labs  01/26/17 0855 06/20/17 0819 06/21/17 0349 06/24/17 0410  AST 23 26  --   --   ALT 20 23  --   --   ALKPHOS 84 72  --   --   BILITOT 0.5 0.6  --   --   PROT 6.7 6.7  --   --   ALBUMIN 4.2 3.9 3.0* 2.7*   No results for input(s): LIPASE, AMYLASE in the last 8760 hours. No results for input(s): AMMONIA in the last 8760 hours. CBC:  Recent Labs  06/21/17 0349  06/27/17 0445 06/28/17 0510 06/29/17 0431 07/02/17 0651 07/03/17 0257  WBC  13.8*  < > 10.0 9.1 9.2 9.4 10.9*  NEUTROABS 11.1*  --  6.9 6.1  --   --   --   HGB 11.9*  < > 8.1* 8.2* 8.1* 11.0* 10.9*  HCT 37.4  < > 24.9* 26.2* 26.3* 35.1* 35.1*  MCV 94.7  < > 92.6 93.9 95.6 94.9 95.9  PLT 174  < > 172 196 201 256 280  < > = values in this interval not displayed. Cardiac Enzymes:  Recent Labs  06/26/17 0949 06/26/17 1803  TROPONINI 1.19* 1.61*   BNP: Invalid input(s): POCBNP Lab Results  Component Value Date   HGBA1C 5.4 06/21/2017   Lab Results  Component Value Date   TSH 2.03 05/11/2016   No results found for: VITAMINB12 No results found for: FOLATE No results found for: IRON, TIBC, FERRITIN  Imaging and Procedures obtained prior to SNF admission: Ct Head Wo Contrast  Result Date: 06/21/2017 CLINICAL DATA:  83 y/o  F; stroke for follow-up. EXAM: CT HEAD WITHOUT CONTRAST TECHNIQUE: Contiguous axial images were obtained from the base of the skull through the vertex without intravenous contrast. COMPARISON:  06/20/2017 CT head FINDINGS: Brain: Hypoattenuation within the left insula, left superior lentiform nucleus, left centrum semiovale compatible with infarction, stable in distribution comparison with the prior CT of head. Mild interval increase in hypoattenuation and edema associated with the infarct. Left posterior temporal and loss of gray-white differentiation compatible with acute infarction, not present on prior MRI or CT of the head. No interval hemorrhage or significant mass effect. No effacement of basilar cisterns. Diminished size of density in the posterior left sylvian fissure. Stable chronic microvascular ischemic changes of the brain and parenchymal volume loss. Vascular: Calcific atherosclerosis of carotid siphons. Skull: Normal. Negative for fracture or focal lesion. Sinuses/Orbits: No acute finding. Other: None. IMPRESSION: 1. New left posterolateral temporal lobe acute infarction. 2. Stable infarction centered in left frontal centrum semiovale  extending into left superior lentiform nucleus and insula in comparison with prior CT/MRI head. Interval increase in edema and local mass effect associated with the infarct. 3. No acute intracranial hemorrhage. These results will be called to the ordering clinician or representative by the Radiologist Assistant, and communication documented in the PACS or zVision Dashboard. Electronically Signed   By: Kristine Garbe M.D.   On: 06/21/2017 04:57   Ct Head Wo Contrast  Result Date: 06/20/2017 CLINICAL DATA:  Status post revascularization of the left MCA. Right-sided defects. EXAM: CT HEAD WITHOUT CONTRAST TECHNIQUE: Contiguous axial images were obtained from the base of the skull through the vertex without intravenous contrast. COMPARISON:  CT head without contrast of the same day. FINDINGS: Brain: A focal density is noted posteriorly within the left sylvian fissure. This may represent contrast adjacent to a distal occlusion. There was faint density in this area on  the earlier study in retrospect. No hemorrhage or mass lesion is present. Diffuse atrophy and white matter changes are otherwise stable. No focal cortical defects are present. The ventricles are stable. Vascular: Contrast is now noted within the major intracranial artery is. Distal left MCA lesion may be present as discussed above. Skull: Calvarium is intact. No focal lytic or blastic lesions are present. Sinuses/Orbits: The paranasal sinuses and mastoid air cells are clear. Bilateral lens replacements are present. The globes and orbits are otherwise normal. Other: Right infraorbital hematoma has expanded. IMPRESSION: 1. Focal density posteriorly in the left sylvian fissure may reflect contrast associated with a more distal occlusion. 2. No acute hemorrhage. 3. No evidence for progressing infarct. 4. Stable atrophy and white matter disease. 5. Increasing size of right infraorbital hematoma. Electronically Signed   By: San Morelle M.D.    On: 06/20/2017 12:48   [name] Head Wo Contrast  Result Date: 06/20/2017 CLINICAL DATA:  Acute onset of right-sided weakness. Code stroke. Fall with right facial hematoma. EXAM: MRI HEAD WITHOUT CONTRAST MRA HEAD WITHOUT CONTRAST TECHNIQUE: Multiplanar, multiecho pulse sequences of the brain and surrounding structures were obtained without intravenous contrast. Angiographic images of the head were obtained using MRA technique without contrast. COMPARISON:  CT head without contrast from the same day. FINDINGS: MRI HEAD FINDINGS Diffusion only imaging was requested by Dr. Leonel Ramsay. The do pack scratched at the axial diffusion weighted images are mildly degraded by patient motion. There is an ill-defined area of potential restricted diffusion involving the left centrum semi ovale measuring 12 x 38 mm. No definite cortical lesions are present. There is no evidence for parenchymal hemorrhage. Right periorbital soft tissue hematoma is again noted. MRA HEAD FINDINGS The time-of-flight images are moderately degraded by patient motion. Flow is present in the internal carotid artery is through the ICA termini bilaterally. The A1 segments are patent bilaterally. ACA vessels are seen bilaterally. There is some narrowing of the distal right M1 segment. There is significant signal loss in the distal left M1 segment after an early inferior branch. Left MCA branch vessels are not visualized. There is marked attenuation of right MCA branch vessels. The left vertebral artery is the dominant vessel. The basilar artery is patent. Both posterior cerebral arteries originate from basilar tip. PCA branch vessels are attenuated bilaterally. IMPRESSION: 1. High-grade stenosis or occlusion of the distal left M1 segment with nonvisualization of distal MCA branch vessels. This corresponds with the patient's acute symptoms, likely representing an emergent large vessel occlusion. 2. Narrowing of the right M1 segment and attenuation of MCA  branch vessels likely related to atherosclerotic disease. 3. Posterior circulation is intact. 4. Ill-defined area of restricted diffusion within white matter of the left centrum semi ovale may reflect acute ischemia. No definite cortical infarct is present. These results were called by telephone at the time of interpretation on 06/20/2017 at 9:28 am to Dr. Roland Rack , who verbally acknowledged these results. Electronically Signed   By: San Morelle M.D.   On: 06/20/2017 09:40   Mr Brain Wo Contrast  Result Date: 06/21/2017 CLINICAL DATA:  Right hemiplegia and aphasia. The patient remains intubated. EXAM: MRI HEAD WITHOUT CONTRAST TECHNIQUE: Multiplanar, multiecho pulse sequences of the brain and surrounding structures were obtained without intravenous contrast. COMPARISON:  CT head without contrast 06/21/2017 at 4:41 a.m. CT head without contrast 06/20/2017 12:08 p.m. MRI brain and 06/20/2017 at 9:13 a.m. FINDINGS: Brain: Diffusion-weighted images now demonstrate significant extension of left MCA territory infarct. There  is diffuse restricted diffusion at the left insular ribbon and a left frontal operculum. There is extensive involvement of the posterior left temporal lobe and parietal lobe. Smaller focal cortical involvement is seen in the anterior left frontal lobe and posterior left parietal lobe. A focal area of restricted cortical will diffusion is present in the posterior right parietal lobe with scattered foci involving the posterior right centrum semiovale. Vascular: Abnormal signal is present in the proximal posterior left M2 branch suggesting slow or occluded flow. Proximal flow is present in the major intracranial arteries. Skull and upper cervical spine: The skullbase is within normal limits. The craniocervical junction is normal. Marrow signal is normal. The upper cervical spine is unremarkable. Sinuses/Orbits: Mild mucosal thickening is present within the anterior ethmoid air cells.  The paranasal sinuses are otherwise clear. Mild mastoid fluid is now present. There is some fluid in the posterior oropharynx as well. The patient is intubated. Bilateral lens replacements are present. The globes and orbits are within normal limits. Other: Right periorbital hematoma is again noted. IMPRESSION: 1. Increased size of acute/subacute left MCA territory infarct, now involving the left insular cortex, left frontal operculum, posterior left temporal and parietal lobe, and more anterior superior left frontal lobe. 2. No evidence for hemorrhagic conversion of left-sided infarcts. 3. Focal acute nonhemorrhagic infarct in the right parietal lobe and centrum semiovale. Bilateral disease raises concern for a central cardio embolic source. 4. Right periorbital hematoma. Electronically Signed   By: San Morelle M.D.   On: 06/21/2017 12:45   Mr Brain Wo Contrast  Result Date: 06/20/2017 CLINICAL DATA:  Acute onset of right-sided weakness. Code stroke. Fall with right facial hematoma. EXAM: MRI HEAD WITHOUT CONTRAST MRA HEAD WITHOUT CONTRAST TECHNIQUE: Multiplanar, multiecho pulse sequences of the brain and surrounding structures were obtained without intravenous contrast. Angiographic images of the head were obtained using MRA technique without contrast. COMPARISON:  CT head without contrast from the same day. FINDINGS: MRI HEAD FINDINGS Diffusion only imaging was requested by Dr. Leonel Ramsay. The do pack scratched at the axial diffusion weighted images are mildly degraded by patient motion. There is an ill-defined area of potential restricted diffusion involving the left centrum semi ovale measuring 12 x 38 mm. No definite cortical lesions are present. There is no evidence for parenchymal hemorrhage. Right periorbital soft tissue hematoma is again noted. MRA HEAD FINDINGS The time-of-flight images are moderately degraded by patient motion. Flow is present in the internal carotid artery is through the ICA  termini bilaterally. The A1 segments are patent bilaterally. ACA vessels are seen bilaterally. There is some narrowing of the distal right M1 segment. There is significant signal loss in the distal left M1 segment after an early inferior branch. Left MCA branch vessels are not visualized. There is marked attenuation of right MCA branch vessels. The left vertebral artery is the dominant vessel. The basilar artery is patent. Both posterior cerebral arteries originate from basilar tip. PCA branch vessels are attenuated bilaterally. IMPRESSION: 1. High-grade stenosis or occlusion of the distal left M1 segment with nonvisualization of distal MCA branch vessels. This corresponds with the patient's acute symptoms, likely representing an emergent large vessel occlusion. 2. Narrowing of the right M1 segment and attenuation of MCA branch vessels likely related to atherosclerotic disease. 3. Posterior circulation is intact. 4. Ill-defined area of restricted diffusion within white matter of the left centrum semi ovale may reflect acute ischemia. No definite cortical infarct is present. These results were called by telephone at the time  of interpretation on 06/20/2017 at 9:28 am to Dr. Roland Rack , who verbally acknowledged these results. Electronically Signed   By: San Morelle M.D.   On: 06/20/2017 09:40   Dg Chest Port 1 View  Result Date: 06/20/2017 CLINICAL DATA:  routine EXAM: PORTABLE CHEST 1 VIEW COMPARISON:  9.2.18 FINDINGS: Endotracheal to 3.6 cm from carina in good position. Two stable large cardiac silhouette. Mild venous congestion. No infiltrate or pneumothorax. IMPRESSION: Endotracheal tube in good position Cardiomegaly and mild central venous congestion. Electronically Signed   By: Suzy Bouchard M.D.   On: 06/20/2017 17:20   Dg Chest Port 1 View  Result Date: 06/20/2017 CLINICAL DATA:  Acute respiratory failure with hypoxia EXAM: PORTABLE CHEST 1 VIEW COMPARISON:  05/04/2016 CXR, chest CT  10/09/2013 FINDINGS: The tip of an endotracheal tube is 2.6 cm the carina. Cardiomegaly is noted with tortuous atherosclerotic aorta. No aneurysm. Small granuloma is seen along the periphery of the left lower lobe, confirmed on prior CT. No effusion or pneumothorax no overt pulmonary edema or pneumonic consolidation. IMPRESSION: 1. Stable cardiomegaly.  Aortic atherosclerosis with mild uncoiling. 2. Slightly low-lying endotracheal tube at 2.6 cm above the carina. Slight pullback of the of the ETT is recommended by 1 cm. 3. No acute pulmonary disease. Electronically Signed   By: Ashley Royalty M.D.   On: 06/20/2017 16:07   Ir Percutaneous Art Thrombectomy/infusion Intracranial Inc Diag Angio  Result Date: 06/24/2017 INDICATION: Acute onset aphasia, left gaze deviation and right-sided weakness. Abnormal MRI MRA of the brain. EXAM: 1. EMERGENT LARGE VESSEL OCCLUSION THROMBOLYSIS (anterior CIRCULATION) COMPARISON:  CT of the brain of 06/20/2017, MRI MRA of the brain of 06/20/2017. MEDICATIONS: Vancomycin 1 g IV antibiotic was administered within 1 hour of the procedure. ANESTHESIA/SEDATION: General anesthesia. CONTRAST:  Isovue 300 approximately 80 cc. FLUOROSCOPY TIME:  Fluoroscopy Time: 48 minutes 42 seconds (1988 mGy). COMPLICATIONS: None immediate. TECHNIQUE: Following a full explanation of the procedure along with the potential associated complications, an informed witnessed consent was obtained from the patient's husband. The risks of intracranial hemorrhage of 10%, worsening neurological deficit, ventilator dependency, death and inability to revascularize were all reviewed in detail with the patient's husband. The patient was then put under general anesthesia by the Department of Anesthesiology at [facility]. The right groin was prepped and draped in the usual sterile fashion. Thereafter using modified Seldinger technique, transfemoral access into the right common femoral artery was obtained without  difficulty. Over a 0.035 inch guidewire a 5 French Pinnacle sheath was inserted. Through this, and also over a 0.035 inch guidewire a 5 [place] JB 1 catheter was advanced to the aortic arch region and selectively positioned in the the left common carotid artery. FINDINGS: The left common carotid arteriogram demonstrates the left external carotid artery and its major branches to be widely patent. The left internal carotid artery at the bulb to the cranial skull base opacifies normally. The petrous, cavernous and supraclinoid segments are widely patent. The left anterior cerebral artery is seen to opacify into the capillary and venous phases. The left middle cerebral artery and its distal half demonstrates a nearly occlusive filling defect with slow flow demonstrated distal to this. There is complete occlusion of the proximal superior division, and also the inferior division of the left middle cerebral artery in the M2 region. A left posterior communicating artery is seen opacifying the left posterior cerebral artery distribution. The delayed arterial phase demonstrates a large area of hypoperfusion involving the periinsular cortex  and the left parietal subcortical area. Transient cross opacification via the anterior communicating artery of the right anterior cerebral A2 segment and distally is noted. PROCEDURE: The diagnostic JB 1 catheter in the left common carotid artery was exchanged over a 0.035 inch 300 cm Rosen exchange guidewire for an 8 French 55 cm Brite tip neurovascular sheath using biplane roadmap technique and constant fluoroscopic guidance. Good aspiration obtained from the side port of the neurovascular sheath. This was then connected to continuous heparinized saline infusion. Over the [employer] guidewire, an 8 [place] 85 cm FlowGate balloon guide catheter which had been prepped with 50% contrast and 50% heparinized saline infusion was advanced to just proximal to the left common carotid  bifurcation. The guidewire was removed. Good aspiration obtained from the hub of the 8 [place] FlowGate guide catheter. Over a 0.035 inch Roadrunner guidewire, using biplane roadmap technique, the 8 [place] FlowGate guide catheter was then advanced to the distal cervical left ICA. The guidewire was removed. Good aspiration obtained from the hub of the 8 [place] FlowGate guide catheter. A gentle contrast injection demonstrated no evidence of spasms, dissections or of intraluminal filling defects. No change was noted in the intracranial circulation. At this time, in a coaxial manner and with constant heparinized saline infusion using biplane roadmap technique and constant fluoroscopic guidance, a combination of a Trevo ProVue 021 microcatheter inside of a 5 French 115 cm Catalyst guide catheter was advanced over a 0.014 inch Softip Synchro micro guidewire to the supraclinoid left ICA. The micro guidewire was then gently advanced into the M1 segment followed by the microcatheter into the inferior division of the left middle cerebral artery. This was in the M2 M3 region. The guidewire was removed. Good aspiration obtained from the hub of the microcatheter. Gentle contrast injection demonstrated slow antegrade flow. At this time, a 4 mm x 40 mm Solitaire FR retrieval device which had been purged with heparinized saline infusion was advanced to the distal end of the microcatheter. The proximal and the distal markers were then aligned appropriately. The O ring on the delivery micro catheter was loosened. With slight forward gentle traction with the right hand on the delivery micro guidewire, with the left hand, the delivery microcatheter was then retrieved unsheathing the retrieval device. A gentle control arteriogram performed through the 5 [place] Catalyst guide catheter in the left internal carotid artery demonstrated slow flow through the retrieval device. The Catalyst guide catheter was then advanced into the proximal M1  region. The proximal portion of the retrieval device was then captured into the microcatheter. Thereafter the combination of the retrieval device, the microcatheter and the 5 [place] Catalyst guide catheter were gently retrieved and removed following proximal flow arrest in the left internal carotid artery by inflating the balloon at the tip of the [facility] guide catheter. Aspiration with a 60 mL syringe at the hub of the [facility] guide catheter, and a 20 mL syringe at the hub of the Catalyst guide catheter was continued as the combination was retrieved and removed. There were only a few specks of clots noted in the aspirate. None were observed on the interstices of the retrieval device. Aspiration was continued as the balloon was deflated in the left internal carotid artery. A control arteriogram performed through the [facility] guide catheter in the left internal carotid artery demonstrated improved flow through the inferior division of the left middle cerebral artery. However, slow flow was noted in its distal M3 M4 region. Contrast was noted in the  tiny clot noted in the M3 M4 region. This was deemed too distal for safe retrieval. As described above, access was now obtained into the superior division of the left middle cerebral artery M2 M3 region using a microcatheter, and a 5 [place] Catalyst guide catheter combination over a 0.014 inch Softip Synchro micro guidewire. The distal end of the microcatheter was positioned in the M2 M3 region. Good aspiration obtained from the hub of the microcatheter. This was then connected to continuous heparinized saline infusion. A 4 mm x 40 mm Solitaire FR retrieval device was then advanced as described above to the distal end of the microcatheter. The distal and the proximal markers were then aligned appropriately prior to the deployment of the retrieval device. Again the proximal portion of the retrieval device was captured with the guide catheter now advanced into the distal  M1 segment. With proximal flow arrest by inflating the balloon in the left internal carotid artery, and with constant aspiration at the hub of the 8 [place] FlowGate guide catheter hub, and the hub of the 5 [place] Catalyst guide catheter, the combination of the retrieval device, the microcatheter, and also of the 5 [place] guide catheter was retrieved slowly and deliberately. Aspiration was continued as the balloon was deflated in the left internal carotid artery. At this time, the aspirate contained two large pieces of shiny blood-stained clot. Free back bleed at the hub of the 8 [place] FlowGate guide catheter was noted. A control arteriogram performed through the [facility] guide catheter in the left internal carotid artery demonstrated complete revascularization of the superior division. The previously noted filling defect in the M3 M4 division of the inferior division had moved slightly further distally. A final control arteriogram performed through the [facility] guide catheter in the left internal carotid artery demonstrated a reperfusion of the left middle cerebral artery distribution, the anterior cerebral artery and the posterior communicating artery remained widely patent. Throughout the procedure, the patient's blood pressure and neurological status remained stable. No angiographic evidence of extravasation or of abnormal mass-effect was seen. The 8 [place] FlowGate guide catheter and the 8 French neurovascular sheath were then exchanged over a J-tip guidewire for a 9 [place] Pinnacle sheath. This was then connected to continuous heparinized saline infusion. IMPRESSION: IMPRESSION Status post endovascular revascularization of occluded left middle cerebral artery distally, and the superior and inferior divisions with 2 passes with the Solitaire FR 4 mm x 40 mm retrieval device achieving a TICI 2b reperfusion. PLAN: Patient transported to the CT scanner for postprocedural CT scan of the brain. Electronically Signed    By: Luanne Bras M.D.   On: 06/22/2017 18:38   Ct Head Code Stroke Wo Contrast  Result Date: 06/20/2017 CLINICAL DATA:  Code stroke. Change in mental status while in the MRI scanner. EXAM: CT HEAD WITHOUT CONTRAST TECHNIQUE: Contiguous axial images were obtained from the base of the skull through the vertex without intravenous contrast. COMPARISON:  None. FINDINGS: Brain: The study is moderately degraded by patient motion. There is no significant change in atrophy white matter disease. No new cortical lesions are present. Asymmetric white matter hypoattenuation is present in the left corona radiata. Vascular: No definite hyperdense vessel. Skull: Calvarium is intact. Right periorbital soft tissue hematoma has not changed significantly. Sinuses/Orbits: The paranasal sinuses and mastoid air cells are clear. Globes and orbits are otherwise unremarkable. ASPECTS [facility] Stroke Program Early CT Score) - Ganglionic level infarction (caudate, lentiform nuclei, internal capsule, insula, M1-M3 cortex): 7/7 - Supraganglionic infarction (  M4-M6 cortex): 3/3 Total score (0-10 with 10 being normal): 10/10 IMPRESSION: 1. No significant interval change. 2. Stable atrophy and white matter disease. 3. No definite hyperdense vessel. 4. Stable right periorbital hematoma. 5. ASPECTS is 10/10 These results were called by telephone at the time of interpretation on 06/20/2017 at 10:05 am to Dr. Roland Rack , who verbally acknowledged these results. Electronically Signed   By: San Morelle M.D.   On: 06/20/2017 10:05   Ct Head Code Stroke Wo Contrast  Result Date: 06/20/2017 CLINICAL DATA:  Code stroke. Acute onset of right-sided weakness and right facial droop with slurred speech. Fall. Right periorbital hematoma. EXAM: CT HEAD WITHOUT CONTRAST TECHNIQUE: Contiguous axial images were obtained from the base of the skull through the vertex without intravenous contrast. COMPARISON:  CT head without contrast  01/27/2013 FINDINGS: Brain: Mild atrophy and white matter changes are present bilaterally. The basal ganglia are intact. Insular ribbon is normal bilaterally. No acute or focal cortical abnormality is present. Vascular: Atherosclerotic calcifications are present within the cavernous internal carotid artery is bilaterally. There is no hyperdense vessel. Skull: The calvarium is intact. No focal lytic or blastic lesions are present. Sinuses/Orbits: The paranasal sinuses and mastoid air cells are clear. The patient is status post bilateral lens replacements. May prominent right infraorbital hematoma is present without an underlying fracture. Zygomatic arch and orbital rim are intact. ASPECTS [facility] Stroke Program Early CT Score) - Ganglionic level infarction (caudate, lentiform nuclei, internal capsule, insula, M1-M3 cortex): 7/7 - Supraganglionic infarction (M4-M6 cortex): 3/3 Total score (0-10 with 10 being normal): 10/10 IMPRESSION: 1. Stable atrophy and white matter disease. 2. No acute intracranial abnormality or significant interval change. 3. Prominent right infraorbital and lateral soft tissue hematoma without underlying fracture or intraorbital extension. 4. ASPECTS is 10/10 These results were called by telephone at the time of interpretation on 06/20/2017 at 8:28 am to Dr. Leonel Ramsay, who verbally acknowledged these results. Electronically Signed   By: San Morelle M.D.   On: 06/20/2017 08:35   Ct Maxillofacial Wo Contrast  Result Date: 06/20/2017 CLINICAL DATA:  Right periorbital hematoma after falling and hitting the right side of her face. EXAM: CT MAXILLOFACIAL WITHOUT CONTRAST TECHNIQUE: Multidetector CT imaging of the maxillofacial structures was performed. Multiplanar CT image reconstructions were also generated. COMPARISON:  Head CT obtained earlier today. FINDINGS: Osseous: No fractures. Orbits: Status post bilateral cataract extraction. Sinuses: Normally pneumatized. Soft tissues: Right  infraorbital and periorbital hematoma. Limited intracranial: Per the head CT report earlier today. IMPRESSION: 1. No maxillofacial fracture. 2. Right infraorbital and periorbital soft tissue hematoma. Electronically Signed   By: Claudie Revering M.D.   On: 06/20/2017 09:02   Ir Angio Intra Extracran Sel Com Carotid Innominate Uni R Mod Sed  Result Date: 06/24/2017 INDICATION: Acute onset aphasia, left gaze deviation and right-sided weakness. Abnormal MRI MRA of the brain. EXAM: 1. EMERGENT LARGE VESSEL OCCLUSION THROMBOLYSIS (anterior CIRCULATION) COMPARISON:  CT of the brain of 06/20/2017, MRI MRA of the brain of 06/20/2017. MEDICATIONS: Vancomycin 1 g IV antibiotic was administered within 1 hour of the procedure. ANESTHESIA/SEDATION: General anesthesia. CONTRAST:  Isovue 300 approximately 80 cc. FLUOROSCOPY TIME:  Fluoroscopy Time: 48 minutes 42 seconds (1988 mGy). COMPLICATIONS: None immediate. TECHNIQUE: Following a full explanation of the procedure along with the potential associated complications, an informed witnessed consent was obtained from the patient's husband. The risks of intracranial hemorrhage of 10%, worsening neurological deficit, ventilator dependency, death and inability to revascularize were all reviewed in detail  with the patient's husband. The patient was then put under general anesthesia by the Department of Anesthesiology at [facility]. The right groin was prepped and draped in the usual sterile fashion. Thereafter using modified Seldinger technique, transfemoral access into the right common femoral artery was obtained without difficulty. Over a 0.035 inch guidewire a 5 French Pinnacle sheath was inserted. Through this, and also over a 0.035 inch guidewire a 5 [place] JB 1 catheter was advanced to the aortic arch region and selectively positioned in the the left common carotid artery. FINDINGS: The left common carotid arteriogram demonstrates the left external carotid artery and its  major branches to be widely patent. The left internal carotid artery at the bulb to the cranial skull base opacifies normally. The petrous, cavernous and supraclinoid segments are widely patent. The left anterior cerebral artery is seen to opacify into the capillary and venous phases. The left middle cerebral artery and its distal half demonstrates a nearly occlusive filling defect with slow flow demonstrated distal to this. There is complete occlusion of the proximal superior division, and also the inferior division of the left middle cerebral artery in the M2 region. A left posterior communicating artery is seen opacifying the left posterior cerebral artery distribution. The delayed arterial phase demonstrates a large area of hypoperfusion involving the periinsular cortex and the left parietal subcortical area. Transient cross opacification via the anterior communicating artery of the right anterior cerebral A2 segment and distally is noted. PROCEDURE: The diagnostic JB 1 catheter in the left common carotid artery was exchanged over a 0.035 inch 300 cm Rosen exchange guidewire for an 8 [place] 55 cm Brite tip neurovascular sheath using biplane roadmap technique and constant fluoroscopic guidance. Good aspiration obtained from the side port of the neurovascular sheath. This was then connected to continuous heparinized saline infusion. Over the [employer] guidewire, an 8 [place] 85 cm FlowGate balloon guide catheter which had been prepped with 50% contrast and 50% heparinized saline infusion was advanced to just proximal to the left common carotid bifurcation. The guidewire was removed. Good aspiration obtained from the hub of the 8 [place] FlowGate guide catheter. Over a 0.035 inch Roadrunner guidewire, using biplane roadmap technique, the 8 [place] FlowGate guide catheter was then advanced to the distal cervical left ICA. The guidewire was removed. Good aspiration obtained from the hub of the 8 [place] FlowGate  guide catheter. A gentle contrast injection demonstrated no evidence of spasms, dissections or of intraluminal filling defects. No change was noted in the intracranial circulation. At this time, in a coaxial manner and with constant heparinized saline infusion using biplane roadmap technique and constant fluoroscopic guidance, a combination of a Trevo ProVue 021 microcatheter inside of a 5 [place] 115 cm Catalyst guide catheter was advanced over a 0.014 inch Softip Synchro micro guidewire to the supraclinoid left ICA. The micro guidewire was then gently advanced into the M1 segment followed by the microcatheter into the inferior division of the left middle cerebral artery. This was in the M2 M3 region. The guidewire was removed. Good aspiration obtained from the hub of the microcatheter. Gentle contrast injection demonstrated slow antegrade flow. At this time, a 4 mm x 40 mm Solitaire FR retrieval device which had been purged with heparinized saline infusion was advanced to the distal end of the microcatheter. The proximal and the distal markers were then aligned appropriately. The O ring on the delivery micro catheter was loosened. With slight forward gentle traction with the right hand on  the delivery micro guidewire, with the left hand, the delivery microcatheter was then retrieved unsheathing the retrieval device. A gentle control arteriogram performed through the 5 [place] Catalyst guide catheter in the left internal carotid artery demonstrated slow flow through the retrieval device. The Catalyst guide catheter was then advanced into the proximal M1 region. The proximal portion of the retrieval device was then captured into the microcatheter. Thereafter the combination of the retrieval device, the microcatheter and the 5 [place] Catalyst guide catheter were gently retrieved and removed following proximal flow arrest in the left internal carotid artery by inflating the balloon at the tip of the [facility] guide  catheter. Aspiration with a 60 mL syringe at the hub of the [facility] guide catheter, and a 20 mL syringe at the hub of the Catalyst guide catheter was continued as the combination was retrieved and removed. There were only a few specks of clots noted in the aspirate. None were observed on the interstices of the retrieval device. Aspiration was continued as the balloon was deflated in the left internal carotid artery. A control arteriogram performed through the [facility] guide catheter in the left internal carotid artery demonstrated improved flow through the inferior division of the left middle cerebral artery. However, slow flow was noted in its distal M3 M4 region. Contrast was noted in the tiny clot noted in the M3 M4 region. This was deemed too distal for safe retrieval. As described above, access was now obtained into the superior division of the left middle cerebral artery M2 M3 region using a microcatheter, and a 5 [place] Catalyst guide catheter combination over a 0.014 inch Softip Synchro micro guidewire. The distal end of the microcatheter was positioned in the M2 M3 region. Good aspiration obtained from the hub of the microcatheter. This was then connected to continuous heparinized saline infusion. A 4 mm x 40 mm Solitaire FR retrieval device was then advanced as described above to the distal end of the microcatheter. The distal and the proximal markers were then aligned appropriately prior to the deployment of the retrieval device. Again the proximal portion of the retrieval device was captured with the guide catheter now advanced into the distal M1 segment. With proximal flow arrest by inflating the balloon in the left internal carotid artery, and with constant aspiration at the hub of the 8 [place] FlowGate guide catheter hub, and the hub of the 5 [place] Catalyst guide catheter, the combination of the retrieval device, the microcatheter, and also of the 5 [place] guide catheter was retrieved slowly and  deliberately. Aspiration was continued as the balloon was deflated in the left internal carotid artery. At this time, the aspirate contained two large pieces of shiny blood-stained clot. Free back bleed at the hub of the 8 [place] FlowGate guide catheter was noted. A control arteriogram performed through the [facility] guide catheter in the left internal carotid artery demonstrated complete revascularization of the superior division. The previously noted filling defect in the M3 M4 division of the inferior division had moved slightly further distally. A final control arteriogram performed through the [facility] guide catheter in the left internal carotid artery demonstrated a reperfusion of the left middle cerebral artery distribution, the anterior cerebral artery and the posterior communicating artery remained widely patent. Throughout the procedure, the patient's blood pressure and neurological status remained stable. No angiographic evidence of extravasation or of abnormal mass-effect was seen. The 8 [place] FlowGate guide catheter and the 8 French neurovascular sheath were then exchanged over a J-tip guidewire  for a 9 [place] Pinnacle sheath. This was then connected to continuous heparinized saline infusion. IMPRESSION: IMPRESSION Status post endovascular revascularization of occluded left middle cerebral artery distally, and the superior and inferior divisions with 2 passes with the Solitaire FR 4 mm x 40 mm retrieval device achieving a TICI 2b reperfusion. PLAN: Patient transported to the CT scanner for postprocedural CT scan of the brain. Electronically Signed   By: Luanne Bras M.D.   On: 06/22/2017 18:38    Assessment/Plan 1. Acute ischemic stroke (HCC) - acute/subacute L MCA infarcts and right MCA punctate infarcts, embolic, likely due to PAF not on AC, s/p tPA and mechanical thrombectomy of left MCA with TICI 2b reperfusion -with residual right hemiparesis, facial droop and aphasia -cont PT, OT, ST    -cont secondary stroke prevention with eliquis for afib, bp control, lipid control -had at least MCI pre-stroke with difficulty managing meds, appts  2. CAD S/P percutaneous coronary angioplasty -- PCI to LAD in setting Ant STEMI; 2 overlapping Promx DES 2.5 x 38, 2.5 x 12 (postdilated to ~3 mm) -cont statin, beta blocker, prn ntg  3. Pseudoaneurysm of femoral artery (HCC) -s/p surgical correction -now with dehiscence of wound--needs outpatient f/u with vascular surgery -no sign of infection (not clear why wound cultured)--pt also in isolation in case  4. Chronic systolic heart failure (HCC) -cont lasix 54m po daily, monitor bmp and weights  -intake not impressive at this time so doubt she'll become volume overloaded  5. Paroxysmal atrial fibrillation (HCC) -in afib at this time, cont eliquis anticoagulation and coreg for rate control  6. Acute pulmonary edema (HCC) -resolved with diuresis, also got levaquin for possible pneumonia during hospitalization and required pressor support during this time--was intubated on 3 separate occasions (twice for procedures and once for acute pulmonary edema)--code status has since changed to DNR/DNI and a more palliative approach   7. Hypothyroidism, unspecified type -cont levothyroxine at 1069m q am and monitor TSH  8. Obesity (BMI 30.0-34.9) -ongoing, has lost weight during hospitalization and expect this may continue with her modified diet due to dysphagia  9. Polypharmacy -remains on multiple meds and having dysphagia, cognitively declined and aphasia severe -will stop vitamin E and biotin, also might consider d/c allegra in future  10. 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 FiOneida Alare: right groin wound  Nakeysha Pasqual L. Kourtlyn Charlet, D.O. [facility] [address] [id number] Phone (Mon-Fri 8am-5pm):  [phone] Call:  [phone] follow prompts after 5pm & weekends Office Phone:  [phone] Fax:  [phone]

## 2017-07-07 DIAGNOSIS — I69398 Other sequelae of cerebral infarction: Secondary | ICD-10-CM | POA: Diagnosis not present

## 2017-07-07 DIAGNOSIS — I69320 Aphasia following cerebral infarction: Secondary | ICD-10-CM | POA: Diagnosis not present

## 2017-07-07 DIAGNOSIS — I69393 Ataxia following cerebral infarction: Secondary | ICD-10-CM | POA: Diagnosis not present

## 2017-07-07 DIAGNOSIS — I69391 Dysphagia following cerebral infarction: Secondary | ICD-10-CM | POA: Diagnosis not present

## 2017-07-07 DIAGNOSIS — I69851 Hemiplegia and hemiparesis following other cerebrovascular disease affecting right dominant side: Secondary | ICD-10-CM | POA: Diagnosis not present

## 2017-07-07 DIAGNOSIS — I69311 Memory deficit following cerebral infarction: Secondary | ICD-10-CM | POA: Diagnosis not present

## 2017-07-08 ENCOUNTER — Telehealth (HOSPITAL_COMMUNITY): Payer: Self-pay

## 2017-07-08 DIAGNOSIS — I69391 Dysphagia following cerebral infarction: Secondary | ICD-10-CM | POA: Diagnosis not present

## 2017-07-08 DIAGNOSIS — I69851 Hemiplegia and hemiparesis following other cerebrovascular disease affecting right dominant side: Secondary | ICD-10-CM | POA: Diagnosis not present

## 2017-07-08 DIAGNOSIS — I69398 Other sequelae of cerebral infarction: Secondary | ICD-10-CM | POA: Diagnosis not present

## 2017-07-08 DIAGNOSIS — I69311 Memory deficit following cerebral infarction: Secondary | ICD-10-CM | POA: Diagnosis not present

## 2017-07-08 DIAGNOSIS — I69320 Aphasia following cerebral infarction: Secondary | ICD-10-CM | POA: Diagnosis not present

## 2017-07-08 DIAGNOSIS — I69393 Ataxia following cerebral infarction: Secondary | ICD-10-CM | POA: Diagnosis not present

## 2017-07-08 NOTE — Telephone Encounter (Signed)
Called to schedule stroke f/u, left message for pt to return call. AW 

## 2017-07-09 ENCOUNTER — Telehealth (HOSPITAL_COMMUNITY): Payer: Self-pay

## 2017-07-09 DIAGNOSIS — I69320 Aphasia following cerebral infarction: Secondary | ICD-10-CM | POA: Diagnosis not present

## 2017-07-09 DIAGNOSIS — I69391 Dysphagia following cerebral infarction: Secondary | ICD-10-CM | POA: Diagnosis not present

## 2017-07-09 DIAGNOSIS — I69851 Hemiplegia and hemiparesis following other cerebrovascular disease affecting right dominant side: Secondary | ICD-10-CM | POA: Diagnosis not present

## 2017-07-09 DIAGNOSIS — I69398 Other sequelae of cerebral infarction: Secondary | ICD-10-CM | POA: Diagnosis not present

## 2017-07-09 DIAGNOSIS — I69393 Ataxia following cerebral infarction: Secondary | ICD-10-CM | POA: Diagnosis not present

## 2017-07-09 DIAGNOSIS — I69311 Memory deficit following cerebral infarction: Secondary | ICD-10-CM | POA: Diagnosis not present

## 2017-07-09 NOTE — Telephone Encounter (Signed)
Husband returned call to schedule f/u. Informed me that pt was in rehab at the moment. He did state the pt was making great progress. He would have to transport by ambulance to get her here. He will call as soon as she is done with rehab to schedule f/u. AW

## 2017-07-11 DIAGNOSIS — I69320 Aphasia following cerebral infarction: Secondary | ICD-10-CM | POA: Diagnosis not present

## 2017-07-11 DIAGNOSIS — I69398 Other sequelae of cerebral infarction: Secondary | ICD-10-CM | POA: Diagnosis not present

## 2017-07-11 DIAGNOSIS — I69311 Memory deficit following cerebral infarction: Secondary | ICD-10-CM | POA: Diagnosis not present

## 2017-07-11 DIAGNOSIS — I69851 Hemiplegia and hemiparesis following other cerebrovascular disease affecting right dominant side: Secondary | ICD-10-CM | POA: Diagnosis not present

## 2017-07-11 DIAGNOSIS — I69393 Ataxia following cerebral infarction: Secondary | ICD-10-CM | POA: Diagnosis not present

## 2017-07-11 DIAGNOSIS — I69391 Dysphagia following cerebral infarction: Secondary | ICD-10-CM | POA: Diagnosis not present

## 2017-07-12 ENCOUNTER — Encounter: Payer: Self-pay | Admitting: Adult Health

## 2017-07-12 ENCOUNTER — Non-Acute Institutional Stay (SKILLED_NURSING_FACILITY): Payer: Medicare Other | Admitting: Adult Health

## 2017-07-12 DIAGNOSIS — L089 Local infection of the skin and subcutaneous tissue, unspecified: Secondary | ICD-10-CM

## 2017-07-12 DIAGNOSIS — I69391 Dysphagia following cerebral infarction: Secondary | ICD-10-CM | POA: Diagnosis not present

## 2017-07-12 DIAGNOSIS — I69311 Memory deficit following cerebral infarction: Secondary | ICD-10-CM | POA: Diagnosis not present

## 2017-07-12 DIAGNOSIS — E440 Moderate protein-calorie malnutrition: Secondary | ICD-10-CM | POA: Diagnosis not present

## 2017-07-12 DIAGNOSIS — I69398 Other sequelae of cerebral infarction: Secondary | ICD-10-CM | POA: Diagnosis not present

## 2017-07-12 DIAGNOSIS — T148XXA Other injury of unspecified body region, initial encounter: Secondary | ICD-10-CM | POA: Diagnosis not present

## 2017-07-12 DIAGNOSIS — I69393 Ataxia following cerebral infarction: Secondary | ICD-10-CM | POA: Diagnosis not present

## 2017-07-12 DIAGNOSIS — I69851 Hemiplegia and hemiparesis following other cerebrovascular disease affecting right dominant side: Secondary | ICD-10-CM | POA: Diagnosis not present

## 2017-07-12 DIAGNOSIS — I69320 Aphasia following cerebral infarction: Secondary | ICD-10-CM | POA: Diagnosis not present

## 2017-07-12 NOTE — Progress Notes (Signed)
Location:  Occupational psychologist of Service:  SNF (31) Provider:   Cindi Carbon, ANP [place] [phone]   Gayland Curry, DO  Patient Care Team: Gayland Curry, DO as PCP - General (Geriatric Medicine) [place], [place] Judd Gaudier, Leonie Green, MD as Consulting Physician (Cardiology) Warden Fillers, MD as Consulting Physician (Ophthalmology) Belva Crome, MD as Consulting Physician (Cardiology)  Extended Emergency Contact Information Primary Emergency Contact: Ulice Dash Address: [address]          [place], [place] [address] Johnnette Litter of [place] Phone: [phone] Work Phone: [phone] Mobile Phone: [phone] Relation: Spouse Secondary Emergency Contact: Decoste,Blake Address: [address]          [place], [place] [address] Johnnette Litter of [place] Phone: [phone] Mobile Phone: [phone] Relation: Son  Code Status:  DNR Goals of care: Advanced Directive information Advanced Directives 07/06/2017  Does Patient Have a Medical Advance Directive? Yes  Type of Advance Directive Out of facility DNR (pink MOST or yellow form);Healthcare Power of Attorney  Does patient want to make changes to medical advance directive? -  Copy of [place] in Chart? Yes  Would patient like information on creating a medical advance directive? -  Pre-existing out of facility DNR order (yellow form or pink MOST form) Yellow form placed in chart (order not valid for inpatient use)     Chief Complaint  Patient presents with  . Acute Visit    right groin drainage    HPI:  Pt is a 83 y.o. female seen today for an acute visit for drainage and foul odor from a wound to the groin. Ms. Standiford was admitted to rehab at [facility] after a complicated hospital stay due to acute/subacture left MCA infarcts and right MCA punctate infarcts.  During her stay a cerebral angiogram as done via the right femoral  and later a pseudoaneurysm of the right groin was repaired on 9/5.  The wound opened and started with small amt of drainage upon admission.  A wound culture/dna analysis was obtained which showed no acute infection, only skin contaminant. As of today the nurse is reporting large amts of copious, foul smelling drainage, redness, developing necrotic tissue, and increasing wound size. She is in her usual state of health otherwise for my visit with stable VS.     Past Medical History:  Diagnosis Date  . Anemia   . CAD (coronary artery disease)    a. STEMI 10/09/13 - 100% LAD - following initial angioplasty, ? Spontaneous dissection vs related to ballon. s/p complex PCI with 2 overlapping DES to LAD. Adm complicated by VDRF, cardiogenic shock, peri-MI PAF with NSR on amiodarone. Residual mod RCA disease.   Marland Kitchen CAD S/P percutaneous coronary angioplasty 10/09/13   DES x 2 to Mid LAD, 2.5 x 38, 2.5 x 12 Promus (covering extensive disection), moderate RCA disesase  . Cardiogenic shock ([place])    a. 09/2013 - during admission for STEMI.  . Depression   . Dyslipidemia   . H/O Acute respiratory failure due to hypoxia 10/10/2013   a. 09/2013 - secondary to anterior STEMI related pulmonary edema (resolved).  . Hypertension   . Hypothyroidism   . Ischemic cardiomyopathy    a. EF 30-35% during 09/2013 adm for STEMI. b. Repeat Echo EF 40-45% with distal LAD hypokinesis (10/16/14 following STEMI recovery).  . Migraine headache    a. Remotely.  . Orthostatic hypotension    a. Adm  10/2013 for this, felt due to Lasix. Not on regular diuretic due to this/dehydration.  Marland Kitchen PAF (paroxysmal atrial fibrillation) ([place])    a. 09/2013 - peri-MI, NSR at discharge on amiodarone.  . Sinus bradycardia    Past Surgical History:  Procedure Laterality Date  . CORONARY ANGIOPLASTY WITH STENT PLACEMENT  10/09/13   2 overlapping Promus DES to mid LAD, occlusion/dissection (2.5 x 38, 2.5 x 12)  . FEMORAL ARTERY EXPLORATION Right  06/23/2017   Procedure: repair right femoral pseudoanerysm; evacuation hemotoma;  Surgeon: Elam Dutch, MD;  Location: [facility] OR;  Service: Vascular;  Laterality: Right;  . IR ANGIO INTRA EXTRACRAN SEL COM CAROTID INNOMINATE UNI R MOD SED  06/20/2017  . IR PERCUTANEOUS ART THROMBECTOMY/INFUSION INTRACRANIAL INC DIAG ANGIO  06/20/2017  . IR US GUIDE BX ASP/DRAIN  06/23/2017  . LEFT HEART CATHETERIZATION WITH CORONARY ANGIOGRAM N/A 10/09/2013   Procedure: LEFT HEART CATHETERIZATION WITH CORONARY ANGIOGRAM;  Surgeon: Leonie Man, MD;  Location: [facility] CATH LAB;  Service: Cardiovascular;  Laterality: N/A;  . RADIOLOGY WITH ANESTHESIA N/A 06/20/2017   Procedure: RADIOLOGY WITH ANESTHESIA;  Surgeon: Luanne Bras, MD;  Location: [facility];  Service: Radiology;  Laterality: N/A;  . SHOULDER SURGERY      Allergies  Allergen Reactions  . Ace Inhibitors Anaphylaxis  . Beta Adrenergic Blockers Anaphylaxis  . Iohexol Anaphylaxis     Code: HIVES, Desc: anaphylactic shock s/p contrast injection many yrs ago--suggested that pt NEVER have iv contrast//a.c., Onset Date: [id number]   . Atorvastatin Rash  . Latex Rash  . Penicillins Swelling and Rash    Details are unknown  . Sulfa Antibiotics Rash    Outpatient Encounter Prescriptions as of 07/12/2017  Medication Sig  . acetaminophen (TYLENOL) 325 MG tablet Take 650 mg by mouth every 6 (six) hours as needed for mild pain.  Marland Kitchen ALPRAZolam (XANAX) 0.25 MG tablet TAKE HALF TABLET (0.125mg ) TWICE DAILY AS NEEDED FOR ANXIETY.  Marland Kitchen apixaban (ELIQUIS) 5 MG TABS tablet Take 1 tablet (5 mg total) by mouth 2 (two) times daily.  Marland Kitchen aspirin 81 MG chewable tablet Chew 1 tablet (81 mg total) by mouth daily.  . Biotin 1000 MCG tablet Take 1,000 mcg by mouth daily.  Marland Kitchen buPROPion (WELLBUTRIN SR) 150 MG 12 hr tablet Take 150 mg by mouth every morning.   . calcium-vitamin D (OSCAL WITH D) 500-200 MG-UNIT per tablet Take 1 tablet by mouth daily.   . carvedilol (COREG) 6.25 MG tablet  Take 1 tablet (6.25 mg total) by mouth 2 (two) times daily with a meal.  . cholecalciferol (VITAMIN D) 1000 UNITS tablet Take 1,000 Units by mouth daily.  . clobetasol ointment (TEMOVATE) 4.23 % Apply 1 application topically 2 (two) times daily as needed. (affected areas)--also thighs  . fexofenadine (ALLEGRA) 60 MG tablet Take 60 mg by mouth 2 (two) times daily as needed for allergies or rhinitis.  . furosemide (LASIX) 20 MG tablet Take 0.5 tablets (10 mg total) by mouth daily.  . montelukast (SINGULAIR) 10 MG tablet Take 10 mg by mouth at bedtime.  . Multiple Vitamin (MULTIVITAMIN WITH MINERALS) TABS tablet Take 1 tablet by mouth daily.  . nitroGLYCERIN (NITROSTAT) 0.4 MG SL tablet Place 0.4 mg under the tongue every 5 (five) minutes as needed for chest pain.  Marland Kitchen omeprazole (PRILOSEC) 20 MG capsule Take 20 mg by mouth daily.   [name] Glycol-Propyl Glycol (SYSTANE OP) Apply 1 drop to eye daily as needed (dry eyes).  . rosuvastatin (CRESTOR)  10 MG tablet Take 1 tablet (10 mg total) by mouth 3 (three) times a week. (Mondays, Wednesdays, and Fridays)  . sertraline (ZOLOFT) 100 MG tablet Take 100 mg by mouth daily.   Marland Kitchen SYNTHROID 100 MCG tablet TAKE 1 TABLET (160mcg) IN THE MORNING ON AN EMPTY STOMACH.  . vitamin E (VITAMIN E) 400 UNIT capsule Take 400 Units by mouth daily.   Facility-Administered Encounter Medications as of 07/12/2017  Medication  . etomidate (AMIDATE) injection  . rocuronium (ZEMURON) injection  . succinylcholine (ANECTINE) injection    Review of Systems  Constitutional: Negative for activity change, appetite change, chills, diaphoresis, fatigue, fever and unexpected weight change.  HENT: Positive for trouble swallowing.   Respiratory: Negative for cough, shortness of breath and wheezing.   Cardiovascular: Negative for chest pain and leg swelling.  Gastrointestinal: Negative for abdominal distention, constipation and diarrhea.  Genitourinary: Negative for dysuria.    Musculoskeletal: Positive for gait problem.  Skin: Positive for color change and wound.  Neurological: Positive for facial asymmetry and weakness. Negative for dizziness, tremors and syncope.  Psychiatric/Behavioral: Positive for confusion. Negative for agitation and behavioral problems.    Immunization History  Administered Date(s) Administered  . Influenza Split 08/06/2010, 08/05/2011, 07/26/2012  . Influenza,inj,Quad PF,6+ Mos 08/20/2016  . Pneumococcal Conjugate-13 04/04/2014   Pertinent  Health Maintenance Due  Topic Date Due  . DEXA SCAN  10/16/1997  . PNA vac Low Risk Adult (2 of 2 - PPSV23) 04/05/2015  . INFLUENZA VACCINE  05/19/2017   Fall Risk  01/20/2017 09/16/2016 07/29/2016  Falls in the past year? No No No   Functional Status Survey:    Vitals:   07/12/17 1524  BP: 123/73  Pulse: 71  Resp: 20  Temp: 97.8 F (36.6 C)   There is no height or weight on file to calculate BMI. Physical Exam  Constitutional: No distress.  Cardiovascular: Normal rate and regular rhythm.   No murmur heard. Pulmonary/Chest: Effort normal and breath sounds normal.  Abdominal: Soft. Bowel sounds are normal. She exhibits no distension.  Neurological: She is alert.  Skin: Skin is warm and dry. She is not diaphoretic. There is erythema.  Right groin wound with 90% pink tissue and 10% yellow slough, small amt of necrotic tissue noted to the edge of the wound. Large amt of yellow/red drainage with an odor.  Significant ecchymoses to the right hip and right side of her face.  Psychiatric: She has a normal mood and affect.    Labs reviewed:  Recent Labs  06/27/17 1725 06/28/17 0510 06/28/17 1745 06/29/17 0431 06/30/17 0413 07/02/17 0651 07/03/17 0257  NA 140 142  --  140 141 141 145  K 2.8* 3.5  --  3.5 3.4* 3.0* 3.0*  CL 104 107  --  107 103 103 104  CO2 28 30  --  29 33* 30 33*  GLUCOSE 104* 142*  --  139* 115* 146* 127*  BUN 28* 34*  --  33* 20 29* 35*  CREATININE 0.55  0.63  --  0.62 0.69 0.82 0.86  CALCIUM 8.7* 9.1  --  9.3 9.5 10.1 10.5*  MG 2.1 1.8  --   --  1.6*  --   --   PHOS 1.9* 1.6* 3.5 2.1* 2.8  --   --     Recent Labs  01/26/17 0855 06/20/17 0819 06/21/17 0349 06/24/17 0410  AST 23 26  --   --   ALT 20 23  --   --  ALKPHOS 84 72  --   --   BILITOT 0.5 0.6  --   --   PROT 6.7 6.7  --   --   ALBUMIN 4.2 3.9 3.0* 2.7*    Recent Labs  06/21/17 0349  06/27/17 0445 06/28/17 0510 06/29/17 0431 07/02/17 0651 07/03/17 0257  WBC 13.8*  < > 10.0 9.1 9.2 9.4 10.9*  NEUTROABS 11.1*  --  6.9 6.1  --   --   --   HGB 11.9*  < > 8.1* 8.2* 8.1* 11.0* 10.9*  HCT 37.4  < > 24.9* 26.2* 26.3* 35.1* 35.1*  MCV 94.7  < > 92.6 93.9 95.6 94.9 95.9  PLT 174  < > 172 196 201 256 280  < > = values in this interval not displayed. Lab Results  Component Value Date   TSH 2.03 05/11/2016   Lab Results  Component Value Date   HGBA1C 5.4 06/21/2017   Lab Results  Component Value Date   CHOL 133 06/21/2017   HDL 45 06/21/2017   LDLCALC 64 06/21/2017   TRIG 99 06/26/2017   CHOLHDL 3.0 06/21/2017    Significant Diagnostic Results in last 30 days:  Dg Abd 1 View  Result Date: 06/23/2017 CLINICAL DATA:  Central venous catheter in place. Chest tube placement. Feeding tube placement. EXAM: ABDOMEN - 1 VIEW COMPARISON:  06/14/2014 FINDINGS: An enteric tube has been placed with tip in the left mid abdomen consistent with location in the distal body of the stomach. Scattered gas and stool in the colon. No small or large bowel distention. Calcifications in the right upper quadrant likely representing gallstones. Degenerative changes in the lumbar spine. IMPRESSION: Enteric tube tip localizes to the left mid abdomen consistent with location in the distal body of the stomach. Cholelithiasis. Nonobstructive bowel gas pattern. Electronically Signed   By: Lucienne Capers M.D.   On: 06/23/2017 22:52   Ct Head Wo Contrast  Result Date: 06/22/2017 CLINICAL DATA:   Altered level consciousness.  Followup infarcts. EXAM: CT HEAD WITHOUT CONTRAST TECHNIQUE: Contiguous axial images were obtained from the base of the skull through the vertex without intravenous contrast. COMPARISON:  MRI of the head June 21, 2017 and CT HEAD June 21, 2017. FINDINGS: BRAIN: No intraparenchymal hemorrhage, mass effect nor midline shift. Evolving LEFT frontotemporal and parietal lobe nonhemorrhagic infarct with mild sulcal effacement. No midline shift. Patchy hypodensities LEFT basal ganglia corresponding areas of acute infarction. No hydrocephalus. No abnormal extra-axial fluid collections. VASCULAR: Mild calcific atherosclerosis of the carotid siphons. SKULL: No skull fracture. No significant scalp soft tissue swelling. SINUSES/ORBITS: Trace paranasal sinus mucosal thickening. Imaged mastoid air cells are well aerated. The included ocular globes and orbital contents are non-suspicious. Status post bilateral ocular lens implants. OTHER: RIGHT facial soft tissue swelling with subcutaneous fat stranding compatible with contusion, present on prior imaging. IMPRESSION: Evolving acute to subacute large LEFT MCA territory infarct without hemorrhagic conversion. No further propagation by CT. Electronically Signed   By: Elon Alas M.D.   On: 06/22/2017 21:38   Ct Head Wo Contrast  Result Date: 06/21/2017 CLINICAL DATA:  83 y/o  F; stroke for follow-up. EXAM: CT HEAD WITHOUT CONTRAST TECHNIQUE: Contiguous axial images were obtained from the base of the skull through the vertex without intravenous contrast. COMPARISON:  06/20/2017 CT head FINDINGS: Brain: Hypoattenuation within the left insula, left superior lentiform nucleus, left centrum semiovale compatible with infarction, stable in distribution comparison with the prior CT of head. Mild interval increase in hypoattenuation  and edema associated with the infarct. Left posterior temporal and loss of gray-white differentiation compatible  with acute infarction, not present on prior MRI or CT of the head. No interval hemorrhage or significant mass effect. No effacement of basilar cisterns. Diminished size of density in the posterior left sylvian fissure. Stable chronic microvascular ischemic changes of the brain and parenchymal volume loss. Vascular: Calcific atherosclerosis of carotid siphons. Skull: Normal. Negative for fracture or focal lesion. Sinuses/Orbits: No acute finding. Other: None. IMPRESSION: 1. New left posterolateral temporal lobe acute infarction. 2. Stable infarction centered in left frontal centrum semiovale extending into left superior lentiform nucleus and insula in comparison with prior CT/MRI head. Interval increase in edema and local mass effect associated with the infarct. 3. No acute intracranial hemorrhage. These results will be called to the ordering clinician or representative by the Radiologist Assistant, and communication documented in the PACS or zVision Dashboard. Electronically Signed   By: Kristine Garbe M.D.   On: 06/21/2017 04:57   Ct Head Wo Contrast  Result Date: 06/20/2017 CLINICAL DATA:  Status post revascularization of the left MCA. Right-sided defects. EXAM: CT HEAD WITHOUT CONTRAST TECHNIQUE: Contiguous axial images were obtained from the base of the skull through the vertex without intravenous contrast. COMPARISON:  CT head without contrast of the same day. FINDINGS: Brain: A focal density is noted posteriorly within the left sylvian fissure. This may represent contrast adjacent to a distal occlusion. There was faint density in this area on the earlier study in retrospect. No hemorrhage or mass lesion is present. Diffuse atrophy and white matter changes are otherwise stable. No focal cortical defects are present. The ventricles are stable. Vascular: Contrast is now noted within the major intracranial artery is. Distal left MCA lesion may be present as discussed above. Skull: Calvarium is intact.  No focal lytic or blastic lesions are present. Sinuses/Orbits: The paranasal sinuses and mastoid air cells are clear. Bilateral lens replacements are present. The globes and orbits are otherwise normal. Other: Right infraorbital hematoma has expanded. IMPRESSION: 1. Focal density posteriorly in the left sylvian fissure may reflect contrast associated with a more distal occlusion. 2. No acute hemorrhage. 3. No evidence for progressing infarct. 4. Stable atrophy and white matter disease. 5. Increasing size of right infraorbital hematoma. Electronically Signed   By: San Morelle M.D.   On: 06/20/2017 12:48   [name] Head Wo Contrast  Result Date: 06/20/2017 CLINICAL DATA:  Acute onset of right-sided weakness. Code stroke. Fall with right facial hematoma. EXAM: MRI HEAD WITHOUT CONTRAST MRA HEAD WITHOUT CONTRAST TECHNIQUE: Multiplanar, multiecho pulse sequences of the brain and surrounding structures were obtained without intravenous contrast. Angiographic images of the head were obtained using MRA technique without contrast. COMPARISON:  CT head without contrast from the same day. FINDINGS: MRI HEAD FINDINGS Diffusion only imaging was requested by Dr. Leonel Ramsay. The do pack scratched at the axial diffusion weighted images are mildly degraded by patient motion. There is an ill-defined area of potential restricted diffusion involving the left centrum semi ovale measuring 12 x 38 mm. No definite cortical lesions are present. There is no evidence for parenchymal hemorrhage. Right periorbital soft tissue hematoma is again noted. MRA HEAD FINDINGS The time-of-flight images are moderately degraded by patient motion. Flow is present in the internal carotid artery is through the ICA termini bilaterally. The A1 segments are patent bilaterally. ACA vessels are seen bilaterally. There is some narrowing of the distal right M1 segment. There is significant signal loss in  the distal left M1 segment after an early inferior  branch. Left MCA branch vessels are not visualized. There is marked attenuation of right MCA branch vessels. The left vertebral artery is the dominant vessel. The basilar artery is patent. Both posterior cerebral arteries originate from basilar tip. PCA branch vessels are attenuated bilaterally. IMPRESSION: 1. High-grade stenosis or occlusion of the distal left M1 segment with nonvisualization of distal MCA branch vessels. This corresponds with the patient's acute symptoms, likely representing an emergent large vessel occlusion. 2. Narrowing of the right M1 segment and attenuation of MCA branch vessels likely related to atherosclerotic disease. 3. Posterior circulation is intact. 4. Ill-defined area of restricted diffusion within white matter of the left centrum semi ovale may reflect acute ischemia. No definite cortical infarct is present. These results were called by telephone at the time of interpretation on 06/20/2017 at 9:28 am to Dr. Roland Rack , who verbally acknowledged these results. Electronically Signed   By: San Morelle M.D.   On: 06/20/2017 09:40   Mr Brain Wo Contrast  Result Date: 06/21/2017 CLINICAL DATA:  Right hemiplegia and aphasia. The patient remains intubated. EXAM: MRI HEAD WITHOUT CONTRAST TECHNIQUE: Multiplanar, multiecho pulse sequences of the brain and surrounding structures were obtained without intravenous contrast. COMPARISON:  CT head without contrast 06/21/2017 at 4:41 a.m. CT head without contrast 06/20/2017 12:08 p.m. MRI brain and 06/20/2017 at 9:13 a.m. FINDINGS: Brain: Diffusion-weighted images now demonstrate significant extension of left MCA territory infarct. There is diffuse restricted diffusion at the left insular ribbon and a left frontal operculum. There is extensive involvement of the posterior left temporal lobe and parietal lobe. Smaller focal cortical involvement is seen in the anterior left frontal lobe and posterior left parietal lobe. A focal area  of restricted cortical will diffusion is present in the posterior right parietal lobe with scattered foci involving the posterior right centrum semiovale. Vascular: Abnormal signal is present in the proximal posterior left M2 branch suggesting slow or occluded flow. Proximal flow is present in the major intracranial arteries. Skull and upper cervical spine: The skullbase is within normal limits. The craniocervical junction is normal. Marrow signal is normal. The upper cervical spine is unremarkable. Sinuses/Orbits: Mild mucosal thickening is present within the anterior ethmoid air cells. The paranasal sinuses are otherwise clear. Mild mastoid fluid is now present. There is some fluid in the posterior oropharynx as well. The patient is intubated. Bilateral lens replacements are present. The globes and orbits are within normal limits. Other: Right periorbital hematoma is again noted. IMPRESSION: 1. Increased size of acute/subacute left MCA territory infarct, now involving the left insular cortex, left frontal operculum, posterior left temporal and parietal lobe, and more anterior superior left frontal lobe. 2. No evidence for hemorrhagic conversion of left-sided infarcts. 3. Focal acute nonhemorrhagic infarct in the right parietal lobe and centrum semiovale. Bilateral disease raises concern for a central cardio embolic source. 4. Right periorbital hematoma. Electronically Signed   By: San Morelle M.D.   On: 06/21/2017 12:45   Mr Brain Wo Contrast  Result Date: 06/20/2017 CLINICAL DATA:  Acute onset of right-sided weakness. Code stroke. Fall with right facial hematoma. EXAM: MRI HEAD WITHOUT CONTRAST MRA HEAD WITHOUT CONTRAST TECHNIQUE: Multiplanar, multiecho pulse sequences of the brain and surrounding structures were obtained without intravenous contrast. Angiographic images of the head were obtained using MRA technique without contrast. COMPARISON:  CT head without contrast from the same day. FINDINGS:  MRI HEAD FINDINGS Diffusion only imaging was requested by  Dr. Leonel Ramsay. The do pack scratched at the axial diffusion weighted images are mildly degraded by patient motion. There is an ill-defined area of potential restricted diffusion involving the left centrum semi ovale measuring 12 x 38 mm. No definite cortical lesions are present. There is no evidence for parenchymal hemorrhage. Right periorbital soft tissue hematoma is again noted. MRA HEAD FINDINGS The time-of-flight images are moderately degraded by patient motion. Flow is present in the internal carotid artery is through the ICA termini bilaterally. The A1 segments are patent bilaterally. ACA vessels are seen bilaterally. There is some narrowing of the distal right M1 segment. There is significant signal loss in the distal left M1 segment after an early inferior branch. Left MCA branch vessels are not visualized. There is marked attenuation of right MCA branch vessels. The left vertebral artery is the dominant vessel. The basilar artery is patent. Both posterior cerebral arteries originate from basilar tip. PCA branch vessels are attenuated bilaterally. IMPRESSION: 1. High-grade stenosis or occlusion of the distal left M1 segment with nonvisualization of distal MCA branch vessels. This corresponds with the patient's acute symptoms, likely representing an emergent large vessel occlusion. 2. Narrowing of the right M1 segment and attenuation of MCA branch vessels likely related to atherosclerotic disease. 3. Posterior circulation is intact. 4. Ill-defined area of restricted diffusion within white matter of the left centrum semi ovale may reflect acute ischemia. No definite cortical infarct is present. These results were called by telephone at the time of interpretation on 06/20/2017 at 9:28 am to Dr. Roland Rack , who verbally acknowledged these results. Electronically Signed   By: San Morelle M.D.   On: 06/20/2017 09:40   Ir US Guide Bx  Asp/drain  Result Date: 06/24/2017 CLINICAL DATA:  83 year old female with a history of emergent large vessel occlusion treated with thrombectomy 06/20/2017. Subsequent pseudoaneurysm of right common femoral artery with hemorrhage 06/23/2017. EXAM: UNILATERAL RIGHT LOWER EXTREMITY ARTERIAL DUPLEX SCAN TECHNIQUE: Gray-scale sonography as well as color Doppler and duplex ultrasound was performed to evaluate the arteries of the lower extremity. COMPARISON:  Vascular study of the same date performed by the vascular lab FINDINGS: Limited grayscale and color duplex imaging of the right inguinal region demonstrates heterogeneous fluid within the soft tissues overlying the femoral artery. Deidre Ala estimate of the diameter, proximally 15 cm, increased from the comparison study of the same day. Greatest depth of the fluid measures approximately 5.0 cm. Evidence of persistent pseudoaneurysm with the neck of the pseudoaneurysm diameter measuring at least 6 mm with short neck identified. IMPRESSION: Enlarging hematoma related to pseudoaneurysm the right common femoral artery with persisting flow, and neck of the pseudoaneurysm measuring 6 mm. Signed, Dulcy Fanny. Earleen Newport, DO [facility] Radiology Electronically Signed   By: Corrie Mckusick D.O.   On: 06/24/2017 09:44   Dg Chest Port 1 View  Result Date: 06/30/2017 CLINICAL DATA:  Acute pulmonary edema EXAM: PORTABLE CHEST 1 VIEW COMPARISON:  Chest x-ray of June 29, 2017 FINDINGS: The trachea and esophagus have been extubated. The lungs are adequately inflated. There is obscuration of the hemidiaphragms more conspicuous today. There is increased density at both bases. The cardiac silhouette remains enlarged. The pulmonary vascularity is not clearly engorged. There is calcification in the wall of the aortic arch. The left subclavian venous catheter tip projects over the proximal SVC. IMPRESSION: Bibasilar atelectasis or pneumonia  greatest on the left. Cardiomegaly without significant pulmonary vascular congestion or pulmonary edema. Thoracic aortic atherosclerosis. Electronically Signed  By: David  Martinique M.D.   On: 06/30/2017 07:32   Dg Chest Portable 1 View  Result Date: 06/29/2017 CLINICAL DATA:  Respiratory failure. EXAM: PORTABLE CHEST 1 VIEW COMPARISON:  06/28/2017. FINDINGS: Endotracheal tube, feeding tube, left subclavian line in stable position. Cardiomegaly with normal pulmonary vascularity. Low lung volumes with left base atelectasis/infiltrate. Small left pleural effusion. No pneumothorax . IMPRESSION: 1.  Lines and tubes in stable position. 2. Low lung volumes with left base atelectasis/infiltrate. Small left pleural effusion. Electronically Signed   By: Marcello Moores  Register   On: 06/29/2017 07:21   Dg Chest Port 1 View  Result Date: 06/28/2017 CLINICAL DATA:  Acute respiratory failure.  Ischemic stroke. EXAM: PORTABLE CHEST 1 VIEW COMPARISON:  Yesterday FINDINGS: Endotracheal tube tip at the clavicular heads. Left subclavian central line at the brachiocephalic SVC confluence. A feeding tube at least reaches the diaphragm. Haziness of the left more than right chest with left diaphragm obscured due to retrocardiac lung opacity. Cardiomegaly. Coronary stent present. No pneumothorax. IMPRESSION: 1. Stable positioning of tubes and central line. 2. Hazy opacity at the left more than right base favoring atelectasis and pleural fluid. Electronically Signed   By: Monte Fantasia M.D.   On: 06/28/2017 07:17   Dg Chest Port 1 View  Result Date: 06/27/2017 CLINICAL DATA:  Pulmonary edema. EXAM: PORTABLE CHEST 1 VIEW COMPARISON:  Radiograph of June 26, 2017. FINDINGS: Stable cardiomegaly. Endotracheal and feeding tubes are unchanged in position. Left subclavian catheter is unchanged in position. No pneumothorax is noted. Stable hazy opacity seen in right lung which may represent small pleural effusion. Mildly increased left  basilar opacity is noted concerning for atelectasis or pneumonia with possible pleural effusion. IMPRESSION: Stable support apparatus. Mildly increased left basilar opacity concerning for atelectasis or pneumonia with possible associated pleural effusion. Electronically Signed   By: Marijo Conception, M.D.   On: 06/27/2017 08:04   Dg Chest Port 1 View  Result Date: 06/26/2017 CLINICAL DATA:  Stroke, central line EXAM: PORTABLE CHEST 1 VIEW COMPARISON:  06/26/2017 FINDINGS: Endotracheal tube tip is about 3 cm superior to the carina. Placement of a left-sided central venous catheter with tip overlying the SVC origin. No left pneumothorax is seen. Esophageal tube extends below diaphragm but is not included on the image. Stable cardiomegaly. Slightly improved aeration of the lung bases. Mild right greater than left hazy pulmonary opacity persists. Suspect that there are small effusions. IMPRESSION: 1. Left-sided central venous catheter tip overlies the SVC origin. No left pneumothorax 2. Slightly improved aeration of the lung bases. Small pleural effusions and hazy right greater than left edema or infiltrates are otherwise unchanged Electronically Signed   By: Donavan Foil M.D.   On: 06/26/2017 19:11   Dg Chest Port 1 View  Result Date: 06/26/2017 CLINICAL DATA:  ETT placement  Hx of CAD, HTN EXAM: PORTABLE CHEST 1 VIEW COMPARISON:  Chest x-ray from earlier same day. FINDINGS: Endotracheal tube is appropriately position with tip approximately 3 cm above the level of the carina. Enteric tube passes below the diaphragm. Heart size and mediastinal contours are stable in the short-term interval. Persistent pulmonary edema pattern, unchanged in the short-term interval. Probable small bilateral pleural effusions and/or atelectasis at each lung base. No pneumothorax seen. IMPRESSION: 1. Endotracheal tube is well positioned with tip just above the level of the carina. 2. No other change in the short-term interval.  Persistent pulmonary edema pattern. Probable small bilateral pleural effusions and/or atelectasis. Electronically Signed   By:  Franki Cabot M.D.   On: 06/26/2017 09:15   Dg Chest Port 1 View  Result Date: 06/26/2017 CLINICAL DATA:  Respiratory failure EXAM: PORTABLE CHEST 1 VIEW COMPARISON:  June 25, 2017 FINDINGS: No pneumothorax. Cardiomegaly is stable. The hila and mediastinum are unchanged. Pulmonary edema persists, slightly improved in the interval. Probable small bilateral effusions with underlying atelectasis. IMPRESSION: Cardiomegaly. Pulmonary edema persists, slightly improved in the interval. No other change. Electronically Signed   By: Dorise Bullion III M.D   On: 06/26/2017 07:03   Dg Chest Port 1 View  Result Date: 06/25/2017 CLINICAL DATA:  Shortness of breath tonight. EXAM: PORTABLE CHEST 1 VIEW COMPARISON:  Chest radiograph June 23, 2017 FINDINGS: The cardiac silhouette is moderately enlarged and unchanged. Mediastinal silhouette is nonsuspicious, accentuated by rotation to the RIGHT. Pulmonary vascular congestion and interstitial prominence with new LEFT lung base airspace opacity. Small pleural effusions. No pneumothorax. Feeding tube past proximal stomach, distal tip not included. Soft tissue planes and included osseous structures are unchanged. Interval extubation removal of temperature probe and RIGHT IJ catheter. IMPRESSION: Stable cardiomegaly. New pulmonary edema and LEFT lung base consolidation. Small pleural effusions. Interval extubation and removal of IJ catheter. Electronically Signed   By: Elon Alas M.D.   On: 06/25/2017 23:02   Dg Chest Port 1 View  Result Date: 06/23/2017 CLINICAL DATA:  Central venous catheter placement. EXAM: PORTABLE CHEST 1 VIEW COMPARISON:  Chest radiograph June 20, 2017 FINDINGS: The cardiac silhouette is mildly enlarged and unchanged. Pulmonary vascular congestion. Patient rotated to the RIGHT accentuating the mediastinal  silhouette. Calcified aortic knob. Endotracheal tube tip projects 4.3 cm above the carina. A temperature probe in the esophagus. Nasogastric tube past proximal stomach, distal tip not imaged. RIGHT internal jugular central venous catheter distal tip projects in mid superior vena cava. Small pleural effusions. No pneumothorax. Multiple lines and tubes overlie the patient. Soft tissue planes and included osseous structures are unchanged. IMPRESSION: RIGHT internal jugular central venous catheter distal tip projects in mid superior vena cava. Stable endotracheal tube. Nasogastric tube past proximal stomach. Mild cardiomegaly and pulmonary vascular congestion. Small pleural effusions. Electronically Signed   By: Elon Alas M.D.   On: 06/23/2017 22:52   Dg Chest Port 1 View  Result Date: 06/20/2017 CLINICAL DATA:  routine EXAM: PORTABLE CHEST 1 VIEW COMPARISON:  9.2.18 FINDINGS: Endotracheal to 3.6 cm from carina in good position. Two stable large cardiac silhouette. Mild venous congestion. No infiltrate or pneumothorax. IMPRESSION: Endotracheal tube in good position Cardiomegaly and mild central venous congestion. Electronically Signed   By: Suzy Bouchard M.D.   On: 06/20/2017 17:20   Dg Chest Port 1 View  Result Date: 06/20/2017 CLINICAL DATA:  Acute respiratory failure with hypoxia EXAM: PORTABLE CHEST 1 VIEW COMPARISON:  05/04/2016 CXR, chest CT 10/09/2013 FINDINGS: The tip of an endotracheal tube is 2.6 cm the carina. Cardiomegaly is noted with tortuous atherosclerotic aorta. No aneurysm. Small granuloma is seen along the periphery of the left lower lobe, confirmed on prior CT. No effusion or pneumothorax no overt pulmonary edema or pneumonic consolidation. IMPRESSION: 1. Stable cardiomegaly.  Aortic atherosclerosis with mild uncoiling. 2. Slightly low-lying endotracheal tube at 2.6 cm above the carina. Slight pullback of the of the ETT is recommended by 1 cm. 3. No acute pulmonary disease.  Electronically Signed   By: Ashley Royalty M.D.   On: 06/20/2017 16:07   Dg Swallowing Func-speech Pathology  Result Date: 06/30/2017 Objective Swallowing Evaluation: Type of Study: MBS-Modified  Barium Swallow Study Patient Details Name: KEELEY SUSSMAN MRN: [id number] Date of Birth: 26-Apr-1930 Today's Date: 06/30/2017 Time: SLP Start Time (ACUTE ONLY): 1330-SLP Stop Time (ACUTE ONLY): 1410 SLP Time Calculation (min) (ACUTE ONLY): 40 min Past Medical History: Past Medical History: Diagnosis Date . Anemia  . CAD (coronary artery disease)   a. STEMI 10/09/13 - 100% LAD - following initial angioplasty, ? Spontaneous dissection vs related to ballon. s/p complex PCI with 2 overlapping DES to LAD. Adm complicated by VDRF, cardiogenic shock, peri-MI PAF with NSR on amiodarone. Residual mod RCA disease.  Marland Kitchen CAD S/P percutaneous coronary angioplasty 10/09/13  DES x 2 to Mid LAD, 2.5 x 38, 2.5 x 12 Promus (covering extensive disection), moderate RCA disesase . Cardiogenic shock ([place])   a. 09/2013 - during admission for STEMI. . Depression  . Dyslipidemia  . H/O Acute respiratory failure due to hypoxia 10/10/2013  a. 09/2013 - secondary to anterior STEMI related pulmonary edema (resolved). . Hypertension  . Hypothyroidism  . Ischemic cardiomyopathy   a. EF 30-35% during 09/2013 adm for STEMI. b. Repeat Echo EF 40-45% with distal LAD hypokinesis (10/16/14 following STEMI recovery). . Migraine headache   a. Remotely. . Orthostatic hypotension   a. Adm 10/2013 for this, felt due to Lasix. Not on regular diuretic due to this/dehydration. Marland Kitchen PAF (paroxysmal atrial fibrillation) ([place])   a. 09/2013 - peri-MI, NSR at discharge on amiodarone. . Sinus bradycardia  Past Surgical History: Past Surgical History: Procedure Laterality Date . CORONARY ANGIOPLASTY WITH STENT PLACEMENT  10/09/13  2 overlapping Promus DES to mid LAD, occlusion/dissection (2.5 x 38, 2.5 x 12) . FEMORAL ARTERY EXPLORATION Right 06/23/2017  Procedure: repair right femoral  pseudoanerysm; evacuation hemotoma;  Surgeon: Elam Dutch, MD;  Location: [facility] OR;  Service: Vascular;  Laterality: Right; . IR ANGIO INTRA EXTRACRAN SEL COM CAROTID INNOMINATE UNI R MOD SED  06/20/2017 . IR PERCUTANEOUS ART THROMBECTOMY/INFUSION INTRACRANIAL INC DIAG ANGIO  06/20/2017 . IR US GUIDE BX ASP/DRAIN  06/23/2017 . LEFT HEART CATHETERIZATION WITH CORONARY ANGIOGRAM N/A 10/09/2013  Procedure: LEFT HEART CATHETERIZATION WITH CORONARY ANGIOGRAM;  Surgeon: Leonie Man, MD;  Location: [facility] CATH LAB;  Service: Cardiovascular;  Laterality: N/A; . RADIOLOGY WITH ANESTHESIA N/A 06/20/2017  Procedure: RADIOLOGY WITH ANESTHESIA;  Surgeon: Luanne Bras, MD;  Location: [place];  Service: Radiology;  Laterality: N/A; . SHOULDER SURGERY   HPI: 83 year old female w/ sig h/o atrial fib (not on systemic A/C). Presented to the ER on 9/2 w/ new (R) sided hemiplegia.  Dx  left MCA CVA, S/P endovascularization in IR.  ETT 9/2-9/4.   MRI: acute/subacute L MCA infarct, involving L insular cortex, L frontal operculum, L posterior temporal and parietal lobe, and more anterior L superior frontal lobe, as well as acute infarct R parietal lobe and Rcentrum semiovale, embolic, in the setting of atheorsclerosis. Pt subsequently with right groin hematoma; to OR 9/5 for repair femoral artery pseudoaneurysm; ETT 9/5-9/6.  Had MBS 9/7, which revealed aspiration of purees, honey, and nectar-thick liquids with non-protective, delayed cough.  Recs were made to continue NPO,  place cortrak.  Pt went into respiratory distress and was reintubated 9/8-9/11. Subjective: alert, participatory Assessment / Plan / Recommendation CHL IP CLINICAL IMPRESSIONS 06/30/2017 Clinical Impression Pt presents with a moderate-severe primary oral dysphagia, with notable improvements in pharyngeal function.  There remained poor oral control of POs, diffuse spreading throughout cavity, with pt tending to hold POs orally.  She required max verbal/tactile cues to  sustain motor activity in the oral phase.  After significant delay, portions of bolus passed into pharynx.  Once swallow triggered, she safely protected her airway with purees, honey, and nectar-thick liquids.  Thin liquids were immediately aspirated, eliciting a cough response. There was limited residue post-swallow.  Discussed results via phone with pt's daughter, Vinnie Level; reviewed results and video with pt's spouse.  They both agree they would like to proceed with an oral diet.  We discussed the ongoing risk of aspiration.  We decided to start cautiously with a dysphagia 1 diet, honey-thick liquids.  Pt will need 1:1 assist/cues during meal times to ensure safety.  Family verbalizes understanding.  SLP will continue to follow for safety/diet progression/therapeutic exercise.  SLP Visit Diagnosis Dysphagia, oropharyngeal phase (R13.12) Attention and concentration deficit following -- Frontal lobe and executive function deficit following -- Impact on safety and function Moderate aspiration risk   CHL IP TREATMENT RECOMMENDATION 06/30/2017 Treatment Recommendations Therapy as outlined in treatment plan below   Prognosis 06/30/2017 Prognosis for Safe Diet Advancement Good Barriers to Reach Goals -- Barriers/Prognosis Comment -- CHL IP DIET RECOMMENDATION 06/30/2017 SLP Diet Recommendations Dysphagia 1 (Puree) solids;Honey thick liquids Liquid Administration via Cup Medication Administration Crushed with puree Compensations Slow rate;Small sips/bites;Minimize environmental distractions;Lingual sweep for clearance of pocketing Postural Changes Remain semi-upright after after feeds/meals (Comment)   CHL IP OTHER RECOMMENDATIONS 06/30/2017 Recommended Consults -- Oral Care Recommendations Oral care BID Other Recommendations Order thickener from pharmacy   CHL IP FOLLOW UP RECOMMENDATIONS 06/30/2017 Follow up Recommendations Skilled Nursing facility   [facility] IP FREQUENCY AND DURATION 06/30/2017 Speech Therapy Frequency (ACUTE  ONLY) min 3x week Treatment Duration 2 weeks      CHL IP ORAL PHASE 06/30/2017 Oral Phase Impaired Oral - Pudding Teaspoon -- Oral - Pudding Cup -- Oral - Honey Teaspoon Right anterior bolus loss;Impaired mastication;Weak lingual manipulation;Incomplete tongue to palate contact;Reduced posterior propulsion;Holding of bolus;Right pocketing in lateral sulci;Pocketing in anterior sulcus;Lingual/palatal residue;Piecemeal swallowing;Delayed oral transit;Decreased bolus cohesion;Premature spillage Oral - Honey Cup Right anterior bolus loss;Impaired mastication;Weak lingual manipulation;Incomplete tongue to palate contact;Reduced posterior propulsion;Holding of bolus;Right pocketing in lateral sulci;Pocketing in anterior sulcus;Lingual/palatal residue;Piecemeal swallowing;Delayed oral transit;Decreased bolus cohesion;Premature spillage Oral - Nectar Teaspoon Right anterior bolus loss;Impaired mastication;Weak lingual manipulation;Incomplete tongue to palate contact;Reduced posterior propulsion;Holding of bolus;Right pocketing in lateral sulci;Pocketing in anterior sulcus;Lingual/palatal residue;Piecemeal swallowing;Delayed oral transit;Decreased bolus cohesion;Premature spillage Oral - Nectar Cup -- Oral - Nectar Straw -- Oral - Thin Teaspoon -- Oral - Thin Cup Right anterior bolus loss;Impaired mastication;Weak lingual manipulation;Incomplete tongue to palate contact;Reduced posterior propulsion;Holding of bolus;Right pocketing in lateral sulci;Pocketing in anterior sulcus;Lingual/palatal residue;Piecemeal swallowing;Delayed oral transit;Decreased bolus cohesion;Premature spillage Oral - Thin Straw -- Oral - Puree Right anterior bolus loss;Impaired mastication;Weak lingual manipulation;Incomplete tongue to palate contact;Reduced posterior propulsion;Holding of bolus;Right pocketing in lateral sulci;Pocketing in anterior sulcus;Lingual/palatal residue;Piecemeal swallowing;Delayed oral transit;Decreased bolus  cohesion;Premature spillage Oral - Mech Soft -- Oral - Regular -- Oral - Multi-Consistency -- Oral - Pill -- Oral Phase - Comment --  CHL IP PHARYNGEAL PHASE 06/30/2017 Pharyngeal Phase Impaired Pharyngeal- Pudding Teaspoon -- Pharyngeal -- Pharyngeal- Pudding Cup -- Pharyngeal -- Pharyngeal- Honey Teaspoon Delayed swallow initiation-pyriform sinuses;Reduced tongue base retraction;Pharyngeal residue - valleculae Pharyngeal Material does not enter airway Pharyngeal- Honey Cup -- Pharyngeal -- Pharyngeal- Nectar Teaspoon Delayed swallow initiation-pyriform sinuses;Reduced tongue base retraction;Pharyngeal residue - valleculae Pharyngeal Material does not enter airway Pharyngeal- Nectar Cup -- Pharyngeal -- Pharyngeal- Nectar Straw -- Pharyngeal -- Pharyngeal- Thin Teaspoon -- Pharyngeal -- Pharyngeal-  Thin Cup -- Pharyngeal -- Pharyngeal- Thin Straw -- Pharyngeal -- Pharyngeal- Puree Delayed swallow initiation-vallecula;Reduced tongue base retraction;Pharyngeal residue - valleculae Pharyngeal Material does not enter airway Pharyngeal- Mechanical Soft -- Pharyngeal -- Pharyngeal- Regular -- Pharyngeal -- Pharyngeal- Multi-consistency -- Pharyngeal -- Pharyngeal- Pill -- Pharyngeal -- Pharyngeal Comment thin liquids aspirated before the swallow; cough elicited  No flowsheet data found. No flowsheet data found. [name] 06/30/2017, 3:47 PM              Dg Swallowing Func-speech Pathology  Result Date: 06/25/2017 Objective Swallowing Evaluation: Type of Study: MBS-Modified Barium Swallow Study Patient Details Name: DEEPA BARTHEL MRN: [id number] Date of Birth: March 26, 1930 Today's Date: 06/25/2017 Time: SLP Start Time (ACUTE ONLY): 1415-SLP Stop Time (ACUTE ONLY): 1430 SLP Time Calculation (min) (ACUTE ONLY): 15 min Past Medical History: Past Medical History: Diagnosis Date . Anemia  . CAD (coronary artery disease)   a. STEMI 10/09/13 - 100% LAD - following initial angioplasty, ? Spontaneous dissection vs related  to ballon. s/p complex PCI with 2 overlapping DES to LAD. Adm complicated by VDRF, cardiogenic shock, peri-MI PAF with NSR on amiodarone. Residual mod RCA disease.  Marland Kitchen CAD S/P percutaneous coronary angioplasty 10/09/13  DES x 2 to Mid LAD, 2.5 x 38, 2.5 x 12 Promus (covering extensive disection), moderate RCA disesase . Cardiogenic shock ([place])   a. 09/2013 - during admission for STEMI. . Depression  . Dyslipidemia  . H/O Acute respiratory failure due to hypoxia 10/10/2013  a. 09/2013 - secondary to anterior STEMI related pulmonary edema (resolved). . Hypertension  . Hypothyroidism  . Ischemic cardiomyopathy   a. EF 30-35% during 09/2013 adm for STEMI. b. Repeat Echo EF 40-45% with distal LAD hypokinesis (10/16/14 following STEMI recovery). . Migraine headache   a. Remotely. . Orthostatic hypotension   a. Adm 10/2013 for this, felt due to Lasix. Not on regular diuretic due to this/dehydration. Marland Kitchen PAF (paroxysmal atrial fibrillation) ([place])   a. 09/2013 - peri-MI, NSR at discharge on amiodarone. . Sinus bradycardia  Past Surgical History: Past Surgical History: Procedure Laterality Date . CORONARY ANGIOPLASTY WITH STENT PLACEMENT  10/09/13  2 overlapping Promus DES to mid LAD, occlusion/dissection (2.5 x 38, 2.5 x 12) . FEMORAL ARTERY EXPLORATION Right 06/23/2017  Procedure: repair right femoral pseudoanerysm; evacuation hemotoma;  Surgeon: Elam Dutch, MD;  Location: [facility] OR;  Service: Vascular;  Laterality: Right; . IR ANGIO INTRA EXTRACRAN SEL COM CAROTID INNOMINATE UNI R MOD SED  06/20/2017 . IR PERCUTANEOUS ART THROMBECTOMY/INFUSION INTRACRANIAL INC DIAG ANGIO  06/20/2017 . IR US GUIDE BX ASP/DRAIN  06/23/2017 . LEFT HEART CATHETERIZATION WITH CORONARY ANGIOGRAM N/A 10/09/2013  Procedure: LEFT HEART CATHETERIZATION WITH CORONARY ANGIOGRAM;  Surgeon: Leonie Man, MD;  Location: [facility] CATH LAB;  Service: Cardiovascular;  Laterality: N/A; . RADIOLOGY WITH ANESTHESIA N/A 06/20/2017  Procedure: RADIOLOGY WITH ANESTHESIA;   Surgeon: Luanne Bras, MD;  Location: [place];  Service: Radiology;  Laterality: N/A; . SHOULDER SURGERY   HPI: 83 year old female w/ sig h/o atrial fib (not on systemic A/C). Presented to the ER on 9/2 w/ new (R) sided hemiplegia.  Dx  left MCA CVA, S/P endovascularization in IR.  ETT 9/2-9/4.   MRI: acute/subacute L MCA infarct, involving L insular cortex, L frontal operculum, L posterior temporal and parietal lobe, and more anterior L superior frontal lobe, as well as acute infarct R parietal lobe and Rcentrum semiovale, embolic, in the setting of atheorsclerosis. Pt subsequently with right groin hematoma;  to OR 9/5 for repair femoral artery pseudoaneurysm; ETT 9/5-9/6.  Subjective: alert, attempting to talk Assessment / Plan / Recommendation CHL IP CLINICAL IMPRESSIONS 06/25/2017 Clinical Impression Pt presents with a moderate-severe sensorimotor oropharyngeal dysphagia c/b poor oral control of POs, diffuse spreading throughout cavity, and uncontrolled spillage of all materials into pharynx.  Purees, honey, and nectar-thick liquids are all aspirated before the swallow.  There is diffuse residue throughout pharynx after the swallow response.  Pt has a delayed cough in reaction to aspiration, but it occurs well after the aspirate has descended into the trachea.  Study was reviewed at length with pt's daughter, Vinnie Level.  For now, continue NPO status with cortrak for nutrition.  After discussion with Dr. Leonie Man, decision made to repeat MBS on Monday, 9/10 to determine improvement, necessity of PEG.  SLP Visit Diagnosis Dysphagia, oropharyngeal phase (R13.12) Attention and concentration deficit following -- Frontal lobe and executive function deficit following -- Impact on safety and function Severe aspiration risk   CHL IP TREATMENT RECOMMENDATION 06/25/2017 Treatment Recommendations Therapy as outlined in treatment plan below   Prognosis 06/25/2017 Prognosis for Safe Diet Advancement Fair Barriers to Reach Goals --  Barriers/Prognosis Comment -- CHL IP DIET RECOMMENDATION 06/25/2017 SLP Diet Recommendations NPO;Alternative means - temporary Liquid Administration via -- Medication Administration -- Compensations -- Postural Changes --   CHL IP OTHER RECOMMENDATIONS 06/25/2017 Recommended Consults -- Oral Care Recommendations Oral care QID Other Recommendations --   CHL IP FOLLOW UP RECOMMENDATIONS 06/25/2017 Follow up Recommendations Skilled Nursing facility   [facility] IP FREQUENCY AND DURATION 06/25/2017 Speech Therapy Frequency (ACUTE ONLY) min 3x week Treatment Duration 2 weeks      CHL IP ORAL PHASE 06/25/2017 Oral Phase Impaired Oral - Pudding Teaspoon -- Oral - Pudding Cup -- Oral - Honey Teaspoon Right anterior bolus loss;Impaired mastication;Weak lingual manipulation;Incomplete tongue to palate contact;Reduced posterior propulsion;Holding of bolus;Right pocketing in lateral sulci;Pocketing in anterior sulcus;Piecemeal swallowing;Delayed oral transit;Decreased bolus cohesion;Premature spillage Oral - Honey Cup -- Oral - Nectar Teaspoon Right anterior bolus loss;Impaired mastication;Weak lingual manipulation;Incomplete tongue to palate contact;Reduced posterior propulsion;Holding of bolus;Right pocketing in lateral sulci;Pocketing in anterior sulcus;Piecemeal swallowing;Delayed oral transit;Decreased bolus cohesion;Premature spillage Oral - Nectar Cup -- Oral - Nectar Straw -- Oral - Thin Teaspoon -- Oral - Thin Cup -- Oral - Thin Straw -- Oral - Puree Right anterior bolus loss;Impaired mastication;Weak lingual manipulation;Incomplete tongue to palate contact;Reduced posterior propulsion;Holding of bolus;Right pocketing in lateral sulci;Pocketing in anterior sulcus;Piecemeal swallowing;Delayed oral transit;Decreased bolus cohesion;Premature spillage Oral - Mech Soft -- Oral - Regular -- Oral - Multi-Consistency -- Oral - Pill -- Oral Phase - Comment --  CHL IP PHARYNGEAL PHASE 06/25/2017 Pharyngeal Phase Impaired Pharyngeal- Pudding  Teaspoon -- Pharyngeal -- Pharyngeal- Pudding Cup -- Pharyngeal -- Pharyngeal- Honey Teaspoon Delayed swallow initiation-pyriform sinuses;Reduced pharyngeal peristalsis;Reduced epiglottic inversion;Reduced anterior laryngeal mobility;Reduced laryngeal elevation;Reduced airway/laryngeal closure;Reduced tongue base retraction;Penetration/Aspiration before swallow;Moderate aspiration;Pharyngeal residue - valleculae;Pharyngeal residue - pyriform Pharyngeal Material enters airway, passes BELOW cords and not ejected out despite cough attempt by patient Pharyngeal- Honey Cup -- Pharyngeal -- Pharyngeal- Nectar Teaspoon Delayed swallow initiation-pyriform sinuses;Reduced pharyngeal peristalsis;Reduced epiglottic inversion;Reduced anterior laryngeal mobility;Reduced laryngeal elevation;Reduced airway/laryngeal closure;Reduced tongue base retraction;Penetration/Aspiration before swallow;Moderate aspiration;Pharyngeal residue - valleculae;Pharyngeal residue - pyriform Pharyngeal Material enters airway, passes BELOW cords and not ejected out despite cough attempt by patient Pharyngeal- Nectar Cup -- Pharyngeal -- Pharyngeal- Nectar Straw -- Pharyngeal -- Pharyngeal- Thin Teaspoon -- Pharyngeal -- Pharyngeal- Thin Cup -- Pharyngeal -- Pharyngeal- Thin Straw -- Pharyngeal --  Pharyngeal- Puree Delayed swallow initiation-pyriform sinuses;Reduced pharyngeal peristalsis;Reduced epiglottic inversion;Reduced anterior laryngeal mobility;Reduced laryngeal elevation;Reduced airway/laryngeal closure;Reduced tongue base retraction;Penetration/Aspiration before swallow;Moderate aspiration;Pharyngeal residue - valleculae;Pharyngeal residue - pyriform Pharyngeal -- Pharyngeal- Mechanical Soft -- Pharyngeal -- Pharyngeal- Regular -- Pharyngeal -- Pharyngeal- Multi-consistency -- Pharyngeal -- Pharyngeal- Pill -- Pharyngeal -- Pharyngeal Comment --  No flowsheet data found. No flowsheet data found. [name] 06/25/2017, 2:53 PM               Ir Percutaneous Art Thrombectomy/infusion Intracranial Inc Diag Angio  Result Date: 06/24/2017 INDICATION: Acute onset aphasia, left gaze deviation and right-sided weakness. Abnormal MRI MRA of the brain. EXAM: 1. EMERGENT LARGE VESSEL OCCLUSION THROMBOLYSIS (anterior CIRCULATION) COMPARISON:  CT of the brain of 06/20/2017, MRI MRA of the brain of 06/20/2017. MEDICATIONS: Vancomycin 1 g IV antibiotic was administered within 1 hour of the procedure. ANESTHESIA/SEDATION: General anesthesia. CONTRAST:  Isovue 300 approximately 80 cc. FLUOROSCOPY TIME:  Fluoroscopy Time: 48 minutes 42 seconds (1988 mGy). COMPLICATIONS: None immediate. TECHNIQUE: Following a full explanation of the procedure along with the potential associated complications, an informed witnessed consent was obtained from the patient's husband. The risks of intracranial hemorrhage of 10%, worsening neurological deficit, ventilator dependency, death and inability to revascularize were all reviewed in detail with the patient's husband. The patient was then put under general anesthesia by the Department of Anesthesiology at [facility]. The right groin was prepped and draped in the usual sterile fashion. Thereafter using modified Seldinger technique, transfemoral access into the right common femoral artery was obtained without difficulty. Over a 0.035 inch guidewire a 5 French Pinnacle sheath was inserted. Through this, and also over a 0.035 inch guidewire a 5 [place] JB 1 catheter was advanced to the aortic arch region and selectively positioned in the the left common carotid artery. FINDINGS: The left common carotid arteriogram demonstrates the left external carotid artery and its major branches to be widely patent. The left internal carotid artery at the bulb to the cranial skull base opacifies normally. The petrous, cavernous and supraclinoid segments are widely patent. The left anterior cerebral artery is seen to opacify into the  capillary and venous phases. The left middle cerebral artery and its distal half demonstrates a nearly occlusive filling defect with slow flow demonstrated distal to this. There is complete occlusion of the proximal superior division, and also the inferior division of the left middle cerebral artery in the M2 region. A left posterior communicating artery is seen opacifying the left posterior cerebral artery distribution. The delayed arterial phase demonstrates a large area of hypoperfusion involving the periinsular cortex and the left parietal subcortical area. Transient cross opacification via the anterior communicating artery of the right anterior cerebral A2 segment and distally is noted. PROCEDURE: The diagnostic JB 1 catheter in the left common carotid artery was exchanged over a 0.035 inch 300 cm Rosen exchange guidewire for an 8 French 55 cm Brite tip neurovascular sheath using biplane roadmap technique and constant fluoroscopic guidance. Good aspiration obtained from the side port of the neurovascular sheath. This was then connected to continuous heparinized saline infusion. Over the [employer] guidewire, an 8 [place] 85 cm FlowGate balloon guide catheter which had been prepped with 50% contrast and 50% heparinized saline infusion was advanced to just proximal to the left common carotid bifurcation. The guidewire was removed. Good aspiration obtained from the hub of the 8 [place] FlowGate guide catheter. Over a 0.035 inch Roadrunner guidewire, using biplane roadmap technique, the 8  [place] FlowGate guide catheter was then advanced to the distal cervical left ICA. The guidewire was removed. Good aspiration obtained from the hub of the 8 [place] FlowGate guide catheter. A gentle contrast injection demonstrated no evidence of spasms, dissections or of intraluminal filling defects. No change was noted in the intracranial circulation. At this time, in a coaxial manner and with constant heparinized saline infusion  using biplane roadmap technique and constant fluoroscopic guidance, a combination of a Trevo ProVue 021 microcatheter inside of a 5 French 115 cm Catalyst guide catheter was advanced over a 0.014 inch Softip Synchro micro guidewire to the supraclinoid left ICA. The micro guidewire was then gently advanced into the M1 segment followed by the microcatheter into the inferior division of the left middle cerebral artery. This was in the M2 M3 region. The guidewire was removed. Good aspiration obtained from the hub of the microcatheter. Gentle contrast injection demonstrated slow antegrade flow. At this time, a 4 mm x 40 mm Solitaire FR retrieval device which had been purged with heparinized saline infusion was advanced to the distal end of the microcatheter. The proximal and the distal markers were then aligned appropriately. The O ring on the delivery micro catheter was loosened. With slight forward gentle traction with the right hand on the delivery micro guidewire, with the left hand, the delivery microcatheter was then retrieved unsheathing the retrieval device. A gentle control arteriogram performed through the 5 [place] Catalyst guide catheter in the left internal carotid artery demonstrated slow flow through the retrieval device. The Catalyst guide catheter was then advanced into the proximal M1 region. The proximal portion of the retrieval device was then captured into the microcatheter. Thereafter the combination of the retrieval device, the microcatheter and the 5 [place] Catalyst guide catheter were gently retrieved and removed following proximal flow arrest in the left internal carotid artery by inflating the balloon at the tip of the [facility] guide catheter. Aspiration with a 60 mL syringe at the hub of the [facility] guide catheter, and a 20 mL syringe at the hub of the Catalyst guide catheter was continued as the combination was retrieved and removed. There were only a few specks of clots noted in the  aspirate. None were observed on the interstices of the retrieval device. Aspiration was continued as the balloon was deflated in the left internal carotid artery. A control arteriogram performed through the [facility] guide catheter in the left internal carotid artery demonstrated improved flow through the inferior division of the left middle cerebral artery. However, slow flow was noted in its distal M3 M4 region. Contrast was noted in the tiny clot noted in the M3 M4 region. This was deemed too distal for safe retrieval. As described above, access was now obtained into the superior division of the left middle cerebral artery M2 M3 region using a microcatheter, and a 5 [place] Catalyst guide catheter combination over a 0.014 inch Softip Synchro micro guidewire. The distal end of the microcatheter was positioned in the M2 M3 region. Good aspiration obtained from the hub of the microcatheter. This was then connected to continuous heparinized saline infusion. A 4 mm x 40 mm Solitaire FR retrieval device was then advanced as described above to the distal end of the microcatheter. The distal and the proximal markers were then aligned appropriately prior to the deployment of the retrieval device. Again the proximal portion of the retrieval device was captured with the guide catheter now advanced into the distal M1 segment. With proximal flow arrest  by inflating the balloon in the left internal carotid artery, and with constant aspiration at the hub of the 8 [place] FlowGate guide catheter hub, and the hub of the 5 [place] Catalyst guide catheter, the combination of the retrieval device, the microcatheter, and also of the 5 [place] guide catheter was retrieved slowly and deliberately. Aspiration was continued as the balloon was deflated in the left internal carotid artery. At this time, the aspirate contained two large pieces of shiny blood-stained clot. Free back bleed at the hub of the 8 [place] FlowGate guide catheter was  noted. A control arteriogram performed through the [facility] guide catheter in the left internal carotid artery demonstrated complete revascularization of the superior division. The previously noted filling defect in the M3 M4 division of the inferior division had moved slightly further distally. A final control arteriogram performed through the [facility] guide catheter in the left internal carotid artery demonstrated a reperfusion of the left middle cerebral artery distribution, the anterior cerebral artery and the posterior communicating artery remained widely patent. Throughout the procedure, the patient's blood pressure and neurological status remained stable. No angiographic evidence of extravasation or of abnormal mass-effect was seen. The 8 [place] FlowGate guide catheter and the 8 French neurovascular sheath were then exchanged over a J-tip guidewire for a 9 [place] Pinnacle sheath. This was then connected to continuous heparinized saline infusion. IMPRESSION: IMPRESSION Status post endovascular revascularization of occluded left middle cerebral artery distally, and the superior and inferior divisions with 2 passes with the Solitaire FR 4 mm x 40 mm retrieval device achieving a TICI 2b reperfusion. PLAN: Patient transported to the CT scanner for postprocedural CT scan of the brain. Electronically Signed   By: Luanne Bras M.D.   On: 06/22/2017 18:38   Ct Head Code Stroke Wo Contrast  Result Date: 06/20/2017 CLINICAL DATA:  Code stroke. Change in mental status while in the MRI scanner. EXAM: CT HEAD WITHOUT CONTRAST TECHNIQUE: Contiguous axial images were obtained from the base of the skull through the vertex without intravenous contrast. COMPARISON:  None. FINDINGS: Brain: The study is moderately degraded by patient motion. There is no significant change in atrophy white matter disease. No new cortical lesions are present. Asymmetric white matter hypoattenuation is present in the left corona radiata.  Vascular: No definite hyperdense vessel. Skull: Calvarium is intact. Right periorbital soft tissue hematoma has not changed significantly. Sinuses/Orbits: The paranasal sinuses and mastoid air cells are clear. Globes and orbits are otherwise unremarkable. ASPECTS [facility] Stroke Program Early CT Score) - Ganglionic level infarction (caudate, lentiform nuclei, internal capsule, insula, M1-M3 cortex): 7/7 - Supraganglionic infarction (M4-M6 cortex): 3/3 Total score (0-10 with 10 being normal): 10/10 IMPRESSION: 1. No significant interval change. 2. Stable atrophy and white matter disease. 3. No definite hyperdense vessel. 4. Stable right periorbital hematoma. 5. ASPECTS is 10/10 These results were called by telephone at the time of interpretation on 06/20/2017 at 10:05 am to Dr. Roland Rack , who verbally acknowledged these results. Electronically Signed   By: San Morelle M.D.   On: 06/20/2017 10:05   Ct Head Code Stroke Wo Contrast  Result Date: 06/20/2017 CLINICAL DATA:  Code stroke. Acute onset of right-sided weakness and right facial droop with slurred speech. Fall. Right periorbital hematoma. EXAM: CT HEAD WITHOUT CONTRAST TECHNIQUE: Contiguous axial images were obtained from the base of the skull through the vertex without intravenous contrast. COMPARISON:  CT head without contrast 01/27/2013 FINDINGS: Brain: Mild atrophy and white matter changes are present bilaterally.  The basal ganglia are intact. Insular ribbon is normal bilaterally. No acute or focal cortical abnormality is present. Vascular: Atherosclerotic calcifications are present within the cavernous internal carotid artery is bilaterally. There is no hyperdense vessel. Skull: The calvarium is intact. No focal lytic or blastic lesions are present. Sinuses/Orbits: The paranasal sinuses and mastoid air cells are clear. The patient is status post bilateral lens replacements. May prominent right infraorbital hematoma is present without an  underlying fracture. Zygomatic arch and orbital rim are intact. ASPECTS [facility] Stroke Program Early CT Score) - Ganglionic level infarction (caudate, lentiform nuclei, internal capsule, insula, M1-M3 cortex): 7/7 - Supraganglionic infarction (M4-M6 cortex): 3/3 Total score (0-10 with 10 being normal): 10/10 IMPRESSION: 1. Stable atrophy and white matter disease. 2. No acute intracranial abnormality or significant interval change. 3. Prominent right infraorbital and lateral soft tissue hematoma without underlying fracture or intraorbital extension. 4. ASPECTS is 10/10 These results were called by telephone at the time of interpretation on 06/20/2017 at 8:28 am to Dr. Leonel Ramsay, who verbally acknowledged these results. Electronically Signed   By: San Morelle M.D.   On: 06/20/2017 08:35   Ct Maxillofacial Wo Contrast  Result Date: 06/20/2017 CLINICAL DATA:  Right periorbital hematoma after falling and hitting the right side of her face. EXAM: CT MAXILLOFACIAL WITHOUT CONTRAST TECHNIQUE: Multidetector CT imaging of the maxillofacial structures was performed. Multiplanar CT image reconstructions were also generated. COMPARISON:  Head CT obtained earlier today. FINDINGS: Osseous: No fractures. Orbits: Status post bilateral cataract extraction. Sinuses: Normally pneumatized. Soft tissues: Right infraorbital and periorbital hematoma. Limited intracranial: Per the head CT report earlier today. IMPRESSION: 1. No maxillofacial fracture. 2. Right infraorbital and periorbital soft tissue hematoma. Electronically Signed   By: Claudie Revering M.D.   On: 06/20/2017 09:02   Ir Angio Intra Extracran Sel Com Carotid Innominate Uni R Mod Sed  Result Date: 06/24/2017 INDICATION: Acute onset aphasia, left gaze deviation and right-sided weakness. Abnormal MRI MRA of the brain. EXAM: 1. EMERGENT LARGE VESSEL OCCLUSION THROMBOLYSIS (anterior CIRCULATION) COMPARISON:  CT of the brain of 06/20/2017, MRI MRA of the brain of  06/20/2017. MEDICATIONS: Vancomycin 1 g IV antibiotic was administered within 1 hour of the procedure. ANESTHESIA/SEDATION: General anesthesia. CONTRAST:  Isovue 300 approximately 80 cc. FLUOROSCOPY TIME:  Fluoroscopy Time: 48 minutes 42 seconds (1988 mGy). COMPLICATIONS: None immediate. TECHNIQUE: Following a full explanation of the procedure along with the potential associated complications, an informed witnessed consent was obtained from the patient's husband. The risks of intracranial hemorrhage of 10%, worsening neurological deficit, ventilator dependency, death and inability to revascularize were all reviewed in detail with the patient's husband. The patient was then put under general anesthesia by the Department of Anesthesiology at [facility]. The right groin was prepped and draped in the usual sterile fashion. Thereafter using modified Seldinger technique, transfemoral access into the right common femoral artery was obtained without difficulty. Over a 0.035 inch guidewire a 5 [place] Pinnacle sheath was inserted. Through this, and also over a 0.035 inch guidewire a 5 [place] JB 1 catheter was advanced to the aortic arch region and selectively positioned in the the left common carotid artery. FINDINGS: The left common carotid arteriogram demonstrates the left external carotid artery and its major branches to be widely patent. The left internal carotid artery at the bulb to the cranial skull base opacifies normally. The petrous, cavernous and supraclinoid segments are widely patent. The left anterior cerebral artery is seen to opacify into the capillary and venous  phases. The left middle cerebral artery and its distal half demonstrates a nearly occlusive filling defect with slow flow demonstrated distal to this. There is complete occlusion of the proximal superior division, and also the inferior division of the left middle cerebral artery in the M2 region. A left posterior communicating artery is seen  opacifying the left posterior cerebral artery distribution. The delayed arterial phase demonstrates a large area of hypoperfusion involving the periinsular cortex and the left parietal subcortical area. Transient cross opacification via the anterior communicating artery of the right anterior cerebral A2 segment and distally is noted. PROCEDURE: The diagnostic JB 1 catheter in the left common carotid artery was exchanged over a 0.035 inch 300 cm Rosen exchange guidewire for an 8 [place] 55 cm Brite tip neurovascular sheath using biplane roadmap technique and constant fluoroscopic guidance. Good aspiration obtained from the side port of the neurovascular sheath. This was then connected to continuous heparinized saline infusion. Over the [employer] guidewire, an 8 [place] 85 cm FlowGate balloon guide catheter which had been prepped with 50% contrast and 50% heparinized saline infusion was advanced to just proximal to the left common carotid bifurcation. The guidewire was removed. Good aspiration obtained from the hub of the 8 [place] FlowGate guide catheter. Over a 0.035 inch Roadrunner guidewire, using biplane roadmap technique, the 8 [place] FlowGate guide catheter was then advanced to the distal cervical left ICA. The guidewire was removed. Good aspiration obtained from the hub of the 8 [place] FlowGate guide catheter. A gentle contrast injection demonstrated no evidence of spasms, dissections or of intraluminal filling defects. No change was noted in the intracranial circulation. At this time, in a coaxial manner and with constant heparinized saline infusion using biplane roadmap technique and constant fluoroscopic guidance, a combination of a Trevo ProVue 021 microcatheter inside of a 5 [place] 115 cm Catalyst guide catheter was advanced over a 0.014 inch Softip Synchro micro guidewire to the supraclinoid left ICA. The micro guidewire was then gently advanced into the M1 segment followed by the microcatheter into  the inferior division of the left middle cerebral artery. This was in the M2 M3 region. The guidewire was removed. Good aspiration obtained from the hub of the microcatheter. Gentle contrast injection demonstrated slow antegrade flow. At this time, a 4 mm x 40 mm Solitaire FR retrieval device which had been purged with heparinized saline infusion was advanced to the distal end of the microcatheter. The proximal and the distal markers were then aligned appropriately. The O ring on the delivery micro catheter was loosened. With slight forward gentle traction with the right hand on the delivery micro guidewire, with the left hand, the delivery microcatheter was then retrieved unsheathing the retrieval device. A gentle control arteriogram performed through the 5 [place] Catalyst guide catheter in the left internal carotid artery demonstrated slow flow through the retrieval device. The Catalyst guide catheter was then advanced into the proximal M1 region. The proximal portion of the retrieval device was then captured into the microcatheter. Thereafter the combination of the retrieval device, the microcatheter and the 5 [place] Catalyst guide catheter were gently retrieved and removed following proximal flow arrest in the left internal carotid artery by inflating the balloon at the tip of the [facility] guide catheter. Aspiration with a 60 mL syringe at the hub of the [facility] guide catheter, and a 20 mL syringe at the hub of the Catalyst guide catheter was continued as the combination was retrieved and removed. There were only a few  specks of clots noted in the aspirate. None were observed on the interstices of the retrieval device. Aspiration was continued as the balloon was deflated in the left internal carotid artery. A control arteriogram performed through the [facility] guide catheter in the left internal carotid artery demonstrated improved flow through the inferior division of the left middle cerebral artery. However,  slow flow was noted in its distal M3 M4 region. Contrast was noted in the tiny clot noted in the M3 M4 region. This was deemed too distal for safe retrieval. As described above, access was now obtained into the superior division of the left middle cerebral artery M2 M3 region using a microcatheter, and a 5 [place] Catalyst guide catheter combination over a 0.014 inch Softip Synchro micro guidewire. The distal end of the microcatheter was positioned in the M2 M3 region. Good aspiration obtained from the hub of the microcatheter. This was then connected to continuous heparinized saline infusion. A 4 mm x 40 mm Solitaire FR retrieval device was then advanced as described above to the distal end of the microcatheter. The distal and the proximal markers were then aligned appropriately prior to the deployment of the retrieval device. Again the proximal portion of the retrieval device was captured with the guide catheter now advanced into the distal M1 segment. With proximal flow arrest by inflating the balloon in the left internal carotid artery, and with constant aspiration at the hub of the 8 [place] FlowGate guide catheter hub, and the hub of the 5 [place] Catalyst guide catheter, the combination of the retrieval device, the microcatheter, and also of the 5 [place] guide catheter was retrieved slowly and deliberately. Aspiration was continued as the balloon was deflated in the left internal carotid artery. At this time, the aspirate contained two large pieces of shiny blood-stained clot. Free back bleed at the hub of the 8 [place] FlowGate guide catheter was noted. A control arteriogram performed through the [facility] guide catheter in the left internal carotid artery demonstrated complete revascularization of the superior division. The previously noted filling defect in the M3 M4 division of the inferior division had moved slightly further distally. A final control arteriogram performed through the [facility] guide catheter  in the left internal carotid artery demonstrated a reperfusion of the left middle cerebral artery distribution, the anterior cerebral artery and the posterior communicating artery remained widely patent. Throughout the procedure, the patient's blood pressure and neurological status remained stable. No angiographic evidence of extravasation or of abnormal mass-effect was seen. The 8 [place] FlowGate guide catheter and the 8 French neurovascular sheath were then exchanged over a J-tip guidewire for a 9 [place] Pinnacle sheath. This was then connected to continuous heparinized saline infusion. IMPRESSION: IMPRESSION Status post endovascular revascularization of occluded left middle cerebral artery distally, and the superior and inferior divisions with 2 passes with the Solitaire FR 4 mm x 40 mm retrieval device achieving a TICI 2b reperfusion. PLAN: Patient transported to the CT scanner for postprocedural CT scan of the brain. Electronically Signed   By: Luanne Bras M.D.   On: 06/22/2017 18:38    Assessment/Plan  1. Wound infection Culture/DNA analysis of wound due to increasing size, drainage, odor, and necrotic tissue Doxycycline 100 mg BID for 7 days (see allergy list) Florastor 1 cap BID for 7 days F/U with Dr. Oneida Alar on Thursday  (small amt of necrotic tissue noted) [facility] care [facility] at [facility] to follow   2. Protein calorie malnutrition Last alb was 2.7, given her  current diet and debility her wound may be slow to heal Weight is above goal but trending downward since her acute illness Nutrition to follow  Family/ staff Communication: discussed with nurse  Labs/tests ordered:  Wound culture/DNA analysis, CBC and BMP

## 2017-07-13 DIAGNOSIS — I69393 Ataxia following cerebral infarction: Secondary | ICD-10-CM | POA: Diagnosis not present

## 2017-07-13 DIAGNOSIS — E876 Hypokalemia: Secondary | ICD-10-CM | POA: Diagnosis not present

## 2017-07-13 DIAGNOSIS — I69398 Other sequelae of cerebral infarction: Secondary | ICD-10-CM | POA: Diagnosis not present

## 2017-07-13 DIAGNOSIS — I69851 Hemiplegia and hemiparesis following other cerebrovascular disease affecting right dominant side: Secondary | ICD-10-CM | POA: Diagnosis not present

## 2017-07-13 DIAGNOSIS — I69311 Memory deficit following cerebral infarction: Secondary | ICD-10-CM | POA: Diagnosis not present

## 2017-07-13 DIAGNOSIS — D649 Anemia, unspecified: Secondary | ICD-10-CM | POA: Diagnosis not present

## 2017-07-13 DIAGNOSIS — I69391 Dysphagia following cerebral infarction: Secondary | ICD-10-CM | POA: Diagnosis not present

## 2017-07-13 DIAGNOSIS — T8131XD Disruption of external operation (surgical) wound, not elsewhere classified, subsequent encounter: Secondary | ICD-10-CM | POA: Diagnosis not present

## 2017-07-13 DIAGNOSIS — T8189XA Other complications of procedures, not elsewhere classified, initial encounter: Secondary | ICD-10-CM | POA: Diagnosis not present

## 2017-07-13 DIAGNOSIS — I69320 Aphasia following cerebral infarction: Secondary | ICD-10-CM | POA: Diagnosis not present

## 2017-07-13 LAB — CBC AND DIFFERENTIAL
HEMATOCRIT: 39 (ref 36–46)
HEMOGLOBIN: 12.2 (ref 12.0–16.0)
PLATELETS: 269 (ref 150–399)
WBC: 6.6

## 2017-07-13 LAB — BASIC METABOLIC PANEL
BUN: 19 (ref 4–21)
Creatinine: 0.7 (ref 0.5–1.1)
Glucose: 88
Potassium: 3.3 — AB (ref 3.4–5.3)
SODIUM: 140 (ref 137–147)

## 2017-07-14 DIAGNOSIS — I69391 Dysphagia following cerebral infarction: Secondary | ICD-10-CM | POA: Diagnosis not present

## 2017-07-14 DIAGNOSIS — I69398 Other sequelae of cerebral infarction: Secondary | ICD-10-CM | POA: Diagnosis not present

## 2017-07-14 DIAGNOSIS — I69851 Hemiplegia and hemiparesis following other cerebrovascular disease affecting right dominant side: Secondary | ICD-10-CM | POA: Diagnosis not present

## 2017-07-14 DIAGNOSIS — I69393 Ataxia following cerebral infarction: Secondary | ICD-10-CM | POA: Diagnosis not present

## 2017-07-14 DIAGNOSIS — I69311 Memory deficit following cerebral infarction: Secondary | ICD-10-CM | POA: Diagnosis not present

## 2017-07-14 DIAGNOSIS — I69320 Aphasia following cerebral infarction: Secondary | ICD-10-CM | POA: Diagnosis not present

## 2017-07-15 ENCOUNTER — Ambulatory Visit (INDEPENDENT_AMBULATORY_CARE_PROVIDER_SITE_OTHER): Payer: Self-pay | Admitting: Vascular Surgery

## 2017-07-15 ENCOUNTER — Encounter: Payer: Self-pay | Admitting: Vascular Surgery

## 2017-07-15 VITALS — BP 90/60 | HR 70 | Temp 97.7°F | Resp 20 | Ht 65.0 in | Wt 190.0 lb

## 2017-07-15 DIAGNOSIS — I69320 Aphasia following cerebral infarction: Secondary | ICD-10-CM | POA: Diagnosis not present

## 2017-07-15 DIAGNOSIS — I69391 Dysphagia following cerebral infarction: Secondary | ICD-10-CM | POA: Diagnosis not present

## 2017-07-15 DIAGNOSIS — I69311 Memory deficit following cerebral infarction: Secondary | ICD-10-CM | POA: Diagnosis not present

## 2017-07-15 DIAGNOSIS — I69851 Hemiplegia and hemiparesis following other cerebrovascular disease affecting right dominant side: Secondary | ICD-10-CM | POA: Diagnosis not present

## 2017-07-15 DIAGNOSIS — I69393 Ataxia following cerebral infarction: Secondary | ICD-10-CM | POA: Diagnosis not present

## 2017-07-15 DIAGNOSIS — I69398 Other sequelae of cerebral infarction: Secondary | ICD-10-CM | POA: Diagnosis not present

## 2017-07-15 DIAGNOSIS — I724 Aneurysm of artery of lower extremity: Secondary | ICD-10-CM

## 2017-07-15 NOTE — Progress Notes (Signed)
Patient is an 83 year old female who returns for follow-up today. 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 Rob Hickman. This was done 06/23/2017. Her husband was present for the office visit today. He states that she is continuing to recover from the stroke. He states that she is eating some. She is getting up out of bed some with the assistance of physical therapy. She had breakdown of her right groin incision. They're currently doing wet-to-dry dressings on this. A culture was taken of this recently which grew out corynebacterium and she was started on Levaquin yesterday for a 7 day course.  Physical exam:  Vitals:   07/15/17 1329  BP: 90/60  Pulse: 70  Resp: 20  Temp: 97.7 F (36.5 C)  TempSrc: Oral  SpO2: 97%  Weight: 190 lb (86.2 kg)  Height: 5\' 5"  (1.651 m)    Neuro: The patient is very lethargic and does not really participate in the office visit today. The patient's husband said she was much more alert earlier but got tired waiting for her office visit.  Extremities: Both feet pink and warm, right groin 4 x 5 x 3 cm depth incision 95% granulation tissue some fibrinous exudate and sutures removed debrided at the bedside today.  Assessment: Right groin wound breakdown after repair of right femoral artery pseudoaneurysm. Wound is clean in appearance does not appear to be infected.  Plan: It would be ideal to place a VAC dressing on this to expedite wound healing. However if this is not possible at her current skilled nursing facility they can continue to do wet-to-dry dressings. The patient will follow-up with me in 4-6 weeks.  Ruta Hinds, MD [facility] Office: [phone] Pager: [phone]

## 2017-07-16 DIAGNOSIS — I69320 Aphasia following cerebral infarction: Secondary | ICD-10-CM | POA: Diagnosis not present

## 2017-07-16 DIAGNOSIS — I69851 Hemiplegia and hemiparesis following other cerebrovascular disease affecting right dominant side: Secondary | ICD-10-CM | POA: Diagnosis not present

## 2017-07-16 DIAGNOSIS — I69391 Dysphagia following cerebral infarction: Secondary | ICD-10-CM | POA: Diagnosis not present

## 2017-07-16 DIAGNOSIS — I69311 Memory deficit following cerebral infarction: Secondary | ICD-10-CM | POA: Diagnosis not present

## 2017-07-16 DIAGNOSIS — I69398 Other sequelae of cerebral infarction: Secondary | ICD-10-CM | POA: Diagnosis not present

## 2017-07-16 DIAGNOSIS — I69393 Ataxia following cerebral infarction: Secondary | ICD-10-CM | POA: Diagnosis not present

## 2017-07-17 DIAGNOSIS — I69311 Memory deficit following cerebral infarction: Secondary | ICD-10-CM | POA: Diagnosis not present

## 2017-07-17 DIAGNOSIS — I69398 Other sequelae of cerebral infarction: Secondary | ICD-10-CM | POA: Diagnosis not present

## 2017-07-17 DIAGNOSIS — I69391 Dysphagia following cerebral infarction: Secondary | ICD-10-CM | POA: Diagnosis not present

## 2017-07-17 DIAGNOSIS — I69393 Ataxia following cerebral infarction: Secondary | ICD-10-CM | POA: Diagnosis not present

## 2017-07-17 DIAGNOSIS — K5900 Constipation, unspecified: Secondary | ICD-10-CM | POA: Diagnosis not present

## 2017-07-17 DIAGNOSIS — I69320 Aphasia following cerebral infarction: Secondary | ICD-10-CM | POA: Diagnosis not present

## 2017-07-17 DIAGNOSIS — I69851 Hemiplegia and hemiparesis following other cerebrovascular disease affecting right dominant side: Secondary | ICD-10-CM | POA: Diagnosis not present

## 2017-07-19 DIAGNOSIS — E669 Obesity, unspecified: Secondary | ICD-10-CM | POA: Diagnosis not present

## 2017-07-19 DIAGNOSIS — R278 Other lack of coordination: Secondary | ICD-10-CM | POA: Diagnosis not present

## 2017-07-19 DIAGNOSIS — I69320 Aphasia following cerebral infarction: Secondary | ICD-10-CM | POA: Diagnosis not present

## 2017-07-19 DIAGNOSIS — R2689 Other abnormalities of gait and mobility: Secondary | ICD-10-CM | POA: Diagnosis not present

## 2017-07-19 DIAGNOSIS — I69398 Other sequelae of cerebral infarction: Secondary | ICD-10-CM | POA: Diagnosis not present

## 2017-07-19 DIAGNOSIS — I69391 Dysphagia following cerebral infarction: Secondary | ICD-10-CM | POA: Diagnosis not present

## 2017-07-19 DIAGNOSIS — I63412 Cerebral infarction due to embolism of left middle cerebral artery: Secondary | ICD-10-CM | POA: Diagnosis not present

## 2017-07-19 DIAGNOSIS — I5022 Chronic systolic (congestive) heart failure: Secondary | ICD-10-CM | POA: Diagnosis not present

## 2017-07-19 DIAGNOSIS — I69851 Hemiplegia and hemiparesis following other cerebrovascular disease affecting right dominant side: Secondary | ICD-10-CM | POA: Diagnosis not present

## 2017-07-19 DIAGNOSIS — I63312 Cerebral infarction due to thrombosis of left middle cerebral artery: Secondary | ICD-10-CM | POA: Diagnosis not present

## 2017-07-19 DIAGNOSIS — M62562 Muscle wasting and atrophy, not elsewhere classified, left lower leg: Secondary | ICD-10-CM | POA: Diagnosis not present

## 2017-07-19 DIAGNOSIS — I69311 Memory deficit following cerebral infarction: Secondary | ICD-10-CM | POA: Diagnosis not present

## 2017-07-19 DIAGNOSIS — I69393 Ataxia following cerebral infarction: Secondary | ICD-10-CM | POA: Diagnosis not present

## 2017-07-19 DIAGNOSIS — M62561 Muscle wasting and atrophy, not elsewhere classified, right lower leg: Secondary | ICD-10-CM | POA: Diagnosis not present

## 2017-07-19 DIAGNOSIS — I724 Aneurysm of artery of lower extremity: Secondary | ICD-10-CM | POA: Diagnosis not present

## 2017-07-20 DIAGNOSIS — I69391 Dysphagia following cerebral infarction: Secondary | ICD-10-CM | POA: Diagnosis not present

## 2017-07-20 DIAGNOSIS — I69320 Aphasia following cerebral infarction: Secondary | ICD-10-CM | POA: Diagnosis not present

## 2017-07-20 DIAGNOSIS — I69398 Other sequelae of cerebral infarction: Secondary | ICD-10-CM | POA: Diagnosis not present

## 2017-07-20 DIAGNOSIS — I69393 Ataxia following cerebral infarction: Secondary | ICD-10-CM | POA: Diagnosis not present

## 2017-07-20 DIAGNOSIS — I69311 Memory deficit following cerebral infarction: Secondary | ICD-10-CM | POA: Diagnosis not present

## 2017-07-20 DIAGNOSIS — I69851 Hemiplegia and hemiparesis following other cerebrovascular disease affecting right dominant side: Secondary | ICD-10-CM | POA: Diagnosis not present

## 2017-07-21 DIAGNOSIS — I69391 Dysphagia following cerebral infarction: Secondary | ICD-10-CM | POA: Diagnosis not present

## 2017-07-21 DIAGNOSIS — I69320 Aphasia following cerebral infarction: Secondary | ICD-10-CM | POA: Diagnosis not present

## 2017-07-21 DIAGNOSIS — I69398 Other sequelae of cerebral infarction: Secondary | ICD-10-CM | POA: Diagnosis not present

## 2017-07-21 DIAGNOSIS — I69851 Hemiplegia and hemiparesis following other cerebrovascular disease affecting right dominant side: Secondary | ICD-10-CM | POA: Diagnosis not present

## 2017-07-21 DIAGNOSIS — I69393 Ataxia following cerebral infarction: Secondary | ICD-10-CM | POA: Diagnosis not present

## 2017-07-21 DIAGNOSIS — I69311 Memory deficit following cerebral infarction: Secondary | ICD-10-CM | POA: Diagnosis not present

## 2017-07-22 DIAGNOSIS — I69391 Dysphagia following cerebral infarction: Secondary | ICD-10-CM | POA: Diagnosis not present

## 2017-07-22 DIAGNOSIS — I69320 Aphasia following cerebral infarction: Secondary | ICD-10-CM | POA: Diagnosis not present

## 2017-07-22 DIAGNOSIS — I69393 Ataxia following cerebral infarction: Secondary | ICD-10-CM | POA: Diagnosis not present

## 2017-07-22 DIAGNOSIS — I69311 Memory deficit following cerebral infarction: Secondary | ICD-10-CM | POA: Diagnosis not present

## 2017-07-22 DIAGNOSIS — I69398 Other sequelae of cerebral infarction: Secondary | ICD-10-CM | POA: Diagnosis not present

## 2017-07-22 DIAGNOSIS — I69851 Hemiplegia and hemiparesis following other cerebrovascular disease affecting right dominant side: Secondary | ICD-10-CM | POA: Diagnosis not present

## 2017-07-23 DIAGNOSIS — I69320 Aphasia following cerebral infarction: Secondary | ICD-10-CM | POA: Diagnosis not present

## 2017-07-23 DIAGNOSIS — I69393 Ataxia following cerebral infarction: Secondary | ICD-10-CM | POA: Diagnosis not present

## 2017-07-23 DIAGNOSIS — I69851 Hemiplegia and hemiparesis following other cerebrovascular disease affecting right dominant side: Secondary | ICD-10-CM | POA: Diagnosis not present

## 2017-07-23 DIAGNOSIS — I69311 Memory deficit following cerebral infarction: Secondary | ICD-10-CM | POA: Diagnosis not present

## 2017-07-23 DIAGNOSIS — I69398 Other sequelae of cerebral infarction: Secondary | ICD-10-CM | POA: Diagnosis not present

## 2017-07-23 DIAGNOSIS — I69391 Dysphagia following cerebral infarction: Secondary | ICD-10-CM | POA: Diagnosis not present

## 2017-07-26 ENCOUNTER — Other Ambulatory Visit (HOSPITAL_COMMUNITY): Payer: Self-pay | Admitting: Interventional Radiology

## 2017-07-26 DIAGNOSIS — I69320 Aphasia following cerebral infarction: Secondary | ICD-10-CM | POA: Diagnosis not present

## 2017-07-26 DIAGNOSIS — I69398 Other sequelae of cerebral infarction: Secondary | ICD-10-CM | POA: Diagnosis not present

## 2017-07-26 DIAGNOSIS — I69391 Dysphagia following cerebral infarction: Secondary | ICD-10-CM | POA: Diagnosis not present

## 2017-07-26 DIAGNOSIS — I69393 Ataxia following cerebral infarction: Secondary | ICD-10-CM | POA: Diagnosis not present

## 2017-07-26 DIAGNOSIS — I69851 Hemiplegia and hemiparesis following other cerebrovascular disease affecting right dominant side: Secondary | ICD-10-CM | POA: Diagnosis not present

## 2017-07-26 DIAGNOSIS — I69311 Memory deficit following cerebral infarction: Secondary | ICD-10-CM | POA: Diagnosis not present

## 2017-07-26 DIAGNOSIS — I639 Cerebral infarction, unspecified: Secondary | ICD-10-CM

## 2017-07-27 DIAGNOSIS — I69391 Dysphagia following cerebral infarction: Secondary | ICD-10-CM | POA: Diagnosis not present

## 2017-07-27 DIAGNOSIS — I69393 Ataxia following cerebral infarction: Secondary | ICD-10-CM | POA: Diagnosis not present

## 2017-07-27 DIAGNOSIS — I69851 Hemiplegia and hemiparesis following other cerebrovascular disease affecting right dominant side: Secondary | ICD-10-CM | POA: Diagnosis not present

## 2017-07-27 DIAGNOSIS — I69311 Memory deficit following cerebral infarction: Secondary | ICD-10-CM | POA: Diagnosis not present

## 2017-07-27 DIAGNOSIS — I69398 Other sequelae of cerebral infarction: Secondary | ICD-10-CM | POA: Diagnosis not present

## 2017-07-27 DIAGNOSIS — I69320 Aphasia following cerebral infarction: Secondary | ICD-10-CM | POA: Diagnosis not present

## 2017-07-28 DIAGNOSIS — I69391 Dysphagia following cerebral infarction: Secondary | ICD-10-CM | POA: Diagnosis not present

## 2017-07-28 DIAGNOSIS — I69851 Hemiplegia and hemiparesis following other cerebrovascular disease affecting right dominant side: Secondary | ICD-10-CM | POA: Diagnosis not present

## 2017-07-28 DIAGNOSIS — I69393 Ataxia following cerebral infarction: Secondary | ICD-10-CM | POA: Diagnosis not present

## 2017-07-28 DIAGNOSIS — I69320 Aphasia following cerebral infarction: Secondary | ICD-10-CM | POA: Diagnosis not present

## 2017-07-28 DIAGNOSIS — I69311 Memory deficit following cerebral infarction: Secondary | ICD-10-CM | POA: Diagnosis not present

## 2017-07-28 DIAGNOSIS — I69398 Other sequelae of cerebral infarction: Secondary | ICD-10-CM | POA: Diagnosis not present

## 2017-07-29 DIAGNOSIS — I69851 Hemiplegia and hemiparesis following other cerebrovascular disease affecting right dominant side: Secondary | ICD-10-CM | POA: Diagnosis not present

## 2017-07-29 DIAGNOSIS — I69398 Other sequelae of cerebral infarction: Secondary | ICD-10-CM | POA: Diagnosis not present

## 2017-07-29 DIAGNOSIS — I69320 Aphasia following cerebral infarction: Secondary | ICD-10-CM | POA: Diagnosis not present

## 2017-07-29 DIAGNOSIS — I69393 Ataxia following cerebral infarction: Secondary | ICD-10-CM | POA: Diagnosis not present

## 2017-07-29 DIAGNOSIS — I69311 Memory deficit following cerebral infarction: Secondary | ICD-10-CM | POA: Diagnosis not present

## 2017-07-29 DIAGNOSIS — I69391 Dysphagia following cerebral infarction: Secondary | ICD-10-CM | POA: Diagnosis not present

## 2017-07-30 DIAGNOSIS — I69391 Dysphagia following cerebral infarction: Secondary | ICD-10-CM | POA: Diagnosis not present

## 2017-07-30 DIAGNOSIS — I69398 Other sequelae of cerebral infarction: Secondary | ICD-10-CM | POA: Diagnosis not present

## 2017-07-30 DIAGNOSIS — I69311 Memory deficit following cerebral infarction: Secondary | ICD-10-CM | POA: Diagnosis not present

## 2017-07-30 DIAGNOSIS — I69851 Hemiplegia and hemiparesis following other cerebrovascular disease affecting right dominant side: Secondary | ICD-10-CM | POA: Diagnosis not present

## 2017-07-30 DIAGNOSIS — I69393 Ataxia following cerebral infarction: Secondary | ICD-10-CM | POA: Diagnosis not present

## 2017-07-30 DIAGNOSIS — I69320 Aphasia following cerebral infarction: Secondary | ICD-10-CM | POA: Diagnosis not present

## 2017-07-31 DIAGNOSIS — I69320 Aphasia following cerebral infarction: Secondary | ICD-10-CM | POA: Diagnosis not present

## 2017-07-31 DIAGNOSIS — I69393 Ataxia following cerebral infarction: Secondary | ICD-10-CM | POA: Diagnosis not present

## 2017-07-31 DIAGNOSIS — I69391 Dysphagia following cerebral infarction: Secondary | ICD-10-CM | POA: Diagnosis not present

## 2017-07-31 DIAGNOSIS — I69851 Hemiplegia and hemiparesis following other cerebrovascular disease affecting right dominant side: Secondary | ICD-10-CM | POA: Diagnosis not present

## 2017-07-31 DIAGNOSIS — I69398 Other sequelae of cerebral infarction: Secondary | ICD-10-CM | POA: Diagnosis not present

## 2017-07-31 DIAGNOSIS — I69311 Memory deficit following cerebral infarction: Secondary | ICD-10-CM | POA: Diagnosis not present

## 2017-08-02 DIAGNOSIS — I69320 Aphasia following cerebral infarction: Secondary | ICD-10-CM | POA: Diagnosis not present

## 2017-08-02 DIAGNOSIS — I69398 Other sequelae of cerebral infarction: Secondary | ICD-10-CM | POA: Diagnosis not present

## 2017-08-02 DIAGNOSIS — I69393 Ataxia following cerebral infarction: Secondary | ICD-10-CM | POA: Diagnosis not present

## 2017-08-02 DIAGNOSIS — I69391 Dysphagia following cerebral infarction: Secondary | ICD-10-CM | POA: Diagnosis not present

## 2017-08-02 DIAGNOSIS — I69851 Hemiplegia and hemiparesis following other cerebrovascular disease affecting right dominant side: Secondary | ICD-10-CM | POA: Diagnosis not present

## 2017-08-02 DIAGNOSIS — I69311 Memory deficit following cerebral infarction: Secondary | ICD-10-CM | POA: Diagnosis not present

## 2017-08-03 DIAGNOSIS — I69851 Hemiplegia and hemiparesis following other cerebrovascular disease affecting right dominant side: Secondary | ICD-10-CM | POA: Diagnosis not present

## 2017-08-03 DIAGNOSIS — I69391 Dysphagia following cerebral infarction: Secondary | ICD-10-CM | POA: Diagnosis not present

## 2017-08-03 DIAGNOSIS — I69320 Aphasia following cerebral infarction: Secondary | ICD-10-CM | POA: Diagnosis not present

## 2017-08-03 DIAGNOSIS — I69393 Ataxia following cerebral infarction: Secondary | ICD-10-CM | POA: Diagnosis not present

## 2017-08-03 DIAGNOSIS — I69398 Other sequelae of cerebral infarction: Secondary | ICD-10-CM | POA: Diagnosis not present

## 2017-08-03 DIAGNOSIS — I69311 Memory deficit following cerebral infarction: Secondary | ICD-10-CM | POA: Diagnosis not present

## 2017-08-04 DIAGNOSIS — I69391 Dysphagia following cerebral infarction: Secondary | ICD-10-CM | POA: Diagnosis not present

## 2017-08-04 DIAGNOSIS — I69311 Memory deficit following cerebral infarction: Secondary | ICD-10-CM | POA: Diagnosis not present

## 2017-08-04 DIAGNOSIS — I69393 Ataxia following cerebral infarction: Secondary | ICD-10-CM | POA: Diagnosis not present

## 2017-08-04 DIAGNOSIS — I69398 Other sequelae of cerebral infarction: Secondary | ICD-10-CM | POA: Diagnosis not present

## 2017-08-04 DIAGNOSIS — I69851 Hemiplegia and hemiparesis following other cerebrovascular disease affecting right dominant side: Secondary | ICD-10-CM | POA: Diagnosis not present

## 2017-08-04 DIAGNOSIS — I69320 Aphasia following cerebral infarction: Secondary | ICD-10-CM | POA: Diagnosis not present

## 2017-08-05 DIAGNOSIS — I69320 Aphasia following cerebral infarction: Secondary | ICD-10-CM | POA: Diagnosis not present

## 2017-08-05 DIAGNOSIS — I69393 Ataxia following cerebral infarction: Secondary | ICD-10-CM | POA: Diagnosis not present

## 2017-08-05 DIAGNOSIS — I69398 Other sequelae of cerebral infarction: Secondary | ICD-10-CM | POA: Diagnosis not present

## 2017-08-05 DIAGNOSIS — I69391 Dysphagia following cerebral infarction: Secondary | ICD-10-CM | POA: Diagnosis not present

## 2017-08-05 DIAGNOSIS — I69311 Memory deficit following cerebral infarction: Secondary | ICD-10-CM | POA: Diagnosis not present

## 2017-08-05 DIAGNOSIS — I69851 Hemiplegia and hemiparesis following other cerebrovascular disease affecting right dominant side: Secondary | ICD-10-CM | POA: Diagnosis not present

## 2017-08-06 DIAGNOSIS — I69311 Memory deficit following cerebral infarction: Secondary | ICD-10-CM | POA: Diagnosis not present

## 2017-08-06 DIAGNOSIS — I69393 Ataxia following cerebral infarction: Secondary | ICD-10-CM | POA: Diagnosis not present

## 2017-08-06 DIAGNOSIS — I69391 Dysphagia following cerebral infarction: Secondary | ICD-10-CM | POA: Diagnosis not present

## 2017-08-06 DIAGNOSIS — I69320 Aphasia following cerebral infarction: Secondary | ICD-10-CM | POA: Diagnosis not present

## 2017-08-06 DIAGNOSIS — I69398 Other sequelae of cerebral infarction: Secondary | ICD-10-CM | POA: Diagnosis not present

## 2017-08-06 DIAGNOSIS — I69851 Hemiplegia and hemiparesis following other cerebrovascular disease affecting right dominant side: Secondary | ICD-10-CM | POA: Diagnosis not present

## 2017-08-08 DIAGNOSIS — I69393 Ataxia following cerebral infarction: Secondary | ICD-10-CM | POA: Diagnosis not present

## 2017-08-08 DIAGNOSIS — I69391 Dysphagia following cerebral infarction: Secondary | ICD-10-CM | POA: Diagnosis not present

## 2017-08-08 DIAGNOSIS — I69311 Memory deficit following cerebral infarction: Secondary | ICD-10-CM | POA: Diagnosis not present

## 2017-08-08 DIAGNOSIS — I69398 Other sequelae of cerebral infarction: Secondary | ICD-10-CM | POA: Diagnosis not present

## 2017-08-08 DIAGNOSIS — I69320 Aphasia following cerebral infarction: Secondary | ICD-10-CM | POA: Diagnosis not present

## 2017-08-08 DIAGNOSIS — I69851 Hemiplegia and hemiparesis following other cerebrovascular disease affecting right dominant side: Secondary | ICD-10-CM | POA: Diagnosis not present

## 2017-08-09 DIAGNOSIS — I69393 Ataxia following cerebral infarction: Secondary | ICD-10-CM | POA: Diagnosis not present

## 2017-08-09 DIAGNOSIS — I69311 Memory deficit following cerebral infarction: Secondary | ICD-10-CM | POA: Diagnosis not present

## 2017-08-09 DIAGNOSIS — I69320 Aphasia following cerebral infarction: Secondary | ICD-10-CM | POA: Diagnosis not present

## 2017-08-09 DIAGNOSIS — I69851 Hemiplegia and hemiparesis following other cerebrovascular disease affecting right dominant side: Secondary | ICD-10-CM | POA: Diagnosis not present

## 2017-08-09 DIAGNOSIS — I69391 Dysphagia following cerebral infarction: Secondary | ICD-10-CM | POA: Diagnosis not present

## 2017-08-09 DIAGNOSIS — I69398 Other sequelae of cerebral infarction: Secondary | ICD-10-CM | POA: Diagnosis not present

## 2017-08-10 ENCOUNTER — Telehealth (HOSPITAL_COMMUNITY): Payer: Self-pay

## 2017-08-10 DIAGNOSIS — I69393 Ataxia following cerebral infarction: Secondary | ICD-10-CM | POA: Diagnosis not present

## 2017-08-10 DIAGNOSIS — I69851 Hemiplegia and hemiparesis following other cerebrovascular disease affecting right dominant side: Secondary | ICD-10-CM | POA: Diagnosis not present

## 2017-08-10 DIAGNOSIS — I69311 Memory deficit following cerebral infarction: Secondary | ICD-10-CM | POA: Diagnosis not present

## 2017-08-10 DIAGNOSIS — I69391 Dysphagia following cerebral infarction: Secondary | ICD-10-CM | POA: Diagnosis not present

## 2017-08-10 DIAGNOSIS — I69320 Aphasia following cerebral infarction: Secondary | ICD-10-CM | POA: Diagnosis not present

## 2017-08-10 DIAGNOSIS — I69398 Other sequelae of cerebral infarction: Secondary | ICD-10-CM | POA: Diagnosis not present

## 2017-08-10 NOTE — Telephone Encounter (Signed)
Called to reschedule consult, left message for pt to return call. AW 

## 2017-08-11 ENCOUNTER — Ambulatory Visit (HOSPITAL_COMMUNITY): Payer: Medicare Other

## 2017-08-11 DIAGNOSIS — I69851 Hemiplegia and hemiparesis following other cerebrovascular disease affecting right dominant side: Secondary | ICD-10-CM | POA: Diagnosis not present

## 2017-08-11 DIAGNOSIS — I69393 Ataxia following cerebral infarction: Secondary | ICD-10-CM | POA: Diagnosis not present

## 2017-08-11 DIAGNOSIS — I69320 Aphasia following cerebral infarction: Secondary | ICD-10-CM | POA: Diagnosis not present

## 2017-08-11 DIAGNOSIS — I69311 Memory deficit following cerebral infarction: Secondary | ICD-10-CM | POA: Diagnosis not present

## 2017-08-11 DIAGNOSIS — I69391 Dysphagia following cerebral infarction: Secondary | ICD-10-CM | POA: Diagnosis not present

## 2017-08-11 DIAGNOSIS — I69398 Other sequelae of cerebral infarction: Secondary | ICD-10-CM | POA: Diagnosis not present

## 2017-08-12 ENCOUNTER — Ambulatory Visit (INDEPENDENT_AMBULATORY_CARE_PROVIDER_SITE_OTHER): Payer: Self-pay | Admitting: Vascular Surgery

## 2017-08-12 ENCOUNTER — Encounter: Payer: Self-pay | Admitting: Vascular Surgery

## 2017-08-12 VITALS — BP 132/84 | HR 97 | Temp 98.0°F | Resp 18 | Ht 65.0 in | Wt 190.0 lb

## 2017-08-12 DIAGNOSIS — I69398 Other sequelae of cerebral infarction: Secondary | ICD-10-CM | POA: Diagnosis not present

## 2017-08-12 DIAGNOSIS — I69851 Hemiplegia and hemiparesis following other cerebrovascular disease affecting right dominant side: Secondary | ICD-10-CM | POA: Diagnosis not present

## 2017-08-12 DIAGNOSIS — I724 Aneurysm of artery of lower extremity: Secondary | ICD-10-CM

## 2017-08-12 DIAGNOSIS — T81718A Complication of other artery following a procedure, not elsewhere classified, initial encounter: Secondary | ICD-10-CM

## 2017-08-12 DIAGNOSIS — I69393 Ataxia following cerebral infarction: Secondary | ICD-10-CM | POA: Diagnosis not present

## 2017-08-12 DIAGNOSIS — I69311 Memory deficit following cerebral infarction: Secondary | ICD-10-CM | POA: Diagnosis not present

## 2017-08-12 DIAGNOSIS — I69391 Dysphagia following cerebral infarction: Secondary | ICD-10-CM | POA: Diagnosis not present

## 2017-08-12 DIAGNOSIS — I69320 Aphasia following cerebral infarction: Secondary | ICD-10-CM | POA: Diagnosis not present

## 2017-08-12 DIAGNOSIS — Z23 Encounter for immunization: Secondary | ICD-10-CM | POA: Diagnosis not present

## 2017-08-12 NOTE — Progress Notes (Signed)
Patient is an 83 year old female who returns for follow-up today. 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 Rob Hickman. This was done 06/23/2017. Her husband was present for the office visit today. He states that she is continuing to recover from the stroke. He states that she is eating some. She is getting up out of bed some with the assistance of physical therapy. She had breakdown of her right groin incision. They're currently doing wet-to-dry dressings on this.   Physical exam:  Vitals:   08/12/17 1046  BP: 132/84  Pulse: 97  Resp: 18  Temp: 98 F (36.7 C)  TempSrc: Oral  SpO2: 96%  Weight: 190 lb (86.2 kg)  Height: 5\' 5"  (1.651 m)    Right groin wound 3 x 3 x 1 cm depth healthy-appearing 100% DVT granulation tissue  Assessment: Slowly healing right groin wound but making significant progress since her last office visit.  Plan: Follow-up for 6 weeks to recheck the wound. Continue current wound care.  Ruta Hinds, MD [facility] [place] Office: [phone] Pager: [phone]

## 2017-08-13 DIAGNOSIS — I69398 Other sequelae of cerebral infarction: Secondary | ICD-10-CM | POA: Diagnosis not present

## 2017-08-13 DIAGNOSIS — I69320 Aphasia following cerebral infarction: Secondary | ICD-10-CM | POA: Diagnosis not present

## 2017-08-13 DIAGNOSIS — I69391 Dysphagia following cerebral infarction: Secondary | ICD-10-CM | POA: Diagnosis not present

## 2017-08-13 DIAGNOSIS — I69311 Memory deficit following cerebral infarction: Secondary | ICD-10-CM | POA: Diagnosis not present

## 2017-08-13 DIAGNOSIS — I69393 Ataxia following cerebral infarction: Secondary | ICD-10-CM | POA: Diagnosis not present

## 2017-08-13 DIAGNOSIS — I69851 Hemiplegia and hemiparesis following other cerebrovascular disease affecting right dominant side: Secondary | ICD-10-CM | POA: Diagnosis not present

## 2017-08-16 ENCOUNTER — Ambulatory Visit (HOSPITAL_COMMUNITY): Payer: Medicare Other

## 2017-08-16 DIAGNOSIS — I69391 Dysphagia following cerebral infarction: Secondary | ICD-10-CM | POA: Diagnosis not present

## 2017-08-16 DIAGNOSIS — I69398 Other sequelae of cerebral infarction: Secondary | ICD-10-CM | POA: Diagnosis not present

## 2017-08-16 DIAGNOSIS — I69851 Hemiplegia and hemiparesis following other cerebrovascular disease affecting right dominant side: Secondary | ICD-10-CM | POA: Diagnosis not present

## 2017-08-16 DIAGNOSIS — I69311 Memory deficit following cerebral infarction: Secondary | ICD-10-CM | POA: Diagnosis not present

## 2017-08-16 DIAGNOSIS — I69393 Ataxia following cerebral infarction: Secondary | ICD-10-CM | POA: Diagnosis not present

## 2017-08-16 DIAGNOSIS — I69320 Aphasia following cerebral infarction: Secondary | ICD-10-CM | POA: Diagnosis not present

## 2017-08-17 DIAGNOSIS — I69391 Dysphagia following cerebral infarction: Secondary | ICD-10-CM | POA: Diagnosis not present

## 2017-08-17 DIAGNOSIS — I69320 Aphasia following cerebral infarction: Secondary | ICD-10-CM | POA: Diagnosis not present

## 2017-08-17 DIAGNOSIS — I69398 Other sequelae of cerebral infarction: Secondary | ICD-10-CM | POA: Diagnosis not present

## 2017-08-17 DIAGNOSIS — I69393 Ataxia following cerebral infarction: Secondary | ICD-10-CM | POA: Diagnosis not present

## 2017-08-17 DIAGNOSIS — I69851 Hemiplegia and hemiparesis following other cerebrovascular disease affecting right dominant side: Secondary | ICD-10-CM | POA: Diagnosis not present

## 2017-08-17 DIAGNOSIS — I69311 Memory deficit following cerebral infarction: Secondary | ICD-10-CM | POA: Diagnosis not present

## 2017-08-18 DIAGNOSIS — I69851 Hemiplegia and hemiparesis following other cerebrovascular disease affecting right dominant side: Secondary | ICD-10-CM | POA: Diagnosis not present

## 2017-08-18 DIAGNOSIS — I69311 Memory deficit following cerebral infarction: Secondary | ICD-10-CM | POA: Diagnosis not present

## 2017-08-18 DIAGNOSIS — I69393 Ataxia following cerebral infarction: Secondary | ICD-10-CM | POA: Diagnosis not present

## 2017-08-18 DIAGNOSIS — I69391 Dysphagia following cerebral infarction: Secondary | ICD-10-CM | POA: Diagnosis not present

## 2017-08-18 DIAGNOSIS — I69320 Aphasia following cerebral infarction: Secondary | ICD-10-CM | POA: Diagnosis not present

## 2017-08-18 DIAGNOSIS — I69398 Other sequelae of cerebral infarction: Secondary | ICD-10-CM | POA: Diagnosis not present

## 2017-08-19 ENCOUNTER — Non-Acute Institutional Stay (SKILLED_NURSING_FACILITY): Payer: Medicare Other | Admitting: Adult Health

## 2017-08-19 ENCOUNTER — Encounter: Payer: Self-pay | Admitting: Adult Health

## 2017-08-19 DIAGNOSIS — I255 Ischemic cardiomyopathy: Secondary | ICD-10-CM | POA: Diagnosis not present

## 2017-08-19 DIAGNOSIS — Z9861 Coronary angioplasty status: Secondary | ICD-10-CM

## 2017-08-19 DIAGNOSIS — I10 Essential (primary) hypertension: Secondary | ICD-10-CM | POA: Diagnosis not present

## 2017-08-19 DIAGNOSIS — R4701 Aphasia: Secondary | ICD-10-CM

## 2017-08-19 DIAGNOSIS — K5900 Constipation, unspecified: Secondary | ICD-10-CM | POA: Insufficient documentation

## 2017-08-19 DIAGNOSIS — I5022 Chronic systolic (congestive) heart failure: Secondary | ICD-10-CM | POA: Diagnosis not present

## 2017-08-19 DIAGNOSIS — S31109D Unspecified open wound of abdominal wall, unspecified quadrant without penetration into peritoneal cavity, subsequent encounter: Secondary | ICD-10-CM | POA: Diagnosis not present

## 2017-08-19 DIAGNOSIS — E785 Hyperlipidemia, unspecified: Secondary | ICD-10-CM | POA: Insufficient documentation

## 2017-08-19 DIAGNOSIS — S31109A Unspecified open wound of abdominal wall, unspecified quadrant without penetration into peritoneal cavity, initial encounter: Secondary | ICD-10-CM | POA: Insufficient documentation

## 2017-08-19 DIAGNOSIS — I724 Aneurysm of artery of lower extremity: Secondary | ICD-10-CM | POA: Diagnosis not present

## 2017-08-19 DIAGNOSIS — I639 Cerebral infarction, unspecified: Secondary | ICD-10-CM

## 2017-08-19 DIAGNOSIS — R1310 Dysphagia, unspecified: Secondary | ICD-10-CM

## 2017-08-19 DIAGNOSIS — E782 Mixed hyperlipidemia: Secondary | ICD-10-CM

## 2017-08-19 DIAGNOSIS — I69320 Aphasia following cerebral infarction: Secondary | ICD-10-CM | POA: Diagnosis not present

## 2017-08-19 DIAGNOSIS — K5901 Slow transit constipation: Secondary | ICD-10-CM | POA: Diagnosis not present

## 2017-08-19 DIAGNOSIS — R2689 Other abnormalities of gait and mobility: Secondary | ICD-10-CM | POA: Diagnosis not present

## 2017-08-19 DIAGNOSIS — I69393 Ataxia following cerebral infarction: Secondary | ICD-10-CM | POA: Diagnosis not present

## 2017-08-19 DIAGNOSIS — R278 Other lack of coordination: Secondary | ICD-10-CM | POA: Diagnosis not present

## 2017-08-19 DIAGNOSIS — I69398 Other sequelae of cerebral infarction: Secondary | ICD-10-CM | POA: Diagnosis not present

## 2017-08-19 DIAGNOSIS — M62561 Muscle wasting and atrophy, not elsewhere classified, right lower leg: Secondary | ICD-10-CM | POA: Diagnosis not present

## 2017-08-19 DIAGNOSIS — E039 Hypothyroidism, unspecified: Secondary | ICD-10-CM

## 2017-08-19 DIAGNOSIS — I2510 Atherosclerotic heart disease of native coronary artery without angina pectoris: Secondary | ICD-10-CM | POA: Diagnosis not present

## 2017-08-19 DIAGNOSIS — M62562 Muscle wasting and atrophy, not elsewhere classified, left lower leg: Secondary | ICD-10-CM | POA: Diagnosis not present

## 2017-08-19 DIAGNOSIS — I63312 Cerebral infarction due to thrombosis of left middle cerebral artery: Secondary | ICD-10-CM | POA: Diagnosis not present

## 2017-08-19 DIAGNOSIS — E669 Obesity, unspecified: Secondary | ICD-10-CM | POA: Diagnosis not present

## 2017-08-19 DIAGNOSIS — I69391 Dysphagia following cerebral infarction: Secondary | ICD-10-CM | POA: Diagnosis not present

## 2017-08-19 DIAGNOSIS — I69311 Memory deficit following cerebral infarction: Secondary | ICD-10-CM | POA: Diagnosis not present

## 2017-08-19 DIAGNOSIS — I69851 Hemiplegia and hemiparesis following other cerebrovascular disease affecting right dominant side: Secondary | ICD-10-CM | POA: Diagnosis not present

## 2017-08-19 DIAGNOSIS — I63412 Cerebral infarction due to embolism of left middle cerebral artery: Secondary | ICD-10-CM | POA: Diagnosis not present

## 2017-08-19 NOTE — Assessment & Plan Note (Signed)
Continue crestor and monitor LDL (goal would be most likely <70).  It was 38 in Sept 2018.

## 2017-08-19 NOTE — Assessment & Plan Note (Signed)
Has made significant improvement. Continue daily dressing changes with hydrogel and monitor.

## 2017-08-19 NOTE — Progress Notes (Signed)
Location:  Occupational psychologist of Service:  SNF (31) Provider:   Cindi Carbon, [place] [phone]   Josetta Huddle, MD  Patient Care Team: Josetta Huddle, MD as PCP - General (Internal Medicine) [facility], Leonie Green, MD as Consulting Physician (Cardiology) Warden Fillers, MD as Consulting Physician (Ophthalmology) Belva Crome, MD as Consulting Physician (Cardiology) Gayland Curry, DO as Consulting Physician (Geriatric Medicine) Elam Dutch, MD as Consulting Physician (Vascular Surgery) Garvin Fila, MD as Consulting Physician (Neurology)  Extended Emergency Contact Information Primary Emergency Contact: Ulice Dash Address: [address]          [place], [place] [address] Johnnette Litter of [place] Phone: [phone] Work Phone: [phone] Mobile Phone: [phone] Relation: Spouse Secondary Emergency Contact: Puder,Blake Address: [address]          [place], [place] [address] Johnnette Litter of [place] Phone: [phone] Mobile Phone: [phone] Relation: Son  Code Status:  DNR Goals of care: Advanced Directive information Advanced Directives 08/19/2017  Does Patient Have a Medical Advance Directive? Yes  Type of Advance Directive Out of facility DNR (pink MOST or yellow form);[place];Living will  Does patient want to make changes to medical advance directive? -  Copy of [place] in Chart? Yes  Would patient like information on creating a medical advance directive? -  Pre-existing out of facility DNR order (yellow form or pink MOST form) Yellow form placed in chart (order not valid for inpatient use);Pink MOST form placed in chart (order not valid for inpatient use)     Chief Complaint  Patient presents with  . Medical Management of Chronic Issues    HPI:  Pt is a 83 y.o. female seen today for medical management of  chronic diseases.  She currently resides in skilled care after a hospitalization due to an acute ischemic stroke of the left MCA and right MCA punctate.  She is s/p TPA and mechanical thrombectomy of the left MCA.    Dysphagia Advanced to regular diet and tolerating well.  Remains with expressive aphasia and will need skilled care.   Wound: due to infection after access to perform the above procedure due to her CVA which led to a pseudo aneurysm which was repaired by Dr. Oneida Alar.  Wound improved to right groin, dressing changes with hydrogel dressings changed daily  Mobility: no obvious one sided weakness at this point. She is able to walk with a walker with assistance.   She has been overweight but due to decreased intake she has lost 4 lbs.   Wt Readings from Last 3 Encounters:  08/19/17 186 lb 12.8 oz (84.7 kg)  08/12/17 190 lb (86.2 kg)  07/15/17 190 lb (86.2 kg)   Constipation: the staff reports that she continues with difficulty having BMs and sometimes is not compliant taking the miralax.      Past Medical History:  Diagnosis Date  . Anemia   . CAD (coronary artery disease)    a. STEMI 10/09/13 - 100% LAD - following initial angioplasty, ? Spontaneous dissection vs related to ballon. s/p complex PCI with 2 overlapping DES to LAD. Adm complicated by VDRF, cardiogenic shock, peri-MI PAF with NSR on amiodarone. Residual mod RCA disease.   Marland Kitchen CAD S/P percutaneous coronary angioplasty 10/09/13   DES x 2 to Mid LAD, 2.5 x 38, 2.5 x 12 Promus (covering extensive disection), moderate RCA disesase  . Cardiogenic shock ([place])  a. 09/2013 - during admission for STEMI.  . Depression   . Dyslipidemia   . H/O Acute respiratory failure due to hypoxia 10/10/2013   a. 09/2013 - secondary to anterior STEMI related pulmonary edema (resolved).  . Hypertension   . Hypothyroidism   . Ischemic cardiomyopathy    a. EF 30-35% during 09/2013 adm for STEMI. b. Repeat Echo EF 40-45% with distal LAD  hypokinesis (10/16/14 following STEMI recovery).  . Migraine headache    a. Remotely.  . Orthostatic hypotension    a. Adm 10/2013 for this, felt due to Lasix. Not on regular diuretic due to this/dehydration.  Marland Kitchen PAF (paroxysmal atrial fibrillation) ([place])    a. 09/2013 - peri-MI, NSR at discharge on amiodarone.  . Sinus bradycardia    Past Surgical History:  Procedure Laterality Date  . CORONARY ANGIOPLASTY WITH STENT PLACEMENT  10/09/13   2 overlapping Promus DES to mid LAD, occlusion/dissection (2.5 x 38, 2.5 x 12)  . FEMORAL ARTERY EXPLORATION Right 06/23/2017   Procedure: repair right femoral pseudoanerysm; evacuation hemotoma;  Surgeon: Elam Dutch, MD;  Location: [facility] OR;  Service: Vascular;  Laterality: Right;  . IR ANGIO INTRA EXTRACRAN SEL COM CAROTID INNOMINATE UNI R MOD SED  06/20/2017  . IR PERCUTANEOUS ART THROMBECTOMY/INFUSION INTRACRANIAL INC DIAG ANGIO  06/20/2017  . IR US GUIDE BX ASP/DRAIN  06/23/2017  . LEFT HEART CATHETERIZATION WITH CORONARY ANGIOGRAM N/A 10/09/2013   Procedure: LEFT HEART CATHETERIZATION WITH CORONARY ANGIOGRAM;  Surgeon: Leonie Man, MD;  Location: [facility] CATH LAB;  Service: Cardiovascular;  Laterality: N/A;  . RADIOLOGY WITH ANESTHESIA N/A 06/20/2017   Procedure: RADIOLOGY WITH ANESTHESIA;  Surgeon: Luanne Bras, MD;  Location: [place];  Service: Radiology;  Laterality: N/A;  . SHOULDER SURGERY      Allergies  Allergen Reactions  . Ace Inhibitors Anaphylaxis  . Beta Adrenergic Blockers Anaphylaxis  . Iohexol Anaphylaxis     Code: HIVES, Desc: anaphylactic shock s/p contrast injection many yrs ago--suggested that pt NEVER have iv contrast//a.c., Onset Date: [id number]   . Atorvastatin Rash  . Latex Rash  . Penicillins Swelling and Rash    Details are unknown  . Sulfa Antibiotics Rash    Outpatient Encounter Prescriptions as of 08/19/2017  Medication Sig  . polyethylene glycol (MIRALAX / GLYCOLAX) packet Take 17 g by mouth daily.  . Protein  (UNJURY PO) Take 0.5 scoop by mouth 2 (two) times daily.  . sennosides-docusate sodium (SENOKOT-S) 8.6-50 MG tablet Take 2 tablets by mouth at bedtime.  Marland Kitchen acetaminophen (TYLENOL) 325 MG tablet Take 650 mg by mouth every 6 (six) hours as needed for mild pain.  Marland Kitchen apixaban (ELIQUIS) 5 MG TABS tablet Take 1 tablet (5 mg total) by mouth 2 (two) times daily.  Marland Kitchen aspirin 81 MG chewable tablet Chew 1 tablet (81 mg total) by mouth daily.  . Biotin 1000 MCG tablet Take 1,000 mcg by mouth daily.  Marland Kitchen buPROPion (WELLBUTRIN SR) 150 MG 12 hr tablet Take 150 mg by mouth every morning.   . calcium-vitamin D (OSCAL WITH D) 500-200 MG-UNIT per tablet Take 1 tablet by mouth daily.   . carvedilol (COREG) 6.25 MG tablet Take 1 tablet (6.25 mg total) by mouth 2 (two) times daily with a meal.  . cholecalciferol (VITAMIN D) 1000 UNITS tablet Take 1,000 Units by mouth daily.  . clobetasol ointment (TEMOVATE) 2.40 % Apply 1 application topically 2 (two) times daily as needed. (affected areas)--also thighs  . fexofenadine (ALLEGRA) 60 MG tablet  Take 60 mg by mouth 2 (two) times daily as needed for allergies or rhinitis.  . furosemide (LASIX) 20 MG tablet Take 0.5 tablets (10 mg total) by mouth daily.  . montelukast (SINGULAIR) 10 MG tablet Take 10 mg by mouth at bedtime.  . Multiple Vitamin (MULTIVITAMIN WITH MINERALS) TABS tablet Take 1 tablet by mouth daily.  . nitroGLYCERIN (NITROSTAT) 0.4 MG SL tablet Place 0.4 mg under the tongue every 5 (five) minutes as needed for chest pain.  Marland Kitchen omeprazole (PRILOSEC) 20 MG capsule Take 20 mg by mouth daily.   [name] Glycol-Propyl Glycol (SYSTANE OP) Apply 1 drop to eye daily as needed (dry eyes).  . rosuvastatin (CRESTOR) 10 MG tablet Take 1 tablet (10 mg total) by mouth 3 (three) times a week. (Mondays, Wednesdays, and Fridays)  . sertraline (ZOLOFT) 100 MG tablet Take 100 mg by mouth daily.   Marland Kitchen SYNTHROID 100 MCG tablet TAKE 1 TABLET (187mcg) IN THE MORNING ON AN EMPTY STOMACH.    . vitamin E (VITAMIN E) 400 UNIT capsule Take 400 Units by mouth daily.  . [DISCONTINUED] ALPRAZolam (XANAX) 0.25 MG tablet TAKE HALF TABLET (0.125mg ) TWICE DAILY AS NEEDED FOR ANXIETY.   Facility-Administered Encounter Medications as of 08/19/2017  Medication  . etomidate (AMIDATE) injection  . rocuronium (ZEMURON) injection  . succinylcholine (ANECTINE) injection    Review of Systems  Constitutional: Negative for activity change, appetite change, chills, diaphoresis, fatigue, fever and unexpected weight change.  HENT: Negative for congestion.   Respiratory: Negative for cough, shortness of breath and wheezing.   Cardiovascular: Negative for chest pain, palpitations and leg swelling.  Gastrointestinal: Negative for abdominal distention, abdominal pain, constipation and diarrhea.  Genitourinary: Negative for difficulty urinating and dysuria.  Musculoskeletal: Positive for gait problem. Negative for arthralgias, back pain, joint swelling and myalgias.  Neurological: Positive for speech difficulty and weakness. Negative for dizziness, tremors, seizures, syncope, facial asymmetry, light-headedness, numbness and headaches.  Psychiatric/Behavioral: Positive for confusion. Negative for agitation, behavioral problems and dysphoric mood.    Immunization History  Administered Date(s) Administered  . Influenza Split 08/06/2010, 08/05/2011, 07/26/2012  . Influenza,inj,Quad PF,6+ Mos 08/20/2016  . Pneumococcal Conjugate-13 04/04/2014   Pertinent  Health Maintenance Due  Topic Date Due  . DEXA SCAN  10/16/1997  . PNA vac Low Risk Adult (2 of 2 - PPSV23) 04/05/2015  . INFLUENZA VACCINE  05/19/2017   Fall Risk  01/20/2017 09/16/2016 07/29/2016  Falls in the past year? No No No   Functional Status Survey:    Vitals:   08/19/17 1001  BP: 135/71  Pulse: 71  Resp: 20  Temp: (!) 97.1 F (36.2 C)  Weight: 186 lb 12.8 oz (84.7 kg)   There is no height or weight on file to calculate BMI.   Wt Readings from Last 3 Encounters:  08/19/17 186 lb 12.8 oz (84.7 kg)  08/12/17 190 lb (86.2 kg)  07/15/17 190 lb (86.2 kg)    Physical Exam  Constitutional: No distress.  HENT:  Head: Normocephalic and atraumatic.  Left Ear: External ear normal.  Nose: Nose normal.  Mouth/Throat: Oropharynx is clear and moist. No oropharyngeal exudate.  Right ear with cerumen impaction  Eyes: Pupils are equal, round, and reactive to light. Conjunctivae and EOM are normal. Right eye exhibits no discharge. Left eye exhibits no discharge.  Neck: No JVD present.  Cardiovascular: Normal rate and regular rhythm.   No murmur heard. Pulmonary/Chest: Effort normal and breath sounds normal. No respiratory distress. She has no  wheezes.  Abdominal: Soft. Bowel sounds are normal.  Neurological: She is alert. A cranial nerve deficit is present.  Oriented to self only. Able to follow commands. Dysphonia noted and expressive aphasia appreciated.   Skin: Skin is warm and dry. She is not diaphoretic.  Right groin wound with 100% pink tissue and no drainage or odor.   Psychiatric: She has a normal mood and affect.    Labs reviewed:  Recent Labs  06/27/17 1725 06/28/17 0510 06/28/17 1745 06/29/17 0431 06/30/17 0413 07/02/17 0651 07/03/17 0257  NA 140 142  --  140 141 141 145  K 2.8* 3.5  --  3.5 3.4* 3.0* 3.0*  CL 104 107  --  107 103 103 104  CO2 28 30  --  29 33* 30 33*  GLUCOSE 104* 142*  --  139* 115* 146* 127*  BUN 28* 34*  --  33* 20 29* 35*  CREATININE 0.55 0.63  --  0.62 0.69 0.82 0.86  CALCIUM 8.7* 9.1  --  9.3 9.5 10.1 10.5*  MG 2.1 1.8  --   --  1.6*  --   --   PHOS 1.9* 1.6* 3.5 2.1* 2.8  --   --     Recent Labs  01/26/17 0855 06/20/17 0819 06/21/17 0349 06/24/17 0410  AST 23 26  --   --   ALT 20 23  --   --   ALKPHOS 84 72  --   --   BILITOT 0.5 0.6  --   --   PROT 6.7 6.7  --   --   ALBUMIN 4.2 3.9 3.0* 2.7*    Recent Labs  06/21/17 0349  06/27/17 0445 06/28/17 0510  06/29/17 0431 07/02/17 0651 07/03/17 0257  WBC 13.8*  < > 10.0 9.1 9.2 9.4 10.9*  NEUTROABS 11.1*  --  6.9 6.1  --   --   --   HGB 11.9*  < > 8.1* 8.2* 8.1* 11.0* 10.9*  HCT 37.4  < > 24.9* 26.2* 26.3* 35.1* 35.1*  MCV 94.7  < > 92.6 93.9 95.6 94.9 95.9  PLT 174  < > 172 196 201 256 280  < > = values in this interval not displayed. Lab Results  Component Value Date   TSH 2.03 05/11/2016   Lab Results  Component Value Date   HGBA1C 5.4 06/21/2017   Lab Results  Component Value Date   CHOL 133 06/21/2017   HDL 45 06/21/2017   LDLCALC 64 06/21/2017   TRIG 99 06/26/2017   CHOLHDL 3.0 06/21/2017    Significant Diagnostic Results in last 30 days:  No results found.  Assessment/Plan   Cardiomyopathy, ischemic- 30-35% on admission 10/09/13 --> increased to 40-45% prior to discharge. 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   Dysphagia Doing well on a regular diet and has made progress in this area  Acute ischemic stroke (HCC) - acute/subacute L MCA infarcts and right MCA punctate infarcts, embolic, likely due to PAF not on AC, s/p tPA and mechanical thrombectomy of left MCA with TICI 2b  reperfus No further acute events. She has made improvements as listed in HPI but will most likely need skilled care.   HLD (hyperlipidemia) Continue crestor and monitor LDL (goal would be most likely <70).  It was 70 in Sept 2018.  Constipation Increase senna s 2 tabs BID  Right groin wound Has made significant improvement. Continue daily dressing changes with hydrogel and  monitor.   HTN (hypertension) Controlled. Continue Coreg/lasix.   Family/ staff Communication: discussed with resident/staff  Labs/tests ordered:  TSH

## 2017-08-19 NOTE — Assessment & Plan Note (Signed)
Increase senna s 2 tabs BID

## 2017-08-19 NOTE — Assessment & Plan Note (Signed)
Needs TSH.

## 2017-08-19 NOTE — Assessment & Plan Note (Signed)
Doing well on a regular diet and has made progress in this area

## 2017-08-19 NOTE — Assessment & Plan Note (Signed)
Has made minimal progress in this area unfortunately.

## 2017-08-19 NOTE — Assessment & Plan Note (Signed)
Controlled. Continue Coreg/lasix.

## 2017-08-19 NOTE — Assessment & Plan Note (Signed)
Currently on Eliquis 5 mg BID and Coreg 6.25 mg BID

## 2017-08-19 NOTE — Assessment & Plan Note (Signed)
No further acute events. She has made improvements as listed in HPI but will most likely need skilled care.

## 2017-08-19 NOTE — Assessment & Plan Note (Signed)
Weight trending downward with no symptoms Continue Lasix 10 mg qd  Has anaphylactic allergy to ACE inhibitors

## 2017-08-20 DIAGNOSIS — I69320 Aphasia following cerebral infarction: Secondary | ICD-10-CM | POA: Diagnosis not present

## 2017-08-20 DIAGNOSIS — E039 Hypothyroidism, unspecified: Secondary | ICD-10-CM | POA: Diagnosis not present

## 2017-08-20 DIAGNOSIS — I69851 Hemiplegia and hemiparesis following other cerebrovascular disease affecting right dominant side: Secondary | ICD-10-CM | POA: Diagnosis not present

## 2017-08-20 DIAGNOSIS — I69311 Memory deficit following cerebral infarction: Secondary | ICD-10-CM | POA: Diagnosis not present

## 2017-08-20 DIAGNOSIS — I69391 Dysphagia following cerebral infarction: Secondary | ICD-10-CM | POA: Diagnosis not present

## 2017-08-20 DIAGNOSIS — I69398 Other sequelae of cerebral infarction: Secondary | ICD-10-CM | POA: Diagnosis not present

## 2017-08-20 DIAGNOSIS — I69393 Ataxia following cerebral infarction: Secondary | ICD-10-CM | POA: Diagnosis not present

## 2017-08-20 DIAGNOSIS — E663 Overweight: Secondary | ICD-10-CM | POA: Diagnosis not present

## 2017-08-20 LAB — TSH: TSH: 1.8 (ref <=5.90)

## 2017-08-23 ENCOUNTER — Non-Acute Institutional Stay (SKILLED_NURSING_FACILITY): Payer: Medicare Other | Admitting: Adult Health

## 2017-08-23 ENCOUNTER — Encounter: Payer: Self-pay | Admitting: Adult Health

## 2017-08-23 DIAGNOSIS — I69393 Ataxia following cerebral infarction: Secondary | ICD-10-CM | POA: Diagnosis not present

## 2017-08-23 DIAGNOSIS — F419 Anxiety disorder, unspecified: Secondary | ICD-10-CM

## 2017-08-23 DIAGNOSIS — R6889 Other general symptoms and signs: Secondary | ICD-10-CM | POA: Diagnosis not present

## 2017-08-23 DIAGNOSIS — D649 Anemia, unspecified: Secondary | ICD-10-CM | POA: Diagnosis not present

## 2017-08-23 DIAGNOSIS — I255 Ischemic cardiomyopathy: Secondary | ICD-10-CM | POA: Diagnosis not present

## 2017-08-23 DIAGNOSIS — I480 Paroxysmal atrial fibrillation: Secondary | ICD-10-CM | POA: Diagnosis not present

## 2017-08-23 DIAGNOSIS — I69391 Dysphagia following cerebral infarction: Secondary | ICD-10-CM | POA: Diagnosis not present

## 2017-08-23 DIAGNOSIS — D509 Iron deficiency anemia, unspecified: Secondary | ICD-10-CM | POA: Diagnosis not present

## 2017-08-23 DIAGNOSIS — I69311 Memory deficit following cerebral infarction: Secondary | ICD-10-CM | POA: Diagnosis not present

## 2017-08-23 DIAGNOSIS — I2510 Atherosclerotic heart disease of native coronary artery without angina pectoris: Secondary | ICD-10-CM

## 2017-08-23 DIAGNOSIS — I69320 Aphasia following cerebral infarction: Secondary | ICD-10-CM | POA: Diagnosis not present

## 2017-08-23 DIAGNOSIS — F329 Major depressive disorder, single episode, unspecified: Secondary | ICD-10-CM

## 2017-08-23 DIAGNOSIS — I69851 Hemiplegia and hemiparesis following other cerebrovascular disease affecting right dominant side: Secondary | ICD-10-CM | POA: Diagnosis not present

## 2017-08-23 DIAGNOSIS — R0602 Shortness of breath: Secondary | ICD-10-CM | POA: Diagnosis not present

## 2017-08-23 DIAGNOSIS — Z79899 Other long term (current) drug therapy: Secondary | ICD-10-CM | POA: Diagnosis not present

## 2017-08-23 DIAGNOSIS — I69398 Other sequelae of cerebral infarction: Secondary | ICD-10-CM | POA: Diagnosis not present

## 2017-08-23 DIAGNOSIS — R9431 Abnormal electrocardiogram [ECG] [EKG]: Secondary | ICD-10-CM | POA: Diagnosis not present

## 2017-08-23 DIAGNOSIS — N189 Chronic kidney disease, unspecified: Secondary | ICD-10-CM | POA: Diagnosis not present

## 2017-08-23 DIAGNOSIS — Z9861 Coronary angioplasty status: Secondary | ICD-10-CM

## 2017-08-23 LAB — BASIC METABOLIC PANEL
BUN: 17 (ref 4–21)
Creatinine: 0.6 (ref 0.5–1.1)
Glucose: 98
Potassium: 3.5 (ref 3.4–5.3)
Sodium: 142 (ref 137–147)

## 2017-08-23 LAB — HEPATIC FUNCTION PANEL
ALT: 25 (ref 7–35)
AST: 23 (ref 13–35)
Alkaline Phosphatase: 95 (ref 25–125)
Bilirubin, Total: 0.6

## 2017-08-23 LAB — CBC AND DIFFERENTIAL
HCT: 36 (ref 36–46)
Hemoglobin: 11.5 — AB (ref 12.0–16.0)
Platelets: 284 (ref 150–399)
WBC: 8.6

## 2017-08-23 NOTE — Progress Notes (Signed)
Location:  Occupational psychologist of Service:  SNF (31) Provider:   Cindi Carbon, [place] [phone]   Josetta Huddle, MD  Patient Care Team: Josetta Huddle, MD as PCP - General (Internal Medicine) [facility], Leonie Green, MD as Consulting Physician (Cardiology) Warden Fillers, MD as Consulting Physician (Ophthalmology) Belva Crome, MD as Consulting Physician (Cardiology) Gayland Curry, DO as Consulting Physician (Geriatric Medicine) Elam Dutch, MD as Consulting Physician (Vascular Surgery) Garvin Fila, MD as Consulting Physician (Neurology)  Extended Emergency Contact Information Primary Emergency Contact: Ulice Dash Address: [address]          [place], [place] [address] Johnnette Litter of [place] Phone: [phone] Work Phone: [phone] Mobile Phone: [phone] Relation: Spouse Secondary Emergency Contact: Piloto,Blake Address: [address]          [place], [place] [address] Johnnette Litter of [place] Phone: [phone] Mobile Phone: [phone] Relation: Son  Code Status:  DNR Goals of care: Advanced Directive information Advanced Directives 08/19/2017  Does Patient Have a Medical Advance Directive? Yes  Type of Advance Directive Out of facility DNR (pink MOST or yellow form);[place];Living will  Does patient want to make changes to medical advance directive? -  Copy of [place] in Chart? Yes  Would patient like information on creating a medical advance directive? -  Pre-existing out of facility DNR order (yellow form or pink MOST form) Yellow form placed in chart (order not valid for inpatient use);Pink MOST form placed in chart (order not valid for inpatient use)     Chief Complaint  Patient presents with  . Acute Visit    sob, anxiety    HPI:  Pt is a 83 y.o. female seen today for sob/anxiety. On 11/3 she reported  sob and sats were 88%. Oxygen was applied and the symptoms resolved. Each night for the past 2 nights the staff have reported that she is yelling out "oh God" and making attempts to get out of bed, and she has had irregular breathing periodically.  The staff and her husband felt it may have been anxiety related as she has a long hx of this issue. Her husband also has concerns for CHF. Her weight has remained stable throughout her stay 186 lbs. There is no increase in edema The sob is not associated with exertion. She denies CP. PRN xanax 0.125 mg was ordered by the oncall provider for two days but did not seem to help.   2d echo 06/23/17 EF 45-50%, Grade 1 DD, high ventricular filling pressure, with moderate to severe mitral valve regurg, dilated LA,  Wt Readings from Last 3 Encounters:  08/23/17 186 lb (84.4 kg)  08/19/17 186 lb 12.8 oz (84.7 kg)  08/12/17 190 lb (86.2 kg)  Weights are trending downward due to decreased intake.    Anxiety: changed from zoloft to lexapro on 9/26.   Her husband feels this changed help her ability to work with therapy and her mood. She has dealt with depression for 40+ years. She used xanax at home but it did not really help by his account. She is also on Wellbutrin. The staff reports there are times where she does not sleep well at night and can become agitated and makes attempts to be out of bed. It seemed she was doing better with anxiety until this past weekend. She says yes when asked if she is anxious but she can  not verbalize further.  AFIB: continues with irregular rhythm, on eliquis and coreg. Rate has remained controlled.   Past Medical History:  Diagnosis Date  . Anemia   . CAD (coronary artery disease)    a. STEMI 10/09/13 - 100% LAD - following initial angioplasty, ? Spontaneous dissection vs related to ballon. s/p complex PCI with 2 overlapping DES to LAD. Adm complicated by VDRF, cardiogenic shock, peri-MI PAF with NSR on amiodarone. Residual mod RCA  disease.   Marland Kitchen CAD S/P percutaneous coronary angioplasty 10/09/13   DES x 2 to Mid LAD, 2.5 x 38, 2.5 x 12 Promus (covering extensive disection), moderate RCA disesase  . Cardiogenic shock ([place])    a. 09/2013 - during admission for STEMI.  . Depression   . Dyslipidemia   . H/O Acute respiratory failure due to hypoxia 10/10/2013   a. 09/2013 - secondary to anterior STEMI related pulmonary edema (resolved).  . Hypertension   . Hypothyroidism   . Ischemic cardiomyopathy    a. EF 30-35% during 09/2013 adm for STEMI. b. Repeat Echo EF 40-45% with distal LAD hypokinesis (10/16/14 following STEMI recovery).  . Migraine headache    a. Remotely.  . Orthostatic hypotension    a. Adm 10/2013 for this, felt due to Lasix. Not on regular diuretic due to this/dehydration.  Marland Kitchen PAF (paroxysmal atrial fibrillation) ([place])    a. 09/2013 - peri-MI, NSR at discharge on amiodarone.  . Sinus bradycardia    Past Surgical History:  Procedure Laterality Date  . CORONARY ANGIOPLASTY WITH STENT PLACEMENT  10/09/13   2 overlapping Promus DES to mid LAD, occlusion/dissection (2.5 x 38, 2.5 x 12)  . IR ANGIO INTRA EXTRACRAN SEL COM CAROTID INNOMINATE UNI R MOD SED  06/20/2017  . IR PERCUTANEOUS ART THROMBECTOMY/INFUSION INTRACRANIAL INC DIAG ANGIO  06/20/2017  . IR US GUIDE BX ASP/DRAIN  06/23/2017  . SHOULDER SURGERY      Allergies  Allergen Reactions  . Ace Inhibitors Anaphylaxis  . Beta Adrenergic Blockers Anaphylaxis  . Iohexol Anaphylaxis     Code: HIVES, Desc: anaphylactic shock s/p contrast injection many yrs ago--suggested that pt NEVER have iv contrast//a.c., Onset Date: [id number]   . Atorvastatin Rash  . Latex Rash  . Penicillins Swelling and Rash    Details are unknown  . Sulfa Antibiotics Rash    Outpatient Encounter Medications as of 08/23/2017  Medication Sig  . escitalopram (LEXAPRO) 10 MG tablet Take 10 mg daily by mouth.  Marland Kitchen acetaminophen (TYLENOL) 325 MG tablet Take 650 mg by mouth every 6 (six)  hours as needed for mild pain.  Marland Kitchen apixaban (ELIQUIS) 5 MG TABS tablet Take 1 tablet (5 mg total) by mouth 2 (two) times daily.  Marland Kitchen aspirin 81 MG chewable tablet Chew 1 tablet (81 mg total) by mouth daily.  . Biotin 1000 MCG tablet Take 1,000 mcg by mouth daily.  Marland Kitchen buPROPion (WELLBUTRIN SR) 150 MG 12 hr tablet Take 150 mg by mouth every morning.   . calcium-vitamin D (OSCAL WITH D) 500-200 MG-UNIT per tablet Take 1 tablet by mouth daily.   . carvedilol (COREG) 6.25 MG tablet Take 1 tablet (6.25 mg total) by mouth 2 (two) times daily with a meal.  . cholecalciferol (VITAMIN D) 1000 UNITS tablet Take 1,000 Units by mouth daily.  . clobetasol ointment (TEMOVATE) 3.90 % Apply 1 application topically 2 (two) times daily as needed. (affected areas)--also thighs  . fexofenadine (ALLEGRA) 60 MG tablet Take 60 mg by mouth 2 (two) times  daily as needed for allergies or rhinitis.  . furosemide (LASIX) 20 MG tablet Take 0.5 tablets (10 mg total) by mouth daily.  . montelukast (SINGULAIR) 10 MG tablet Take 10 mg by mouth at bedtime.  . Multiple Vitamin (MULTIVITAMIN WITH MINERALS) TABS tablet Take 1 tablet by mouth daily.  . nitroGLYCERIN (NITROSTAT) 0.4 MG SL tablet Place 0.4 mg under the tongue every 5 (five) minutes as needed for chest pain.  Marland Kitchen omeprazole (PRILOSEC) 20 MG capsule Take 20 mg by mouth daily.   [name] Glycol-Propyl Glycol (SYSTANE OP) Apply 1 drop to eye daily as needed (dry eyes).  . polyethylene glycol (MIRALAX / GLYCOLAX) packet Take 17 g by mouth daily.  . Protein (UNJURY PO) Take 0.5 scoop by mouth 2 (two) times daily.  . rosuvastatin (CRESTOR) 10 MG tablet Take 1 tablet (10 mg total) by mouth 3 (three) times a week. (Mondays, Wednesdays, and Fridays)  . sennosides-docusate sodium (SENOKOT-S) 8.6-50 MG tablet Take 2 tablets by mouth at bedtime.  Marland Kitchen SYNTHROID 100 MCG tablet TAKE 1 TABLET (150mcg) IN THE MORNING ON AN EMPTY STOMACH.  . vitamin E (VITAMIN E) 400 UNIT capsule Take 400  Units by mouth daily.  . [DISCONTINUED] sertraline (ZOLOFT) 100 MG tablet Take 100 mg by mouth daily.    Facility-Administered Encounter Medications as of 08/23/2017  Medication  . etomidate (AMIDATE) injection  . rocuronium (ZEMURON) injection  . succinylcholine (ANECTINE) injection    Review of Systems  Constitutional: Negative for activity change, appetite change, chills, diaphoresis, fatigue, fever and unexpected weight change.  HENT: Negative for congestion.   Respiratory: Positive for cough and shortness of breath. Negative for wheezing and stridor.   Cardiovascular: Negative for chest pain, palpitations and leg swelling.  Gastrointestinal: Negative for abdominal distention, abdominal pain, constipation, diarrhea and nausea.  Genitourinary: Negative for difficulty urinating and dysuria.  Musculoskeletal: Positive for gait problem. Negative for arthralgias, back pain, joint swelling and myalgias.  Neurological: Positive for speech difficulty. Negative for dizziness, tremors, seizures, syncope, facial asymmetry, weakness, light-headedness, numbness and headaches.  Psychiatric/Behavioral: Positive for agitation, confusion, dysphoric mood and sleep disturbance. Negative for behavioral problems, hallucinations and suicidal ideas. The patient is nervous/anxious. The patient is not hyperactive.     Immunization History  Administered Date(s) Administered  . Influenza Split 08/06/2010, 08/05/2011, 07/26/2012  . Influenza,inj,Quad PF,6+ Mos 08/20/2016  . Pneumococcal Conjugate-13 04/04/2014   Pertinent  Health Maintenance Due  Topic Date Due  . DEXA SCAN  10/16/1997  . PNA vac Low Risk Adult (2 of 2 - PPSV23) 04/05/2015  . INFLUENZA VACCINE  05/19/2017   Fall Risk  01/20/2017 09/16/2016 07/29/2016  Falls in the past year? No No No   Functional Status Survey:    Vitals:   08/23/17 1037  BP: 120/70  Pulse: 85  Resp: 16  SpO2: 98%  Weight: 186 lb (84.4 kg)   Body mass index is  30.95 kg/m. Physical Exam  Constitutional: No distress.  HENT:  Head: Normocephalic and atraumatic.  Nose: Nose normal.  Mouth/Throat: Oropharynx is clear and moist. No oropharyngeal exudate.  Eyes: Conjunctivae and EOM are normal. Pupils are equal, round, and reactive to light. Right eye exhibits no discharge. Left eye exhibits no discharge.  Neck: No JVD present.  Cardiovascular: Normal rate.  No murmur heard. Irregular, trace edema to right ankle  Pulmonary/Chest: Breath sounds normal. No respiratory distress. She has no wheezes.  Slight increased WOB  Abdominal: Soft. Bowel sounds are normal. She exhibits no distension.  Musculoskeletal: She exhibits no edema or tenderness.  Neurological: She is alert. No cranial nerve deficit.  Able to follow commands. Confabulates. Oriented to self and place but not time.   Skin: Skin is warm and dry. She is not diaphoretic.  Psychiatric:  Slightly anxious    Labs reviewed: Recent Labs    06/27/17 1725 06/28/17 0510 06/28/17 1745 06/29/17 0431 06/30/17 0413 07/02/17 0651 07/03/17 0257 07/13/17  NA 140 142  --  140 141 141 145 140  K 2.8* 3.5  --  3.5 3.4* 3.0* 3.0* 3.3*  CL 104 107  --  107 103 103 104  --   CO2 28 30  --  29 33* 30 33*  --   GLUCOSE 104* 142*  --  139* 115* 146* 127*  --   BUN 28* 34*  --  33* 20 29* 35* 19  CREATININE 0.55 0.63  --  0.62 0.69 0.82 0.86 0.7  CALCIUM 8.7* 9.1  --  9.3 9.5 10.1 10.5*  --   MG 2.1 1.8  --   --  1.6*  --   --   --   PHOS 1.9* 1.6* 3.5 2.1* 2.8  --   --   --    Recent Labs    01/26/17 0855 06/20/17 0819 06/21/17 0349 06/24/17 0410  AST 23 26  --   --   ALT 20 23  --   --   ALKPHOS 84 72  --   --   BILITOT 0.5 0.6  --   --   PROT 6.7 6.7  --   --   ALBUMIN 4.2 3.9 3.0* 2.7*   Recent Labs    06/21/17 0349  06/27/17 0445 06/28/17 0510 06/29/17 0431 07/02/17 0651 07/03/17 0257 07/13/17  WBC 13.8*   < > 10.0 9.1 9.2 9.4 10.9* 6.6  NEUTROABS 11.1*  --  6.9 6.1  --   --    --   --   HGB 11.9*   < > 8.1* 8.2* 8.1* 11.0* 10.9* 12.2  HCT 37.4   < > 24.9* 26.2* 26.3* 35.1* 35.1* 39  MCV 94.7   < > 92.6 93.9 95.6 94.9 95.9  --   PLT 174   < > 172 196 201 256 280 269   < > = values in this interval not displayed.   Lab Results  Component Value Date   TSH 2.03 05/11/2016   Lab Results  Component Value Date   HGBA1C 5.4 06/21/2017   Lab Results  Component Value Date   CHOL 133 06/21/2017   HDL 45 06/21/2017   LDLCALC 64 06/21/2017   TRIG 99 06/26/2017   CHOLHDL 3.0 06/21/2017    Significant Diagnostic Results in last 30 days:  No results found.  Assessment/Plan  1. Shortness of breath ? If this is due to anxiety as her lungs are clear. The staff do report a dry cough and she is at risk for aspiration so we will check a CXR. She can continue to use oxygen as needed. Also checking CBC CMP If the work up returns normal this may be due to anxiety as it does not occur with exertion. See #2  2. Anxiety and depression Continue lexapro at 10 mg qd Reduce Wellbutrin to 75 mg qd (?stimulatory effect) Referral to Dr. Martina Sinner per POA request  3. Cardiomyopathy, ischemic- 30-35% on admission 10/09/13 --> increased to 40-45% prior to discharge. Weight is trending down with clear lung sounds and no edema  I have asked the staff to monitor her weight qd  Continue the lasix at 10 mg qd unless her CXR shows signs of CHF, which were not apparent on clinical exam  4. Paroxysmal atrial fibrillation (HCC) Rate remains controlled with coreg and eliquis for CVA risk reduction No angina   5. CAD S/P percutaneous coronary angioplasty -- PCI to LAD in setting Ant STEMI; 2 overlapping Promx DES 2.5 x 38, 2.5 x 12 (postdilated to ~3 mm) 12 lead ekg   6. Insomnia Add melatonin 5 mg qhs to see if this helps with her sleep cycle issues.   7. Advanced care planning I had a prolonged discussion with Mr. Foucher regarding her care. He expressed concerns about her having to  move to skilled care as he does not feel she will be happy there. He does not want any life prolonging or aggressive measures but he would like her to be treated for reversible conditions. He expressed that she has been dealing with depression and anxiety for 40 years.  He wonders if some of the SOB is due to this problem. I let him know that we will look into medical causes but we can also consult Dr. Casimiro Needle at his request for psychiatric concerns. He feels that she had a "good outcome".  We both discussed that her quality of life is quite poor at this time but he is hoping that she will continue to work with therapy and show signs of improvement in her mobility so that he can take her home. We discussed that it had become increasingly difficult for her to go out of the facility for appointments and that we would try to avoid transports when possible.  He agreed with this notion. We will continue to focus on her QOL.  I spent 60 min in conversation with Ms Pilgrim, then Mr. Heidt separately, and also with the staff at [facility] regarding her care.       Family/ staff Communication: discussed with resident and her husband Mr. Carlino  Labs/tests ordered:  CBC CMP EKG CXR

## 2017-08-24 ENCOUNTER — Ambulatory Visit (INDEPENDENT_AMBULATORY_CARE_PROVIDER_SITE_OTHER): Payer: Medicare Other | Admitting: Neurology

## 2017-08-24 ENCOUNTER — Encounter: Payer: Self-pay | Admitting: Neurology

## 2017-08-24 VITALS — BP 145/88 | HR 81 | Ht 65.0 in | Wt 186.0 lb

## 2017-08-24 DIAGNOSIS — I69851 Hemiplegia and hemiparesis following other cerebrovascular disease affecting right dominant side: Secondary | ICD-10-CM | POA: Diagnosis not present

## 2017-08-24 DIAGNOSIS — I69398 Other sequelae of cerebral infarction: Secondary | ICD-10-CM | POA: Diagnosis not present

## 2017-08-24 DIAGNOSIS — I255 Ischemic cardiomyopathy: Secondary | ICD-10-CM

## 2017-08-24 DIAGNOSIS — I69311 Memory deficit following cerebral infarction: Secondary | ICD-10-CM | POA: Diagnosis not present

## 2017-08-24 DIAGNOSIS — I63412 Cerebral infarction due to embolism of left middle cerebral artery: Secondary | ICD-10-CM

## 2017-08-24 DIAGNOSIS — I69391 Dysphagia following cerebral infarction: Secondary | ICD-10-CM | POA: Diagnosis not present

## 2017-08-24 DIAGNOSIS — I69320 Aphasia following cerebral infarction: Secondary | ICD-10-CM | POA: Diagnosis not present

## 2017-08-24 DIAGNOSIS — I69393 Ataxia following cerebral infarction: Secondary | ICD-10-CM | POA: Diagnosis not present

## 2017-08-24 NOTE — Progress Notes (Signed)
[facility] [address] [place]. [place] [address] [phone]       OFFICE FOLLOW-UP NOTE  Melinda. Melinda Noble Date of Birth:  [birth year] Medical Record Number:  [id number]   HPI: Melinda Noble is a 83 year Caucasian lady seen today for the first office follow-up visit following hospital admission for stroke in September 2018.  She is accompanied by her husband and daughter who provide most of the history.  I have personally reviewed the electronic medical records as well as imaging films. Melinda Noble a 83 y.o.femalewith a History of afib, not on anti-coagulants who presents with right sided weakness. She was initially plegic on the right, but by the time of arrival to the ED, was having significant improvement. Her husband states that he spoke with her while he was getting gout of bed and she was normal, she then was up getting ready when he heard her fall. LKW: 6:30 am on 06/20/2017. tpa given?: yes Modified Rankin Score: 0.VAN: positive.  CT scan of the head on admission showed no acute abnormality.  Aspect score was 10.  Patient underwent emergent mechanical thrombectomy of left MCA occlusion by Dr. Estanislado Pandy with TICI 2b reperfusion established.  MRI scan of the brain showed acute and subacute left MCA infarcts involving the left insular cortex, left frontal operculum, left posterior temporal and parietal lobe and a small right parietal lobe and centrum semiovale infarcts as well.  Transthoracic echo showed ejection fraction of 45-50%.  Lower extremity venous Dopplers were negative for DVT.  LDL cholesterol 64 mg percent.  Hemoglobin A1c was 5.4.  Patient was admitted to the intensive care unit initially and intubated subsequently she was extubated and blood pressure is tightly controlled.  She was on aspirin and Plavix prior to admission and Plavix was discontinued and she was started on Eliquis for her A. fib and aspirin 81 mg.  She was transferred for rehabilitation to skilled  nursing facility as she is currently staying.  Patient state that physically she is much better has no focal extremity weakness but cognitively she still has some speech and language difficulties.  When she speaks slowly and carefully she is much more complicated but when she gets excited or tries to talk fast she jungles her words and struggles to find right words.  She uses a walker to ambulate but needs close supervision from the physical therapist.  She is making steady progress.  She is also improved her swallowing and is now on a regular diet.  She plans to go home in the next couple of weeks but will clearly need to have help at home.  She has had some medical decline in her breathing and has been started on home oxygen due to fluid in her lungs.  She is tolerating Eliquis well without bruising or bleeding.  She has had no falls or injuries.  Patient has history of sleep apnea but has not never been started on CPAP.    ROS:   14 system review of systems is positive for hearing loss, runny nose, trouble swallowing, cough, shortness of breath, constipation, headache, speech difficulty, agitation, confusion, depression, nervousness, anxiety, walking difficulty, daytime sleepiness, apnea, constipation and all other systems negative  PMH:  Past Medical History:  Diagnosis Date  . Anemia   . CAD (coronary artery disease)    a. STEMI 10/09/13 - 100% LAD - following initial angioplasty, ? Spontaneous dissection vs related to ballon. s/p complex PCI with 2 overlapping DES to  LAD. Adm complicated by VDRF, cardiogenic shock, peri-MI PAF with NSR on amiodarone. Residual mod RCA disease.   Marland Kitchen CAD S/P percutaneous coronary angioplasty 10/09/13   DES x 2 to Mid LAD, 2.5 x 38, 2.5 x 12 Promus (covering extensive disection), moderate RCA disesase  . Cardiogenic shock ([place])    a. 09/2013 - during admission for STEMI.  . Depression   . Dyslipidemia   . H/O Acute respiratory failure due to hypoxia 10/10/2013     a. 09/2013 - secondary to anterior STEMI related pulmonary edema (resolved).  . Hypertension   . Hypothyroidism   . Ischemic cardiomyopathy    a. EF 30-35% during 09/2013 adm for STEMI. b. Repeat Echo EF 40-45% with distal LAD hypokinesis (10/16/14 following STEMI recovery).  . Migraine headache    a. Remotely.  . Orthostatic hypotension    a. Adm 10/2013 for this, felt due to Lasix. Not on regular diuretic due to this/dehydration.  Marland Kitchen PAF (paroxysmal atrial fibrillation) ([place])    a. 09/2013 - peri-MI, NSR at discharge on amiodarone.  . Sinus bradycardia   . Stroke [facility])     Social History:  Social History   Socioeconomic History  . Marital status: Married    Spouse name: Not on file  . Number of children: Not on file  . Years of education: Not on file  . Highest education level: Not on file  Social Needs  . Financial resource strain: Not on file  . Food insecurity - worry: Not on file  . Food insecurity - inability: Not on file  . Transportation needs - medical: Not on file  . Transportation needs - non-medical: Not on file  Occupational History  . Not on file  Tobacco Use  . Smoking status: Former Research scientist (life sciences)  . Smokeless tobacco: Never Used  Substance and Sexual Activity  . Alcohol use: No    Alcohol/week: 0.0 oz  . Drug use: No  . Sexual activity: Not on file  Other Topics Concern  . Not on file  Social History Narrative   Patient is Married, since 1958 [name])   Lives in apartment, Independent Living section at [facility] since 04/2013.   Stopped Smoking many years ago, minimal aalcohol history.   Regular exercise includes swimming 3d/week,   Patient has no Advanced planning documents    Medications:   Current Outpatient Medications on File Prior to Visit  Medication Sig Dispense Refill  . acetaminophen (TYLENOL) 325 MG tablet Take 650 mg by mouth every 6 (six) hours as needed for mild pain.    Marland Kitchen apixaban (ELIQUIS) 5 MG TABS tablet Take 1  tablet (5 mg total) by mouth 2 (two) times daily. 60 tablet 0  . aspirin 81 MG chewable tablet Chew 1 tablet (81 mg total) by mouth daily.    . Biotin 1000 MCG tablet Take 1,000 mcg by mouth daily.    Marland Kitchen buPROPion (WELLBUTRIN SR) 150 MG 12 hr tablet Take 150 mg by mouth every morning.   1  . calcium-vitamin D (OSCAL WITH D) 500-200 MG-UNIT per tablet Take 1 tablet by mouth daily.     . carvedilol (COREG) 3.125 MG tablet   0  . carvedilol (COREG) 6.25 MG tablet Take 1 tablet (6.25 mg total) by mouth 2 (two) times daily with a meal. 60 tablet 5  . cholecalciferol (VITAMIN D) 1000 UNITS tablet Take 1,000 Units by mouth daily.    . clobetasol ointment (TEMOVATE) 2.70 % Apply 1 application topically 2 (  two) times daily as needed. (affected areas)--also thighs 30 g 4  . escitalopram (LEXAPRO) 10 MG tablet Take 10 mg daily by mouth.    . fexofenadine (ALLEGRA) 60 MG tablet Take 60 mg by mouth 2 (two) times daily as needed for allergies or rhinitis.    . furosemide (LASIX) 40 MG tablet   0  . montelukast (SINGULAIR) 10 MG tablet Take 10 mg by mouth at bedtime.    . Multiple Vitamin (MULTIVITAMIN WITH MINERALS) TABS tablet Take 1 tablet by mouth daily.    . nitroGLYCERIN (NITROSTAT) 0.4 MG SL tablet Place 0.4 mg under the tongue every 5 (five) minutes as needed for chest pain.    Marland Kitchen omeprazole (PRILOSEC) 20 MG capsule Take 20 mg by mouth daily.     [name] Glycol-Propyl Glycol (SYSTANE OP) Apply 1 drop to eye daily as needed (dry eyes).    . polyethylene glycol (MIRALAX / GLYCOLAX) packet Take 17 g by mouth daily.    . Protein (UNJURY PO) Take 0.5 scoop by mouth 2 (two) times daily.    . rosuvastatin (CRESTOR) 10 MG tablet Take 1 tablet (10 mg total) by mouth 3 (three) times a week. (Mondays, Wednesdays, and Fridays) 30 tablet 2  . sennosides-docusate sodium (SENOKOT-S) 8.6-50 MG tablet Take 2 tablets by mouth at bedtime.    . sertraline (ZOLOFT) 100 MG tablet   0  . SYNTHROID 100 MCG tablet TAKE 1  TABLET (169mcg) IN THE MORNING ON AN EMPTY STOMACH. 90 tablet 1  . vitamin E (VITAMIN E) 400 UNIT capsule Take 400 Units by mouth daily.     Current Facility-Administered Medications on File Prior to Visit  Medication Dose Route Frequency Provider Last Rate Last Dose  . etomidate (AMIDATE) injection    Anesthesia Intra-op [place] Reek, MD   14 mg at 10/10/13 0105  . rocuronium [facility]) injection    Anesthesia Intra-op F[place]on Reek, MD   50 mg at 10/10/13 0110  . succinylcholine (ANECTINE) injection    Anesthesia Intra-op F[place]on Reek, MD   100 mg at 10/10/13 0105    Allergies:   Allergies  Allergen Reactions  . Ace Inhibitors Anaphylaxis  . Beta Adrenergic Blockers Anaphylaxis  . Iohexol Anaphylaxis     Code: HIVES, Desc: anaphylactic shock s/p contrast injection many yrs ago--suggested that pt NEVER have iv contrast//a.c., Onset Date: [id number]   . Atorvastatin Rash  . Latex Rash  . Penicillins Swelling and Rash    Details are unknown  . Sulfa Antibiotics Rash    Physical Exam General: well developed, well nourished, seated, in no evident distress Head: head normocephalic and atraumatic.  Neck: supple with no carotid or supraclavicular bruits Cardiovascular: regular rate and rhythm, no murmurs Musculoskeletal: no deformity Skin:  no rash/petichiae Vascular:  Normal pulses all extremities Vitals:   08/24/17 1512  BP: (!) 145/88  Pulse: 81   Neurologic Exam Mental Status: Awake and fully alert. Disriented to place and time. Recent and remote memory poor Attention span, concentration and fund of knowledge diminished. Mood and affect appropriate.  Mild expressive and receptive aphasia speech is mostly fluent with occasional word finding difficulties and paraphasic errors.  Can understand simple 1 and a few two-step commands only.  Able to name and repeat with slight difficulty. Cranial Nerves: Fundoscopic exam reveals sharp disc margins. Pupils equal, briskly  reactive to light. Extraocular movements full without nystagmus. Visual fields full to confrontation. Hearing intact. Facial sensation intact. Face, tongue, palate moves normally and symmetrically.  Motor: Normal bulk and tone. Normal strength in all tested extremity muscles. Sensory.: intact to touch ,pinprick .position and vibratory sensation.  Coordination: Rapid alternating movements normal in all extremities. Finger-to-nose and heel-to-shin performed accurately bilaterally. Gait and Station: Foot as patient sitting in a wheelchair and requires assist with a walker which she did not bring Reflexes: 1+ and symmetric. Toes downgoing.   NIHSS  6 Modified Rankin  3   ASSESSMENT: 45 year Caucasian lady with embolic left MCA infarct in September 2018 from atrial fibrillation treated with IV TPA and mechanical embolectomy with aggressive dual mild aphasia and physical deconditioning.  Multiple vascular risk factors of atrial fibrillation, coronary artery disease, hypertension, hyperlipidemia   PLAN: I had a long d/w patient, her husband and daughter about her recent embolic stroke, residual aphasia risk for recurrent stroke/TIAs, personally independently reviewed imaging studies and stroke evaluation results and answered questions.Continue aspirin 81 mg daily and Eliquis (apixaban) daily  for secondary stroke prevention and maintain strict control of hypertension with blood pressure goal below 130/90, diabetes with hemoglobin A1c goal below 6.5% and lipids with LDL cholesterol goal below 70 mg/dL. I also advised the patient to eat a healthy diet with plenty of whole grains, cereals, fruits and vegetables, exercise regularly and maintain ideal body weight .continue ongoing physical occupational and speech therapy.  Patient was counseled to use a walker at all times and ambulate under supervision only.  Followup in the future with me in 6 months or call earlier if necessary. Greater than 50% of time  during this 35 minute visit was spent on counseling,explanation of diagnosis,of embolic stroke, atrial fibrillation planning of further management, discussion with patient and family and coordination of care Antony Contras, MD  [facility] [address] [place], [place] [address]  Phone [phone] Fax [phone] Note: This document was prepared with digital dictation and possible smart phrase technology. Any transcriptional errors that result from this process are unintentional

## 2017-08-24 NOTE — Patient Instructions (Addendum)
I had a long d/w patient, her husband and daughter about her recent embolic stroke, residual aphasia risk for recurrent stroke/TIAs, personally independently reviewed imaging studies and stroke evaluation results and answered questions.Continue aspirin 81 mg daily and Eliquis (apixaban) daily  for secondary stroke prevention and maintain strict control of hypertension with blood pressure goal below 130/90, diabetes with hemoglobin A1c goal below 6.5% and lipids with LDL cholesterol goal below 70 mg/dL. I also advised the patient to eat a healthy diet with plenty of whole grains, cereals, fruits and vegetables, exercise regularly and maintain ideal body weight .continue ongoing physical occupational and speech therapy.  Patient was counseled to use a walker at all times and ambulate under supervision only.  Followup in the future with me in 6 months or call earlier if necessary.  Stroke Prevention Some medical conditions and behaviors are associated with an increased chance of having a stroke. You may prevent a stroke by making healthy choices and managing medical conditions. How can I reduce my risk of having a stroke?  Stay physically active. Get at least 30 minutes of activity on most or all days.  Do not smoke. It may also be helpful to avoid exposure to secondhand smoke.  Limit alcohol use. Moderate alcohol use is considered to be: ? No more than 2 drinks per day for men. ? No more than 1 drink per day for nonpregnant women.  Eat healthy foods. This involves: ? Eating 5 or more servings of fruits and vegetables a day. ? Making dietary changes that address high blood pressure (hypertension), high cholesterol, diabetes, or obesity.  Manage your cholesterol levels. ? Making food choices that are high in fiber and low in saturated fat, trans fat, and cholesterol may control cholesterol levels. ? Take any prescribed medicines to control cholesterol as directed by your health care  provider.  Manage your diabetes. ? Controlling your carbohydrate and sugar intake is recommended to manage diabetes. ? Take any prescribed medicines to control diabetes as directed by your health care provider.  Control your hypertension. ? Making food choices that are low in salt (sodium), saturated fat, trans fat, and cholesterol is recommended to manage hypertension. ? Ask your health care provider if you need treatment to lower your blood pressure. Take any prescribed medicines to control hypertension as directed by your health care provider. ? If you are 34-61 years of age, have your blood pressure checked every 3-5 years. If you are 9 years of age or older, have your blood pressure checked every year.  Maintain a healthy weight. ? Reducing calorie intake and making food choices that are low in sodium, saturated fat, trans fat, and cholesterol are recommended to manage weight.  Stop drug abuse.  Avoid taking birth control pills. ? Talk to your health care provider about the risks of taking birth control pills if you are over 59 years old, smoke, get migraines, or have ever had a blood clot.  Get evaluated for sleep disorders (sleep apnea). ? Talk to your health care provider about getting a sleep evaluation if you snore a lot or have excessive sleepiness.  Take medicines only as directed by your health care provider. ? For some people, aspirin or blood thinners (anticoagulants) are helpful in reducing the risk of forming abnormal blood clots that can lead to stroke. If you have the irregular heart rhythm of atrial fibrillation, you should be on a blood thinner unless there is a good reason you cannot take them. ? Understand  all your medicine instructions.  Make sure that other conditions (such as anemia or atherosclerosis) are addressed. Get help right away if:  You have sudden weakness or numbness of the face, arm, or leg, especially on one side of the body.  Your face or eyelid  droops to one side.  You have sudden confusion.  You have trouble speaking (aphasia) or understanding.  You have sudden trouble seeing in one or both eyes.  You have sudden trouble walking.  You have dizziness.  You have a loss of balance or coordination.  You have a sudden, severe headache with no known cause.  You have new chest pain or an irregular heartbeat. Any of these symptoms may represent a serious problem that is an emergency. Do not wait to see if the symptoms will go away. Get medical help at once. Call your local emergency services (911 in [place] Do not drive yourself to the hospital. This information is not intended to replace advice given to you by your health care provider. Make sure you discuss any questions you have with your health care provider. Document Released: 11/12/2004 Document Revised: 03/12/2016 Document Reviewed: 04/07/2013 Elsevier Interactive Patient Education  2017 [employer].

## 2017-08-25 DIAGNOSIS — I69393 Ataxia following cerebral infarction: Secondary | ICD-10-CM | POA: Diagnosis not present

## 2017-08-25 DIAGNOSIS — I69311 Memory deficit following cerebral infarction: Secondary | ICD-10-CM | POA: Diagnosis not present

## 2017-08-25 DIAGNOSIS — I69851 Hemiplegia and hemiparesis following other cerebrovascular disease affecting right dominant side: Secondary | ICD-10-CM | POA: Diagnosis not present

## 2017-08-25 DIAGNOSIS — I69391 Dysphagia following cerebral infarction: Secondary | ICD-10-CM | POA: Diagnosis not present

## 2017-08-25 DIAGNOSIS — I69398 Other sequelae of cerebral infarction: Secondary | ICD-10-CM | POA: Diagnosis not present

## 2017-08-25 DIAGNOSIS — I69320 Aphasia following cerebral infarction: Secondary | ICD-10-CM | POA: Diagnosis not present

## 2017-08-26 DIAGNOSIS — I69398 Other sequelae of cerebral infarction: Secondary | ICD-10-CM | POA: Diagnosis not present

## 2017-08-26 DIAGNOSIS — I69320 Aphasia following cerebral infarction: Secondary | ICD-10-CM | POA: Diagnosis not present

## 2017-08-26 DIAGNOSIS — I69311 Memory deficit following cerebral infarction: Secondary | ICD-10-CM | POA: Diagnosis not present

## 2017-08-26 DIAGNOSIS — I69391 Dysphagia following cerebral infarction: Secondary | ICD-10-CM | POA: Diagnosis not present

## 2017-08-26 DIAGNOSIS — I69393 Ataxia following cerebral infarction: Secondary | ICD-10-CM | POA: Diagnosis not present

## 2017-08-26 DIAGNOSIS — I69851 Hemiplegia and hemiparesis following other cerebrovascular disease affecting right dominant side: Secondary | ICD-10-CM | POA: Diagnosis not present

## 2017-08-27 DIAGNOSIS — I69391 Dysphagia following cerebral infarction: Secondary | ICD-10-CM | POA: Diagnosis not present

## 2017-08-27 DIAGNOSIS — I69851 Hemiplegia and hemiparesis following other cerebrovascular disease affecting right dominant side: Secondary | ICD-10-CM | POA: Diagnosis not present

## 2017-08-27 DIAGNOSIS — I69398 Other sequelae of cerebral infarction: Secondary | ICD-10-CM | POA: Diagnosis not present

## 2017-08-27 DIAGNOSIS — I69320 Aphasia following cerebral infarction: Secondary | ICD-10-CM | POA: Diagnosis not present

## 2017-08-27 DIAGNOSIS — I69311 Memory deficit following cerebral infarction: Secondary | ICD-10-CM | POA: Diagnosis not present

## 2017-08-27 DIAGNOSIS — I69393 Ataxia following cerebral infarction: Secondary | ICD-10-CM | POA: Diagnosis not present

## 2017-08-30 DIAGNOSIS — I69393 Ataxia following cerebral infarction: Secondary | ICD-10-CM | POA: Diagnosis not present

## 2017-08-30 DIAGNOSIS — I69851 Hemiplegia and hemiparesis following other cerebrovascular disease affecting right dominant side: Secondary | ICD-10-CM | POA: Diagnosis not present

## 2017-08-30 DIAGNOSIS — I69391 Dysphagia following cerebral infarction: Secondary | ICD-10-CM | POA: Diagnosis not present

## 2017-08-30 DIAGNOSIS — I69398 Other sequelae of cerebral infarction: Secondary | ICD-10-CM | POA: Diagnosis not present

## 2017-08-30 DIAGNOSIS — I69311 Memory deficit following cerebral infarction: Secondary | ICD-10-CM | POA: Diagnosis not present

## 2017-08-30 DIAGNOSIS — I69320 Aphasia following cerebral infarction: Secondary | ICD-10-CM | POA: Diagnosis not present

## 2017-08-31 DIAGNOSIS — I69851 Hemiplegia and hemiparesis following other cerebrovascular disease affecting right dominant side: Secondary | ICD-10-CM | POA: Diagnosis not present

## 2017-08-31 DIAGNOSIS — I69311 Memory deficit following cerebral infarction: Secondary | ICD-10-CM | POA: Diagnosis not present

## 2017-08-31 DIAGNOSIS — I69391 Dysphagia following cerebral infarction: Secondary | ICD-10-CM | POA: Diagnosis not present

## 2017-08-31 DIAGNOSIS — I69398 Other sequelae of cerebral infarction: Secondary | ICD-10-CM | POA: Diagnosis not present

## 2017-08-31 DIAGNOSIS — I69393 Ataxia following cerebral infarction: Secondary | ICD-10-CM | POA: Diagnosis not present

## 2017-08-31 DIAGNOSIS — I69320 Aphasia following cerebral infarction: Secondary | ICD-10-CM | POA: Diagnosis not present

## 2017-09-01 DIAGNOSIS — I69398 Other sequelae of cerebral infarction: Secondary | ICD-10-CM | POA: Diagnosis not present

## 2017-09-01 DIAGNOSIS — I69320 Aphasia following cerebral infarction: Secondary | ICD-10-CM | POA: Diagnosis not present

## 2017-09-01 DIAGNOSIS — I69851 Hemiplegia and hemiparesis following other cerebrovascular disease affecting right dominant side: Secondary | ICD-10-CM | POA: Diagnosis not present

## 2017-09-01 DIAGNOSIS — I69393 Ataxia following cerebral infarction: Secondary | ICD-10-CM | POA: Diagnosis not present

## 2017-09-01 DIAGNOSIS — I69391 Dysphagia following cerebral infarction: Secondary | ICD-10-CM | POA: Diagnosis not present

## 2017-09-01 DIAGNOSIS — I69311 Memory deficit following cerebral infarction: Secondary | ICD-10-CM | POA: Diagnosis not present

## 2017-09-02 DIAGNOSIS — I69851 Hemiplegia and hemiparesis following other cerebrovascular disease affecting right dominant side: Secondary | ICD-10-CM | POA: Diagnosis not present

## 2017-09-02 DIAGNOSIS — I69320 Aphasia following cerebral infarction: Secondary | ICD-10-CM | POA: Diagnosis not present

## 2017-09-02 DIAGNOSIS — I69311 Memory deficit following cerebral infarction: Secondary | ICD-10-CM | POA: Diagnosis not present

## 2017-09-02 DIAGNOSIS — I69391 Dysphagia following cerebral infarction: Secondary | ICD-10-CM | POA: Diagnosis not present

## 2017-09-02 DIAGNOSIS — I69393 Ataxia following cerebral infarction: Secondary | ICD-10-CM | POA: Diagnosis not present

## 2017-09-02 DIAGNOSIS — I69398 Other sequelae of cerebral infarction: Secondary | ICD-10-CM | POA: Diagnosis not present

## 2017-09-03 DIAGNOSIS — I69851 Hemiplegia and hemiparesis following other cerebrovascular disease affecting right dominant side: Secondary | ICD-10-CM | POA: Diagnosis not present

## 2017-09-03 DIAGNOSIS — I69398 Other sequelae of cerebral infarction: Secondary | ICD-10-CM | POA: Diagnosis not present

## 2017-09-03 DIAGNOSIS — I69311 Memory deficit following cerebral infarction: Secondary | ICD-10-CM | POA: Diagnosis not present

## 2017-09-03 DIAGNOSIS — I69320 Aphasia following cerebral infarction: Secondary | ICD-10-CM | POA: Diagnosis not present

## 2017-09-03 DIAGNOSIS — I69393 Ataxia following cerebral infarction: Secondary | ICD-10-CM | POA: Diagnosis not present

## 2017-09-03 DIAGNOSIS — I69391 Dysphagia following cerebral infarction: Secondary | ICD-10-CM | POA: Diagnosis not present

## 2017-09-06 ENCOUNTER — Ambulatory Visit (HOSPITAL_COMMUNITY)
Admission: RE | Admit: 2017-09-06 | Discharge: 2017-09-06 | Disposition: A | Discharge status: Home / Self Care | Payer: Medicare Other | Source: Ambulatory Visit | Attending: Interventional Radiology | Admitting: Interventional Radiology

## 2017-09-06 DIAGNOSIS — I69311 Memory deficit following cerebral infarction: Secondary | ICD-10-CM | POA: Diagnosis not present

## 2017-09-06 DIAGNOSIS — I63512 Cerebral infarction due to unspecified occlusion or stenosis of left middle cerebral artery: Secondary | ICD-10-CM | POA: Diagnosis not present

## 2017-09-06 DIAGNOSIS — I69851 Hemiplegia and hemiparesis following other cerebrovascular disease affecting right dominant side: Secondary | ICD-10-CM | POA: Diagnosis not present

## 2017-09-06 DIAGNOSIS — I69320 Aphasia following cerebral infarction: Secondary | ICD-10-CM | POA: Diagnosis not present

## 2017-09-06 DIAGNOSIS — I639 Cerebral infarction, unspecified: Secondary | ICD-10-CM

## 2017-09-06 DIAGNOSIS — I69391 Dysphagia following cerebral infarction: Secondary | ICD-10-CM | POA: Diagnosis not present

## 2017-09-06 DIAGNOSIS — I69398 Other sequelae of cerebral infarction: Secondary | ICD-10-CM | POA: Diagnosis not present

## 2017-09-06 DIAGNOSIS — I69393 Ataxia following cerebral infarction: Secondary | ICD-10-CM | POA: Diagnosis not present

## 2017-09-06 HISTORY — PX: IR RADIOLOGIST EVAL & MGMT: IMG5224

## 2017-09-07 ENCOUNTER — Non-Acute Institutional Stay (SKILLED_NURSING_FACILITY): Payer: Medicare Other | Admitting: Internal Medicine

## 2017-09-07 ENCOUNTER — Encounter: Payer: Self-pay | Admitting: Internal Medicine

## 2017-09-07 DIAGNOSIS — I69851 Hemiplegia and hemiparesis following other cerebrovascular disease affecting right dominant side: Secondary | ICD-10-CM | POA: Diagnosis not present

## 2017-09-07 DIAGNOSIS — L080 Pyoderma: Secondary | ICD-10-CM

## 2017-09-07 DIAGNOSIS — I69393 Ataxia following cerebral infarction: Secondary | ICD-10-CM | POA: Diagnosis not present

## 2017-09-07 DIAGNOSIS — I69320 Aphasia following cerebral infarction: Secondary | ICD-10-CM | POA: Diagnosis not present

## 2017-09-07 DIAGNOSIS — I69311 Memory deficit following cerebral infarction: Secondary | ICD-10-CM | POA: Diagnosis not present

## 2017-09-07 DIAGNOSIS — I69391 Dysphagia following cerebral infarction: Secondary | ICD-10-CM | POA: Diagnosis not present

## 2017-09-07 DIAGNOSIS — I69398 Other sequelae of cerebral infarction: Secondary | ICD-10-CM | POA: Diagnosis not present

## 2017-09-07 NOTE — Progress Notes (Deleted)
Patient ID: Melinda Noble, female   DOB: 07-Jan-1930, 83 y.o.   MRN: [id number]  Location:  [facility] Room Number: 155 rehab Place of Service:  SNF (31) Provider:  Harlo Fabela L. Mariea Clonts, D.O., C.M.D.  Josetta Huddle, MD  Patient Care Team: Josetta Huddle, MD as PCP - General (Internal Medicine) [facility], [facility], Leonie Green, MD as Consulting Physician (Cardiology) Warden Fillers, MD as Consulting Physician (Ophthalmology) Belva Crome, MD as Consulting Physician (Cardiology) Gayland Curry, DO as Consulting Physician (Geriatric Medicine) Elam Dutch, MD as Consulting Physician (Vascular Surgery) Garvin Fila, MD as Consulting Physician (Neurology)  Extended Emergency Contact Information Primary Emergency Contact: Ulice Dash Address: [address]          [place], [place] [address] Johnnette Litter of [place] Phone: [phone] Work Phone: [phone] Mobile Phone: [phone] Relation: Spouse Secondary Emergency Contact: Ok Edwards Address: [address]          [place], [place] [address] Johnnette Litter of [place] Phone: [phone] Relation: Daughter  Code Status:  DNR Goals of care: Advanced Directive information Advanced Directives 09/07/2017  Does Patient Have a Medical Advance Directive? Yes  Type of Advance Directive Out of facility DNR (pink MOST or yellow form);Healthcare Power of Attorney  Does patient want to make changes to medical advance directive? No - Patient declined  Copy of [place] in Chart? Yes  Would patient like information on creating a medical advance directive? -  Pre-existing out of facility DNR order (yellow form or pink MOST form) Pink MOST form placed in chart (order not valid for inpatient use)     Chief Complaint  Patient presents with  . Acute Visit    rash on back of leg, shingles?    HPI:  Pt is a 83 y.o. female seen today for an acute  visit for    Past Medical History:  Diagnosis Date  . Anemia   . CAD (coronary artery disease)    a. STEMI 10/09/13 - 100% LAD - following initial angioplasty, ? Spontaneous dissection vs related to ballon. s/p complex PCI with 2 overlapping DES to LAD. Adm complicated by VDRF, cardiogenic shock, peri-MI PAF with NSR on amiodarone. Residual mod RCA disease.   Marland Kitchen CAD S/P percutaneous coronary angioplasty 10/09/13   DES x 2 to Mid LAD, 2.5 x 38, 2.5 x 12 Promus (covering extensive disection), moderate RCA disesase  . Cardiogenic shock ([place])    a. 09/2013 - during admission for STEMI.  . Depression   . Dyslipidemia   . H/O Acute respiratory failure due to hypoxia 10/10/2013   a. 09/2013 - secondary to anterior STEMI related pulmonary edema (resolved).  . Hypertension   . Hypothyroidism   . Ischemic cardiomyopathy    a. EF 30-35% during 09/2013 adm for STEMI. b. Repeat Echo EF 40-45% with distal LAD hypokinesis (10/16/14 following STEMI recovery).  . Migraine headache    a. Remotely.  . Orthostatic hypotension    a. Adm 10/2013 for this, felt due to Lasix. Not on regular diuretic due to this/dehydration.  Marland Kitchen PAF (paroxysmal atrial fibrillation) ([place])    a. 09/2013 - peri-MI, NSR at discharge on amiodarone.  . Sinus bradycardia   . Stroke [facility])    Past Surgical History:  Procedure Laterality Date  . CORONARY ANGIOPLASTY WITH STENT PLACEMENT  10/09/13   2 overlapping Promus DES to mid LAD, occlusion/dissection (2.5 x 38, 2.5 x 12)  . IR ANGIO  INTRA EXTRACRAN SEL COM CAROTID INNOMINATE UNI R MOD SED  06/20/2017  . IR PERCUTANEOUS ART THROMBECTOMY/INFUSION INTRACRANIAL INC DIAG ANGIO  06/20/2017  . IR RADIOLOGIST EVAL & MGMT  09/06/2017  . IR US GUIDE BX ASP/DRAIN  06/23/2017  . LEFT HEART CATHETERIZATION WITH CORONARY ANGIOGRAM N/A 10/09/2013   Performed by Leonie Man, MD at [facility] CATH LAB  . RADIOLOGY WITH ANESTHESIA N/A 06/20/2017   Performed by Luanne Bras, MD at [facility]  . repair  right femoral pseudoanerysm; evacuation hemotoma Right 06/23/2017   Performed by Elam Dutch, MD at [facility]      Allergies  Allergen Reactions  . Ace Inhibitors Anaphylaxis  . Beta Adrenergic Blockers Anaphylaxis  . Iohexol Anaphylaxis     Code: HIVES, Desc: anaphylactic shock s/p contrast injection many yrs ago--suggested that pt NEVER have iv contrast//a.c., Onset Date: [id number]   . Atorvastatin Rash  . Latex Rash  . Penicillins Swelling and Rash    Details are unknown  . Sulfa Antibiotics Rash    Outpatient Encounter Medications as of 09/07/2017  Medication Sig  . acetaminophen (TYLENOL) 325 MG tablet Take 650 mg by mouth every 6 (six) hours as needed for mild pain.  Marland Kitchen apixaban (ELIQUIS) 5 MG TABS tablet Take 1 tablet (5 mg total) by mouth 2 (two) times daily.  Marland Kitchen aspirin 81 MG chewable tablet Chew 1 tablet (81 mg total) by mouth daily.  . bisacodyl (DULCOLAX) 10 MG suppository Place 10 mg rectally as needed for moderate constipation.  Marland Kitchen buPROPion (WELLBUTRIN SR) 150 MG 12 hr tablet Take 75 mg by mouth every morning.   . calcium-vitamin D (OSCAL WITH D) 500-200 MG-UNIT per tablet Take 1 tablet by mouth daily.   . carvedilol (COREG) 6.25 MG tablet Take 1 tablet (6.25 mg total) by mouth 2 (two) times daily with a meal.  . cholecalciferol (VITAMIN D) 1000 UNITS tablet Take 1,000 Units by mouth daily.  . clobetasol ointment (TEMOVATE) 1.32 % Apply 1 application topically 2 (two) times daily as needed. (affected areas)--also thighs  . escitalopram (LEXAPRO) 10 MG tablet Take 10 mg daily by mouth.  . fexofenadine (ALLEGRA) 60 MG tablet Take 60 mg by mouth 2 (two) times daily as needed for allergies or rhinitis.  . furosemide (LASIX) 20 MG tablet Take 10 mg by mouth daily.  . magnesium hydroxide (MILK OF MAGNESIA) 400 MG/5ML suspension Take 45 mLs by mouth daily as needed for mild constipation.  . Melatonin 5 MG TABS Take 1 tablet by mouth at bedtime as needed.  .  montelukast (SINGULAIR) 10 MG tablet Take 10 mg by mouth at bedtime.  . Multiple Vitamin (MULTIVITAMIN WITH MINERALS) TABS tablet Take 1 tablet by mouth daily.  . nitroGLYCERIN (NITROSTAT) 0.4 MG SL tablet Place 0.4 mg under the tongue every 5 (five) minutes as needed for chest pain.  Marland Kitchen omeprazole (PRILOSEC) 20 MG capsule Take 20 mg by mouth daily.   [name] Glycol-Propyl Glycol (SYSTANE OP) Apply 1 drop to eye daily as needed (dry eyes).  . polyethylene glycol (MIRALAX / GLYCOLAX) packet Take 17 g by mouth daily.  . Protein (UNJURY PO) Take 0.5 scoop by mouth 2 (two) times daily.  . QUEtiapine (SEROQUEL) 25 MG tablet Take 12.5 mg by mouth 2 (two) times daily as needed.  . rosuvastatin (CRESTOR) 10 MG tablet Take 1 tablet (10 mg total) by mouth 3 (three) times a week. (Mondays, Wednesdays, and Fridays)  . sennosides-docusate sodium (  SENOKOT-S) 8.6-50 MG tablet Take 2 tablets by mouth at bedtime.  Marland Kitchen SYNTHROID 100 MCG tablet TAKE 1 TABLET (150mcg) IN THE MORNING ON AN EMPTY STOMACH.  . [DISCONTINUED] Biotin 1000 MCG tablet Take 1,000 mcg by mouth daily.  . [DISCONTINUED] carvedilol (COREG) 3.125 MG tablet   . [DISCONTINUED] furosemide (LASIX) 40 MG tablet   . [DISCONTINUED] sertraline (ZOLOFT) 100 MG tablet   . [DISCONTINUED] vitamin E (VITAMIN E) 400 UNIT capsule Take 400 Units by mouth daily.   Facility-Administered Encounter Medications as of 09/07/2017  Medication  . etomidate (AMIDATE) injection  . rocuronium (ZEMURON) injection  . succinylcholine (ANECTINE) injection    Review of Systems  Immunization History  Administered Date(s) Administered  . Influenza Split 08/06/2010, 08/05/2011, 07/26/2012  . Influenza,inj,Quad PF,6+ Mos 08/20/2016  . Pneumococcal Conjugate-13 04/04/2014   Pertinent  Health Maintenance Due  Topic Date Due  . DEXA SCAN  10/16/1997  . PNA vac Low Risk Adult (2 of 2 - PPSV23) 04/05/2015  . INFLUENZA VACCINE  05/19/2017   Fall Risk  01/20/2017  09/16/2016 07/29/2016  Falls in the past year? No No No   Functional Status Survey:    Vitals:   09/07/17 1125  BP: (!) 130/92  Pulse: 78  Resp: (!) 22  Temp: (!) 97.4 F (36.3 C)  TempSrc: Oral  SpO2: 93%  Weight: 187 lb (84.8 kg)   Body mass index is 31.12 kg/m. Physical Exam  Labs reviewed: Recent Labs    06/27/17 1725 06/28/17 0510 06/28/17 1745 06/29/17 0431 06/30/17 0413 07/02/17 0651 07/03/17 0257 07/13/17 08/23/17 1128  NA 140 142  --  140 141 141 145 140 142  K 2.8* 3.5  --  3.5 3.4* 3.0* 3.0* 3.3* 3.5  CL 104 107  --  107 103 103 104  --   --   CO2 28 30  --  29 33* 30 33*  --   --   GLUCOSE 104* 142*  --  139* 115* 146* 127*  --   --   BUN 28* 34*  --  33* 20 29* 35* 19 17  CREATININE 0.55 0.63  --  0.62 0.69 0.82 0.86 0.7 0.6  CALCIUM 8.7* 9.1  --  9.3 9.5 10.1 10.5*  --   --   MG 2.1 1.8  --   --  1.6*  --   --   --   --   PHOS 1.9* 1.6* 3.5 2.1* 2.8  --   --   --   --    Recent Labs    01/26/17 0855 06/20/17 0819 06/21/17 0349 06/24/17 0410 08/23/17 1128  AST 23 26  --   --  23  ALT 20 23  --   --  25  ALKPHOS 84 72  --   --  95  BILITOT 0.5 0.6  --   --   --   PROT 6.7 6.7  --   --   --   ALBUMIN 4.2 3.9 3.0* 2.7*  --    Recent Labs    06/21/17 0349  06/27/17 0445 06/28/17 0510 06/29/17 0431 07/02/17 0651 07/03/17 0257 07/13/17  WBC 13.8*   < > 10.0 9.1 9.2 9.4 10.9* 6.6  NEUTROABS 11.1*  --  6.9 6.1  --   --   --   --   HGB 11.9*   < > 8.1* 8.2* 8.1* 11.0* 10.9* 12.2  HCT 37.4   < > 24.9* 26.2* 26.3* 35.1* 35.1* 39  MCV 94.7   < > 92.6 93.9 95.6 94.9 95.9  --   PLT 174   < > 172 196 201 256 280 269   < > = values in this interval not displayed.   Lab Results  Component Value Date   TSH 2.03 05/11/2016   Lab Results  Component Value Date   HGBA1C 5.4 06/21/2017   Lab Results  Component Value Date   CHOL 133 06/21/2017   HDL 45 06/21/2017   LDLCALC 64 06/21/2017   TRIG 99 06/26/2017   CHOLHDL 3.0 06/21/2017     Significant Diagnostic Results in last 30 days:  Ir Radiologist Eval & Mgmt  Result Date: 09/07/2017 EXAM: ESTABLISHED PATIENT OFFICE VISIT CHIEF COMPLAINT: Speech difficulty. Current Pain Level: 1-10 HISTORY OF PRESENT ILLNESS: The patient is an 83 year old right-handed lady who is status post endovascular revascularization of symptomatic left middle cerebral artery occlusion with mechanical thrombectomy on 06/20/2017. The patient underwent complete revascularization. Subsequently the patient was discharged to rehab where she is now. The patient is accompanied by her husband. According him the patient has made significant progress in terms of motor and physical function. However, the patient continues to have significant difficulties in terms of speech problems. This is mostly in terms of comprehension and also with expression. This too, however, has been improved to some degree according to her husband. The patient at the present time is at [facility]. She is able to feed herself, and sit up in a chair and also stand up under her own power. She walks with a walker in the presence of aid. She is able to feed herself. She is unable to clean herself or bathe herself. She is able to stand up from a chair. The patient herself denies any visual problems in terms of blindness or blurred vision or diplopia. Her appetite is reasonable.  Her weight is steady. She, otherwise, grossly denies symptoms related to cardiovascular, respiratory, GI or GU systems. The patient does have issues with depression which she had prior to the event also. Recently her medications in this regard were switched. Present medications: Tylenol. Eliquis. Aspirin 81 mg a day. Biotin. Wellbutrin. Calcium vitamin-D. Coreg. Cholecalciferol tablets. Lexapro. Allegra. Furosemide. Singulair. Multi vitamins. Nitrostat as needed sublingually. Omeprazole. Polyethyl glycol-Propyl eyedrops. Crestor. Senokot. Zoloft has been stopped. Allergies: Ace  inhibitors causes anaphylaxis. Beta Adrenergic blockers causes anaphylaxis. Iodinated contrast causes anaphylaxis. Atorvastatin causes rash. Latex causes rash. Penicillins cause swelling and rash. Sulfa antibiotics cause rash. Review of systems as above. PHYSICAL EXAMINATION: Appears in no acute distress. Patient has difficulty with hearing in both her ears. She follows visual cues appropriately. However, she has difficulty following commands by voice. The patient is able to lift both arms equally above her head. She is able to lift both her lower extremities also to 3+ over 5 while sitting in a chair. ASSESSMENT AND PLAN: The patient's post treatment arteriograms were reviewed with her husband. A complete reperfusion of the occluded left middle cerebral artery was achieved with mechanical thrombectomy. It was stressed to the patient's husband to continue with daily physical therapy, occupational therapy and speech therapy exercises. Questions were answered to his satisfaction. The patient will be seen on a p.r.n. basis. She does have a follow-up appointment with her neurologist. The husband leaves with good understanding and agreement with the above management plan. Electronically Signed   By: Luanne Bras M.D.   On: 09/06/2017 18:31    Assessment/Plan There are no diagnoses linked to this encounter.  Family/ staff Communication: ***  Labs/tests ordered:  ***

## 2017-09-07 NOTE — Progress Notes (Signed)
Location:  [facility] Room Number: 155 rehab Place of Service:  SNF (31) Provider:  Rheanna Sergent L. Mariea Clonts, D.O., C.M.D.  Josetta Huddle, MD  Patient Care Team: Josetta Huddle, MD as PCP - General (Internal Medicine) [facility], Leonie Green, MD as Consulting Physician (Cardiology) Warden Fillers, MD as Consulting Physician (Ophthalmology) Belva Crome, MD as Consulting Physician (Cardiology) Gayland Curry, DO as Consulting Physician (Geriatric Medicine) Elam Dutch, MD as Consulting Physician (Vascular Surgery) Garvin Fila, MD as Consulting Physician (Neurology)  Extended Emergency Contact Information Primary Emergency Contact: Ulice Dash Address: [address]          [place], [place] [address] Johnnette Litter of [place] Phone: [phone] Work Phone: [phone] Mobile Phone: [phone] Relation: Spouse Secondary Emergency Contact: Ok Edwards Address: [address]          [place], [place] [address] Johnnette Litter of [place] Phone: [phone] Relation: Daughter  Code Status:  DNR Goals of care: Advanced Directive information Advanced Directives 09/07/2017  Does Patient Have a Medical Advance Directive? Yes  Type of Advance Directive Out of facility DNR (pink MOST or yellow form);Healthcare Power of Attorney  Does patient want to make changes to medical advance directive? No - Patient declined  Copy of [place] in Chart? Yes  Would patient like information on creating a medical advance directive? -  Pre-existing out of facility DNR order (yellow form or pink MOST form) Pink MOST form placed in chart (order not valid for inpatient use)     Chief Complaint  Patient presents with  . Acute Visit    rash on back of leg, shingles?    HPI:  Pt is a 83 y.o. female here in rehab s/p severe stroke seen today for an acute visit for a rash on the back of her right thigh  just noticed this am during care.  It looks like a cluster of vesicles and nursing is concerned she may have shingles. Pt reports no pain and no itching at the site.     Past Medical History:  Diagnosis Date  . Anemia   . CAD (coronary artery disease)    a. STEMI 10/09/13 - 100% LAD - following initial angioplasty, ? Spontaneous dissection vs related to ballon. s/p complex PCI with 2 overlapping DES to LAD. Adm complicated by VDRF, cardiogenic shock, peri-MI PAF with NSR on amiodarone. Residual mod RCA disease.   Marland Kitchen CAD S/P percutaneous coronary angioplasty 10/09/13   DES x 2 to Mid LAD, 2.5 x 38, 2.5 x 12 Promus (covering extensive disection), moderate RCA disesase  . Cardiogenic shock ([place])    a. 09/2013 - during admission for STEMI.  . Depression   . Dyslipidemia   . H/O Acute respiratory failure due to hypoxia 10/10/2013   a. 09/2013 - secondary to anterior STEMI related pulmonary edema (resolved).  . Hypertension   . Hypothyroidism   . Ischemic cardiomyopathy    a. EF 30-35% during 09/2013 adm for STEMI. b. Repeat Echo EF 40-45% with distal LAD hypokinesis (10/16/14 following STEMI recovery).  . Migraine headache    a. Remotely.  . Orthostatic hypotension    a. Adm 10/2013 for this, felt due to Lasix. Not on regular diuretic due to this/dehydration.  Marland Kitchen PAF (paroxysmal atrial fibrillation) ([place])    a. 09/2013 - peri-MI, NSR at discharge on amiodarone.  . Sinus bradycardia   . Stroke [facility])    Past Surgical History:  Procedure  Laterality Date  . CORONARY ANGIOPLASTY WITH STENT PLACEMENT  10/09/13   2 overlapping Promus DES to mid LAD, occlusion/dissection (2.5 x 38, 2.5 x 12)  . FEMORAL ARTERY EXPLORATION Right 06/23/2017   Procedure: repair right femoral pseudoanerysm; evacuation hemotoma;  Surgeon: Elam Dutch, MD;  Location: [facility] OR;  Service: Vascular;  Laterality: Right;  . IR ANGIO INTRA EXTRACRAN SEL COM CAROTID INNOMINATE UNI R MOD SED  06/20/2017  . IR PERCUTANEOUS ART  THROMBECTOMY/INFUSION INTRACRANIAL INC DIAG ANGIO  06/20/2017  . IR RADIOLOGIST EVAL & MGMT  09/06/2017  . IR US GUIDE BX ASP/DRAIN  06/23/2017  . LEFT HEART CATHETERIZATION WITH CORONARY ANGIOGRAM N/A 10/09/2013   Procedure: LEFT HEART CATHETERIZATION WITH CORONARY ANGIOGRAM;  Surgeon: Leonie Man, MD;  Location: [facility] CATH LAB;  Service: Cardiovascular;  Laterality: N/A;  . RADIOLOGY WITH ANESTHESIA N/A 06/20/2017   Procedure: RADIOLOGY WITH ANESTHESIA;  Surgeon: Luanne Bras, MD;  Location: [facility];  Service: Radiology;  Laterality: N/A;  . SHOULDER SURGERY      Allergies  Allergen Reactions  . Ace Inhibitors Anaphylaxis  . Beta Adrenergic Blockers Anaphylaxis  . Iohexol Anaphylaxis     Code: HIVES, Desc: anaphylactic shock s/p contrast injection many yrs ago--suggested that pt NEVER have iv contrast//a.c., Onset Date: [id number]   . Atorvastatin Rash  . Latex Rash  . Penicillins Swelling and Rash    Details are unknown  . Sulfa Antibiotics Rash    Outpatient Encounter Medications as of 09/07/2017  Medication Sig  . acetaminophen (TYLENOL) 325 MG tablet Take 650 mg by mouth every 6 (six) hours as needed for mild pain.  Marland Kitchen apixaban (ELIQUIS) 5 MG TABS tablet Take 1 tablet (5 mg total) by mouth 2 (two) times daily.  Marland Kitchen aspirin 81 MG chewable tablet Chew 1 tablet (81 mg total) by mouth daily.  . bisacodyl (DULCOLAX) 10 MG suppository Place 10 mg rectally as needed for moderate constipation.  Marland Kitchen buPROPion (WELLBUTRIN SR) 150 MG 12 hr tablet Take 75 mg by mouth every morning.   . calcium-vitamin D (OSCAL WITH D) 500-200 MG-UNIT per tablet Take 1 tablet by mouth daily.   . carvedilol (COREG) 6.25 MG tablet Take 1 tablet (6.25 mg total) by mouth 2 (two) times daily with a meal.  . cholecalciferol (VITAMIN D) 1000 UNITS tablet Take 1,000 Units by mouth daily.  . clobetasol ointment (TEMOVATE) 2.69 % Apply 1 application topically 2 (two) times daily as needed. (affected areas)--also thighs  .  escitalopram (LEXAPRO) 10 MG tablet Take 10 mg daily by mouth.  . fexofenadine (ALLEGRA) 60 MG tablet Take 60 mg by mouth 2 (two) times daily as needed for allergies or rhinitis.  . furosemide (LASIX) 20 MG tablet Take 10 mg by mouth daily.  . magnesium hydroxide (MILK OF MAGNESIA) 400 MG/5ML suspension Take 45 mLs by mouth daily as needed for mild constipation.  . Melatonin 5 MG TABS Take 1 tablet by mouth at bedtime as needed.  . montelukast (SINGULAIR) 10 MG tablet Take 10 mg by mouth at bedtime.  . Multiple Vitamin (MULTIVITAMIN WITH MINERALS) TABS tablet Take 1 tablet by mouth daily.  . nitroGLYCERIN (NITROSTAT) 0.4 MG SL tablet Place 0.4 mg under the tongue every 5 (five) minutes as needed for chest pain.  Marland Kitchen omeprazole (PRILOSEC) 20 MG capsule Take 20 mg by mouth daily.   [name] Glycol-Propyl Glycol (SYSTANE OP) Apply 1 drop to eye daily as needed (dry eyes).  . polyethylene glycol (MIRALAX / GLYCOLAX)  packet Take 17 g by mouth daily.  . Protein (UNJURY PO) Take 0.5 scoop by mouth 2 (two) times daily.  . QUEtiapine (SEROQUEL) 25 MG tablet Take 12.5 mg by mouth 2 (two) times daily as needed.  . rosuvastatin (CRESTOR) 10 MG tablet Take 1 tablet (10 mg total) by mouth 3 (three) times a week. (Mondays, Wednesdays, and Fridays)  . sennosides-docusate sodium (SENOKOT-S) 8.6-50 MG tablet Take 2 tablets by mouth at bedtime.  Marland Kitchen SYNTHROID 100 MCG tablet TAKE 1 TABLET (142mcg) IN THE MORNING ON AN EMPTY STOMACH.  . [DISCONTINUED] Biotin 1000 MCG tablet Take 1,000 mcg by mouth daily.  . [DISCONTINUED] carvedilol (COREG) 3.125 MG tablet   . [DISCONTINUED] furosemide (LASIX) 40 MG tablet   . [DISCONTINUED] sertraline (ZOLOFT) 100 MG tablet   . [DISCONTINUED] vitamin E (VITAMIN E) 400 UNIT capsule Take 400 Units by mouth daily.   Facility-Administered Encounter Medications as of 09/07/2017  Medication  . etomidate (AMIDATE) injection  . rocuronium (ZEMURON) injection  . succinylcholine  (ANECTINE) injection    Review of Systems  Constitutional: Negative for chills and fever.  Skin: Positive for rash. Negative for itching.       No pain  Neurological: Positive for speech change and weakness. Negative for tingling and sensory change.  Psychiatric/Behavioral: Positive for memory loss.    Immunization History  Administered Date(s) Administered  . Influenza Split 08/06/2010, 08/05/2011, 07/26/2012  . Influenza,inj,Quad PF,6+ Mos 08/20/2016  . Pneumococcal Conjugate-13 04/04/2014   Pertinent  Health Maintenance Due  Topic Date Due  . DEXA SCAN  10/16/1997  . PNA vac Low Risk Adult (2 of 2 - PPSV23) 04/05/2015  . INFLUENZA VACCINE  05/19/2017   Fall Risk  01/20/2017 09/16/2016 07/29/2016  Falls in the past year? No No No   Functional Status Survey:  dependent in adls except able to feed herself, needed help to stand and pull down pants for exam  Vitals:   09/07/17 1125  BP: (!) 130/92  Pulse: 78  Resp: (!) 22  Temp: (!) 97.4 F (36.3 C)  TempSrc: Oral  SpO2: 93%  Weight: 187 lb (84.8 kg)   Body mass index is 31.12 kg/m. Physical Exam  Constitutional: She appears well-developed and well-nourished. No distress.  Neurological: She is alert.  Severe aphasia  Skin: Skin is warm and dry. Rash noted.  cluster of brown scabbed-over pustules on right posterior thigh, no evidence of excoriation, no active drainage, no tenderness when touched with a gloved hand    Labs reviewed: Recent Labs    06/27/17 1725 06/28/17 0510 06/28/17 1745 06/29/17 0431 06/30/17 0413 07/02/17 0651 07/03/17 0257 07/13/17 08/23/17 1128  NA 140 142  --  140 141 141 145 140 142  K 2.8* 3.5  --  3.5 3.4* 3.0* 3.0* 3.3* 3.5  CL 104 107  --  107 103 103 104  --   --   CO2 28 30  --  29 33* 30 33*  --   --   GLUCOSE 104* 142*  --  139* 115* 146* 127*  --   --   BUN 28* 34*  --  33* 20 29* 35* 19 17  CREATININE 0.55 0.63  --  0.62 0.69 0.82 0.86 0.7 0.6  CALCIUM 8.7* 9.1  --  9.3  9.5 10.1 10.5*  --   --   MG 2.1 1.8  --   --  1.6*  --   --   --   --   PHOS  1.9* 1.6* 3.5 2.1* 2.8  --   --   --   --    Recent Labs    01/26/17 0855 06/20/17 0819 06/21/17 0349 06/24/17 0410 08/23/17 1128  AST 23 26  --   --  23  ALT 20 23  --   --  25  ALKPHOS 84 72  --   --  95  BILITOT 0.5 0.6  --   --   --   PROT 6.7 6.7  --   --   --   ALBUMIN 4.2 3.9 3.0* 2.7*  --    Recent Labs    06/21/17 0349  06/27/17 0445 06/28/17 0510 06/29/17 0431 07/02/17 0651 07/03/17 0257 07/13/17 08/23/17 1128  WBC 13.8*   < > 10.0 9.1 9.2 9.4 10.9* 6.6 8.6  NEUTROABS 11.1*  --  6.9 6.1  --   --   --   --   --   HGB 11.9*   < > 8.1* 8.2* 8.1* 11.0* 10.9* 12.2 11.5*  HCT 37.4   < > 24.9* 26.2* 26.3* 35.1* 35.1* 39 36  MCV 94.7   < > 92.6 93.9 95.6 94.9 95.9  --   --   PLT 174   < > 172 196 201 256 280 269 284   < > = values in this interval not displayed.   Lab Results  Component Value Date   TSH 2.03 05/11/2016   Lab Results  Component Value Date   HGBA1C 5.4 06/21/2017   Lab Results  Component Value Date   CHOL 133 06/21/2017   HDL 45 06/21/2017   LDLCALC 64 06/21/2017   TRIG 99 06/26/2017   CHOLHDL 3.0 06/21/2017    Significant Diagnostic Results in last 30 days:  Ir Radiologist Eval & Mgmt  Result Date: 09/07/2017 EXAM: ESTABLISHED PATIENT OFFICE VISIT CHIEF COMPLAINT: Speech difficulty. Current Pain Level: 1-10 HISTORY OF PRESENT ILLNESS: The patient is an 83 year old right-handed lady who is status post endovascular revascularization of symptomatic left middle cerebral artery occlusion with mechanical thrombectomy on 06/20/2017. The patient underwent complete revascularization. Subsequently the patient was discharged to rehab where she is now. The patient is accompanied by her husband. According him the patient has made significant progress in terms of motor and physical function. However, the patient continues to have significant difficulties in terms of speech problems.  This is mostly in terms of comprehension and also with expression. This too, however, has been improved to some degree according to her husband. The patient at the present time is at [facility]. She is able to feed herself, and sit up in a chair and also stand up under her own power. She walks with a walker in the presence of aid. She is able to feed herself. She is unable to clean herself or bathe herself. She is able to stand up from a chair. The patient herself denies any visual problems in terms of blindness or blurred vision or diplopia. Her appetite is reasonable.  Her weight is steady. She, otherwise, grossly denies symptoms related to cardiovascular, respiratory, GI or GU systems. The patient does have issues with depression which she had prior to the event also. Recently her medications in this regard were switched. Present medications: Tylenol. Eliquis. Aspirin 81 mg a day. Biotin. Wellbutrin. Calcium vitamin-D. Coreg. Cholecalciferol tablets. Lexapro. Allegra. Furosemide. Singulair. Multi vitamins. Nitrostat as needed sublingually. Omeprazole. Polyethyl glycol-Propyl eyedrops. Crestor. Senokot. Zoloft has been stopped. Allergies: Ace inhibitors causes anaphylaxis. Beta Adrenergic blockers causes anaphylaxis.  Iodinated contrast causes anaphylaxis. Atorvastatin causes rash. Latex causes rash. Penicillins cause swelling and rash. Sulfa antibiotics cause rash. Review of systems as above. PHYSICAL EXAMINATION: Appears in no acute distress. Patient has difficulty with hearing in both her ears. She follows visual cues appropriately. However, she has difficulty following commands by voice. The patient is able to lift both arms equally above her head. She is able to lift both her lower extremities also to 3+ over 5 while sitting in a chair. ASSESSMENT AND PLAN: The patient's post treatment arteriograms were reviewed with her husband. A complete reperfusion of the occluded left middle cerebral artery was achieved  with mechanical thrombectomy. It was stressed to the patient's husband to continue with daily physical therapy, occupational therapy and speech therapy exercises. Questions were answered to his satisfaction. The patient will be seen on a p.r.n. basis. She does have a follow-up appointment with her neurologist. The husband leaves with good understanding and agreement with the above management plan. Electronically Signed   By: Luanne Bras M.D.   On: 09/06/2017 18:31    Assessment/Plan 1. Pustular rash -may have been shingles, but now dried up and does not given patient discomfort or itching, no drainage so no intervention needed -should she develop pain, would use salonpas with lidocaine to area or lidoderm or lyrica  Family/ staff Communication: discussed with rehab nurse and nurse manager who helped me look at it  Labs/tests ordered:  No new  Rochell Puett L. Journey Ratterman, D.O. [facility] [address], [place] [address] Cell Phone (Mon-Fri 8am-5pm):  [phone] On Call:  [phone] & follow prompts after 5pm & weekends Office Phone:  [phone] Office Fax:  [phone]

## 2017-09-08 DIAGNOSIS — I69851 Hemiplegia and hemiparesis following other cerebrovascular disease affecting right dominant side: Secondary | ICD-10-CM | POA: Diagnosis not present

## 2017-09-08 DIAGNOSIS — I69311 Memory deficit following cerebral infarction: Secondary | ICD-10-CM | POA: Diagnosis not present

## 2017-09-08 DIAGNOSIS — I69398 Other sequelae of cerebral infarction: Secondary | ICD-10-CM | POA: Diagnosis not present

## 2017-09-08 DIAGNOSIS — I69320 Aphasia following cerebral infarction: Secondary | ICD-10-CM | POA: Diagnosis not present

## 2017-09-08 DIAGNOSIS — I69393 Ataxia following cerebral infarction: Secondary | ICD-10-CM | POA: Diagnosis not present

## 2017-09-08 DIAGNOSIS — I69391 Dysphagia following cerebral infarction: Secondary | ICD-10-CM | POA: Diagnosis not present

## 2017-09-10 DIAGNOSIS — I69311 Memory deficit following cerebral infarction: Secondary | ICD-10-CM | POA: Diagnosis not present

## 2017-09-10 DIAGNOSIS — I69320 Aphasia following cerebral infarction: Secondary | ICD-10-CM | POA: Diagnosis not present

## 2017-09-10 DIAGNOSIS — I69393 Ataxia following cerebral infarction: Secondary | ICD-10-CM | POA: Diagnosis not present

## 2017-09-10 DIAGNOSIS — I69398 Other sequelae of cerebral infarction: Secondary | ICD-10-CM | POA: Diagnosis not present

## 2017-09-10 DIAGNOSIS — I69391 Dysphagia following cerebral infarction: Secondary | ICD-10-CM | POA: Diagnosis not present

## 2017-09-10 DIAGNOSIS — I69851 Hemiplegia and hemiparesis following other cerebrovascular disease affecting right dominant side: Secondary | ICD-10-CM | POA: Diagnosis not present

## 2017-09-13 DIAGNOSIS — I69398 Other sequelae of cerebral infarction: Secondary | ICD-10-CM | POA: Diagnosis not present

## 2017-09-13 DIAGNOSIS — I69391 Dysphagia following cerebral infarction: Secondary | ICD-10-CM | POA: Diagnosis not present

## 2017-09-13 DIAGNOSIS — I69311 Memory deficit following cerebral infarction: Secondary | ICD-10-CM | POA: Diagnosis not present

## 2017-09-13 DIAGNOSIS — I69393 Ataxia following cerebral infarction: Secondary | ICD-10-CM | POA: Diagnosis not present

## 2017-09-13 DIAGNOSIS — I69851 Hemiplegia and hemiparesis following other cerebrovascular disease affecting right dominant side: Secondary | ICD-10-CM | POA: Diagnosis not present

## 2017-09-13 DIAGNOSIS — I69320 Aphasia following cerebral infarction: Secondary | ICD-10-CM | POA: Diagnosis not present

## 2017-09-14 ENCOUNTER — Non-Acute Institutional Stay (SKILLED_NURSING_FACILITY): Payer: Medicare Other | Admitting: Internal Medicine

## 2017-09-14 DIAGNOSIS — R231 Pallor: Secondary | ICD-10-CM | POA: Diagnosis not present

## 2017-09-14 DIAGNOSIS — R0603 Acute respiratory distress: Secondary | ICD-10-CM

## 2017-09-14 DIAGNOSIS — R471 Dysarthria and anarthria: Secondary | ICD-10-CM | POA: Diagnosis not present

## 2017-09-14 DIAGNOSIS — G8100 Flaccid hemiplegia affecting unspecified side: Secondary | ICD-10-CM | POA: Diagnosis not present

## 2017-09-14 DIAGNOSIS — I69320 Aphasia following cerebral infarction: Secondary | ICD-10-CM | POA: Diagnosis not present

## 2017-09-14 DIAGNOSIS — I69391 Dysphagia following cerebral infarction: Secondary | ICD-10-CM | POA: Diagnosis not present

## 2017-09-14 DIAGNOSIS — I639 Cerebral infarction, unspecified: Secondary | ICD-10-CM

## 2017-09-14 DIAGNOSIS — I69398 Other sequelae of cerebral infarction: Secondary | ICD-10-CM | POA: Diagnosis not present

## 2017-09-14 DIAGNOSIS — I69851 Hemiplegia and hemiparesis following other cerebrovascular disease affecting right dominant side: Secondary | ICD-10-CM | POA: Diagnosis not present

## 2017-09-14 DIAGNOSIS — I69311 Memory deficit following cerebral infarction: Secondary | ICD-10-CM | POA: Diagnosis not present

## 2017-09-14 DIAGNOSIS — I69393 Ataxia following cerebral infarction: Secondary | ICD-10-CM | POA: Diagnosis not present

## 2017-09-14 DIAGNOSIS — R4701 Aphasia: Secondary | ICD-10-CM | POA: Diagnosis not present

## 2017-09-15 ENCOUNTER — Encounter: Payer: Self-pay | Admitting: Internal Medicine

## 2017-09-15 ENCOUNTER — Telehealth: Payer: Self-pay | Admitting: Neurology

## 2017-09-15 NOTE — Progress Notes (Signed)
Location:  [facility] Room Number: Rehab Place of Service:  SNF (31) Provider:  Karinne Schmader L. Mariea Clonts, D.O., C.M.D.  Josetta Huddle, MD  Patient Care Team: Josetta Huddle, MD as PCP - General (Internal Medicine) [facility], Leonie Green, MD as Consulting Physician (Cardiology) Warden Fillers, MD as Consulting Physician (Ophthalmology) Belva Crome, MD as Consulting Physician (Cardiology) Gayland Curry, DO as Consulting Physician (Geriatric Medicine) Elam Dutch, MD as Consulting Physician (Vascular Surgery) Garvin Fila, MD as Consulting Physician (Neurology)  Extended Emergency Contact Information Primary Emergency Contact: Ulice Dash Address: [address]          [place], [place] [address] Johnnette Litter of [place] Phone: [phone] Work Phone: [phone] Mobile Phone: [phone] Relation: Spouse Secondary Emergency Contact: Ok Edwards Address: [address]          [place], [place] [address] Johnnette Litter of [place] Phone: [phone] Relation: Daughter  Code Status:  DNR Goals of care: Advanced Directive information Advanced Directives 09/07/2017  Does Patient Have a Medical Advance Directive? Yes  Type of Advance Directive Out of facility DNR (pink MOST or yellow form);Healthcare Power of Attorney  Does patient want to make changes to medical advance directive? No - Patient declined  Copy of [place] in Chart? Yes  Would patient like information on creating a medical advance directive? -  Pre-existing out of facility DNR order (yellow form or pink MOST form) Pink MOST form placed in chart (order not valid for inpatient use)     Chief Complaint  Patient presents with  . Acute Visit    garbled speech, pallor, cold feet, weakness, increased confusion, ? new stroke    HPI:  Pt is a 83 y.o. female  With h/o massive stroke with hemiplegia, dysphagia,  dysarthria, expressive and receptive aphasia, severe depression, dementia and anxiety seen today for an acute visit for garbled speech, increased pallor, cold legs and feet, abdominal breathing with periods of tachypnea--? New stroke.    Past Medical History:  Diagnosis Date  . Anemia   . CAD (coronary artery disease)    a. STEMI 10/09/13 - 100% LAD - following initial angioplasty, ? Spontaneous dissection vs related to ballon. s/p complex PCI with 2 overlapping DES to LAD. Adm complicated by VDRF, cardiogenic shock, peri-MI PAF with NSR on amiodarone. Residual mod RCA disease.   Marland Kitchen CAD S/P percutaneous coronary angioplasty 10/09/13   DES x 2 to Mid LAD, 2.5 x 38, 2.5 x 12 Promus (covering extensive disection), moderate RCA disesase  . Cardiogenic shock ([place])    a. 09/2013 - during admission for STEMI.  . Depression   . Dyslipidemia   . H/O Acute respiratory failure due to hypoxia 10/10/2013   a. 09/2013 - secondary to anterior STEMI related pulmonary edema (resolved).  . Hypertension   . Hypothyroidism   . Ischemic cardiomyopathy    a. EF 30-35% during 09/2013 adm for STEMI. b. Repeat Echo EF 40-45% with distal LAD hypokinesis (10/16/14 following STEMI recovery).  . Migraine headache    a. Remotely.  . Orthostatic hypotension    a. Adm 10/2013 for this, felt due to Lasix. Not on regular diuretic due to this/dehydration.  Marland Kitchen PAF (paroxysmal atrial fibrillation) ([place])    a. 09/2013 - peri-MI, NSR at discharge on amiodarone.  . Sinus bradycardia   . Stroke [facility])    Past Surgical History:  Procedure Laterality Date  . CORONARY ANGIOPLASTY WITH STENT PLACEMENT  10/09/13   2 overlapping Promus DES to mid LAD, occlusion/dissection (2.5 x 38, 2.5 x 12)  . FEMORAL ARTERY EXPLORATION Right 06/23/2017   Procedure: repair right femoral pseudoanerysm; evacuation hemotoma;  Surgeon: Elam Dutch, MD;  Location: [facility] OR;  Service: Vascular;  Laterality: Right;  . IR ANGIO INTRA EXTRACRAN SEL COM  CAROTID INNOMINATE UNI R MOD SED  06/20/2017  . IR PERCUTANEOUS ART THROMBECTOMY/INFUSION INTRACRANIAL INC DIAG ANGIO  06/20/2017  . IR RADIOLOGIST EVAL & MGMT  09/06/2017  . IR US GUIDE BX ASP/DRAIN  06/23/2017  . LEFT HEART CATHETERIZATION WITH CORONARY ANGIOGRAM N/A 10/09/2013   Procedure: LEFT HEART CATHETERIZATION WITH CORONARY ANGIOGRAM;  Surgeon: Leonie Man, MD;  Location: [facility] CATH LAB;  Service: Cardiovascular;  Laterality: N/A;  . RADIOLOGY WITH ANESTHESIA N/A 06/20/2017   Procedure: RADIOLOGY WITH ANESTHESIA;  Surgeon: Luanne Bras, MD;  Location: [facility];  Service: Radiology;  Laterality: N/A;  . SHOULDER SURGERY      Allergies  Allergen Reactions  . Ace Inhibitors Anaphylaxis  . Beta Adrenergic Blockers Anaphylaxis  . Iohexol Anaphylaxis     Code: HIVES, Desc: anaphylactic shock s/p contrast injection many yrs ago--suggested that pt NEVER have iv contrast//a.c., Onset Date: [id number]   . Atorvastatin Rash  . Latex Rash  . Penicillins Swelling and Rash    Details are unknown  . Sulfa Antibiotics Rash    Outpatient Encounter Medications as of 09/14/2017  Medication Sig  . acetaminophen (TYLENOL) 325 MG tablet Take 650 mg by mouth every 6 (six) hours as needed for mild pain.  Marland Kitchen apixaban (ELIQUIS) 5 MG TABS tablet Take 1 tablet (5 mg total) by mouth 2 (two) times daily.  Marland Kitchen aspirin 81 MG chewable tablet Chew 1 tablet (81 mg total) by mouth daily.  . bisacodyl (DULCOLAX) 10 MG suppository Place 10 mg rectally as needed for moderate constipation.  Marland Kitchen buPROPion (WELLBUTRIN SR) 150 MG 12 hr tablet Take 75 mg by mouth every morning.   . calcium-vitamin D (OSCAL WITH D) 500-200 MG-UNIT per tablet Take 1 tablet by mouth daily.   . carvedilol (COREG) 6.25 MG tablet Take 1 tablet (6.25 mg total) by mouth 2 (two) times daily with a meal.  . cholecalciferol (VITAMIN D) 1000 UNITS tablet Take 1,000 Units by mouth daily.  . clobetasol ointment (TEMOVATE) 5.46 % Apply 1 application  topically 2 (two) times daily as needed. (affected areas)--also thighs  . escitalopram (LEXAPRO) 10 MG tablet Take 10 mg daily by mouth.  . fexofenadine (ALLEGRA) 60 MG tablet Take 60 mg by mouth 2 (two) times daily as needed for allergies or rhinitis.  . furosemide (LASIX) 20 MG tablet Take 10 mg by mouth daily.  . magnesium hydroxide (MILK OF MAGNESIA) 400 MG/5ML suspension Take 45 mLs by mouth daily as needed for mild constipation.  . Melatonin 5 MG TABS Take 1 tablet by mouth at bedtime as needed.  . montelukast (SINGULAIR) 10 MG tablet Take 10 mg by mouth at bedtime.  . Multiple Vitamin (MULTIVITAMIN WITH MINERALS) TABS tablet Take 1 tablet by mouth daily.  . nitroGLYCERIN (NITROSTAT) 0.4 MG SL tablet Place 0.4 mg under the tongue every 5 (five) minutes as needed for chest pain.  Marland Kitchen omeprazole (PRILOSEC) 20 MG capsule Take 20 mg by mouth daily.   [name] Glycol-Propyl Glycol (SYSTANE OP) Apply 1 drop to eye daily as needed (dry eyes).  . polyethylene glycol (MIRALAX / GLYCOLAX) packet Take 17 g by mouth daily.  . Protein (  UNJURY PO) Take 0.5 scoop by mouth 2 (two) times daily.  . QUEtiapine (SEROQUEL) 25 MG tablet Take 12.5 mg by mouth 2 (two) times daily as needed.  . rosuvastatin (CRESTOR) 10 MG tablet Take 1 tablet (10 mg total) by mouth 3 (three) times a week. (Mondays, Wednesdays, and Fridays)  . sennosides-docusate sodium (SENOKOT-S) 8.6-50 MG tablet Take 2 tablets by mouth at bedtime.  Marland Kitchen SYNTHROID 100 MCG tablet TAKE 1 TABLET (121mcg) IN THE MORNING ON AN EMPTY STOMACH.   Facility-Administered Encounter Medications as of 09/14/2017  Medication  . etomidate (AMIDATE) injection  . rocuronium (ZEMURON) injection  . succinylcholine (ANECTINE) injection    Review of Systems  Unable to perform ROS: Patient unresponsive (ROS obtained from her husband and rehab RNs)  Constitutional: Negative for chills and fever.  HENT: Positive for hearing loss.   Respiratory:       Rapid  abdominal breathing while sleeping was noted  Cardiovascular: Negative for chest pain and leg swelling.  Gastrointestinal: Negative for abdominal pain.  Genitourinary: Negative for dysuria.  Musculoskeletal: Negative for falls.  Neurological: Positive for tremors and weakness.       Increasingly garbled speech, decreased responsiveness, pallor, cool extremities  Psychiatric/Behavioral: Positive for memory loss.    Immunization History  Administered Date(s) Administered  . Influenza Split 08/06/2010, 08/05/2011, 07/26/2012  . Influenza,inj,Quad PF,6+ Mos 08/20/2016  . Pneumococcal Conjugate-13 04/04/2014   Pertinent  Health Maintenance Due  Topic Date Due  . DEXA SCAN  10/16/1997  . PNA vac Low Risk Adult (2 of 2 - PPSV23) 04/05/2015  . INFLUENZA VACCINE  05/19/2017   Fall Risk  01/20/2017 09/16/2016 07/29/2016  Falls in the past year? No No No   Functional Status Survey:  DEPENDENT in ADLs except to feed herself, but needs foods prepped and set up  Vitals:   09/14/17 1900  BP: (!) 180/110  Pulse: 98  Resp: (!) 26  Temp: 97.7 F (36.5 C)  SpO2: 93%  Weight: 187 lb 3.2 oz (84.9 kg)   Body mass index is 31.15 kg/m. Physical Exam  Constitutional:  Lethargic, would nod and groan, but would not open her eyes for me or her husband or the nurse, pale and slightly jaundice-appearing  Eyes: Pupils are equal, round, and reactive to light.  Could not follow commands for EOM eval  Cardiovascular:  irreg irreg  Pulmonary/Chest: Effort normal and breath sounds normal. No respiratory distress. She has no wheezes. She has no rales.  Upper airway increased secretions; abdominal breathing in Cheyne-Stokes pattern  Abdominal: Soft. Bowel sounds are normal. She exhibits no distension. There is no tenderness.  Musculoskeletal:  Could not follow commands to assess  Neurological:  Only shaking head side to side, not otherwise responding, unable to grasp my hands or follow any commands  whatsoever; at baseline had severe stroke with receptive and expressive aphasia and some dysarthria, but now not responding to [place]  Skin: There is pallor.  Feet and legs cool to touch  Psychiatric:  Unable to assess at this time    Labs reviewed: Recent Labs    06/27/17 1725 06/28/17 0510 06/28/17 1745 06/29/17 0431 06/30/17 0413 07/02/17 0651 07/03/17 0257 07/13/17 08/23/17 1128  NA 140 142  --  140 141 141 145 140 142  K 2.8* 3.5  --  3.5 3.4* 3.0* 3.0* 3.3* 3.5  CL 104 107  --  107 103 103 104  --   --   CO2 28 30  --  29  33* 30 33*  --   --   GLUCOSE 104* 142*  --  139* 115* 146* 127*  --   --   BUN 28* 34*  --  33* 20 29* 35* 19 17  CREATININE 0.55 0.63  --  0.62 0.69 0.82 0.86 0.7 0.6  CALCIUM 8.7* 9.1  --  9.3 9.5 10.1 10.5*  --   --   MG 2.1 1.8  --   --  1.6*  --   --   --   --   PHOS 1.9* 1.6* 3.5 2.1* 2.8  --   --   --   --    Recent Labs    01/26/17 0855 06/20/17 0819 06/21/17 0349 06/24/17 0410 08/23/17 1128  AST 23 26  --   --  23  ALT 20 23  --   --  25  ALKPHOS 84 72  --   --  95  BILITOT 0.5 0.6  --   --   --   PROT 6.7 6.7  --   --   --   ALBUMIN 4.2 3.9 3.0* 2.7*  --    Recent Labs    06/21/17 0349  06/27/17 0445 06/28/17 0510 06/29/17 0431 07/02/17 0651 07/03/17 0257 07/13/17 08/23/17 1128  WBC 13.8*   < > 10.0 9.1 9.2 9.4 10.9* 6.6 8.6  NEUTROABS 11.1*  --  6.9 6.1  --   --   --   --   --   HGB 11.9*   < > 8.1* 8.2* 8.1* 11.0* 10.9* 12.2 11.5*  HCT 37.4   < > 24.9* 26.2* 26.3* 35.1* 35.1* 39 36  MCV 94.7   < > 92.6 93.9 95.6 94.9 95.9  --   --   PLT 174   < > 172 196 201 256 280 269 284   < > = values in this interval not displayed.   Lab Results  Component Value Date   TSH 2.03 05/11/2016   Lab Results  Component Value Date   HGBA1C 5.4 06/21/2017   Lab Results  Component Value Date   CHOL 133 06/21/2017   HDL 45 06/21/2017   LDLCALC 64 06/21/2017   TRIG 99 06/26/2017   CHOLHDL 3.0 06/21/2017    Significant Diagnostic  Results in last 30 days:  Ir Radiologist Eval & Mgmt  Result Date: 09/07/2017 EXAM: ESTABLISHED PATIENT OFFICE VISIT CHIEF COMPLAINT: Speech difficulty. Current Pain Level: 1-10 HISTORY OF PRESENT ILLNESS: The patient is an 84 year old right-handed lady who is status post endovascular revascularization of symptomatic left middle cerebral artery occlusion with mechanical thrombectomy on 06/20/2017. The patient underwent complete revascularization. Subsequently the patient was discharged to rehab where she is now. The patient is accompanied by her husband. According him the patient has made significant progress in terms of motor and physical function. However, the patient continues to have significant difficulties in terms of speech problems. This is mostly in terms of comprehension and also with expression. This too, however, has been improved to some degree according to her husband. The patient at the present time is at [facility]. She is able to feed herself, and sit up in a chair and also stand up under her own power. She walks with a walker in the presence of aid. She is able to feed herself. She is unable to clean herself or bathe herself. She is able to stand up from a chair. The patient herself denies any visual problems in terms of blindness or blurred  vision or diplopia. Her appetite is reasonable.  Her weight is steady. She, otherwise, grossly denies symptoms related to cardiovascular, respiratory, GI or GU systems. The patient does have issues with depression which she had prior to the event also. Recently her medications in this regard were switched. Present medications: Tylenol. Eliquis. Aspirin 81 mg a day. Biotin. Wellbutrin. Calcium vitamin-D. Coreg. Cholecalciferol tablets. Lexapro. Allegra. Furosemide. Singulair. Multi vitamins. Nitrostat as needed sublingually. Omeprazole. Polyethyl glycol-Propyl eyedrops. Crestor. Senokot. Zoloft has been stopped. Allergies: Ace inhibitors causes anaphylaxis.  Beta Adrenergic blockers causes anaphylaxis. Iodinated contrast causes anaphylaxis. Atorvastatin causes rash. Latex causes rash. Penicillins cause swelling and rash. Sulfa antibiotics cause rash. Review of systems as above. PHYSICAL EXAMINATION: Appears in no acute distress. Patient has difficulty with hearing in both her ears. She follows visual cues appropriately. However, she has difficulty following commands by voice. The patient is able to lift both arms equally above her head. She is able to lift both her lower extremities also to 3+ over 5 while sitting in a chair. ASSESSMENT AND PLAN: The patient's post treatment arteriograms were reviewed with her husband. A complete reperfusion of the occluded left middle cerebral artery was achieved with mechanical thrombectomy. It was stressed to the patient's husband to continue with daily physical therapy, occupational therapy and speech therapy exercises. Questions were answered to his satisfaction. The patient will be seen on a p.r.n. basis. She does have a follow-up appointment with her neurologist. The husband leaves with good understanding and agreement with the above management plan. Electronically Signed   By: Luanne Bras M.D.   On: 09/06/2017 18:31    Assessment/Plan 1. Dysarthria -speech much more garbled, lethargic, nearly unresponsive, pale, cool extremities, cheyne-stokes like abdominal breathing all suggesting to me she may have had another stroke event -at first, Mr. Balducci was thinking he wanted her sent immediately back to the hospital, but then asked my opinion about what was best for her at this time -knowing Mrs. Nyquist's overall picture, minimal progress from my view (major progress from his view) with therapy post-major stroke, baseline depression and dementia, decreased quality of life, I recommended we keep her at [facility] and make her comfortable rather than pursuing invasive investigations at the hospital  -I explained that she  may not survive long given her current state -Mr. Thieme expressed understanding of this and contacted their children and I wrote orders for her comfort measures including prn roxanol and ativan for pain and respiratory distress as well as atropine for her increased secretions  2. Pallor -suspect brainstem acute stroke event  -will keep comfortable and monitor -obtain basic cbc and bmp to r/o delirium procress, has no other localizing symptoms  3. Respiratory distress -abdominal breathing suggestive near death at this point -roxanol for comfort  4. Combined receptive and expressive aphasia due to acute stroke ([place]) -from stroke she'd had before limiting communication, causing her to be frustrated and more depressed than her baseline -has worked with speech therapy and some words usually understood, but tonight, just gibberish and garbled speech before I entered and minimal responsiveness during my visit  5. 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 Mikeala Girdler, D.O. [facility] [address], [place] [address] Cell Phone (Mon-Fri 8am-5pm):  [phone] On Call:  [phone] & follow prompts after 5pm & weekends Office Phone:  [phone] Office Fax:  [phone]

## 2017-09-15 NOTE — Telephone Encounter (Signed)
I called and left a message on Dr. Cyndi Lennert cell phone to call me back to discuss the matter.

## 2017-09-15 NOTE — Telephone Encounter (Signed)
Dr. Leonie Man can you call Dr. Mariea Clonts per husband see last note. Here is her phone number thanks Cell Phone (Mon-Fri 8am-5pm):  [phone].

## 2017-09-15 NOTE — Telephone Encounter (Signed)
Pt Husband(on DPR) has called stating pt is at [facility] and he states pt had an event last night which he and pt thought was a stroke.  This morning pt husband states he notices a difference in pt speech.  RN stated she did not notice a difference.  Pt husband states Dr Hollace Kinnier( Dr for [facility]) witnessed the event and made no indication that she would reach out to Dr Leonie Man re: the event. Pt husband is asking if Dr Leonie Man will reach out to Dr Hollace Kinnier as to what took place with pt.  Pt husband made aware that Dr Leonie Man is in the hospital this week, he stated that Dr Erlinda Hong has also seen pt before and he wants someone to contact Dr Hollace Kinnier.  Pt was told the message would be forwarded to RN for Dr Leonie Man but he was reminded that Dr Leonie Man will not be back in the office until Monday of next week.

## 2017-09-16 ENCOUNTER — Ambulatory Visit (INDEPENDENT_AMBULATORY_CARE_PROVIDER_SITE_OTHER): Payer: Medicare Other | Admitting: Vascular Surgery

## 2017-09-16 ENCOUNTER — Encounter: Payer: Self-pay | Admitting: Vascular Surgery

## 2017-09-16 VITALS — BP 111/79 | HR 93 | Temp 97.4°F | Resp 24 | Ht 65.0 in | Wt 187.2 lb

## 2017-09-16 DIAGNOSIS — F05 Delirium due to known physiological condition: Secondary | ICD-10-CM | POA: Diagnosis not present

## 2017-09-16 DIAGNOSIS — I724 Aneurysm of artery of lower extremity: Secondary | ICD-10-CM

## 2017-09-16 DIAGNOSIS — F339 Major depressive disorder, recurrent, unspecified: Secondary | ICD-10-CM | POA: Diagnosis not present

## 2017-09-16 DIAGNOSIS — D649 Anemia, unspecified: Secondary | ICD-10-CM | POA: Diagnosis not present

## 2017-09-16 LAB — BASIC METABOLIC PANEL
BUN: 16 (ref 4–21)
CREATININE: 0.7 (ref 0.5–1.1)
Glucose: 140
POTASSIUM: 3.4 (ref 3.4–5.3)
SODIUM: 143 (ref 137–147)

## 2017-09-16 LAB — CBC AND DIFFERENTIAL
HCT: 37 (ref 36–46)
Hemoglobin: 11.4 — AB (ref 12.0–16.0)
PLATELETS: 158 (ref 150–399)
WBC: 6.3

## 2017-09-16 NOTE — Progress Notes (Signed)
Patient is an 83 year old female who returns for follow-up today after repair of a right femoral pseudoaneurysm. She had some difficulty healing of the groin wound and returns today for a check of this. Pseudoaneurysm repair was on 06/23/2017. This was secondary to a neuro intervention for stroke. She has made significant recovery from her stroke but still is living in a rehabilitation facility. Her facility reported that occasionally the scar has reopened.  Physical exam:  Vitals:   09/16/17 1112  BP: 111/79  Pulse: 93  Resp: (!) 24  Temp: (!) 97.4 F (36.3 C)  TempSrc: Oral  SpO2: 97%  Weight: 187 lb 3.2 oz (84.9 kg)  Height: 5\' 5"  (1.651 m)    Extremities: Right groin well-healed wound no incisional drainage no pulsatile mass some contraction of skin.   Assessment: Healed from right femoral artery pseudoaneurysm repair. The patient has some skin contracture from her scar. She may occasionally gets skin tears in this area. This should heal with local wound care.  Plan: Patient will follow-up in as-needed basis.  Ruta Hinds, MD [facility] Office: [phone] Pager: [phone]

## 2017-09-17 DIAGNOSIS — I69393 Ataxia following cerebral infarction: Secondary | ICD-10-CM | POA: Diagnosis not present

## 2017-09-17 DIAGNOSIS — I69320 Aphasia following cerebral infarction: Secondary | ICD-10-CM | POA: Diagnosis not present

## 2017-09-17 DIAGNOSIS — I69398 Other sequelae of cerebral infarction: Secondary | ICD-10-CM | POA: Diagnosis not present

## 2017-09-17 DIAGNOSIS — I69851 Hemiplegia and hemiparesis following other cerebrovascular disease affecting right dominant side: Secondary | ICD-10-CM | POA: Diagnosis not present

## 2017-09-17 DIAGNOSIS — I69391 Dysphagia following cerebral infarction: Secondary | ICD-10-CM | POA: Diagnosis not present

## 2017-09-17 DIAGNOSIS — I69311 Memory deficit following cerebral infarction: Secondary | ICD-10-CM | POA: Diagnosis not present

## 2017-09-17 NOTE — Progress Notes (Signed)
I agree with your assessment and judgement. Comfort care would be appropriate. Appreciate great care your are providing

## 2017-09-17 NOTE — Telephone Encounter (Signed)
Dr Mariea Clonts sent me epic message on this and I agreed with her assessment and mangement

## 2017-09-20 DIAGNOSIS — I724 Aneurysm of artery of lower extremity: Secondary | ICD-10-CM | POA: Diagnosis not present

## 2017-09-20 DIAGNOSIS — R1312 Dysphagia, oropharyngeal phase: Secondary | ICD-10-CM | POA: Diagnosis not present

## 2017-09-20 DIAGNOSIS — I69391 Dysphagia following cerebral infarction: Secondary | ICD-10-CM | POA: Diagnosis not present

## 2017-09-20 DIAGNOSIS — I69851 Hemiplegia and hemiparesis following other cerebrovascular disease affecting right dominant side: Secondary | ICD-10-CM | POA: Diagnosis not present

## 2017-09-20 DIAGNOSIS — I69398 Other sequelae of cerebral infarction: Secondary | ICD-10-CM | POA: Diagnosis not present

## 2017-09-20 DIAGNOSIS — I63312 Cerebral infarction due to thrombosis of left middle cerebral artery: Secondary | ICD-10-CM | POA: Diagnosis not present

## 2017-09-20 DIAGNOSIS — I69393 Ataxia following cerebral infarction: Secondary | ICD-10-CM | POA: Diagnosis not present

## 2017-09-21 DIAGNOSIS — I63312 Cerebral infarction due to thrombosis of left middle cerebral artery: Secondary | ICD-10-CM | POA: Diagnosis not present

## 2017-09-21 DIAGNOSIS — I69851 Hemiplegia and hemiparesis following other cerebrovascular disease affecting right dominant side: Secondary | ICD-10-CM | POA: Diagnosis not present

## 2017-09-21 DIAGNOSIS — I69393 Ataxia following cerebral infarction: Secondary | ICD-10-CM | POA: Diagnosis not present

## 2017-09-21 DIAGNOSIS — R1312 Dysphagia, oropharyngeal phase: Secondary | ICD-10-CM | POA: Diagnosis not present

## 2017-09-21 DIAGNOSIS — I69391 Dysphagia following cerebral infarction: Secondary | ICD-10-CM | POA: Diagnosis not present

## 2017-09-21 DIAGNOSIS — I69398 Other sequelae of cerebral infarction: Secondary | ICD-10-CM | POA: Diagnosis not present

## 2017-09-23 ENCOUNTER — Encounter: Payer: Self-pay | Admitting: Adult Health

## 2017-09-23 ENCOUNTER — Non-Acute Institutional Stay (SKILLED_NURSING_FACILITY): Payer: Medicare Other | Admitting: Adult Health

## 2017-09-23 DIAGNOSIS — I10 Essential (primary) hypertension: Secondary | ICD-10-CM | POA: Diagnosis not present

## 2017-09-23 DIAGNOSIS — I5043 Acute on chronic combined systolic (congestive) and diastolic (congestive) heart failure: Secondary | ICD-10-CM | POA: Diagnosis not present

## 2017-09-23 DIAGNOSIS — F0151 Vascular dementia with behavioral disturbance: Secondary | ICD-10-CM

## 2017-09-23 DIAGNOSIS — F01518 Vascular dementia, unspecified severity, with other behavioral disturbance: Secondary | ICD-10-CM

## 2017-09-23 NOTE — Progress Notes (Signed)
Location:  Occupational psychologist of Service:  SNF (31) Provider:   Cindi Carbon, [place] [phone]   Josetta Huddle, MD  Patient Care Team: Josetta Huddle, MD as PCP - General (Internal Medicine) Community, [facility], Leonie Green, MD as Consulting Physician (Cardiology) Warden Fillers, MD as Consulting Physician (Ophthalmology) Belva Crome, MD as Consulting Physician (Cardiology) Gayland Curry, DO as Consulting Physician (Geriatric Medicine) Elam Dutch, MD as Consulting Physician (Vascular Surgery) Garvin Fila, MD as Consulting Physician (Neurology)  Extended Emergency Contact Information Primary Emergency Contact: Ulice Dash Address: [address]          [place], [place] [address] Johnnette Litter of [place] Phone: [phone] Work Phone: [phone] Mobile Phone: [phone] Relation: Spouse Secondary Emergency Contact: Ok Edwards Address: [address]          [place], [place] [address] Johnnette Litter of [place] Phone: [phone] Relation: Daughter  Code Status:  DNR Goals of care: Advanced Directive information Advanced Directives 09/07/2017  Does Patient Have a Medical Advance Directive? Yes  Type of Advance Directive Out of facility DNR (pink MOST or yellow form);Healthcare Power of Attorney  Does patient want to make changes to medical advance directive? No - Patient declined  Copy of [place] in Chart? Yes  Would patient like information on creating a medical advance directive? -  Pre-existing out of facility DNR order (yellow form or pink MOST form) Pink MOST form placed in chart (order not valid for inpatient use)     Chief Complaint  Patient presents with  . Acute Visit    weight gain    HPI:  Pt is a 83 y.o. female seen today for an acute visit for 4 lb weight gain in 1 day.Her weight is 189 as of 09/23/2017 was in the 185-187 range prior  to this visit. She has a hx of CAD and CHF with an EF of 40-45% moderate to severe MR, severe LAE, and mild TR with moderate pulmonary htn.  Staff report increased edema in both ankles as well. She is not short of breath and has normal 02 sats. There are times where she has abd breathing but this has been present since her arrival to the rehab unit of her CVA and has been attributed to anxiety, although it is unclear as she is a poor historian. She was seen by Dr. Mariea Clonts for another acute stroke episode and placed on comfort care as it looked like there was impending death on 10/01/2023 but she has rallied once again and back to her baseline function.  She has used ativan twice in the past week for agitation at night. Vistaril has been used one time for agitation, and Roxanol has been used once for agitation in the past week.  Past Medical History:  Diagnosis Date  . Anemia   . CAD (coronary artery disease)    a. STEMI 10/09/13 - 100% LAD - following initial angioplasty, ? Spontaneous dissection vs related to ballon. s/p complex PCI with 2 overlapping DES to LAD. Adm complicated by VDRF, cardiogenic shock, peri-MI PAF with NSR on amiodarone. Residual mod RCA disease.   Marland Kitchen CAD S/P percutaneous coronary angioplasty 10/09/13   DES x 2 to Mid LAD, 2.5 x 38, 2.5 x 12 Promus (covering extensive disection), moderate RCA disesase  . Cardiogenic shock ([place])    a. 09/2013 - during admission for STEMI.  . Depression   . Dyslipidemia   .  H/O Acute respiratory failure due to hypoxia 10/10/2013   a. 09/2013 - secondary to anterior STEMI related pulmonary edema (resolved).  . Hypertension   . Hypothyroidism   . Ischemic cardiomyopathy    a. EF 30-35% during 09/2013 adm for STEMI. b. Repeat Echo EF 40-45% with distal LAD hypokinesis (10/16/14 following STEMI recovery).  . Migraine headache    a. Remotely.  . Orthostatic hypotension    a. Adm 10/2013 for this, felt due to Lasix. Not on regular diuretic due to  this/dehydration.  Marland Kitchen PAF (paroxysmal atrial fibrillation) ([place])    a. 09/2013 - peri-MI, NSR at discharge on amiodarone.  . Sinus bradycardia   . Stroke [facility])    Past Surgical History:  Procedure Laterality Date  . CORONARY ANGIOPLASTY WITH STENT PLACEMENT  10/09/13   2 overlapping Promus DES to mid LAD, occlusion/dissection (2.5 x 38, 2.5 x 12)  . FEMORAL ARTERY EXPLORATION Right 06/23/2017   Procedure: repair right femoral pseudoanerysm; evacuation hemotoma;  Surgeon: Elam Dutch, MD;  Location: [facility] OR;  Service: Vascular;  Laterality: Right;  . IR ANGIO INTRA EXTRACRAN SEL COM CAROTID INNOMINATE UNI R MOD SED  06/20/2017  . IR PERCUTANEOUS ART THROMBECTOMY/INFUSION INTRACRANIAL INC DIAG ANGIO  06/20/2017  . IR RADIOLOGIST EVAL & MGMT  09/06/2017  . IR US GUIDE BX ASP/DRAIN  06/23/2017  . LEFT HEART CATHETERIZATION WITH CORONARY ANGIOGRAM N/A 10/09/2013   Procedure: LEFT HEART CATHETERIZATION WITH CORONARY ANGIOGRAM;  Surgeon: Leonie Man, MD;  Location: [facility] C[facility] LAB;  Service: Cardiovascular;  Laterality: N/A;  . RADIOLOGY WITH ANESTHESIA N/A 06/20/2017   Procedure: RADIOLOGY WITH ANESTHESIA;  Surgeon: Luanne Bras, MD;  Location: [facility];  Service: Radiology;  Laterality: N/A;  . SHOULDER SURGERY      Allergies  Allergen Reactions  . Ace Inhibitors Anaphylaxis  . Beta Adrenergic Blockers Anaphylaxis  . Iohexol Anaphylaxis     Code: HIVES, Desc: anaphylactic shock s/p contrast injection many yrs ago--suggested that pt NEVER have iv contrast//a.c., Onset Date: [id number]   . Atorvastatin Rash  . Latex Rash  . Penicillins Swelling and Rash    Details are unknown  . Sulfa Antibiotics Rash    Outpatient Encounter Medications as of 09/23/2017  Medication Sig  . atropine 1 % ophthalmic solution Place 1 drop under the tongue every 6 (six) hours as needed.  . cloNIDine (CATAPRES) 0.1 MG tablet Take 0.1 mg by mouth every 6 (six) hours as needed. SBP >160  . hydrOXYzine  (ATARAX/VISTARIL) 25 MG tablet Take 25 mg by mouth daily as needed.  Marland Kitchen LORazepam (ATIVAN) 0.5 MG tablet Take 0.5 mg by mouth every 6 (six) hours as needed for anxiety.  Marland Kitchen morphine (ROXANOL) 20 MG/ML concentrated solution Take 5 mg by mouth every 4 (four) hours as needed for severe pain.  Marland Kitchen acetaminophen (TYLENOL) 325 MG tablet Take 650 mg by mouth every 6 (six) hours as needed for mild pain.  Marland Kitchen apixaban (ELIQUIS) 5 MG TABS tablet Take 1 tablet (5 mg total) by mouth 2 (two) times daily.  Marland Kitchen aspirin 81 MG chewable tablet Chew 1 tablet (81 mg total) by mouth daily.  . bisacodyl (DULCOLAX) 10 MG suppository Place 10 mg rectally as needed for moderate constipation.  Marland Kitchen buPROPion (WELLBUTRIN SR) 150 MG 12 hr tablet Take 75 mg by mouth every morning.   . calcium-vitamin D (OSCAL WITH D) 500-200 MG-UNIT per tablet Take 1 tablet by mouth daily.   . carvedilol (COREG) 6.25 MG tablet Take 1  tablet (6.25 mg total) by mouth 2 (two) times daily with a meal.  . cholecalciferol (VITAMIN D) 1000 UNITS tablet Take 1,000 Units by mouth daily.  . clobetasol ointment (TEMOVATE) 6.28 % Apply 1 application topically 2 (two) times daily as needed. (affected areas)--also thighs  . escitalopram (LEXAPRO) 10 MG tablet Take 10 mg daily by mouth.  . fexofenadine (ALLEGRA) 60 MG tablet Take 60 mg by mouth 2 (two) times daily as needed for allergies or rhinitis.  . furosemide (LASIX) 20 MG tablet Take 10 mg by mouth daily.  . magnesium hydroxide (MILK OF MAGNESIA) 400 MG/5ML suspension Take 45 mLs by mouth daily as needed for mild constipation.  . Melatonin 5 MG TABS Take 1 tablet by mouth at bedtime as needed.  . montelukast (SINGULAIR) 10 MG tablet Take 10 mg by mouth at bedtime.  . Multiple Vitamin (MULTIVITAMIN WITH MINERALS) TABS tablet Take 1 tablet by mouth daily.  . nitroGLYCERIN (NITROSTAT) 0.4 MG SL tablet Place 0.4 mg under the tongue every 5 (five) minutes as needed for chest pain.  Marland Kitchen omeprazole (PRILOSEC) 20 MG  capsule Take 20 mg by mouth daily.   [name] Glycol-Propyl Glycol (SYSTANE OP) Apply 1 drop to eye daily as needed (dry eyes).  . polyethylene glycol (MIRALAX / GLYCOLAX) packet Take 17 g by mouth daily.  . Protein (UNJURY PO) Take 0.5 scoop by mouth 2 (two) times daily.  . rosuvastatin (CRESTOR) 10 MG tablet Take 1 tablet (10 mg total) by mouth 3 (three) times a week. (Mondays, Wednesdays, and Fridays)  . sennosides-docusate sodium (SENOKOT-S) 8.6-50 MG tablet Take 2 tablets by mouth at bedtime.  Marland Kitchen SYNTHROID 100 MCG tablet TAKE 1 TABLET (161mcg) IN THE MORNING ON AN EMPTY STOMACH.  . [DISCONTINUED] QUEtiapine (SEROQUEL) 25 MG tablet Take 12.5 mg by mouth 2 (two) times daily as needed.   Facility-Administered Encounter Medications as of 09/23/2017  Medication  . etomidate (AMIDATE) injection  . rocuronium (ZEMURON) injection  . succinylcholine (ANECTINE) injection    Review of Systems  Unable to perform ROS: Dementia    Immunization History  Administered Date(s) Administered  . Influenza Split 08/06/2010, 08/05/2011, 07/26/2012  . Influenza,inj,Quad PF,6+ Mos 08/20/2016  . Pneumococcal Conjugate-13 04/04/2014   Pertinent  Health Maintenance Due  Topic Date Due  . DEXA SCAN  10/16/1997  . PNA vac Low Risk Adult (2 of 2 - PPSV23) 04/05/2015  . INFLUENZA VACCINE  05/19/2017   Fall Risk  01/20/2017 09/16/2016 07/29/2016  Falls in the past year? No No No   Functional Status Survey:    Vitals:   09/23/17 1022  BP: (!) 153/81  Pulse: (!) 54  Weight: 189 lb 6.4 oz (85.9 kg)   Body mass index is 31.52 kg/m.  Wt Readings from Last 3 Encounters:  09/23/17 189 lb 6.4 oz (85.9 kg)  09/16/17 187 lb 3.2 oz (84.9 kg)  09/14/17 187 lb 3.2 oz (84.9 kg)    Physical Exam  Constitutional: No distress.  HENT:  Head: Normocephalic and atraumatic.  Neck: No JVD present.  Cardiovascular: Normal rate.  No murmur heard. Irregular.  BLE edema +1 to both ankles with pitting    Pulmonary/Chest: Effort normal and breath sounds normal. She has no wheezes.  Slight increased work of breathing with abd muscle use  Abdominal: Soft. Bowel sounds are normal.  Neurological: She is alert.  Oriented to self only, able to f/c  Skin: Skin is warm and dry. She is not diaphoretic.  Psychiatric: She  has a normal mood and affect.    Labs reviewed: Recent Labs    06/27/17 1725 06/28/17 0510 06/28/17 1745 06/29/17 0431 06/30/17 0413 07/02/17 0651 07/03/17 0257 07/13/17 08/23/17 1128 09/16/17  NA 140 142  --  140 141 141 145 140 142 143  K 2.8* 3.5  --  3.5 3.4* 3.0* 3.0* 3.3* 3.5 3.4  CL 104 107  --  107 103 103 104  --   --   --   CO2 28 30  --  29 33* 30 33*  --   --   --   GLUCOSE 104* 142*  --  139* 115* 146* 127*  --   --   --   BUN 28* 34*  --  33* 20 29* 35* 19 17 16   CREATININE 0.55 0.63  --  0.62 0.69 0.82 0.86 0.7 0.6 0.7  CALCIUM 8.7* 9.1  --  9.3 9.5 10.1 10.5*  --   --   --   MG 2.1 1.8  --   --  1.6*  --   --   --   --   --   PHOS 1.9* 1.6* 3.5 2.1* 2.8  --   --   --   --   --    Recent Labs    01/26/17 0855 06/20/17 0819 06/21/17 0349 06/24/17 0410 08/23/17 1128  AST 23 26  --   --  23  ALT 20 23  --   --  25  ALKPHOS 84 72  --   --  95  BILITOT 0.5 0.6  --   --   --   PROT 6.7 6.7  --   --   --   ALBUMIN 4.2 3.9 3.0* 2.7*  --    Recent Labs    06/21/17 0349  06/27/17 0445 06/28/17 0510 06/29/17 0431 07/02/17 0651 07/03/17 0257 07/13/17 08/23/17 1128 09/16/17  WBC 13.8*   < > 10.0 9.1 9.2 9.4 10.9* 6.6 8.6 6.3  NEUTROABS 11.1*  --  6.9 6.1  --   --   --   --   --   --   HGB 11.9*   < > 8.1* 8.2* 8.1* 11.0* 10.9* 12.2 11.5* 11.4*  HCT 37.4   < > 24.9* 26.2* 26.3* 35.1* 35.1* 39 36 37  MCV 94.7   < > 92.6 93.9 95.6 94.9 95.9  --   --   --   PLT 174   < > 172 196 201 256 280 269 284 158   < > = values in this interval not displayed.   Lab Results  Component Value Date   TSH 2.03 05/11/2016   Lab Results  Component Value Date    HGBA1C 5.4 06/21/2017   Lab Results  Component Value Date   CHOL 133 06/21/2017   HDL 45 06/21/2017   LDLCALC 64 06/21/2017   TRIG 99 06/26/2017   CHOLHDL 3.0 06/21/2017    Significant Diagnostic Results in last 30 days:  Ir Radiologist Eval & Mgmt  Result Date: 09/07/2017 EXAM: ESTABLISHED PATIENT OFFICE VISIT CHIEF COMPLAINT: Speech difficulty. Current Pain Level: 1-10 HISTORY OF PRESENT ILLNESS: The patient is an 83 year old right-handed lady who is status post endovascular revascularization of symptomatic left middle cerebral artery occlusion with mechanical thrombectomy on 06/20/2017. The patient underwent complete revascularization. Subsequently the patient was discharged to rehab where she is now. The patient is accompanied by her husband. According him the patient has made significant progress in terms  of motor and physical function. However, the patient continues to have significant difficulties in terms of speech problems. This is mostly in terms of comprehension and also with expression. This too, however, has been improved to some degree according to her husband. The patient at the present time is at [facility]. She is able to feed herself, and sit up in a chair and also stand up under her own power. She walks with a walker in the presence of aid. She is able to feed herself. She is unable to clean herself or bathe herself. She is able to stand up from a chair. The patient herself denies any visual problems in terms of blindness or blurred vision or diplopia. Her appetite is reasonable.  Her weight is steady. She, otherwise, grossly denies symptoms related to cardiovascular, respiratory, GI or GU systems. The patient does have issues with depression which she had prior to the event also. Recently her medications in this regard were switched. Present medications: Tylenol. Eliquis. Aspirin 81 mg a day. Biotin. Wellbutrin. Calcium vitamin-D. Coreg. Cholecalciferol tablets. Lexapro. Allegra.  Furosemide. Singulair. Multi vitamins. Nitrostat as needed sublingually. Omeprazole. Polyethyl glycol-Propyl eyedrops. Crestor. Senokot. Zoloft has been stopped. Allergies: Ace inhibitors causes anaphylaxis. Beta Adrenergic blockers causes anaphylaxis. Iodinated contrast causes anaphylaxis. Atorvastatin causes rash. Latex causes rash. Penicillins cause swelling and rash. Sulfa antibiotics cause rash. Review of systems as above. PHYSICAL EXAMINATION: Appears in no acute distress. Patient has difficulty with hearing in both her ears. She follows visual cues appropriately. However, she has difficulty following commands by voice. The patient is able to lift both arms equally above her head. She is able to lift both her lower extremities also to 3+ over 5 while sitting in a chair. ASSESSMENT AND PLAN: The patient's post treatment arteriograms were reviewed with her husband. A complete reperfusion of the occluded left middle cerebral artery was achieved with mechanical thrombectomy. It was stressed to the patient's husband to continue with daily physical therapy, occupational therapy and speech therapy exercises. Questions were answered to his satisfaction. The patient will be seen on a p.r.n. basis. She does have a follow-up appointment with her neurologist. The husband leaves with good understanding and agreement with the above management plan. Electronically Signed   By: Luanne Bras M.D.   On: 09/06/2017 18:31    Assessment/Plan  1) Acute on chronic combined systolic and diastolic CHF Lasix 40 mg with Kdur 20 meq x 1 dose today In am increase lasix to 20 mg qd no stop date and add Kdur 10 meq qd no stop date (since she continues to need prn doses) Compression hose on in the am and off in the pm (due to edema and difficulty getting shoes on)  2) HTN Running on the high side, lasix will help No aggressive management for her BP, just controlling the edema/symptoms  3) Vascular dementia with behavioral  disturbance Continue prn ativan and vistaril due to periods of agitation and anxiety   Family/ staff Communication: discussed with staff/resident  Labs/tests ordered:  NA

## 2017-09-24 DIAGNOSIS — I63312 Cerebral infarction due to thrombosis of left middle cerebral artery: Secondary | ICD-10-CM | POA: Diagnosis not present

## 2017-09-24 DIAGNOSIS — I69393 Ataxia following cerebral infarction: Secondary | ICD-10-CM | POA: Diagnosis not present

## 2017-09-24 DIAGNOSIS — R1312 Dysphagia, oropharyngeal phase: Secondary | ICD-10-CM | POA: Diagnosis not present

## 2017-09-24 DIAGNOSIS — I69398 Other sequelae of cerebral infarction: Secondary | ICD-10-CM | POA: Diagnosis not present

## 2017-09-24 DIAGNOSIS — I69391 Dysphagia following cerebral infarction: Secondary | ICD-10-CM | POA: Diagnosis not present

## 2017-09-24 DIAGNOSIS — I69851 Hemiplegia and hemiparesis following other cerebrovascular disease affecting right dominant side: Secondary | ICD-10-CM | POA: Diagnosis not present

## 2017-09-28 DIAGNOSIS — I69851 Hemiplegia and hemiparesis following other cerebrovascular disease affecting right dominant side: Secondary | ICD-10-CM | POA: Diagnosis not present

## 2017-09-28 DIAGNOSIS — I69391 Dysphagia following cerebral infarction: Secondary | ICD-10-CM | POA: Diagnosis not present

## 2017-09-28 DIAGNOSIS — I69398 Other sequelae of cerebral infarction: Secondary | ICD-10-CM | POA: Diagnosis not present

## 2017-09-28 DIAGNOSIS — R1312 Dysphagia, oropharyngeal phase: Secondary | ICD-10-CM | POA: Diagnosis not present

## 2017-09-28 DIAGNOSIS — I69393 Ataxia following cerebral infarction: Secondary | ICD-10-CM | POA: Diagnosis not present

## 2017-09-28 DIAGNOSIS — I63312 Cerebral infarction due to thrombosis of left middle cerebral artery: Secondary | ICD-10-CM | POA: Diagnosis not present

## 2017-09-29 DIAGNOSIS — I69391 Dysphagia following cerebral infarction: Secondary | ICD-10-CM | POA: Diagnosis not present

## 2017-09-29 DIAGNOSIS — I69398 Other sequelae of cerebral infarction: Secondary | ICD-10-CM | POA: Diagnosis not present

## 2017-09-29 DIAGNOSIS — R1312 Dysphagia, oropharyngeal phase: Secondary | ICD-10-CM | POA: Diagnosis not present

## 2017-09-29 DIAGNOSIS — I69851 Hemiplegia and hemiparesis following other cerebrovascular disease affecting right dominant side: Secondary | ICD-10-CM | POA: Diagnosis not present

## 2017-09-29 DIAGNOSIS — I69393 Ataxia following cerebral infarction: Secondary | ICD-10-CM | POA: Diagnosis not present

## 2017-09-29 DIAGNOSIS — I63312 Cerebral infarction due to thrombosis of left middle cerebral artery: Secondary | ICD-10-CM | POA: Diagnosis not present

## 2017-10-01 ENCOUNTER — Non-Acute Institutional Stay (SKILLED_NURSING_FACILITY): Payer: Medicare Other | Admitting: Adult Health

## 2017-10-01 ENCOUNTER — Encounter: Payer: Self-pay | Admitting: Adult Health

## 2017-10-01 DIAGNOSIS — F5101 Primary insomnia: Secondary | ICD-10-CM | POA: Diagnosis not present

## 2017-10-01 DIAGNOSIS — I69393 Ataxia following cerebral infarction: Secondary | ICD-10-CM | POA: Diagnosis not present

## 2017-10-01 DIAGNOSIS — R1312 Dysphagia, oropharyngeal phase: Secondary | ICD-10-CM | POA: Diagnosis not present

## 2017-10-01 DIAGNOSIS — F411 Generalized anxiety disorder: Secondary | ICD-10-CM

## 2017-10-01 DIAGNOSIS — I69851 Hemiplegia and hemiparesis following other cerebrovascular disease affecting right dominant side: Secondary | ICD-10-CM | POA: Diagnosis not present

## 2017-10-01 DIAGNOSIS — I69398 Other sequelae of cerebral infarction: Secondary | ICD-10-CM | POA: Diagnosis not present

## 2017-10-01 DIAGNOSIS — I63312 Cerebral infarction due to thrombosis of left middle cerebral artery: Secondary | ICD-10-CM | POA: Diagnosis not present

## 2017-10-01 DIAGNOSIS — I69391 Dysphagia following cerebral infarction: Secondary | ICD-10-CM | POA: Diagnosis not present

## 2017-10-01 NOTE — Progress Notes (Signed)
Location:  Occupational psychologist of Service:  SNF (31) Provider:   Cindi Carbon, [place] [phone]   Josetta Huddle, MD  Patient Care Team: Josetta Huddle, MD as PCP - General (Internal Medicine) Community, [facility], Leonie Green, MD as Consulting Physician (Cardiology) Warden Fillers, MD as Consulting Physician (Ophthalmology) Belva Crome, MD as Consulting Physician (Cardiology) Gayland Curry, DO as Consulting Physician (Geriatric Medicine) Elam Dutch, MD as Consulting Physician (Vascular Surgery) Garvin Fila, MD as Consulting Physician (Neurology)  Extended Emergency Contact Information Primary Emergency Contact: Ulice Dash Address: [address]          [place], [place] [address] Johnnette Litter of [place] Phone: [phone] Work Phone: [phone] Mobile Phone: [phone] Relation: Spouse Secondary Emergency Contact: Ok Edwards Address: [address]          [place], [place] [address] Johnnette Litter of [place] Phone: [phone] Relation: Daughter  Code Status:  DNR Goals of care: Advanced Directive information Advanced Directives 09/07/2017  Does Patient Have a Medical Advance Directive? Yes  Type of Advance Directive Out of facility DNR (pink MOST or yellow form);Healthcare Power of Attorney  Does patient want to make changes to medical advance directive? No - Patient declined  Copy of [place] in Chart? Yes  Would patient like information on creating a medical advance directive? -  Pre-existing out of facility DNR order (yellow form or pink MOST form) Pink MOST form placed in chart (order not valid for inpatient use)     Chief Complaint  Patient presents with  . Acute Visit    night time agitation    HPI:  Pt is a 83 y.o. [place] seen today for an acute visit for agitation at night time. She resides in [facility] after a CVA with a  complicated hospital stay. Since her arrival there have been times where she is anxious at night, with behaviors such as yelling out, trying to get out of bed, restless movement, etc. She has tried vistaril and seroquel with no benefit. At one point it appeared she was in the dying process and roxanol and ativan were ordered but she has recovered from the set back and is set to move to skilled care today. She was given ativan 0.25 mg last night with no benefit. She also takes melatonin 5 mg qhs and does not sleep well. Due to her mental state she is not able to contribute to the hx. There is a long term history of anxiety and depression per her husband. She is followed by Dr Casimiro Needle here as well.    Past Medical History:  Diagnosis Date  . Anemia   . CAD (coronary artery disease)    a. STEMI 10/09/13 - 100% LAD - following initial angioplasty, ? Spontaneous dissection vs related to ballon. s/p complex PCI with 2 overlapping DES to LAD. Adm complicated by VDRF, cardiogenic shock, peri-MI PAF with NSR on amiodarone. Residual mod RCA disease.   Marland Kitchen CAD S/P percutaneous coronary angioplasty 10/09/13   DES x 2 to Mid LAD, 2.5 x 38, 2.5 x 12 Promus (covering extensive disection), moderate RCA disesase  . Cardiogenic shock ([place])    a. 09/2013 - during admission for STEMI.  . Depression   . Dyslipidemia   . H/O Acute respiratory failure due to hypoxia 10/10/2013   a. 09/2013 - secondary to anterior STEMI related pulmonary edema (resolved).  . Hypertension   . Hypothyroidism   .  Ischemic cardiomyopathy    a. EF 30-35% during 09/2013 adm for STEMI. b. Repeat Echo EF 40-45% with distal LAD hypokinesis (10/16/14 following STEMI recovery).  . Migraine headache    a. Remotely.  . Orthostatic hypotension    a. Adm 10/2013 for this, felt due to Lasix. Not on regular diuretic due to this/dehydration.  Marland Kitchen PAF (paroxysmal atrial fibrillation) ([place])    a. 09/2013 - peri-MI, NSR at discharge on amiodarone.  . Sinus  bradycardia   . Stroke [facility])    Past Surgical History:  Procedure Laterality Date  . CORONARY ANGIOPLASTY WITH STENT PLACEMENT  10/09/13   2 overlapping Promus DES to mid LAD, occlusion/dissection (2.5 x 38, 2.5 x 12)  . FEMORAL ARTERY EXPLORATION Right 06/23/2017   Procedure: repair right femoral pseudoanerysm; evacuation hemotoma;  Surgeon: Elam Dutch, MD;  Location: [facility] OR;  Service: Vascular;  Laterality: Right;  . IR ANGIO INTRA EXTRACRAN SEL COM CAROTID INNOMINATE UNI R MOD SED  06/20/2017  . IR PERCUTANEOUS ART THROMBECTOMY/INFUSION INTRACRANIAL INC DIAG ANGIO  06/20/2017  . IR RADIOLOGIST EVAL & MGMT  09/06/2017  . IR US GUIDE BX ASP/DRAIN  06/23/2017  . LEFT HEART CATHETERIZATION WITH CORONARY ANGIOGRAM N/A 10/09/2013   Procedure: LEFT HEART CATHETERIZATION WITH CORONARY ANGIOGRAM;  Surgeon: Leonie Man, MD;  Location: [facility] CATH LAB;  Service: Cardiovascular;  Laterality: N/A;  . RADIOLOGY WITH ANESTHESIA N/A 06/20/2017   Procedure: RADIOLOGY WITH ANESTHESIA;  Surgeon: Luanne Bras, MD;  Location: [place];  Service: Radiology;  Laterality: N/A;  . SHOULDER SURGERY      Allergies  Allergen Reactions  . Ace Inhibitors Anaphylaxis  . Beta Adrenergic Blockers Anaphylaxis  . Iohexol Anaphylaxis     Code: HIVES, Desc: anaphylactic shock s/p contrast injection many yrs ago--suggested that pt NEVER have iv contrast//a.c., Onset Date: [id number]   . Atorvastatin Rash  . Latex Rash  . Penicillins Swelling and Rash    Details are unknown  . Sulfa Antibiotics Rash    Outpatient Encounter Medications as of 10/01/2017  Medication Sig  . potassium chloride (K-DUR) 10 MEQ tablet Take 10 mEq by mouth daily.  Marland Kitchen acetaminophen (TYLENOL) 325 MG tablet Take 650 mg by mouth every 6 (six) hours as needed for mild pain.  Marland Kitchen apixaban (ELIQUIS) 5 MG TABS tablet Take 1 tablet (5 mg total) by mouth 2 (two) times daily.  Marland Kitchen aspirin 81 MG chewable tablet Chew 1 tablet (81 mg total) by mouth daily.  Marland Kitchen  atropine 1 % ophthalmic solution Place 1 drop under the tongue every 6 (six) hours as needed.  . bisacodyl (DULCOLAX) 10 MG suppository Place 10 mg rectally as needed for moderate constipation.  Marland Kitchen buPROPion (WELLBUTRIN SR) 150 MG 12 hr tablet Take 75 mg by mouth every morning.   . calcium-vitamin D (OSCAL WITH D) 500-200 MG-UNIT per tablet Take 1 tablet by mouth daily.   . carvedilol (COREG) 6.25 MG tablet Take 1 tablet (6.25 mg total) by mouth 2 (two) times daily with a meal.  . cholecalciferol (VITAMIN D) 1000 UNITS tablet Take 1,000 Units by mouth daily.  . clobetasol ointment (TEMOVATE) 5.17 % Apply 1 application topically 2 (two) times daily as needed. (affected areas)--also thighs  . cloNIDine (CATAPRES) 0.1 MG tablet Take 0.1 mg by mouth every 6 (six) hours as needed. SBP >160  . escitalopram (LEXAPRO) 10 MG tablet Take 10 mg daily by mouth.  . fexofenadine (ALLEGRA) 60 MG tablet Take 60 mg by mouth 2 (two)  times daily as needed for allergies or rhinitis.  . furosemide (LASIX) 20 MG tablet Take 20 mg by mouth daily.   . hydrOXYzine (ATARAX/VISTARIL) 25 MG tablet Take 25 mg by mouth daily as needed.  Marland Kitchen LORazepam (ATIVAN) 0.5 MG tablet Take 0.5 mg by mouth every 6 (six) hours as needed for anxiety.  . magnesium hydroxide (MILK OF MAGNESIA) 400 MG/5ML suspension Take 45 mLs by mouth daily as needed for mild constipation.  . Melatonin 5 MG TABS Take 1 tablet by mouth at bedtime.   . montelukast (SINGULAIR) 10 MG tablet Take 10 mg by mouth at bedtime.  Marland Kitchen morphine (ROXANOL) 20 MG/ML concentrated solution Take 5 mg by mouth every 4 (four) hours as needed for severe pain.  . Multiple Vitamin (MULTIVITAMIN WITH MINERALS) TABS tablet Take 1 tablet by mouth daily.  . nitroGLYCERIN (NITROSTAT) 0.4 MG SL tablet Place 0.4 mg under the tongue every 5 (five) minutes as needed for chest pain.  Marland Kitchen omeprazole (PRILOSEC) 20 MG capsule Take 20 mg by mouth daily.   [name] Glycol-Propyl Glycol (SYSTANE OP)  Apply 1 drop to eye daily as needed (dry eyes).  . polyethylene glycol (MIRALAX / GLYCOLAX) packet Take 17 g by mouth daily.  . Protein (UNJURY PO) Take 0.5 scoop by mouth 2 (two) times daily.  . rosuvastatin (CRESTOR) 10 MG tablet Take 1 tablet (10 mg total) by mouth 3 (three) times a week. (Mondays, Wednesdays, and Fridays)  . sennosides-docusate sodium (SENOKOT-S) 8.6-50 MG tablet Take 2 tablets by mouth at bedtime.  Marland Kitchen SYNTHROID 100 MCG tablet TAKE 1 TABLET (181mcg) IN THE MORNING ON AN EMPTY STOMACH.   Facility-Administered Encounter Medications as of 10/01/2017  Medication  . etomidate (AMIDATE) injection  . rocuronium (ZEMURON) injection  . succinylcholine (ANECTINE) injection    Review of Systems  Unable to perform ROS: Dementia    Immunization History  Administered Date(s) Administered  . Influenza Split 08/06/2010, 08/05/2011, 07/26/2012  . Influenza,inj,Quad PF,6+ Mos 08/20/2016  . Influenza-Unspecified 08/12/2017  . Pneumococcal Conjugate-13 04/04/2014   Pertinent  Health Maintenance Due  Topic Date Due  . DEXA SCAN  10/16/1997  . PNA vac Low Risk Adult (2 of 2 - PPSV23) 04/05/2015  . INFLUENZA VACCINE  Completed   Fall Risk  01/20/2017 09/16/2016 07/29/2016  Falls in the past year? No No No   Functional Status Survey:    Vitals:   10/01/17 1047  BP: 128/84  Pulse: 89  Resp: 20  Temp: 98.1 F (36.7 C)  SpO2: 94%  Weight: 186 lb 9.6 oz (84.6 kg)   Body mass index is 31.05 kg/m.  Wt Readings from Last 3 Encounters:  10/01/17 186 lb 9.6 oz (84.6 kg)  09/23/17 189 lb 6.4 oz (85.9 kg)  09/16/17 187 lb 3.2 oz (84.9 kg)   Physical Exam  Constitutional: No distress.  HENT:  Head: Normocephalic and atraumatic.  Nose: Nose normal.  Mouth/Throat: Oropharynx is clear and moist. No oropharyngeal exudate.  Neck: No JVD present.  Cardiovascular: Normal rate.  No murmur heard. Irregular, BLE edema +1  Pulmonary/Chest: Effort normal. No respiratory distress.  She has no wheezes.  Bibasilar crackles  Abdominal: Soft. Bowel sounds are normal. She exhibits no distension.  Neurological: She is alert.  Oriented to self only, able to follow commands. Expressive aphasia noted  Skin: Skin is warm and dry. She is not diaphoretic.  Psychiatric: She has a normal mood and affect.  Nursing note and vitals reviewed.   Labs reviewed:  Recent Labs    06/27/17 1725 06/28/17 0510 06/28/17 1745 06/29/17 0431 06/30/17 0413 07/02/17 0651 07/03/17 0257 07/13/17 08/23/17 1128 09/16/17  NA 140 142  --  140 141 141 145 140 142 143  K 2.8* 3.5  --  3.5 3.4* 3.0* 3.0* 3.3* 3.5 3.4  CL 104 107  --  107 103 103 104  --   --   --   CO2 28 30  --  29 33* 30 33*  --   --   --   GLUCOSE 104* 142*  --  139* 115* 146* 127*  --   --   --   BUN 28* 34*  --  33* 20 29* 35* 19 17 16   CREATININE 0.55 0.63  --  0.62 0.69 0.82 0.86 0.7 0.6 0.7  CALCIUM 8.7* 9.1  --  9.3 9.5 10.1 10.5*  --   --   --   MG 2.1 1.8  --   --  1.6*  --   --   --   --   --   PHOS 1.9* 1.6* 3.5 2.1* 2.8  --   --   --   --   --    Recent Labs    01/26/17 0855 06/20/17 0819 06/21/17 0349 06/24/17 0410 08/23/17 1128  AST 23 26  --   --  23  ALT 20 23  --   --  25  ALKPHOS 84 72  --   --  95  BILITOT 0.5 0.6  --   --   --   PROT 6.7 6.7  --   --   --   ALBUMIN 4.2 3.9 3.0* 2.7*  --    Recent Labs    06/21/17 0349  06/27/17 0445 06/28/17 0510 06/29/17 0431 07/02/17 0651 07/03/17 0257 07/13/17 08/23/17 1128 09/16/17  WBC 13.8*   < > 10.0 9.1 9.2 9.4 10.9* 6.6 8.6 6.3  NEUTROABS 11.1*  --  6.9 6.1  --   --   --   --   --   --   HGB 11.9*   < > 8.1* 8.2* 8.1* 11.0* 10.9* 12.2 11.5* 11.4*  HCT 37.4   < > 24.9* 26.2* 26.3* 35.1* 35.1* 39 36 37  MCV 94.7   < > 92.6 93.9 95.6 94.9 95.9  --   --   --   PLT 174   < > 172 196 201 256 280 269 284 158   < > = values in this interval not displayed.   Lab Results  Component Value Date   TSH 1.Melinda 08/20/2017   Lab Results  Component Value  Date   HGBA1C 5.4 06/21/2017   Lab Results  Component Value Date   CHOL 133 06/21/2017   HDL 45 06/21/2017   LDLCALC 64 06/21/2017   TRIG 99 06/26/2017   CHOLHDL 3.0 06/21/2017    Significant Diagnostic Results in last 30 days:  Ir Radiologist Eval & Mgmt  Result Date: 09/07/2017 EXAM: ESTABLISHED PATIENT OFFICE VISIT CHIEF COMPLAINT: Speech difficulty. Current Pain Level: 1-10 HISTORY OF PRESENT ILLNESS: The patient is an 83 year old right-handed lady who is status post endovascular revascularization of symptomatic left middle cerebral artery occlusion with mechanical thrombectomy on 06/20/2017. The patient underwent complete revascularization. Subsequently the patient was discharged to rehab where she is now. The patient is accompanied by her husband. According him the patient has made significant progress in terms of motor and physical function. However, the patient continues to have  significant difficulties in terms of speech problems. This is mostly in terms of comprehension and also with expression. This too, however, has been improved to some degree according to her husband. The patient at the present time is at [facility] She is able to feed herself, and sit up in a chair and also stand up under her own power. She walks with a walker in the presence of aid. She is able to feed herself. She is unable to clean herself or bathe herself. She is able to stand up from a chair. The patient herself denies any visual problems in terms of blindness or blurred vision or diplopia. Her appetite is reasonable.  Her weight is steady. She, otherwise, grossly denies symptoms related to cardiovascular, respiratory, GI or GU systems. The patient does have issues with depression which she had prior to the event also. Recently her medications in this regard were switched. Present medications: Tylenol. Eliquis. Aspirin 81 mg a day. Biotin. Wellbutrin. Calcium vitamin-D. Coreg. Cholecalciferol tablets. Lexapro.  Allegra. Furosemide. Singulair. Multi vitamins. Nitrostat as needed sublingually. Omeprazole. Polyethyl glycol-Propyl eyedrops. Crestor. Senokot. Zoloft has been stopped. Allergies: Ace inhibitors causes anaphylaxis. Beta Adrenergic blockers causes anaphylaxis. Iodinated contrast causes anaphylaxis. Atorvastatin causes rash. Latex causes rash. Penicillins cause swelling and rash. Sulfa antibiotics cause rash. Review of systems as above. PHYSICAL EXAMINATION: Appears in no acute distress. Patient has difficulty with hearing in both her ears. She follows visual cues appropriately. However, she has difficulty following commands by voice. The patient is able to lift both arms equally above her head. She is able to lift both her lower extremities also to 3+ over 5 while sitting in a chair. ASSESSMENT AND PLAN: The patient's post treatment arteriograms were reviewed with her husband. A complete reperfusion of the occluded left middle cerebral artery was achieved with mechanical thrombectomy. It was stressed to the patient's husband to continue with daily physical therapy, occupational therapy and speech therapy exercises. Questions were answered to his satisfaction. The patient will be seen on a p.r.n. basis. She does have a follow-up appointment with her neurologist. The husband leaves with good understanding and agreement with the above management plan. Electronically Signed   By: Luanne Bras M.D.   On: 09/06/2017 18:31    Assessment/Plan   1. Generalized anxiety disorder Increase the ativan to 0.5 mg qhs and q 4 hrs prn F/U with Dr. Casimiro Needle later this month Her goals of care are comfort based with no hospitalizations.   2. Primary insomnia Increase melatonin to 10 mg qhs  Family/ staff Communication: discussed with the nurse  Labs/tests ordered:  NA

## 2017-10-04 DIAGNOSIS — I69398 Other sequelae of cerebral infarction: Secondary | ICD-10-CM | POA: Diagnosis not present

## 2017-10-04 DIAGNOSIS — I69393 Ataxia following cerebral infarction: Secondary | ICD-10-CM | POA: Diagnosis not present

## 2017-10-04 DIAGNOSIS — I69391 Dysphagia following cerebral infarction: Secondary | ICD-10-CM | POA: Diagnosis not present

## 2017-10-04 DIAGNOSIS — R1312 Dysphagia, oropharyngeal phase: Secondary | ICD-10-CM | POA: Diagnosis not present

## 2017-10-04 DIAGNOSIS — I63312 Cerebral infarction due to thrombosis of left middle cerebral artery: Secondary | ICD-10-CM | POA: Diagnosis not present

## 2017-10-04 DIAGNOSIS — I69851 Hemiplegia and hemiparesis following other cerebrovascular disease affecting right dominant side: Secondary | ICD-10-CM | POA: Diagnosis not present

## 2017-10-05 DIAGNOSIS — I69393 Ataxia following cerebral infarction: Secondary | ICD-10-CM | POA: Diagnosis not present

## 2017-10-05 DIAGNOSIS — I69851 Hemiplegia and hemiparesis following other cerebrovascular disease affecting right dominant side: Secondary | ICD-10-CM | POA: Diagnosis not present

## 2017-10-05 DIAGNOSIS — I69398 Other sequelae of cerebral infarction: Secondary | ICD-10-CM | POA: Diagnosis not present

## 2017-10-05 DIAGNOSIS — I69391 Dysphagia following cerebral infarction: Secondary | ICD-10-CM | POA: Diagnosis not present

## 2017-10-05 DIAGNOSIS — R1312 Dysphagia, oropharyngeal phase: Secondary | ICD-10-CM | POA: Diagnosis not present

## 2017-10-05 DIAGNOSIS — I63312 Cerebral infarction due to thrombosis of left middle cerebral artery: Secondary | ICD-10-CM | POA: Diagnosis not present

## 2017-10-05 DIAGNOSIS — L601 Onycholysis: Secondary | ICD-10-CM | POA: Diagnosis not present

## 2017-10-05 DIAGNOSIS — B351 Tinea unguium: Secondary | ICD-10-CM | POA: Diagnosis not present

## 2017-10-06 ENCOUNTER — Other Ambulatory Visit: Payer: Self-pay

## 2017-10-06 DIAGNOSIS — I69393 Ataxia following cerebral infarction: Secondary | ICD-10-CM | POA: Diagnosis not present

## 2017-10-06 DIAGNOSIS — I63312 Cerebral infarction due to thrombosis of left middle cerebral artery: Secondary | ICD-10-CM | POA: Diagnosis not present

## 2017-10-06 DIAGNOSIS — I69391 Dysphagia following cerebral infarction: Secondary | ICD-10-CM | POA: Diagnosis not present

## 2017-10-06 DIAGNOSIS — I69851 Hemiplegia and hemiparesis following other cerebrovascular disease affecting right dominant side: Secondary | ICD-10-CM | POA: Diagnosis not present

## 2017-10-06 DIAGNOSIS — I69398 Other sequelae of cerebral infarction: Secondary | ICD-10-CM | POA: Diagnosis not present

## 2017-10-06 DIAGNOSIS — R1312 Dysphagia, oropharyngeal phase: Secondary | ICD-10-CM | POA: Diagnosis not present

## 2017-10-06 NOTE — Patient Outreach (Signed)
Telephone outreach to patient to obtain mRs was successfully completed. mRs= 4. 

## 2017-10-07 ENCOUNTER — Non-Acute Institutional Stay (SKILLED_NURSING_FACILITY): Payer: Medicare Other | Admitting: Adult Health

## 2017-10-07 DIAGNOSIS — R1310 Dysphagia, unspecified: Secondary | ICD-10-CM

## 2017-10-07 DIAGNOSIS — W19XXXA Unspecified fall, initial encounter: Secondary | ICD-10-CM | POA: Diagnosis not present

## 2017-10-07 DIAGNOSIS — R5383 Other fatigue: Secondary | ICD-10-CM | POA: Diagnosis not present

## 2017-10-07 NOTE — Progress Notes (Signed)
Location:  Occupational psychologist of Service:  SNF (31) Provider:   Cindi Carbon, [place] [phone]   Josetta Huddle, MD  Patient Care Team: Josetta Huddle, MD as PCP - General (Internal Medicine) Community, [facility], Melinda Green, MD as Consulting Physician (Cardiology) Warden Fillers, MD as Consulting Physician (Ophthalmology) Belva Crome, MD as Consulting Physician (Cardiology) Gayland Curry, DO as Consulting Physician (Geriatric Medicine) Elam Dutch, MD as Consulting Physician (Vascular Surgery) Garvin Fila, MD as Consulting Physician (Neurology)  Extended Emergency Contact Information Primary Emergency Contact: Melinda Noble Address: [address]          [place], [place] [address] Melinda Noble Phone: [phone] Work Phone: [phone] Mobile Phone: [phone] Relation: Spouse Secondary Emergency Contact: Ok Edwards Address: [address]          [place], [place] [address] Melinda Noble Phone: [phone] Relation: Daughter  Code Status:  DNR Goals of care: Advanced Directive information Advanced Directives 09/07/2017  Does Patient Have a Medical Advance Directive? Yes  Type of Advance Directive Out of facility DNR (pink MOST or yellow form);Healthcare Power of Attorney  Does patient want to make changes to medical advance directive? No - Patient declined  Copy of [place] in Chart? Yes  Would patient like information on creating a medical advance directive? -  Pre-existing out of facility DNR order (yellow form or pink MOST form) Pink MOST form placed in chart (order not valid for inpatient use)     Chief Complaint  Patient presents with  . Acute Visit    lethargy, fall, weakness, pocketing food    HPI:  Pt is a 83 y.o. female seen today for an acute visit for lethargy, fall, weakness, and pocketing food. These symptoms  were first noted on Friday Dec 14th. She moved to skilled care permanently this week after CVA which has left her with dysarthria, weakness, and cognitive losses. The staff has noticed the above symptoms but there is no associated cough, fever, or other change in vital signs. She has had issues with agitation at night and ativan was increased to 0.5 mg qhs and neurontin started by psych on Tuesday 12/18.  She is sleeping better and her agitation has improved but there is concern that she is too sleepy during the day with the 12 pm dose. She has had two falls this week with no injury. She has had instances such as this before where she becomes weak and lethargic and shows signs of abd breathing but has recovered each time without intervention. Her husband does not want her hospitalized and her goals of care are comfort based but he would like her treated here in the facility for issues that are reversible.     Past Medical History:  Diagnosis Date  . Anemia   . CAD (coronary artery disease)    a. STEMI 10/09/13 - 100% LAD - following initial angioplasty, ? Spontaneous dissection vs related to ballon. s/p complex PCI with 2 overlapping DES to LAD. Adm complicated by VDRF, cardiogenic shock, peri-MI PAF with NSR on amiodarone. Residual mod RCA disease.   Marland Kitchen CAD S/P percutaneous coronary angioplasty 10/09/13   DES x 2 to Mid LAD, 2.5 x 38, 2.5 x 12 Promus (covering extensive disection), moderate RCA disesase  . Cardiogenic shock ([place])    a. 09/2013 - during admission for STEMI.  . Depression   . Dyslipidemia   .  H/O Acute respiratory failure due to hypoxia 10/10/2013   a. 09/2013 - secondary to anterior STEMI related pulmonary edema (resolved).  . Hypertension   . Hypothyroidism   . Ischemic cardiomyopathy    a. EF 30-35% during 09/2013 adm for STEMI. b. Repeat Echo EF 40-45% with distal LAD hypokinesis (10/16/14 following STEMI recovery).  . Migraine headache    a. Remotely.  . Orthostatic  hypotension    a. Adm 10/2013 for this, felt due to Lasix. Not on regular diuretic due to this/dehydration.  Marland Kitchen PAF (paroxysmal atrial fibrillation) ([place])    a. 09/2013 - peri-MI, NSR at discharge on amiodarone.  . Sinus bradycardia   . Stroke [facility])    Past Surgical History:  Procedure Laterality Date  . CORONARY ANGIOPLASTY WITH STENT PLACEMENT  10/09/13   2 overlapping Promus DES to mid LAD, occlusion/dissection (2.5 x 38, 2.5 x 12)  . FEMORAL ARTERY EXPLORATION Right 06/23/2017   Procedure: repair right femoral pseudoanerysm; evacuation hemotoma;  Surgeon: Elam Dutch, MD;  Location: [facility] OR;  Service: Vascular;  Laterality: Right;  . IR ANGIO INTRA EXTRACRAN SEL COM CAROTID INNOMINATE UNI R MOD SED  06/20/2017  . IR PERCUTANEOUS ART THROMBECTOMY/INFUSION INTRACRANIAL INC DIAG ANGIO  06/20/2017  . IR RADIOLOGIST EVAL & MGMT  09/06/2017  . IR US GUIDE BX ASP/DRAIN  06/23/2017  . LEFT HEART CATHETERIZATION WITH CORONARY ANGIOGRAM N/A 10/09/2013   Procedure: LEFT HEART CATHETERIZATION WITH CORONARY ANGIOGRAM;  Surgeon: Melinda Man, MD;  Location: [facility] CATH LAB;  Service: Cardiovascular;  Laterality: N/A;  . RADIOLOGY WITH ANESTHESIA N/A 06/20/2017   Procedure: RADIOLOGY WITH ANESTHESIA;  Surgeon: Luanne Bras, MD;  Location: [facility] [place];  Service: Radiology;  Laterality: N/A;  . SHOULDER SURGERY      Allergies  Allergen Reactions  . Ace Inhibitors Anaphylaxis  . Beta Adrenergic Blockers Anaphylaxis  . Iohexol Anaphylaxis     Code: HIVES, Desc: anaphylactic shock s/p contrast injection many yrs ago--suggested that pt NEVER have iv contrast//a.c., Onset Date: [id number]   . Atorvastatin Rash  . Latex Rash  . Penicillins Swelling and Rash    Details are unknown  . Sulfa Antibiotics Rash    Outpatient Encounter Medications as of 10/07/2017  Medication Sig  . gabapentin (NEURONTIN) 100 MG capsule Take 100 mg by mouth 2 (two) times daily. 100 mg at 12pm and 200 mg qhs  . lactose free  nutrition (BOOST) LIQD Take 237 mLs by mouth 2 (two) times daily between meals.  . Melatonin 5 MG TABS Take 10 mg by mouth at bedtime.  Marland Kitchen acetaminophen (TYLENOL) 325 MG tablet Take 650 mg by mouth every 6 (six) hours as needed for mild pain.  Marland Kitchen apixaban (ELIQUIS) 5 MG TABS tablet Take 1 tablet (5 mg total) by mouth 2 (two) times daily.  Marland Kitchen aspirin 81 MG chewable tablet Chew 1 tablet (81 mg total) by mouth daily.  Marland Kitchen atropine 1 % ophthalmic solution Place 1 drop under the tongue every 6 (six) hours as needed.  . bisacodyl (DULCOLAX) 10 MG suppository Place 10 mg rectally as needed for moderate constipation.  Marland Kitchen buPROPion (WELLBUTRIN SR) 150 MG 12 hr tablet Take 75 mg by mouth every morning.   . calcium-vitamin D (OSCAL WITH D) 500-200 MG-UNIT per tablet Take 1 tablet by mouth daily.   . carvedilol (COREG) 6.25 MG tablet Take 1 tablet (6.25 mg total) by mouth 2 (two) times daily with a meal.  . cholecalciferol (VITAMIN D) 1000 UNITS tablet Take  1,000 Units by mouth daily.  . clobetasol ointment (TEMOVATE) 5.00 % Apply 1 application topically 2 (two) times daily as needed. (affected areas)--also thighs  . cloNIDine (CATAPRES) 0.1 MG tablet Take 0.1 mg by mouth every 6 (six) hours as needed. SBP >160  . escitalopram (LEXAPRO) 10 MG tablet Take 10 mg daily by mouth.  . fexofenadine (ALLEGRA) 60 MG tablet Take 60 mg by mouth 2 (two) times daily as needed for allergies or rhinitis.  . furosemide (LASIX) 20 MG tablet Take 20 mg by mouth daily.   . hydrOXYzine (ATARAX/VISTARIL) 25 MG tablet Take 25 mg by mouth daily as needed.  Marland Kitchen LORazepam (ATIVAN) 0.5 MG tablet Take 0.5 mg by mouth at bedtime. And q 4 hrs prn  . magnesium hydroxide (MILK OF MAGNESIA) 400 MG/5ML suspension Take 45 mLs by mouth daily as needed for mild constipation.  . montelukast (SINGULAIR) 10 MG tablet Take 10 mg by mouth at bedtime.  Marland Kitchen morphine (ROXANOL) 20 MG/ML concentrated solution Take 5 mg by mouth every 4 (four) hours as needed for  severe pain.  . Multiple Vitamin (MULTIVITAMIN WITH MINERALS) TABS tablet Take 1 tablet by mouth daily.  . nitroGLYCERIN (NITROSTAT) 0.4 MG SL tablet Place 0.4 mg under the tongue every 5 (five) minutes as needed for chest pain.  Marland Kitchen omeprazole (PRILOSEC) 20 MG capsule Take 20 mg by mouth daily.   [name] Glycol-Propyl Glycol (SYSTANE OP) Apply 1 drop to eye daily as needed (dry eyes).  . polyethylene glycol (MIRALAX / GLYCOLAX) packet Take 17 g by mouth daily.  . potassium chloride (K-DUR) 10 MEQ tablet Take 10 mEq by mouth daily.  . rosuvastatin (CRESTOR) 10 MG tablet Take 1 tablet (10 mg total) by mouth 3 (three) times a week. (Mondays, Wednesdays, and Fridays)  . sennosides-docusate sodium (SENOKOT-S) 8.6-50 MG tablet Take 2 tablets by mouth at bedtime.  Marland Kitchen SYNTHROID 100 MCG tablet TAKE 1 TABLET (137mcg) IN THE MORNING ON AN EMPTY STOMACH.  . [DISCONTINUED] Melatonin 5 MG TABS Take 1 tablet by mouth at bedtime.   . [DISCONTINUED] Protein (UNJURY PO) Take 0.5 scoop by mouth 2 (two) times daily.   Facility-Administered Encounter Medications as of 10/07/2017  Medication  . etomidate (AMIDATE) injection  . rocuronium (ZEMURON) injection  . succinylcholine (ANECTINE) injection    Review of Systems  Unable to perform ROS: Dementia    Immunization History  Administered Date(s) Administered  . Influenza Split 08/06/2010, 08/05/2011, 07/26/2012  . Influenza,inj,Quad PF,6+ Mos 08/20/2016  . Influenza-Unspecified 08/12/2017  . Pneumococcal Conjugate-13 04/04/2014   Pertinent  Health Maintenance Due  Topic Date Due  . DEXA SCAN  10/16/1997  . PNA vac Low Risk Adult (2 of 2 - PPSV23) 04/05/2015  . INFLUENZA VACCINE  Completed   Fall Risk  01/20/2017 09/16/2016 07/29/2016  Falls in the past year? No No No   Functional Status Survey:    Vitals:   10/07/17 0929  BP: 106/71  Pulse: 80  Resp: 18  Temp: (!) 97.1 F (36.2 C)  SpO2: 94%   There is no height or weight on file to  calculate BMI. Physical Exam  Constitutional: No distress.  HENT:  Head: Normocephalic and atraumatic.  Nose: Nose normal.  Mouth/Throat: Oropharynx is clear and moist. No oropharyngeal exudate.  Eyes: Conjunctivae are normal. Pupils are equal, round, and reactive to light. Right eye exhibits no discharge. Left eye exhibits no discharge.  Neck: No JVD present.  Cardiovascular: Normal rate and regular rhythm.  No  murmur heard. BLE edema +1  Pulmonary/Chest: Effort normal and breath sounds normal. She has no wheezes.  abd breathing noted, does not appear in distress  Abdominal: Soft. Bowel sounds are normal. She exhibits no distension. There is no tenderness.  Musculoskeletal: Normal range of motion. She exhibits no edema or tenderness.  Lymphadenopathy:    She has no cervical adenopathy.  Neurological:  Sleepy but arouses with verbal stim, only oriented to her self. Equal grip and symmetric face. Did have trouble following commands.   Skin: Skin is warm and dry. She is not diaphoretic.  Psychiatric: She has a normal mood and affect.  Vitals reviewed.   Labs reviewed: Recent Labs    06/27/17 1725 06/28/17 0510 06/28/17 1745 06/29/17 0431 06/30/17 0413 07/02/17 0651 07/03/17 0257 07/13/17 08/23/17 1128 09/16/17  NA 140 142  --  140 141 141 145 140 142 143  K 2.8* 3.5  --  3.5 3.4* 3.0* 3.0* 3.3* 3.5 3.4  CL 104 107  --  107 103 103 104  --   --   --   CO2 28 30  --  29 33* 30 33*  --   --   --   GLUCOSE 104* 142*  --  139* 115* 146* 127*  --   --   --   BUN 28* 34*  --  33* 20 29* 35* 19 17 16   CREATININE 0.55 0.63  --  0.62 0.69 0.82 0.86 0.7 0.6 0.7  CALCIUM 8.7* 9.1  --  9.3 9.5 10.1 10.5*  --   --   --   MG 2.1 1.8  --   --  1.6*  --   --   --   --   --   PHOS 1.9* 1.6* 3.5 2.1* 2.8  --   --   --   --   --    Recent Labs    01/26/17 0855 06/20/17 0819 06/21/17 0349 06/24/17 0410 08/23/17 1128  AST 23 26  --   --  23  ALT 20 23  --   --  25  ALKPHOS 84 72  --    --  95  BILITOT 0.5 0.6  --   --   --   PROT 6.7 6.7  --   --   --   ALBUMIN 4.2 3.9 3.0* 2.7*  --    Recent Labs    06/21/17 0349  06/27/17 0445 06/28/17 0510 06/29/17 0431 07/02/17 0651 07/03/17 0257 07/13/17 08/23/17 1128 09/16/17  WBC 13.8*   < > 10.0 9.1 9.2 9.4 10.9* 6.6 8.6 6.3  NEUTROABS 11.1*  --  6.9 6.1  --   --   --   --   --   --   HGB 11.9*   < > 8.1* 8.2* 8.1* 11.0* 10.9* 12.2 11.5* 11.4*  HCT 37.4   < > 24.9* 26.2* 26.3* 35.1* 35.1* 39 36 37  MCV 94.7   < > 92.6 93.9 95.6 94.9 95.9  --   --   --   PLT 174   < > 172 196 201 256 280 269 284 158   < > = values in this interval not displayed.   Lab Results  Component Value Date   TSH 1.80 08/20/2017   Lab Results  Component Value Date   HGBA1C 5.4 06/21/2017   Lab Results  Component Value Date   CHOL 133 06/21/2017   HDL 45 06/21/2017   LDLCALC 64 06/21/2017  TRIG 99 06/26/2017   CHOLHDL 3.0 06/21/2017    Significant Diagnostic Results in last 30 days:  No results found.  Assessment/Plan  1) Lethargy She has had these episodes before the ativan or neurontin were started which seem to be related to her neurologic condition Due to the falls and lethargy will d/c 12 pm neurontin dose  Continue evening dose of neurontin and ativan for sleep which seem to have helped  2) Dysphagia Has been pocketing food, ST to eval and treat  3) Falls No injury Due to her confusion, weakness, and medications (which are necessary for her treatment of behavioral issues) OT will work with her regarding chair positioning. She has become weaker and required a lift.    Family/ staff Communication: discussed with her husband Mr. Heasley and her nurse  Labs/tests ordered: NA

## 2017-10-08 ENCOUNTER — Encounter: Payer: Self-pay | Admitting: Adult Health

## 2017-10-08 DIAGNOSIS — I63312 Cerebral infarction due to thrombosis of left middle cerebral artery: Secondary | ICD-10-CM | POA: Diagnosis not present

## 2017-10-08 DIAGNOSIS — I69391 Dysphagia following cerebral infarction: Secondary | ICD-10-CM | POA: Diagnosis not present

## 2017-10-08 DIAGNOSIS — R1312 Dysphagia, oropharyngeal phase: Secondary | ICD-10-CM | POA: Diagnosis not present

## 2017-10-08 DIAGNOSIS — I69398 Other sequelae of cerebral infarction: Secondary | ICD-10-CM | POA: Diagnosis not present

## 2017-10-08 DIAGNOSIS — I69393 Ataxia following cerebral infarction: Secondary | ICD-10-CM | POA: Diagnosis not present

## 2017-10-08 DIAGNOSIS — I69851 Hemiplegia and hemiparesis following other cerebrovascular disease affecting right dominant side: Secondary | ICD-10-CM | POA: Diagnosis not present

## 2017-10-10 DIAGNOSIS — I69851 Hemiplegia and hemiparesis following other cerebrovascular disease affecting right dominant side: Secondary | ICD-10-CM | POA: Diagnosis not present

## 2017-10-10 DIAGNOSIS — R1312 Dysphagia, oropharyngeal phase: Secondary | ICD-10-CM | POA: Diagnosis not present

## 2017-10-10 DIAGNOSIS — I69393 Ataxia following cerebral infarction: Secondary | ICD-10-CM | POA: Diagnosis not present

## 2017-10-10 DIAGNOSIS — I69391 Dysphagia following cerebral infarction: Secondary | ICD-10-CM | POA: Diagnosis not present

## 2017-10-10 DIAGNOSIS — I63312 Cerebral infarction due to thrombosis of left middle cerebral artery: Secondary | ICD-10-CM | POA: Diagnosis not present

## 2017-10-10 DIAGNOSIS — I69398 Other sequelae of cerebral infarction: Secondary | ICD-10-CM | POA: Diagnosis not present

## 2017-10-11 DIAGNOSIS — I69391 Dysphagia following cerebral infarction: Secondary | ICD-10-CM | POA: Diagnosis not present

## 2017-10-11 DIAGNOSIS — I69398 Other sequelae of cerebral infarction: Secondary | ICD-10-CM | POA: Diagnosis not present

## 2017-10-11 DIAGNOSIS — R1312 Dysphagia, oropharyngeal phase: Secondary | ICD-10-CM | POA: Diagnosis not present

## 2017-10-11 DIAGNOSIS — I63312 Cerebral infarction due to thrombosis of left middle cerebral artery: Secondary | ICD-10-CM | POA: Diagnosis not present

## 2017-10-11 DIAGNOSIS — I69851 Hemiplegia and hemiparesis following other cerebrovascular disease affecting right dominant side: Secondary | ICD-10-CM | POA: Diagnosis not present

## 2017-10-11 DIAGNOSIS — I69393 Ataxia following cerebral infarction: Secondary | ICD-10-CM | POA: Diagnosis not present

## 2017-10-14 DIAGNOSIS — R1312 Dysphagia, oropharyngeal phase: Secondary | ICD-10-CM | POA: Diagnosis not present

## 2017-10-14 DIAGNOSIS — I69391 Dysphagia following cerebral infarction: Secondary | ICD-10-CM | POA: Diagnosis not present

## 2017-10-14 DIAGNOSIS — I63312 Cerebral infarction due to thrombosis of left middle cerebral artery: Secondary | ICD-10-CM | POA: Diagnosis not present

## 2017-10-14 DIAGNOSIS — I69393 Ataxia following cerebral infarction: Secondary | ICD-10-CM | POA: Diagnosis not present

## 2017-10-14 DIAGNOSIS — I69851 Hemiplegia and hemiparesis following other cerebrovascular disease affecting right dominant side: Secondary | ICD-10-CM | POA: Diagnosis not present

## 2017-10-14 DIAGNOSIS — I69398 Other sequelae of cerebral infarction: Secondary | ICD-10-CM | POA: Diagnosis not present

## 2017-10-18 DIAGNOSIS — R1312 Dysphagia, oropharyngeal phase: Secondary | ICD-10-CM | POA: Diagnosis not present

## 2017-10-18 DIAGNOSIS — I63312 Cerebral infarction due to thrombosis of left middle cerebral artery: Secondary | ICD-10-CM | POA: Diagnosis not present

## 2017-10-18 DIAGNOSIS — I69398 Other sequelae of cerebral infarction: Secondary | ICD-10-CM | POA: Diagnosis not present

## 2017-10-18 DIAGNOSIS — I69391 Dysphagia following cerebral infarction: Secondary | ICD-10-CM | POA: Diagnosis not present

## 2017-10-18 DIAGNOSIS — I69851 Hemiplegia and hemiparesis following other cerebrovascular disease affecting right dominant side: Secondary | ICD-10-CM | POA: Diagnosis not present

## 2017-10-18 DIAGNOSIS — I69393 Ataxia following cerebral infarction: Secondary | ICD-10-CM | POA: Diagnosis not present

## 2017-10-20 DIAGNOSIS — I63312 Cerebral infarction due to thrombosis of left middle cerebral artery: Secondary | ICD-10-CM | POA: Diagnosis not present

## 2017-10-20 DIAGNOSIS — I69391 Dysphagia following cerebral infarction: Secondary | ICD-10-CM | POA: Diagnosis not present

## 2017-10-20 DIAGNOSIS — R1312 Dysphagia, oropharyngeal phase: Secondary | ICD-10-CM | POA: Diagnosis not present

## 2017-10-21 DIAGNOSIS — R1312 Dysphagia, oropharyngeal phase: Secondary | ICD-10-CM | POA: Diagnosis not present

## 2017-10-21 DIAGNOSIS — I63312 Cerebral infarction due to thrombosis of left middle cerebral artery: Secondary | ICD-10-CM | POA: Diagnosis not present

## 2017-10-21 DIAGNOSIS — I69391 Dysphagia following cerebral infarction: Secondary | ICD-10-CM | POA: Diagnosis not present

## 2017-10-22 ENCOUNTER — Encounter: Payer: Self-pay | Admitting: Adult Health

## 2017-10-22 ENCOUNTER — Non-Acute Institutional Stay (SKILLED_NURSING_FACILITY): Payer: Medicare Other | Admitting: Adult Health

## 2017-10-22 DIAGNOSIS — I5022 Chronic systolic (congestive) heart failure: Secondary | ICD-10-CM | POA: Diagnosis not present

## 2017-10-22 DIAGNOSIS — I10 Essential (primary) hypertension: Secondary | ICD-10-CM

## 2017-10-22 DIAGNOSIS — I639 Cerebral infarction, unspecified: Secondary | ICD-10-CM | POA: Diagnosis not present

## 2017-10-22 DIAGNOSIS — F01518 Vascular dementia, unspecified severity, with other behavioral disturbance: Secondary | ICD-10-CM | POA: Insufficient documentation

## 2017-10-22 DIAGNOSIS — R1310 Dysphagia, unspecified: Secondary | ICD-10-CM | POA: Diagnosis not present

## 2017-10-22 DIAGNOSIS — I69391 Dysphagia following cerebral infarction: Secondary | ICD-10-CM | POA: Diagnosis not present

## 2017-10-22 DIAGNOSIS — R1312 Dysphagia, oropharyngeal phase: Secondary | ICD-10-CM | POA: Diagnosis not present

## 2017-10-22 DIAGNOSIS — R4701 Aphasia: Secondary | ICD-10-CM | POA: Diagnosis not present

## 2017-10-22 DIAGNOSIS — F0151 Vascular dementia with behavioral disturbance: Secondary | ICD-10-CM

## 2017-10-22 DIAGNOSIS — I63312 Cerebral infarction due to thrombosis of left middle cerebral artery: Secondary | ICD-10-CM | POA: Diagnosis not present

## 2017-10-22 DIAGNOSIS — R531 Weakness: Secondary | ICD-10-CM | POA: Diagnosis not present

## 2017-10-22 NOTE — Assessment & Plan Note (Signed)
This continues to be an issue and has not improved. She will continue to work with therapy but it does appear she is at baseline now.

## 2017-10-22 NOTE — Assessment & Plan Note (Signed)
Appears to have volume excess. Will increase Lasix to 40 mg qd and increased Kdur to 20 meq qd with no stop date (this has been a reoccuring issue) We will need to watch her electrolytes closely as she is now on thickened liquids and at risk for dehydration. BMP in 1 week

## 2017-10-22 NOTE — Assessment & Plan Note (Signed)
She has had some decline since moving to skilled care with less frequent walks and requiring more assistance. Also issue with dysphagia. Her husband would like to know of PT can get involved again. If not, he would like hire a Clinical research associate. Due to her functional decline I will ask PT to screen and see if they have anything to offer first. I let Mr. Lose know that unfortunately Melinda Noble has reached a plateau and will most likely not improve.  He had been hoping that she would get better and be able to return home him one day and this does not seem to be the likely scenario. We will continue to support her but recognize that her quality of life is poor and that we should avoid any further heroic measures. Her most form reflects no hospitalizations. Her husband would still like her to receive tx for infections as they arise.

## 2017-10-22 NOTE — Assessment & Plan Note (Signed)
Her behaviors have improved but continues in the afternoon per the staff. Will add back the lunch time neurontin dose now that she is more alert.

## 2017-10-22 NOTE — Progress Notes (Signed)
Location:  Occupational psychologist of Service:  SNF (31) Provider:   Cindi Carbon, [place] [phone]  Josetta Huddle, MD  Patient Care Team: Josetta Huddle, MD as PCP - General (Internal Medicine) Community, [facility], Leonie Green, MD as Consulting Physician (Cardiology) Warden Fillers, MD as Consulting Physician (Ophthalmology) Belva Crome, MD as Consulting Physician (Cardiology) Gayland Curry, DO as Consulting Physician (Geriatric Medicine) Elam Dutch, MD as Consulting Physician (Vascular Surgery) Garvin Fila, MD as Consulting Physician (Neurology)  Extended Emergency Contact Information Primary Emergency Contact: Ulice Dash Address: [address]          [place], [place] [address] Johnnette Litter of [place] Phone: [phone] Work Phone: [phone] Mobile Phone: [phone] Relation: Spouse Secondary Emergency Contact: Ok Edwards Address: [address]          [place], [place] [address] Johnnette Litter of [place] Phone: [phone] Relation: Daughter  Code Status:  DNR Goals of care: Advanced Directive information Advanced Directives 09/07/2017  Does Patient Have a Medical Advance Directive? Yes  Type of Advance Directive Out of facility DNR (pink MOST or yellow form);Healthcare Power of Attorney  Does patient want to make changes to medical advance directive? No - Patient declined  Copy of [place] in Chart? Yes  Would patient like information on creating a medical advance directive? -  Pre-existing out of facility DNR order (yellow form or pink MOST form) Pink MOST form placed in chart (order not valid for inpatient use)     Chief Complaint  Patient presents with  . Medical Management of Chronic Issues    HPI:  Pt is a 84 y.o. female seen today for medical management of chronic diseases.  She recently moved to skilled care after a prolonged stay in  rehab due to recurrent CVAs.  She is now minimally ambulatory and at times needs a hoyer lift for transfers. The staff report that at times she is able to walk short distances with a walker and other times she is too weak to do so. She has periods of agitation, typically in the late afternoon or night time. She is followed by Dr Casimiro Needle and placed on neurontin which has helped. The lunch time dose was discontinued due to a period of time where she was lethargic and weak. She recovered from this with no intervention. Her husband has had difficulty accepting her poor state of health but has agreed to no aggressive care or hospitalizations. He has requested that I call him to update on her care and prognosis. She is working with [facility] and has shown signs of worsening dysphagia and her diet has been downgraded to D3 with NTL.  Seems to be tolerating this well with no bouts of pna.  Staff report she has increased edema in both legs and a weight gain of 4 lbs in 24 hrs. No sob or cp.  This has been an ongoing issue due to her hx of CHF (EF 40-45%). She has required increased dosing of lasix several times to maintain euvolemia.   Past Medical History:  Diagnosis Date  . Anemia   . CAD (coronary artery disease)    a. STEMI 10/09/13 - 100% LAD - following initial angioplasty, ? Spontaneous dissection vs related to ballon. s/p complex PCI with 2 overlapping DES to LAD. Adm complicated by VDRF, cardiogenic shock, peri-MI PAF with NSR on amiodarone. Residual mod RCA disease.   Marland Kitchen CAD S/P percutaneous  coronary angioplasty 10/09/13   DES x 2 to Mid LAD, 2.5 x 38, 2.5 x 12 Promus (covering extensive disection), moderate RCA disesase  . Cardiogenic shock ([place])    a. 09/2013 - during admission for STEMI.  . Depression   . Dyslipidemia   . H/O Acute respiratory failure due to hypoxia 10/10/2013   a. 09/2013 - secondary to anterior STEMI related pulmonary edema (resolved).  . Hypertension   . Hypothyroidism   . Ischemic  cardiomyopathy    a. EF 30-35% during 09/2013 adm for STEMI. b. Repeat Echo EF 40-45% with distal LAD hypokinesis (10/16/14 following STEMI recovery).  . Migraine headache    a. Remotely.  . Orthostatic hypotension    a. Adm 10/2013 for this, felt due to Lasix. Not on regular diuretic due to this/dehydration.  Marland Kitchen PAF (paroxysmal atrial fibrillation) ([place])    a. 09/2013 - peri-MI, NSR at discharge on amiodarone.  . Sinus bradycardia   . Stroke [facility])    Past Surgical History:  Procedure Laterality Date  . CORONARY ANGIOPLASTY WITH STENT PLACEMENT  10/09/13   2 overlapping Promus DES to mid LAD, occlusion/dissection (2.5 x 38, 2.5 x 12)  . FEMORAL ARTERY EXPLORATION Right 06/23/2017   Procedure: repair right femoral pseudoanerysm; evacuation hemotoma;  Surgeon: Elam Dutch, MD;  Location: [facility] OR;  Service: Vascular;  Laterality: Right;  . IR ANGIO INTRA EXTRACRAN SEL COM CAROTID INNOMINATE UNI R MOD SED  06/20/2017  . IR PERCUTANEOUS ART THROMBECTOMY/INFUSION INTRACRANIAL INC DIAG ANGIO  06/20/2017  . IR RADIOLOGIST EVAL & MGMT  09/06/2017  . IR US GUIDE BX ASP/DRAIN  06/23/2017  . LEFT HEART CATHETERIZATION WITH CORONARY ANGIOGRAM N/A 10/09/2013   Procedure: LEFT HEART CATHETERIZATION WITH CORONARY ANGIOGRAM;  Surgeon: Leonie Man, MD;  Location: [facility] CATH LAB;  Service: Cardiovascular;  Laterality: N/A;  . RADIOLOGY WITH ANESTHESIA N/A 06/20/2017   Procedure: RADIOLOGY WITH ANESTHESIA;  Surgeon: Luanne Bras, MD;  Location: [place];  Service: Radiology;  Laterality: N/A;  . SHOULDER SURGERY      Allergies  Allergen Reactions  . Ace Inhibitors Anaphylaxis  . Beta Adrenergic Blockers Anaphylaxis  . Iohexol Anaphylaxis     Code: HIVES, Desc: anaphylactic shock s/p contrast injection many yrs ago--suggested that pt NEVER have iv contrast//a.c., Onset Date: [id number]   . Atorvastatin Rash  . Latex Rash  . Penicillins Swelling and Rash    Details are unknown  . Sulfa Antibiotics Rash     Outpatient Encounter Medications as of 10/22/2017  Medication Sig  . acetaminophen (TYLENOL) 325 MG tablet Take 650 mg by mouth every 6 (six) hours as needed for mild pain.  Marland Kitchen apixaban (ELIQUIS) 5 MG TABS tablet Take 1 tablet (5 mg total) by mouth 2 (two) times daily.  Marland Kitchen aspirin 81 MG chewable tablet Chew 1 tablet (81 mg total) by mouth daily.  Marland Kitchen atropine 1 % ophthalmic solution Place 1 drop under the tongue every 6 (six) hours as needed.  . bisacodyl (DULCOLAX) 10 MG suppository Place 10 mg rectally as needed for moderate constipation.  Marland Kitchen buPROPion (WELLBUTRIN SR) 150 MG 12 hr tablet Take 75 mg by mouth every morning.   . calcium-vitamin D (OSCAL WITH D) 500-200 MG-UNIT per tablet Take 1 tablet by mouth daily.   . carvedilol (COREG) 6.25 MG tablet Take 1 tablet (6.25 mg total) by mouth 2 (two) times daily with a meal.  . cholecalciferol (VITAMIN D) 1000 UNITS tablet Take 1,000 Units by mouth daily.  Marland Kitchen  clobetasol ointment (TEMOVATE) 3.97 % Apply 1 application topically 2 (two) times daily as needed. (affected areas)--also thighs  . cloNIDine (CATAPRES) 0.1 MG tablet Take 0.1 mg by mouth every 6 (six) hours as needed. SBP >160  . escitalopram (LEXAPRO) 10 MG tablet Take 10 mg daily by mouth.  . fexofenadine (ALLEGRA) 60 MG tablet Take 60 mg by mouth 2 (two) times daily as needed for allergies or rhinitis.  . furosemide (LASIX) 20 MG tablet Take 20 mg by mouth daily.   Marland Kitchen gabapentin (NEURONTIN) 100 MG capsule Take 200 mg by mouth at bedtime. 100 mg at 12pm and 200 mg qhs   . hydrOXYzine (ATARAX/VISTARIL) 25 MG tablet Take 25 mg by mouth daily as needed.  . lactose free nutrition (BOOST) LIQD Take 237 mLs by mouth 2 (two) times daily between meals.  Marland Kitchen LORazepam (ATIVAN) 0.5 MG tablet Take 0.5 mg by mouth at bedtime. And q 4 hrs prn  . magnesium hydroxide (MILK OF MAGNESIA) 400 MG/5ML suspension Take 45 mLs by mouth daily as needed for mild constipation.  . Melatonin 5 MG TABS Take 10 mg by mouth  at bedtime.  . montelukast (SINGULAIR) 10 MG tablet Take 10 mg by mouth at bedtime.  Marland Kitchen morphine (ROXANOL) 20 MG/ML concentrated solution Take 5 mg by mouth every 4 (four) hours as needed for severe pain.  . Multiple Vitamin (MULTIVITAMIN WITH MINERALS) TABS tablet Take 1 tablet by mouth daily.  . nitroGLYCERIN (NITROSTAT) 0.4 MG SL tablet Place 0.4 mg under the tongue every 5 (five) minutes as needed for chest pain.  Marland Kitchen omeprazole (PRILOSEC) 20 MG capsule Take 20 mg by mouth daily.   [name] Glycol-Propyl Glycol (SYSTANE OP) Apply 1 drop to eye daily as needed (dry eyes).  . polyethylene glycol (MIRALAX / GLYCOLAX) packet Take 17 g by mouth daily.  . potassium chloride (K-DUR) 10 MEQ tablet Take 10 mEq by mouth daily.  . rosuvastatin (CRESTOR) 10 MG tablet Take 1 tablet (10 mg total) by mouth 3 (three) times a week. (Mondays, Wednesdays, and Fridays)  . sennosides-docusate sodium (SENOKOT-S) 8.6-50 MG tablet Take 2 tablets by mouth at bedtime.  Marland Kitchen SYNTHROID 100 MCG tablet TAKE 1 TABLET (121mcg) IN THE MORNING ON AN EMPTY STOMACH.   Facility-Administered Encounter Medications as of 10/22/2017  Medication  . etomidate (AMIDATE) injection  . rocuronium (ZEMURON) injection  . succinylcholine (ANECTINE) injection    Review of Systems  Unable to perform ROS: Dementia    Immunization History  Administered Date(s) Administered  . Influenza Split 08/06/2010, 08/05/2011, 07/26/2012  . Influenza,inj,Quad PF,6+ Mos 08/20/2016  . Influenza-Unspecified 08/12/2017  . Pneumococcal Conjugate-13 04/04/2014   Pertinent  Health Maintenance Due  Topic Date Due  . DEXA SCAN  10/16/1997  . PNA vac Low Risk Adult (2 of 2 - PPSV23) 04/05/2015  . INFLUENZA VACCINE  Completed   Fall Risk  01/20/2017 09/16/2016 07/29/2016  Falls in the past year? No No No   Functional Status Survey:    Vitals:   10/22/17 1053  BP: 132/70  Pulse: 88  Resp: 18  Temp: (!) 97.4 F (36.3 C)  SpO2: 96%  Weight: 185 lb  3.2 oz (84 kg)   Body mass index is 30.82 kg/m. Physical Exam  Constitutional: No distress.  HENT:  Head: Normocephalic and atraumatic.  Nose: Nose normal.  Mouth/Throat: Oropharynx is clear and moist. No oropharyngeal exudate.  Eyes: Conjunctivae are normal. Pupils are equal, round, and reactive to light. Right eye exhibits no  discharge. Left eye exhibits no discharge.  Neck: JVD present.  Cardiovascular: Normal rate.  No murmur heard. Irregular BLE edema +2 pitting.  Pulmonary/Chest: Effort normal. No respiratory distress. She has no wheezes. She has rales (fine bibasilar).  Abdominal: Soft. Bowel sounds are normal.  Musculoskeletal: She exhibits no edema or tenderness.  Neurological: She is alert.  Significant word finding issues and expressive aphasia. Able to f/c.  Flat affect. Oriented to self only  Skin: Skin is warm and dry. She is not diaphoretic.  Psychiatric: She has a normal mood and affect.  Vitals reviewed.   Labs reviewed: Recent Labs    06/27/17 1725 06/28/17 0510 06/28/17 1745 06/29/17 0431 06/30/17 0413 07/02/17 0651 07/03/17 0257 07/13/17 08/23/17 1128 09/16/17  NA 140 142  --  140 141 141 145 140 142 143  K 2.8* 3.5  --  3.5 3.4* 3.0* 3.0* 3.3* 3.5 3.4  CL 104 107  --  107 103 103 104  --   --   --   CO2 28 30  --  29 33* 30 33*  --   --   --   GLUCOSE 104* 142*  --  139* 115* 146* 127*  --   --   --   BUN 28* 34*  --  33* 20 29* 35* 19 17 16   CREATININE 0.55 0.63  --  0.62 0.69 0.82 0.86 0.7 0.6 0.7  CALCIUM 8.7* 9.1  --  9.3 9.5 10.1 10.5*  --   --   --   MG 2.1 1.8  --   --  1.6*  --   --   --   --   --   PHOS 1.9* 1.6* 3.5 2.1* 2.8  --   --   --   --   --    Recent Labs    01/26/17 0855 06/20/17 0819 06/21/17 0349 06/24/17 0410 08/23/17 1128  AST 23 26  --   --  23  ALT 20 23  --   --  25  ALKPHOS 84 72  --   --  95  BILITOT 0.5 0.6  --   --   --   PROT 6.7 6.7  --   --   --   ALBUMIN 4.2 3.9 3.0* 2.7*  --    Recent Labs     06/21/17 0349  06/27/17 0445 06/28/17 0510 06/29/17 0431 07/02/17 0651 07/03/17 0257 07/13/17 08/23/17 1128 09/16/17  WBC 13.8*   < > 10.0 9.1 9.2 9.4 10.9* 6.6 8.6 6.3  NEUTROABS 11.1*  --  6.9 6.1  --   --   --   --   --   --   HGB 11.9*   < > 8.1* 8.2* 8.1* 11.0* 10.9* 12.2 11.5* 11.4*  HCT 37.4   < > 24.9* 26.2* 26.3* 35.1* 35.1* 39 36 37  MCV 94.7   < > 92.6 93.9 95.6 94.9 95.9  --   --   --   PLT 174   < > 172 196 201 256 280 269 284 158   < > = values in this interval not displayed.   Lab Results  Component Value Date   TSH 1.80 08/20/2017   Lab Results  Component Value Date   HGBA1C 5.4 06/21/2017   Lab Results  Component Value Date   CHOL 133 06/21/2017   HDL 45 06/21/2017   LDLCALC 64 06/21/2017   TRIG 99 06/26/2017   CHOLHDL 3.0 06/21/2017  Significant Diagnostic Results in last 30 days:  No results found.  Assessment/Plan  Chronic systolic heart failure Appears to have volume excess. Will increase Lasix to 40 mg qd and increased Kdur to 20 meq qd with no stop date (this has been a reoccuring issue) We will need to watch her electrolytes closely as she is now on thickened liquids and at risk for dehydration. 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 She will continue to work with therapy but it does appear she is at baseline now.   HTN (hypertension) Controlled. See above  Vascular dementia with behavior disturbance Her behaviors have improved but continues in the afternoon per the staff. Will add back the lunch time neurontin dose now that she is more alert.  Weakness She has had some decline since moving to skilled care with less frequent walks and requiring more assistance. Also issue with dysphagia. Her husband would like to know of PT can get involved again. If not, he would like hire a Clinical research associate. Due to  her functional decline I will ask PT to screen and see if they have anything to offer first. I let Mr. Hehr know that unfortunately Ms. Able has reached a plateau and will most likely not improve.  He had been hoping that she would get better and be able to return home him one day and this does not seem to be the likely scenario. We will continue to support her but recognize that her quality of life is poor and that we should avoid any further heroic measures. Her most form reflects no hospitalizations. Her husband would still like her to receive tx for infections as they arise.     Family/ staff Communication: discussed with her husband, Mr. Mccumbers, as well as staff Labs/tests ordered:  BMP in 1 week

## 2017-10-22 NOTE — Assessment & Plan Note (Signed)
Continue D3 diet with NTL ST Asp prec

## 2017-10-22 NOTE — Assessment & Plan Note (Signed)
Controlled.  See above 

## 2017-10-25 DIAGNOSIS — I69391 Dysphagia following cerebral infarction: Secondary | ICD-10-CM | POA: Diagnosis not present

## 2017-10-25 DIAGNOSIS — I63312 Cerebral infarction due to thrombosis of left middle cerebral artery: Secondary | ICD-10-CM | POA: Diagnosis not present

## 2017-10-25 DIAGNOSIS — R1312 Dysphagia, oropharyngeal phase: Secondary | ICD-10-CM | POA: Diagnosis not present

## 2017-11-19 DEATH — deceased

## 2018-02-21 ENCOUNTER — Ambulatory Visit: Payer: Medicare Other | Admitting: Neurology
